# Patient Record
Sex: Male | Born: 1935 | Race: White | Hispanic: No | Marital: Married | Smoking: Former smoker
Health system: Southern US, Community
[De-identification: ages and names within clinical notes are randomized; demographics above are authoritative.]

## PROBLEM LIST (undated history)

## (undated) DIAGNOSIS — G8929 Other chronic pain: Secondary | ICD-10-CM

## (undated) DIAGNOSIS — I219 Acute myocardial infarction, unspecified: Secondary | ICD-10-CM

## (undated) DIAGNOSIS — N281 Cyst of kidney, acquired: Secondary | ICD-10-CM

## (undated) DIAGNOSIS — R0602 Shortness of breath: Secondary | ICD-10-CM

## (undated) DIAGNOSIS — N400 Enlarged prostate without lower urinary tract symptoms: Secondary | ICD-10-CM

## (undated) DIAGNOSIS — I639 Cerebral infarction, unspecified: Secondary | ICD-10-CM

## (undated) DIAGNOSIS — I10 Essential (primary) hypertension: Secondary | ICD-10-CM

## (undated) DIAGNOSIS — M1990 Unspecified osteoarthritis, unspecified site: Secondary | ICD-10-CM

## (undated) DIAGNOSIS — F341 Dysthymic disorder: Secondary | ICD-10-CM

## (undated) DIAGNOSIS — I2510 Atherosclerotic heart disease of native coronary artery without angina pectoris: Secondary | ICD-10-CM

## (undated) HISTORY — DX: Cerebral infarction, unspecified: I63.9

## (undated) HISTORY — DX: Acute myocardial infarction, unspecified: I21.9

## (undated) HISTORY — PX: CATARACT EXTRACTION W/ INTRAOCULAR LENS  IMPLANT, BILATERAL: SHX1307

## (undated) HISTORY — PX: OTHER SURGICAL HISTORY: SHX169

## (undated) HISTORY — PX: PERCUTANEOUS CORONARY STENT INTERVENTION (PCI-S): SHX6016

## (undated) HISTORY — PX: HERNIA REPAIR: SHX51

## (undated) HISTORY — DX: Atherosclerotic heart disease of native coronary artery without angina pectoris: I25.10

---

## 2010-08-13 DIAGNOSIS — I259 Chronic ischemic heart disease, unspecified: Secondary | ICD-10-CM

## 2010-08-13 HISTORY — DX: Chronic ischemic heart disease, unspecified: I25.9

## 2014-02-14 DIAGNOSIS — F32A Depression, unspecified: Secondary | ICD-10-CM

## 2014-02-14 DIAGNOSIS — N400 Enlarged prostate without lower urinary tract symptoms: Secondary | ICD-10-CM

## 2014-02-14 DIAGNOSIS — F39 Unspecified mood [affective] disorder: Secondary | ICD-10-CM | POA: Insufficient documentation

## 2014-02-14 DIAGNOSIS — F0153 Vascular dementia, unspecified severity, with mood disturbance: Secondary | ICD-10-CM | POA: Insufficient documentation

## 2014-02-14 DIAGNOSIS — E785 Hyperlipidemia, unspecified: Secondary | ICD-10-CM

## 2014-02-14 HISTORY — DX: Benign prostatic hyperplasia without lower urinary tract symptoms: N40.0

## 2014-02-14 HISTORY — DX: Depression, unspecified: F32.A

## 2014-02-14 HISTORY — DX: Hyperlipidemia, unspecified: E78.5

## 2014-03-02 DIAGNOSIS — N281 Cyst of kidney, acquired: Secondary | ICD-10-CM | POA: Insufficient documentation

## 2014-03-02 DIAGNOSIS — K760 Fatty (change of) liver, not elsewhere classified: Secondary | ICD-10-CM

## 2014-03-02 HISTORY — DX: Fatty (change of) liver, not elsewhere classified: K76.0

## 2014-03-12 DIAGNOSIS — I10 Essential (primary) hypertension: Secondary | ICD-10-CM

## 2014-03-12 DIAGNOSIS — R42 Dizziness and giddiness: Secondary | ICD-10-CM

## 2014-03-12 HISTORY — DX: Essential (primary) hypertension: I10

## 2014-03-12 HISTORY — DX: Dizziness and giddiness: R42

## 2014-03-29 DIAGNOSIS — J309 Allergic rhinitis, unspecified: Secondary | ICD-10-CM

## 2014-03-29 HISTORY — DX: Allergic rhinitis, unspecified: J30.9

## 2016-01-06 DIAGNOSIS — M20099 Other deformity of finger(s), unspecified finger(s): Secondary | ICD-10-CM

## 2016-01-06 HISTORY — DX: Other deformity of finger(s), unspecified finger(s): M20.099

## 2016-07-15 DIAGNOSIS — M2550 Pain in unspecified joint: Secondary | ICD-10-CM

## 2016-07-15 DIAGNOSIS — M720 Palmar fascial fibromatosis [Dupuytren]: Secondary | ICD-10-CM

## 2016-07-15 HISTORY — DX: Palmar fascial fibromatosis (dupuytren): M72.0

## 2016-07-15 HISTORY — DX: Pain in unspecified joint: M25.50

## 2016-08-07 DIAGNOSIS — D0361 Melanoma in situ of right upper limb, including shoulder: Secondary | ICD-10-CM

## 2016-08-07 HISTORY — DX: Melanoma in situ of right upper limb, including shoulder: D03.61

## 2016-08-18 DIAGNOSIS — K219 Gastro-esophageal reflux disease without esophagitis: Secondary | ICD-10-CM

## 2016-08-18 HISTORY — DX: Gastro-esophageal reflux disease without esophagitis: K21.9

## 2016-10-01 DIAGNOSIS — Z9181 History of falling: Secondary | ICD-10-CM

## 2016-10-01 HISTORY — DX: History of falling: Z91.81

## 2017-09-01 DIAGNOSIS — G8929 Other chronic pain: Secondary | ICD-10-CM

## 2017-09-01 DIAGNOSIS — M6281 Muscle weakness (generalized): Secondary | ICD-10-CM | POA: Insufficient documentation

## 2017-09-01 HISTORY — DX: Other chronic pain: G89.29

## 2017-09-01 HISTORY — DX: Muscle weakness (generalized): M62.81

## 2017-11-23 DIAGNOSIS — G5793 Unspecified mononeuropathy of bilateral lower limbs: Secondary | ICD-10-CM

## 2017-11-23 HISTORY — DX: Unspecified mononeuropathy of bilateral lower limbs: G57.93

## 2018-09-14 DIAGNOSIS — E7800 Pure hypercholesterolemia, unspecified: Secondary | ICD-10-CM

## 2018-09-14 HISTORY — DX: Pure hypercholesterolemia, unspecified: E78.00

## 2018-11-02 DIAGNOSIS — N401 Enlarged prostate with lower urinary tract symptoms: Secondary | ICD-10-CM | POA: Insufficient documentation

## 2018-11-02 HISTORY — DX: Benign prostatic hyperplasia with lower urinary tract symptoms: N40.1

## 2019-08-26 ENCOUNTER — Encounter (HOSPITAL_COMMUNITY): Payer: Self-pay | Admitting: Primary Care

## 2019-08-26 ENCOUNTER — Other Ambulatory Visit: Payer: Self-pay

## 2019-08-26 ENCOUNTER — Observation Stay (HOSPITAL_COMMUNITY): Payer: Medicare HMO

## 2019-08-26 ENCOUNTER — Emergency Department (HOSPITAL_COMMUNITY): Payer: Medicare HMO

## 2019-08-26 ENCOUNTER — Inpatient Hospital Stay (HOSPITAL_COMMUNITY)
Admission: EM | Admit: 2019-08-26 | Discharge: 2019-08-28 | DRG: 041 | Disposition: A | Discharge status: Home Health | Payer: Medicare HMO | Attending: Internal Medicine | Admitting: Internal Medicine

## 2019-08-26 DIAGNOSIS — Z955 Presence of coronary angioplasty implant and graft: Secondary | ICD-10-CM

## 2019-08-26 DIAGNOSIS — R4781 Slurred speech: Secondary | ICD-10-CM | POA: Diagnosis present

## 2019-08-26 DIAGNOSIS — I63412 Cerebral infarction due to embolism of left middle cerebral artery: Secondary | ICD-10-CM

## 2019-08-26 DIAGNOSIS — Z79899 Other long term (current) drug therapy: Secondary | ICD-10-CM

## 2019-08-26 DIAGNOSIS — R202 Paresthesia of skin: Secondary | ICD-10-CM | POA: Diagnosis present

## 2019-08-26 DIAGNOSIS — M1990 Unspecified osteoarthritis, unspecified site: Secondary | ICD-10-CM | POA: Diagnosis present

## 2019-08-26 DIAGNOSIS — I69359 Hemiplegia and hemiparesis following cerebral infarction affecting unspecified side: Secondary | ICD-10-CM

## 2019-08-26 DIAGNOSIS — I6381 Other cerebral infarction due to occlusion or stenosis of small artery: Principal | ICD-10-CM | POA: Diagnosis present

## 2019-08-26 DIAGNOSIS — M25561 Pain in right knee: Secondary | ICD-10-CM | POA: Diagnosis present

## 2019-08-26 DIAGNOSIS — E669 Obesity, unspecified: Secondary | ICD-10-CM | POA: Diagnosis present

## 2019-08-26 DIAGNOSIS — I2510 Atherosclerotic heart disease of native coronary artery without angina pectoris: Secondary | ICD-10-CM | POA: Diagnosis present

## 2019-08-26 DIAGNOSIS — E785 Hyperlipidemia, unspecified: Secondary | ICD-10-CM | POA: Diagnosis present

## 2019-08-26 DIAGNOSIS — I639 Cerebral infarction, unspecified: Secondary | ICD-10-CM | POA: Diagnosis not present

## 2019-08-26 DIAGNOSIS — N400 Enlarged prostate without lower urinary tract symptoms: Secondary | ICD-10-CM | POA: Diagnosis present

## 2019-08-26 DIAGNOSIS — Z7982 Long term (current) use of aspirin: Secondary | ICD-10-CM

## 2019-08-26 DIAGNOSIS — R29702 NIHSS score 2: Secondary | ICD-10-CM | POA: Diagnosis present

## 2019-08-26 DIAGNOSIS — G8929 Other chronic pain: Secondary | ICD-10-CM | POA: Diagnosis present

## 2019-08-26 DIAGNOSIS — Z6830 Body mass index (BMI) 30.0-30.9, adult: Secondary | ICD-10-CM

## 2019-08-26 DIAGNOSIS — Z20828 Contact with and (suspected) exposure to other viral communicable diseases: Secondary | ICD-10-CM | POA: Diagnosis present

## 2019-08-26 DIAGNOSIS — Z823 Family history of stroke: Secondary | ICD-10-CM

## 2019-08-26 DIAGNOSIS — M25562 Pain in left knee: Secondary | ICD-10-CM | POA: Diagnosis present

## 2019-08-26 DIAGNOSIS — I10 Essential (primary) hypertension: Secondary | ICD-10-CM | POA: Diagnosis present

## 2019-08-26 HISTORY — DX: Other chronic pain: G89.29

## 2019-08-26 HISTORY — DX: Unspecified osteoarthritis, unspecified site: M19.90

## 2019-08-26 HISTORY — DX: Essential (primary) hypertension: I10

## 2019-08-26 HISTORY — DX: Benign prostatic hyperplasia without lower urinary tract symptoms: N40.0

## 2019-08-26 HISTORY — DX: Cyst of kidney, acquired: N28.1

## 2019-08-26 HISTORY — DX: Dysthymic disorder: F34.1

## 2019-08-26 LAB — DIFFERENTIAL
Abs Immature Granulocytes: 0.02 10*3/uL (ref 0.00–0.07)
Basophils Absolute: 0 10*3/uL (ref 0.0–0.1)
Basophils Relative: 1 %
Eosinophils Absolute: 0.1 10*3/uL (ref 0.0–0.5)
Eosinophils Relative: 2 %
Immature Granulocytes: 0 %
Lymphocytes Relative: 32 %
Lymphs Abs: 1.9 10*3/uL (ref 0.7–4.0)
Monocytes Absolute: 0.6 10*3/uL (ref 0.1–1.0)
Monocytes Relative: 9 %
Neutro Abs: 3.4 10*3/uL (ref 1.7–7.7)
Neutrophils Relative %: 56 %

## 2019-08-26 LAB — I-STAT CHEM 8, ED
BUN: 16 mg/dL (ref 8–23)
Calcium, Ion: 1.05 mmol/L — ABNORMAL LOW (ref 1.15–1.40)
Chloride: 107 mmol/L (ref 98–111)
Creatinine, Ser: 0.9 mg/dL (ref 0.61–1.24)
Glucose, Bld: 112 mg/dL — ABNORMAL HIGH (ref 70–99)
HCT: 41 % (ref 39.0–52.0)
Hemoglobin: 13.9 g/dL (ref 13.0–17.0)
Potassium: 4 mmol/L (ref 3.5–5.1)
Sodium: 142 mmol/L (ref 135–145)
TCO2: 23 mmol/L (ref 22–32)

## 2019-08-26 LAB — URINALYSIS, ROUTINE W REFLEX MICROSCOPIC
Bilirubin Urine: NEGATIVE
Glucose, UA: NEGATIVE mg/dL
Hgb urine dipstick: NEGATIVE
Ketones, ur: NEGATIVE mg/dL
Leukocytes,Ua: NEGATIVE
Nitrite: NEGATIVE
Protein, ur: NEGATIVE mg/dL
Specific Gravity, Urine: 1.006 (ref 1.005–1.030)
pH: 7 (ref 5.0–8.0)

## 2019-08-26 LAB — COMPREHENSIVE METABOLIC PANEL
ALT: 17 U/L (ref 0–44)
AST: 24 U/L (ref 15–41)
Albumin: 3.7 g/dL (ref 3.5–5.0)
Alkaline Phosphatase: 69 U/L (ref 38–126)
Anion gap: 9 (ref 5–15)
BUN: 14 mg/dL (ref 8–23)
CO2: 25 mmol/L (ref 22–32)
Calcium: 8.7 mg/dL — ABNORMAL LOW (ref 8.9–10.3)
Chloride: 107 mmol/L (ref 98–111)
Creatinine, Ser: 1.01 mg/dL (ref 0.61–1.24)
GFR calc Af Amer: >60 mL/min (ref >60)
GFR calc non Af Amer: >60 mL/min (ref >60)
Glucose, Bld: 116 mg/dL — ABNORMAL HIGH (ref 70–99)
Potassium: 4.1 mmol/L (ref 3.5–5.1)
Sodium: 141 mmol/L (ref 135–145)
Total Bilirubin: 1.1 mg/dL (ref 0.3–1.2)
Total Protein: 6.1 g/dL — ABNORMAL LOW (ref 6.5–8.1)

## 2019-08-26 LAB — SARS CORONAVIRUS 2 (TAT 6-24 HRS): SARS Coronavirus 2: NEGATIVE

## 2019-08-26 LAB — RAPID URINE DRUG SCREEN, HOSP PERFORMED
Amphetamines: NOT DETECTED
Barbiturates: NOT DETECTED
Benzodiazepines: NOT DETECTED
Cocaine: NOT DETECTED
Opiates: NOT DETECTED
Tetrahydrocannabinol: NOT DETECTED

## 2019-08-26 LAB — CBC
HCT: 40.9 % (ref 39.0–52.0)
Hemoglobin: 14.2 g/dL (ref 13.0–17.0)
MCH: 32.1 pg (ref 26.0–34.0)
MCHC: 34.7 g/dL (ref 30.0–36.0)
MCV: 92.5 fL (ref 80.0–100.0)
Platelets: 193 10*3/uL (ref 150–400)
RBC: 4.42 MIL/uL (ref 4.22–5.81)
RDW: 13.6 % (ref 11.5–15.5)
WBC: 6 10*3/uL (ref 4.0–10.5)
nRBC: 0 % (ref 0.0–0.2)

## 2019-08-26 LAB — APTT: aPTT: 29 seconds (ref 24–36)

## 2019-08-26 LAB — CBG MONITORING, ED: Glucose-Capillary: 104 mg/dL — ABNORMAL HIGH (ref 70–99)

## 2019-08-26 LAB — ETHANOL: Alcohol, Ethyl (B): <10 mg/dL (ref <10)

## 2019-08-26 LAB — PROTIME-INR
INR: 1.2 (ref 0.8–1.2)
Prothrombin Time: 15.1 seconds (ref 11.4–15.2)

## 2019-08-26 MED ORDER — ATORVASTATIN CALCIUM 80 MG PO TABS
80.0000 mg | ORAL_TABLET | Freq: Every day | ORAL | Status: DC
  Administered 2019-08-26: 23:00:00 80 mg via ORAL
  Filled 2019-08-26: qty 1

## 2019-08-26 MED ORDER — STROKE: EARLY STAGES OF RECOVERY BOOK
Freq: Once | Status: AC
  Administered 2019-08-26: 23:00:00
  Filled 2019-08-26: qty 1

## 2019-08-26 MED ORDER — ACETAMINOPHEN 325 MG PO TABS
650.0000 mg | ORAL_TABLET | ORAL | Status: DC | PRN
  Administered 2019-08-28: 650 mg via ORAL
  Filled 2019-08-26: qty 2

## 2019-08-26 MED ORDER — TAMSULOSIN HCL 0.4 MG PO CAPS
0.4000 mg | ORAL_CAPSULE | Freq: Every day | ORAL | Status: DC
  Administered 2019-08-27 – 2019-08-28 (×2): 0.4 mg via ORAL
  Filled 2019-08-26 (×2): qty 1

## 2019-08-26 MED ORDER — TRAZODONE HCL 50 MG PO TABS
50.0000 mg | ORAL_TABLET | Freq: Once | ORAL | Status: AC
  Administered 2019-08-26: 23:00:00 50 mg via ORAL
  Filled 2019-08-26: qty 1

## 2019-08-26 MED ORDER — ACETAMINOPHEN 160 MG/5ML PO SOLN: 650.0000 mg | ORAL | Status: DC | PRN

## 2019-08-26 MED ORDER — ASPIRIN 325 MG PO TABS
325.0000 mg | ORAL_TABLET | Freq: Every day | ORAL | Status: DC
  Administered 2019-08-26 – 2019-08-27 (×2): 325 mg via ORAL
  Filled 2019-08-26 (×2): qty 1

## 2019-08-26 MED ORDER — IOHEXOL 350 MG/ML SOLN
100.0000 mL | Freq: Once | INTRAVENOUS | Status: AC | PRN
  Administered 2019-08-26: 13:00:00 100 mL via INTRAVENOUS

## 2019-08-26 MED ORDER — FINASTERIDE 5 MG PO TABS
5.0000 mg | ORAL_TABLET | Freq: Every day | ORAL | Status: DC
  Administered 2019-08-27 – 2019-08-28 (×2): 5 mg via ORAL
  Filled 2019-08-26 (×2): qty 1

## 2019-08-26 MED ORDER — ENOXAPARIN SODIUM 40 MG/0.4ML ~~LOC~~ SOLN
40.0000 mg | SUBCUTANEOUS | Status: DC
  Administered 2019-08-27: 40 mg via SUBCUTANEOUS
  Filled 2019-08-26: qty 0.4

## 2019-08-26 MED ORDER — CLOPIDOGREL BISULFATE 75 MG PO TABS
75.0000 mg | ORAL_TABLET | Freq: Every day | ORAL | Status: DC
  Administered 2019-08-26 – 2019-08-28 (×3): 75 mg via ORAL
  Filled 2019-08-26 (×3): qty 1

## 2019-08-26 MED ORDER — SENNOSIDES-DOCUSATE SODIUM 8.6-50 MG PO TABS: 1.0000 | ORAL_TABLET | Freq: Every evening | ORAL | Status: DC | PRN

## 2019-08-26 MED ORDER — METOPROLOL TARTRATE 12.5 MG HALF TABLET
12.5000 mg | ORAL_TABLET | Freq: Two times a day (BID) | ORAL | Status: DC
  Administered 2019-08-26 – 2019-08-28 (×4): 12.5 mg via ORAL
  Filled 2019-08-26 (×4): qty 1

## 2019-08-26 MED ORDER — ACETAMINOPHEN 650 MG RE SUPP: 650.0000 mg | RECTAL | Status: DC | PRN

## 2019-08-26 MED ORDER — PANTOPRAZOLE SODIUM 40 MG PO TBEC
40.0000 mg | DELAYED_RELEASE_TABLET | Freq: Every day | ORAL | Status: DC
  Administered 2019-08-27 – 2019-08-28 (×2): 40 mg via ORAL
  Filled 2019-08-26 (×2): qty 1

## 2019-08-26 MED ORDER — ASPIRIN 300 MG RE SUPP: 300.0000 mg | Freq: Every day | RECTAL | Status: DC

## 2019-08-26 NOTE — ED Triage Notes (Signed)
Pt LSN at 0100. Pt woke up this morning at 0800 and wife noted difference in speech, slurred. Right side facial droop noted. Pt alert and oriented X4.

## 2019-08-26 NOTE — H&P (Signed)
History and Physical    Yi Haugan ION:[id number] DOB: 1930/10/22 DOA: 08/26/2019  PCP: System, Pcp Not In  Patient coming from:  Home  I have personally briefly reviewed patient's old medical records in [place]  Chief Complaint: Slurred speech and right facial droop started this morning.  HPI: Marvon Shillingburg is a 85 y.o. male with medical history significant of hypertension, dysthymia, coronary artery disease status post stents, hyperlipidemia, BPH, brought by EMS to emergency department for the concern of slurred speech and right facial droop which started suddenly this morning at 8:00.  Patient reports that he was doing fine last night and when he woke up this morning he was having right facial droop, slurred speech denies association with headache, blurry vision, lightheadedness, dizziness, numbness weakness tingling sensation in extremities, previous history of stroke/TIA, chest pain, shortness of breath, palpitation, leg swelling, nausea, vomiting, abdominal pain, urinary or bowel problems.  He lives with his wife at home.  Does not use walker or cane for ambulation.  He is independent on daily life activities.  He denies smoking, alcohol, illicit drug use.  ED Course: CT angiogram of head and neck was obtained in the ED which came back negative for acute findings.  MRI brain without contrast ordered which is pending.  Basic labs such as CBC, CMP, all came back within normal limits.  UA, UDS: Negative, ethanol level: WNL.  Neurology was consulted by EDP for further evaluation and management.  Review of Systems: As per HPI otherwise negative.    Past Medical History:  Diagnosis Date  . Arthritis   . Bilateral chronic knee pain   . BPH (benign prostatic hyperplasia)   . Dysthymia   . HTN (hypertension)   . Renal cyst     Past Surgical History:  Procedure Laterality Date  . PERCUTANEOUS CORONARY STENT INTERVENTION (PCI-S)       has no history on file for tobacco,  alcohol, and drug.  No Known Allergies  Family History  Problem Relation Age of Onset  . Diabetes Mother   . Stroke Father     Prior to Admission medications   Medication Sig Start Date End Date Taking? Authorizing Provider  acetaminophen (TYLENOL) 650 MG CR tablet Take 650 mg by mouth every 8 (eight) hours as needed for pain.   Yes [provider]  aspirin EC 81 MG tablet Take 81 mg by mouth daily.   Yes [provider]  finasteride (PROSCAR) 5 MG tablet Take 5 mg by mouth daily.   Yes [provider]  lisinopril (ZESTRIL) 5 MG tablet Take 5 mg by mouth daily.   Yes [provider]  metoprolol tartrate (LOPRESSOR) 25 MG tablet Take 12.5 mg by mouth 2 (two) times daily.   Yes [provider]  omeprazole (PRILOSEC) 20 MG capsule Take 20 mg by mouth daily.   Yes [provider]  simvastatin (ZOCOR) 40 MG tablet Take 40 mg by mouth daily.   Yes [provider]  tamsulosin (FLOMAX) 0.4 MG CAPS capsule Take 0.4 mg by mouth daily.   Yes [provider]    Physical Exam: Vitals:   08/26/19 1115 08/26/19 1126 08/26/19 1130 08/26/19 1200  BP: (!) 193/92 (!) 193/92 (!) 181/89 (!) 172/77  Pulse: 64 60 (!) 57 63  Resp: 18 20 (!) 21 19  Temp:  97.6 F (36.4 C)    TempSrc:  Oral    SpO2: 96% 97% 96% 95%    Constitutional: NAD, calm,  comfortable Vitals:   08/26/19 1115 08/26/19 1126 08/26/19 1130 08/26/19 1200  BP: (!) 193/92 (!) 193/92 (!) 181/89 (!) 172/77  Pulse: 64 60 (!) 57 63  Resp: 18 20 (!) 21 19  Temp:  97.6 F (36.4 C)    TempSrc:  Oral    SpO2: 96% 97% 96% 95%   Constitutional: Alert and oriented x3, communicating well, tearful, not in acute distress.   Eyes: PERRL, lids and conjunctivae normal ENMT: Mucous membranes are moist. Posterior pharynx clear of any exudate or lesions.Normal dentition.  Neck: normal, supple, no masses, no thyromegaly Respiratory: clear to auscultation bilaterally, no  wheezing, no crackles. Normal respiratory effort. No accessory muscle use.  Cardiovascular: Regular rate and rhythm, no murmurs / rubs / gallops. No extremity edema. 2+ pedal pulses. No carotid bruits.  Abdomen: no tenderness, no masses palpated. No hepatosplenomegaly. Bowel sounds positive.  Musculoskeletal: no clubbing / cyanosis. No joint deformity upper and lower extremities. Good ROM, no contractures. Normal muscle tone.  Skin: no rashes, lesions, ulcers. No induration Neurologic: Right facial droop noted, smile asymmetric, sensation intact, DTR normal. Strength 5/5 in all 4.  Psychiatric: Normal judgment and insight. Alert and oriented x 3.    Labs on Admission: I have personally reviewed following labs and imaging studies  CBC: Recent Labs  Lab 08/26/19 1136 08/26/19 1141  WBC 6.0  --   NEUTROABS 3.4  --   HGB 14.2 13.9  HCT 40.9 41.0  MCV 92.5  --   PLT 193  --    Basic Metabolic Panel: Recent Labs  Lab 08/26/19 1136 08/26/19 1141  NA 141 142  K 4.1 4.0  CL 107 107  CO2 25  --   GLUCOSE 116* 112*  BUN 14 16  CREATININE 1.01 0.90  CALCIUM 8.7*  --    GFR: CrCl cannot be calculated (Unknown ideal weight.). Liver Function Tests: Recent Labs  Lab 08/26/19 1136  AST 24  ALT 17  ALKPHOS 69  BILITOT 1.1  PROT 6.1*  ALBUMIN 3.7   No results for input(s): LIPASE, AMYLASE in the last 168 hours. No results for input(s): AMMONIA in the last 168 hours. Coagulation Profile: Recent Labs  Lab 08/26/19 1136  INR 1.2   Cardiac Enzymes: No results for input(s): CKTOTAL, CKMB, CKMBINDEX, TROPONINI in the last 168 hours. BNP (last 3 results) No results for input(s): PROBNP in the last 8760 hours. HbA1C: No results for input(s): HGBA1C in the last 72 hours. CBG: Recent Labs  Lab 08/26/19 1126  GLUCAP 104*   Lipid Profile: No results for input(s): CHOL, HDL, LDLCALC, TRIG, CHOLHDL, LDLDIRECT in the last 72 hours. Thyroid Function Tests: No results for  input(s): TSH, T4TOTAL, FREET4, T3FREE, THYROIDAB in the last 72 hours. Anemia Panel: No results for input(s): VITAMINB12, FOLATE, FERRITIN, TIBC, IRON, RETICCTPCT in the last 72 hours. Urine analysis:    Component Value Date/Time   COLORURINE STRAW (A) 08/26/2019 1135   APPEARANCEUR CLEAR 08/26/2019 1135   LABSPEC 1.006 08/26/2019 1135   PHURINE 7.0 08/26/2019 1135   GLUCOSEU NEGATIVE 08/26/2019 1135   HGBUR NEGATIVE 08/26/2019 1135   BILIRUBINUR NEGATIVE 08/26/2019 1135   KETONESUR NEGATIVE 08/26/2019 1135   PROTEINUR NEGATIVE 08/26/2019 1135   NITRITE NEGATIVE 08/26/2019 1135   LEUKOCYTESUR NEGATIVE 08/26/2019 1135    Radiological Exams on Admission: Ct Angio Head W Or Wo Contrast  Result Date: 08/26/2019 CLINICAL DATA:  Right facial droop and slurred speech. EXAM: CT ANGIOGRAPHY HEAD AND NECK TECHNIQUE: Multidetector CT  imaging of the head and neck was performed using the standard protocol during bolus administration of intravenous contrast. Multiplanar CT image reconstructions and MIPs were obtained to evaluate the vascular anatomy. Carotid stenosis measurements (when applicable) are obtained utilizing NASCET criteria, using the distal internal carotid diameter as the denominator. CONTRAST:  100mL OMNIPAQUE IOHEXOL 350 MG/ML SOLN COMPARISON:  Head CT 04/09/2012 FINDINGS: CT HEAD FINDINGS Brain: There is no evidence of acute infarct, intracranial hemorrhage, mass, midline shift, or extra-axial fluid collection. There are small chronic infarcts in the left cerebellum. Cerebral white matter hypodensities are nonspecific but compatible with mild chronic small vessel ischemic disease. The ventricles and sulci are within normal limits for age. Vascular: Calcified atherosclerosis at the skull base. No hyperdense vessel. Skull: No fracture or focal osseous lesion. Sinuses: Mild mucosal thickening in the right greater than left maxillary sinuses. Clear mastoid air cells. Orbits: Bilateral cataract  extraction. Review of the MIP images confirms the above findings CTA NECK FINDINGS Aortic arch: Normal variant 4 vessel aortic arch with the left vertebral artery arising from the arch. Mild atherosclerosis without significant arch vessel origin stenosis. Right carotid system: Patent with mild atherosclerotic plaque most notable at the carotid bifurcation. No evidence of significant stenosis or dissection. Tortuous mid to distal cervical ICA. Left carotid system: Patent with mild atherosclerotic plaque most notable at the carotid bifurcation. No evidence of significant stenosis or dissection. Tortuous mid cervical ICA. Vertebral arteries: Patent with the right being slightly larger than the left. Mild calcified plaque at the right vertebral artery origin. No evidence of significant stenosis or dissection. Skeleton: Moderate diffuse cervical disc degeneration. Other neck: No evidence of cervical lymphadenopathy or mass. Upper chest: Mild motion artifact through the lung apices. No consolidation. Review of the MIP images confirms the above findings CTA HEAD FINDINGS Anterior circulation: The internal carotid arteries are patent from skull base to carotid termini with mild atherosclerotic plaque bilaterally not resulting in significant stenosis. ACAs and MCAs are patent with mild irregularity predominantly involving the branch vessels but no evidence of proximal branch occlusion or flow limiting proximal stenosis. There is a mild proximal left M1 stenosis. No aneurysm is identified. Posterior circulation: The intracranial vertebral arteries are patent to the basilar with mild nonstenotic plaque bilaterally. Patent PICA and SCA origins are visualized bilaterally. The basilar artery is widely patent. There is a large left posterior communicating artery with hypoplastic left P1 segment. The PCAs are patent with mild irregularity bilaterally but no significant proximal stenosis. No aneurysm is identified. Venous sinuses:  Patent. Anatomic variants: Fetal left PCA. Review of the MIP images confirms the above findings IMPRESSION: 1. No evidence of acute intracranial abnormality. 2. 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 3. Mild atherosclerosis in the head and neck without large vessel occlusion or significant proximal stenosis. Electronically Signed   By: Sebastian AcheAllen  Grady M.D.   On: 08/26/2019 13:05   Ct Angio Neck W And/or Wo Contrast  Result Date: 08/26/2019 CLINICAL DATA:  Right facial droop and slurred speech. EXAM: CT ANGIOGRAPHY HEAD AND NECK TECHNIQUE: Multidetector CT imaging of the head and neck was performed using the standard protocol during bolus administration of intravenous contrast. Multiplanar CT image reconstructions and MIPs were obtained to evaluate the vascular anatomy. Carotid stenosis measurements (when applicable) are obtained utilizing NASCET criteria, using the distal internal carotid diameter as the denominator. CONTRAST:  100mL OMNIPAQUE IOHEXOL 350 MG/ML SOLN COMPARISON:  Head CT 04/09/2012 FINDINGS: CT HEAD FINDINGS Brain: There  is no evidence of acute infarct, intracranial hemorrhage, mass, midline shift, or extra-axial fluid collection. There are small chronic infarcts in the left cerebellum. Cerebral white matter hypodensities are nonspecific but compatible with mild chronic small vessel ischemic disease. The ventricles and sulci are within normal limits for age. Vascular: Calcified atherosclerosis at the skull base. No hyperdense vessel. Skull: No fracture or focal osseous lesion. Sinuses: Mild mucosal thickening in the right greater than left maxillary sinuses. Clear mastoid air cells. Orbits: Bilateral cataract extraction. Review of the MIP images confirms the above findings CTA NECK FINDINGS Aortic arch: Normal variant 4 vessel aortic arch with the left vertebral artery arising from the arch. Mild atherosclerosis without significant arch vessel origin  stenosis. Right carotid system: Patent with mild atherosclerotic plaque most notable at the carotid bifurcation. No evidence of significant stenosis or dissection. Tortuous mid to distal cervical ICA. Left carotid system: Patent with mild atherosclerotic plaque most notable at the carotid bifurcation. No evidence of significant stenosis or dissection. Tortuous mid cervical ICA. Vertebral arteries: Patent with the right being slightly larger than the left. Mild calcified plaque at the right vertebral artery origin. No evidence of significant stenosis or dissection. Skeleton: Moderate diffuse cervical disc degeneration. Other neck: No evidence of cervical lymphadenopathy or mass. Upper chest: Mild motion artifact through the lung apices. No consolidation. Review of the MIP images confirms the above findings CTA HEAD FINDINGS Anterior circulation: The internal carotid arteries are patent from skull base to carotid termini with mild atherosclerotic plaque bilaterally not resulting in significant stenosis. ACAs and MCAs are patent with mild irregularity predominantly involving the branch vessels but no evidence of proximal branch occlusion or flow limiting proximal stenosis. There is a mild proximal left M1 stenosis. No aneurysm is identified. Posterior circulation: The intracranial vertebral arteries are patent to the basilar with mild nonstenotic plaque bilaterally. Patent PICA and SCA origins are visualized bilaterally. The basilar artery is widely patent. There is a large left posterior communicating artery with hypoplastic left P1 segment. The PCAs are patent with mild irregularity bilaterally but no significant proximal stenosis. No aneurysm is identified. Venous sinuses: Patent. Anatomic variants: Fetal left PCA. Review of the MIP images confirms the above findings IMPRESSION: 1. No evidence of acute intracranial abnormality. 2. 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 3.  Mild atherosclerosis in the head and neck without large vessel occlusion or significant proximal stenosis. Electronically Signed   By: Sebastian AcheAllen  Grady M.D.   On: 08/26/2019 13:05    EKG: Normal sinus rhythm, normal axis, no acute ST-T wave changes noted.  Assessment/Plan Active Problems:   Stroke [facility](HCC)   Essential hypertension   BPH (benign prostatic hyperplasia)   Hyperlipidemia     Stroke/TIA: -Patient presented with slurred speech and right-sided facial droop.  Has history of hypertension and hyperlipidemia. -TPA not given as patient presented outside of window -CT angiogram of head and neck: Negative for acute findings. -Placed patient under observation. -MRI brain without contrast ordered and is pending. -Transthoracic echo and ultrasound carotid ordered and is pending -On telemetry. -Will allow permissive hypertension up to 220/110 for next 24 to 48 hours. -We will keep him n.p.o. until he passes bedside swallow evaluation. -Consulted PT/OT/ST -Seizure, fall, aspiration precautions -Evaluated by neurology-appreciate help -Check A1c and lipid panel.  Started on aspirin 325 mg once daily, Plavix 75 mg once daily and atorvastatin 80 mg once daily.  Coronary artery disease status post stents: Stable -Patient denies ACS symptoms. -  Continue aspirin, statin, hold metoprolol for now.  Hypertension: -We will hold home blood pressure medications at this time to allow permissive hypertension -Monitor blood pressure closely.  Hyperlipidemia: Check lipid panel -Started on atorvastatin 80 mg once daily.  BPH: Stable -Continue home medicines Flomax and Proscar   DVT prophylaxis: Lovenox Code Status: Full code Family Communication: None present at bedside.  Plan of care discussed with patient in length and he verbalized understanding and agreed with it. Disposition Plan: TBD Consults called: Neurology Dr Aroor Admission status: Observation   Ollen Bowlinka R Camey Edell MD [facility] Pager [phone]  If 7PM-7AM, please contact night-coverage [email or web address] Password [email or web address]  08/26/2019, 2:30 PM

## 2019-08-26 NOTE — ED Provider Notes (Signed)
[facility] EMERGENCY DEPARTMENT Provider Note   CSN: [id number] Arrival date & time: 08/26/19  1106     History   Chief Complaint Chief Complaint  Patient presents with  . Code Stroke    HPI Michael Kidd is a 85 y.o. male.     The history is provided by the patient and medical records. No language interpreter was used.   Michael Kidd is a 85 y.o. male who presents to the Emergency Department for evaluation of strokelike symptoms.  Patient states that he went to bed in his usual state of health.  He woke up at around 1 AM to let his dog out and felt fine.  He spoke with and saw his wife at that time who did not notice any speech changes and felt as if he was acting himself.  When he woke up around 8 this morning, he was having right facial tingling and wife noticed facial droop.  She also noticed garbled speech which concerned her.  Patient does endorse difficulty with his speech earlier today, but feels as if this has now resolved.  He does still feel as if the right side of his face is weak and numb.  During transport, EMS reported that he complained of a few seconds of visual changes.  Patient states this too has now resolved.  Past Medical History:  Diagnosis Date  . Arthritis   . Bilateral chronic knee pain   . BPH (benign prostatic hyperplasia)   . Dysthymia   . HTN (hypertension)   . Renal cyst     There are no active problems to display for this patient.      Home Medications    Prior to Admission medications   Medication Sig Start Date End Date Taking? Authorizing Provider  acetaminophen (TYLENOL) 650 MG CR tablet Take 650 mg by mouth every 8 (eight) hours as needed for pain.   Yes [provider]  aspirin EC 81 MG tablet Take 81 mg by mouth daily.   Yes [provider]  finasteride (PROSCAR) 5 MG tablet Take 5 mg by mouth daily.   Yes [provider]  lisinopril (ZESTRIL) 5 MG tablet Take 5 mg by mouth daily.   Yes  [provider]  metoprolol tartrate (LOPRESSOR) 25 MG tablet Take 12.5 mg by mouth 2 (two) times daily.   Yes [provider]  omeprazole (PRILOSEC) 20 MG capsule Take 20 mg by mouth daily.   Yes [provider]  simvastatin (ZOCOR) 40 MG tablet Take 40 mg by mouth daily.   Yes [provider]  tamsulosin (FLOMAX) 0.4 MG CAPS capsule Take 0.4 mg by mouth daily.   Yes [provider]    Family History No family history on file.  Social History Social History   Tobacco Use  . Smoking status: Not on file  Substance Use Topics  . Alcohol use: Not on file  . Drug use: Not on file     Allergies   Patient has no known allergies.   Review of Systems Review of Systems  Neurological: Positive for facial asymmetry, speech difficulty, weakness and numbness (Right face). Negative for dizziness and headaches.  All other systems reviewed and are negative.    Physical Exam Updated Vital Signs BP (!) 172/77   Pulse 63   Temp 97.6 F (36.4 C) (Oral)   Resp 19   SpO2 95%   Physical Exam Vitals signs and nursing note reviewed.  Constitutional:  General: He is not in acute distress.    Appearance: He is well-developed.  HENT:     Head: Normocephalic and atraumatic.  Neck:     Musculoskeletal: Neck supple.  Cardiovascular:     Rate and Rhythm: Normal rate and regular rhythm.     Heart sounds: Normal heart sounds. No murmur.  Pulmonary:     Effort: Pulmonary effort is normal. No respiratory distress.     Breath sounds: Normal breath sounds.  Abdominal:     General: There is no distension.     Palpations: Abdomen is soft.     Tenderness: There is no abdominal tenderness.  Skin:    General: Skin is warm and dry.  Neurological:     Mental Status: He is alert.     Comments: Alert, oriented, thought content appropriate. Speech is clear and goal oriented, able to follow commands.  Cranial Nerves:  II:  Peripheral visual fields  grossly normal, pupils equal, round, reactive to light III, IV, VI: EOM intact bilaterally, ptosis not present V,VII: smile asymmetric, eyes kept closed tightly against resistance, facial light touch sensation equal VIII: hearing grossly normal IX, X: symmetric soft palate movement, uvula elevates symmetrically  XI: bilateral shoulder shrug symmetric and strong XII: midline tongue extension 4/5 muscle strength in the right upper extremity, 5/5 muscle strength in left upper extremity and bilateral lower extremities including strong and equal grip strength and dorsiflexion/plantar flexion Sensory to light touch normal in all four extremities      ED Treatments / Results  Labs (all labs ordered are listed, but only abnormal results are displayed) Labs Reviewed  COMPREHENSIVE METABOLIC PANEL - Abnormal; Notable for the following components:      Result Value   Glucose, Bld 116 (*)    Calcium 8.7 (*)    Total Protein 6.1 (*)    All other components within normal limits  URINALYSIS, ROUTINE W REFLEX MICROSCOPIC - Abnormal; Notable for the following components:   Color, Urine STRAW (*)    All other components within normal limits  I-STAT CHEM 8, ED - Abnormal; Notable for the following components:   Glucose, Bld 112 (*)    Calcium, Ion 1.05 (*)    All other components within normal limits  CBG MONITORING, ED - Abnormal; Notable for the following components:   Glucose-Capillary 104 (*)    All other components within normal limits  ETHANOL  PROTIME-INR  APTT  CBC  DIFFERENTIAL  RAPID URINE DRUG SCREEN, HOSP PERFORMED    EKG None  Radiology Ct Angio Head W Or Wo Contrast  Result Date: 08/26/2019 CLINICAL DATA:  Right facial droop and slurred speech. EXAM: CT ANGIOGRAPHY HEAD AND NECK TECHNIQUE: Multidetector CT imaging of the head and neck was performed using the standard protocol during bolus administration of intravenous contrast. Multiplanar CT image reconstructions and MIPs  were obtained to evaluate the vascular anatomy. Carotid stenosis measurements (when applicable) are obtained utilizing NASCET criteria, using the distal internal carotid diameter as the denominator. CONTRAST:  100mL OMNIPAQUE IOHEXOL 350 MG/ML SOLN COMPARISON:  Head CT 04/09/2012 FINDINGS: CT HEAD FINDINGS Brain: There is no evidence of acute infarct, intracranial hemorrhage, mass, midline shift, or extra-axial fluid collection. There are small chronic infarcts in the left cerebellum. Cerebral white matter hypodensities are nonspecific but compatible with mild chronic small vessel ischemic disease. The ventricles and sulci are within normal limits for age. Vascular: Calcified atherosclerosis at the skull base. No hyperdense vessel. Skull: No fracture or focal osseous  lesion. Sinuses: Mild mucosal thickening in the right greater than left maxillary sinuses. Clear mastoid air cells. Orbits: Bilateral cataract extraction. Review of the MIP images confirms the above findings CTA NECK FINDINGS Aortic arch: Normal variant 4 vessel aortic arch with the left vertebral artery arising from the arch. Mild atherosclerosis without significant arch vessel origin stenosis. Right carotid system: Patent with mild atherosclerotic plaque most notable at the carotid bifurcation. No evidence of significant stenosis or dissection. Tortuous mid to distal cervical ICA. Left carotid system: Patent with mild atherosclerotic plaque most notable at the carotid bifurcation. No evidence of significant stenosis or dissection. Tortuous mid cervical ICA. Vertebral arteries: Patent with the right being slightly larger than the left. Mild calcified plaque at the right vertebral artery origin. No evidence of significant stenosis or dissection. Skeleton: Moderate diffuse cervical disc degeneration. Other neck: No evidence of cervical lymphadenopathy or mass. Upper chest: Mild motion artifact through the lung apices. No consolidation. Review of the MIP  images confirms the above findings CTA HEAD FINDINGS Anterior circulation: The internal carotid arteries are patent from skull base to carotid termini with mild atherosclerotic plaque bilaterally not resulting in significant stenosis. ACAs and MCAs are patent with mild irregularity predominantly involving the branch vessels but no evidence of proximal branch occlusion or flow limiting proximal stenosis. There is a mild proximal left M1 stenosis. No aneurysm is identified. Posterior circulation: The intracranial vertebral arteries are patent to the basilar with mild nonstenotic plaque bilaterally. Patent PICA and SCA origins are visualized bilaterally. The basilar artery is widely patent. There is a large left posterior communicating artery with hypoplastic left P1 segment. The PCAs are patent with mild irregularity bilaterally but no significant proximal stenosis. No aneurysm is identified. Venous sinuses: Patent. Anatomic variants: Fetal left PCA. Review of the MIP images confirms the above findings IMPRESSION: 1. No evidence of acute intracranial abnormality. 2. 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 3. Mild atherosclerosis in the head and neck without large vessel occlusion or significant proximal stenosis. Electronically Signed   By: Logan Bores M.D.   On: 08/26/2019 13:05   Ct Angio Neck W And/or Wo Contrast  Result Date: 08/26/2019 CLINICAL DATA:  Right facial droop and slurred speech. EXAM: CT ANGIOGRAPHY HEAD AND NECK TECHNIQUE: Multidetector CT imaging of the head and neck was performed using the standard protocol during bolus administration of intravenous contrast. Multiplanar CT image reconstructions and MIPs were obtained to evaluate the vascular anatomy. Carotid stenosis measurements (when applicable) are obtained utilizing NASCET criteria, using the distal internal carotid diameter as the denominator. CONTRAST:  138mL OMNIPAQUE IOHEXOL 350 MG/ML SOLN  COMPARISON:  Head CT 04/09/2012 FINDINGS: CT HEAD FINDINGS Brain: There is no evidence of acute infarct, intracranial hemorrhage, mass, midline shift, or extra-axial fluid collection. There are small chronic infarcts in the left cerebellum. Cerebral white matter hypodensities are nonspecific but compatible with mild chronic small vessel ischemic disease. The ventricles and sulci are within normal limits for age. Vascular: Calcified atherosclerosis at the skull base. No hyperdense vessel. Skull: No fracture or focal osseous lesion. Sinuses: Mild mucosal thickening in the right greater than left maxillary sinuses. Clear mastoid air cells. Orbits: Bilateral cataract extraction. Review of the MIP images confirms the above findings CTA NECK FINDINGS Aortic arch: Normal variant 4 vessel aortic arch with the left vertebral artery arising from the arch. Mild atherosclerosis without significant arch vessel origin stenosis. Right carotid system: Patent with mild atherosclerotic plaque most notable at  the carotid bifurcation. No evidence of significant stenosis or dissection. Tortuous mid to distal cervical ICA. Left carotid system: Patent with mild atherosclerotic plaque most notable at the carotid bifurcation. No evidence of significant stenosis or dissection. Tortuous mid cervical ICA. Vertebral arteries: Patent with the right being slightly larger than the left. Mild calcified plaque at the right vertebral artery origin. No evidence of significant stenosis or dissection. Skeleton: Moderate diffuse cervical disc degeneration. Other neck: No evidence of cervical lymphadenopathy or mass. Upper chest: Mild motion artifact through the lung apices. No consolidation. Review of the MIP images confirms the above findings CTA HEAD FINDINGS Anterior circulation: The internal carotid arteries are patent from skull base to carotid termini with mild atherosclerotic plaque bilaterally not resulting in significant stenosis. ACAs and MCAs  are patent with mild irregularity predominantly involving the branch vessels but no evidence of proximal branch occlusion or flow limiting proximal stenosis. There is a mild proximal left M1 stenosis. No aneurysm is identified. Posterior circulation: The intracranial vertebral arteries are patent to the basilar with mild nonstenotic plaque bilaterally. Patent PICA and SCA origins are visualized bilaterally. The basilar artery is widely patent. There is a large left posterior communicating artery with hypoplastic left P1 segment. The PCAs are patent with mild irregularity bilaterally but no significant proximal stenosis. No aneurysm is identified. Venous sinuses: Patent. Anatomic variants: Fetal left PCA. Review of the MIP images confirms the above findings IMPRESSION: 1. No evidence of acute intracranial abnormality. 2. 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 3. Mild atherosclerosis in the head and neck without large vessel occlusion or significant proximal stenosis. Electronically Signed   By: Sebastian AcheAllen  Grady M.D.   On: 08/26/2019 13:05    Procedures Procedures (including critical care time)  Medications Ordered in ED Medications  iohexol (OMNIPAQUE) 350 MG/ML injection 100 mL (100 mLs Intravenous Contrast Given 08/26/19 1247)     Initial Impression / Assessment and Plan / ED Course  I have reviewed the triage vital signs and the nursing notes.  Pertinent labs & imaging results that were available during my care of the patient were reviewed by me and considered in my medical decision making (see chart for details).       Carolynn Commenthomas Pawloski is a [age] y.o. male who presents to ED for difficulty with speech, right facial droop and right upper extremity weakness which he noticed upon awakening this morning.  Last known normal of 1 AM.  Upon arrival to ED, patient does have slight right facial droop and 4/5 muscle strength in the right upper extremity.  His speech is  clear.  He feels as if his speech symptoms have resolved.  Consulted neurology, Dr. Cameron Aliohr, who recommends a stat CT angios head and neck and neuro will evaluate the patient.  Labs reviewed and reassuring.  CT angios without acute intracranial abnormalities. As recommended by neurology, MRI ordered and patient admitted to hospitalist service for further stroke work up.   Patient discussed with Dr. Dalene SeltzerSchlossman who agrees with treatment plan.    Final Clinical Impressions(s) / ED Diagnoses   Final diagnoses:  Acute ischemic stroke [facility](HCC)    ED Discharge Orders    None       Ward, Chase PicketJaime Pilcher, PA-C 08/26/19 1355    Alvira MondaySchlossman, Erin, MD 08/27/19 [phone]

## 2019-08-26 NOTE — Progress Notes (Signed)
Patient arrived from ED via stretcher; oriented patient to room and unit routine; assisted patient to use the phone; fall safety reviewed; patient alert and oriented X4; cooperative.

## 2019-08-26 NOTE — Consult Note (Addendum)
NEURO HOSPITALIST  CONSULT   Requesting Physician: Dr. Billy Fischer    Chief Complaint: slurred speech, right facial droop  History obtained from:  Patient /  Chart    HPI:                                                                                                                                         Michael Kidd is an 85 y.o. male  With PMH HTN, dysthymia, BPH who presented to [facility] ED with c/o facial droop and slurred speech.    Patient states he went to bed early last night. He woke up about 0100 and took the dog out. At that time he felt fine and was completely normal. When he woke up again at 0800 he "didn't feel right". When he went to the bathroom he noticed facial droop. He then went to tell his wife. They called the doctor and they were referred to the emergency department. No prior stroke history noted. Patient does take ASA 81mg  daily. Denies  Anticoagulation, smoking, drinking alcohol or drug abuse. Denies any vision changes, CP, SOB, focal weakness.  ED course:  CTA: no LVO BP: 192/92 BG:112  Date last known well: 08-26-2019 Time last known well: 0100 tPA Given :no outside of window Modified Rankin: Rankin Score=1 NIHSS:2 ( facial droop and right arm drift   Past Medical History:  Diagnosis Date  . Arthritis   . Dysthymia   . HTN (hypertension)   . Renal cyst     The histories are not reviewed yet. Please review them in the "History" navigator section and refresh this [place].  No family history on file.       Social History:  has no history on file for tobacco, alcohol, and drug.  Allergies: No Known Allergies  Medications:                                                                                                                           No current facility-administered medications for this encounter.    Current Outpatient Medications  Medication Sig Dispense Refill  .  acetaminophen (TYLENOL) 650 MG  CR tablet Take 650 mg by mouth every 8 (eight) hours as needed for pain.    Marland Kitchen aspirin EC 81 MG tablet Take 81 mg by mouth daily.    . finasteride (PROSCAR) 5 MG tablet Take 5 mg by mouth daily.    Marland Kitchen lisinopril (ZESTRIL) 5 MG tablet Take 5 mg by mouth daily.    . metoprolol tartrate (LOPRESSOR) 25 MG tablet Take 12.5 mg by mouth 2 (two) times daily.    Marland Kitchen omeprazole (PRILOSEC) 20 MG capsule Take 20 mg by mouth daily.    . simvastatin (ZOCOR) 40 MG tablet Take 40 mg by mouth daily.    . tamsulosin (FLOMAX) 0.4 MG CAPS capsule Take 0.4 mg by mouth daily.      ROS:                                                                                                                                       ROS was performed and is negative except as noted in HPI   General Examination:                                                                                                      Blood pressure (!) 172/77, pulse 63, temperature 97.6 F (36.4 C), temperature source Oral, resp. rate 19, SpO2 95 %.  HEENT-  Normocephalic, no lesions, without obvious abnormality.  Normal external eye and conjunctiva. Cardiovascular-  pulses palpable throughout  Lungs- no excessive working breathing.  Saturations within normal limits on RA Extremities- Warm, dry and intact Musculoskeletal-no joint tenderness, deformity or swelling Skin-warm and dry, no hyperpigmentation, vitiligo, or suspicious lesions  Neurological Examination Mental Status: Alert, oriented, thought content appropriate.   Speech fluent without evidence of aphasia.  Able to follow  commands without difficulty. Cranial Nerves: II: Visual fields grossly normal,  III,IV, VI: ptosis not present, extra-ocular motions intact bilaterally, pupils equal, round, reactive to light and accommodation V,VII: smile asymmetric, right facial droop noted,  facial light touch sensation normal bilaterally VIII: hearing normal bilaterally IX,X: uvula rises midline XI:  bilateral shoulder shrug XII: midline tongue extension Motor: Right : Upper extremity   4+/5  Left:     Upper extremity   5/5  Lower extremity   5/5   Lower extremity   5/5 Tone and bulk:normal tone throughout; no atrophy noted Sensory:  light touch intact throughout, bilaterally Deep Tendon Reflexes: 2+ and symmetric biceps and patella Plantars: Right: downgoing  Left: downgoing Cerebellar: normal finger-to-nose,  Gait: deferred   Lab Results: Basic Metabolic Panel: Recent Labs  Lab 08/26/19 1141  NA 142  K 4.0  CL 107  GLUCOSE 112*  BUN 16  CREATININE 0.90    CBC: Recent Labs  Lab 08/26/19 1136 08/26/19 1141  WBC 6.0  --   NEUTROABS 3.4  --   HGB 14.2 13.9  HCT 40.9 41.0  MCV 92.5  --   PLT 193  --     CBG: Recent Labs  Lab 08/26/19 1126  GLUCAP 104*    Imaging: No results found.     Michael LucksJessica Williams, MSN, NP-C [facility] [phone]  08/26/2019, 12:08 PM   Attending physician note to follow with Assessment and plan .   Assessment: 85 y.o. male With PMH HTN, dysthymia, BPH who [facility] with c/o facial droop and slurred speech. CTA  No LVO, small chronic cerebellar infarcts. TPA not give d/t patient presenting outside of window.   Acute Ischemic Stroke   Stroke Risk Factors - hypertension    Recommendations: -- BP goal : Permissive HTN upto 220/110 mmHg (for 24-48 post admission ) # MRI of the brain without contrast #Transthoracic Echo  # Start patient on ASA 325mg  daily and Plavix 75 mg daily #Start or continue Atorvastatin 80 mg/other high intensity statin # HBAIC and Lipid profile # Telemetry monitoring # Frequent neuro checks # NPO until passes stroke swallow screen  Please page stroke NP  Or  PA  Or MD from 8am -4 pm  as this patient from this time will be  followed by the stroke.   You can look them up on [email or web address]  Password [email or web address]  NEUROHOSPITALIST ADDENDUM Performed a face to face diagnostic  evaluation.   I have reviewed the contents of history and physical exam as documented by PA/ARNP/Resident and agree with above documentation.  I have discussed and formulated the above plan as documented. Edits to the note have been made as needed.  85 year old male with past medical history of hypertension presents the ED with difficulty getting words out, slurred speech and facial droop.  Symptoms mostly resolved and patient only has mild right facial droop and mild upper extremity weakness.  NIHSS 2.  Not candidate for TPA as he is out of the window and mild deficits.  No LVO on CTA.  Recommend stroke work-up and admission.  Stroke team will follow    Sushanth Aroor MD [facility] [phone]   If 7pm to 7am, please call on call as listed on AMION.

## 2019-08-27 ENCOUNTER — Inpatient Hospital Stay (HOSPITAL_COMMUNITY): Payer: Medicare HMO

## 2019-08-27 ENCOUNTER — Encounter (HOSPITAL_COMMUNITY): Payer: Self-pay | Admitting: *Deleted

## 2019-08-27 DIAGNOSIS — I351 Nonrheumatic aortic (valve) insufficiency: Secondary | ICD-10-CM | POA: Diagnosis not present

## 2019-08-27 DIAGNOSIS — I63411 Cerebral infarction due to embolism of right middle cerebral artery: Secondary | ICD-10-CM | POA: Diagnosis not present

## 2019-08-27 DIAGNOSIS — I639 Cerebral infarction, unspecified: Secondary | ICD-10-CM

## 2019-08-27 DIAGNOSIS — M1990 Unspecified osteoarthritis, unspecified site: Secondary | ICD-10-CM | POA: Diagnosis present

## 2019-08-27 DIAGNOSIS — Z79899 Other long term (current) drug therapy: Secondary | ICD-10-CM | POA: Diagnosis not present

## 2019-08-27 DIAGNOSIS — Z7982 Long term (current) use of aspirin: Secondary | ICD-10-CM | POA: Diagnosis not present

## 2019-08-27 DIAGNOSIS — R202 Paresthesia of skin: Secondary | ICD-10-CM | POA: Diagnosis present

## 2019-08-27 DIAGNOSIS — Z955 Presence of coronary angioplasty implant and graft: Secondary | ICD-10-CM | POA: Diagnosis not present

## 2019-08-27 DIAGNOSIS — R29702 NIHSS score 2: Secondary | ICD-10-CM | POA: Diagnosis present

## 2019-08-27 DIAGNOSIS — M25562 Pain in left knee: Secondary | ICD-10-CM | POA: Diagnosis present

## 2019-08-27 DIAGNOSIS — I6381 Other cerebral infarction due to occlusion or stenosis of small artery: Secondary | ICD-10-CM | POA: Diagnosis present

## 2019-08-27 DIAGNOSIS — I6389 Other cerebral infarction: Secondary | ICD-10-CM | POA: Diagnosis not present

## 2019-08-27 DIAGNOSIS — M25561 Pain in right knee: Secondary | ICD-10-CM | POA: Diagnosis present

## 2019-08-27 DIAGNOSIS — N400 Enlarged prostate without lower urinary tract symptoms: Secondary | ICD-10-CM | POA: Diagnosis present

## 2019-08-27 DIAGNOSIS — E669 Obesity, unspecified: Secondary | ICD-10-CM | POA: Diagnosis present

## 2019-08-27 DIAGNOSIS — Z6830 Body mass index (BMI) 30.0-30.9, adult: Secondary | ICD-10-CM | POA: Diagnosis not present

## 2019-08-27 DIAGNOSIS — N401 Enlarged prostate with lower urinary tract symptoms: Secondary | ICD-10-CM | POA: Diagnosis not present

## 2019-08-27 DIAGNOSIS — I10 Essential (primary) hypertension: Secondary | ICD-10-CM | POA: Diagnosis present

## 2019-08-27 DIAGNOSIS — E785 Hyperlipidemia, unspecified: Secondary | ICD-10-CM

## 2019-08-27 DIAGNOSIS — Z823 Family history of stroke: Secondary | ICD-10-CM | POA: Diagnosis not present

## 2019-08-27 DIAGNOSIS — G8929 Other chronic pain: Secondary | ICD-10-CM | POA: Diagnosis present

## 2019-08-27 DIAGNOSIS — R4781 Slurred speech: Secondary | ICD-10-CM | POA: Diagnosis present

## 2019-08-27 DIAGNOSIS — I340 Nonrheumatic mitral (valve) insufficiency: Secondary | ICD-10-CM | POA: Diagnosis not present

## 2019-08-27 DIAGNOSIS — I2510 Atherosclerotic heart disease of native coronary artery without angina pectoris: Secondary | ICD-10-CM | POA: Diagnosis present

## 2019-08-27 DIAGNOSIS — I69359 Hemiplegia and hemiparesis following cerebral infarction affecting unspecified side: Secondary | ICD-10-CM | POA: Diagnosis not present

## 2019-08-27 DIAGNOSIS — Z20828 Contact with and (suspected) exposure to other viral communicable diseases: Secondary | ICD-10-CM | POA: Diagnosis present

## 2019-08-27 LAB — ECHOCARDIOGRAM COMPLETE
Height: 70 in
Weight: 3360 oz

## 2019-08-27 LAB — HEMOGLOBIN A1C
Hgb A1c MFr Bld: 5.8 % — ABNORMAL HIGH (ref 4.8–5.6)
Mean Plasma Glucose: 119.76 mg/dL

## 2019-08-27 LAB — LIPID PANEL
Cholesterol: 124 mg/dL (ref 0–200)
HDL: 30 mg/dL — ABNORMAL LOW (ref >40)
LDL Cholesterol: 71 mg/dL (ref 0–99)
Total CHOL/HDL Ratio: 4.1 RATIO
Triglycerides: 113 mg/dL (ref <150)
VLDL: 23 mg/dL (ref 0–40)

## 2019-08-27 MED ORDER — ASPIRIN EC 81 MG PO TBEC
81.0000 mg | DELAYED_RELEASE_TABLET | Freq: Every day | ORAL | Status: DC
  Administered 2019-08-28: 10:00:00 81 mg via ORAL
  Filled 2019-08-27: qty 1

## 2019-08-27 MED ORDER — ATORVASTATIN CALCIUM 40 MG PO TABS
40.0000 mg | ORAL_TABLET | Freq: Every day | ORAL | Status: DC
  Administered 2019-08-27: 19:00:00 40 mg via ORAL
  Filled 2019-08-27: qty 1

## 2019-08-27 NOTE — Progress Notes (Signed)
  Echocardiogram 2D Echocardiogram has been performed.  Michael Kidd L Androw 08/27/2019, 3:01 PM

## 2019-08-27 NOTE — Evaluation (Signed)
Occupational Therapy Evaluation Patient Details Name: Michael Kidd MRN: [id number] DOB: [birth year] Today's Date: 08/27/2019    History of Present Illness 85 y.o. male with medical history significant of hypertension, dysthymia, coronary artery disease status post stents, hyperlipidemia, BPH, brought by EMS to emergency department for the concern of slurred speech and right facial droop.   Clinical Impression   Pt admitted with above. He demonstrates the below listed deficits and will benefit from continued OT to maximize safety and independence with BADLs.  Pt presents to OT with impaired cognition - no family present to attest to how far off of baseline pt is functionally.  The Short Blessed Test was administered with pt scoring 19/24.  He demonstrated deficits with memory, sequencing, attention, and problem solving.  He insists he would not have been able to perform tasks of asked of him at baseline (reciting months in reverse order "I'm a slow learner", "my brain doesn't work like that"; and he says his memory has been getting worse as he gets older).  He, however, informed OT that he and wife took an "Alzheimer's test" at his doctor's office and he missed 1 question.  He is able to perform ADLs and functional mobility modified independently, but recommend he have direct supervision for financial and medication management.  Recommend HHOT for IADLs.  Will follow acutely.       Follow Up Recommendations  Home health OT;Supervision - Intermittent(direct supervision with financial and medication management )    Equipment Recommendations  None recommended by OT    Recommendations for Other Services       Precautions / Restrictions Precautions Precautions: None      Mobility Bed Mobility Overal bed mobility: Modified Independent                Transfers Overall transfer level: Modified independent                    Balance Overall balance assessment: Mild deficits  observed, not formally tested                                         ADL either performed or assessed with clinical judgement   ADL Overall ADL's : Modified independent                                             Vision Baseline Vision/History: No visual deficits Patient Visual Report: No change from baseline Vision Assessment?: Yes Eye Alignment: Within Functional Limits Ocular Range of Motion: Within Functional Limits Alignment/Gaze Preference: Within Defined Limits Tracking/Visual Pursuits: Able to track stimulus in all quads without difficulty Visual Fields: No apparent deficits     Development worker, international aid Tested?: Yes   Praxis Praxis Praxis tested?: Within functional limits    Pertinent Vitals/Pain Pain Assessment: No/denies pain     Hand Dominance     Extremity/Trunk Assessment Upper Extremity Assessment Upper Extremity Assessment: RUE deficits/detail RUE Deficits / Details: pt with limited AROM Rt shoulder which he reports his baseline due to arthritis    Lower Extremity Assessment Lower Extremity Assessment: Defer to PT evaluation   Cervical / Trunk Assessment Cervical / Trunk Assessment: Kyphotic   Communication Communication Communication: HOH   Cognition Arousal/Alertness: Awake/alert Behavior During Therapy: WFL for tasks  assessed/performed Overall Cognitive Status: No family/caregiver present to determine baseline cognitive functioning                                 General Comments: Short Blessed Test was administered  and pt scored 19/28 which is indicative of cognitive impairment.  He demonstrated difficulties with recall, attention, sequencing, and problem solving.  he indicates he is a slow learner and insists he would not have been able to state months of the year in reverse order, and that his memory is not so good as he has aged.  He however, says he "took an Alzheimer's test at my  doctor's and I missed 1".  He insists his wife can assist/supervise him with medication management and financial management    General Comments       Exercises     Shoulder Instructions      Home Living Family/patient expects to be discharged to:: Private residence Living Arrangements: Spouse/significant other Available Help at Discharge: Family;Available 24 hours/day Type of Home: House Home Access: Stairs to enter [employer] of Steps: 3 Entrance Stairs-Rails: Right;Left Home Layout: One level     Bathroom Shower/Tub: Teacher, early years/pre: Standard     Home Equipment: None   Additional Comments: Pt takes sponge baths       Prior Functioning/Environment Level of Independence: Independent        Comments: Pt reports he drives. 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"         OT Problem List: Decreased cognition;Decreased safety awareness      OT Treatment/Interventions: Self-care/ADL training;Therapeutic activities;Cognitive remediation/compensation;Patient/family education    OT Goals(Current goals can be found in the care plan section) Acute Rehab OT Goals Patient Stated Goal: To go home  OT Goal Formulation: With patient Time For Goal Achievement: 09/10/19 Potential to Achieve Goals: Good ADL Goals Additional ADL Goal #1: Pt will perform path finding task with no more than min questioning cues  OT Frequency: Min 2X/week   Barriers to D/C:            Co-evaluation              AM-PAC OT "6 Clicks" Daily Activity     Outcome Measure Help from another person eating meals?: None Help from another person taking care of personal grooming?: None Help from another person toileting, which includes using toliet, bedpan, or urinal?: None Help from another person bathing (including washing, rinsing, drying)?: None Help from another person to put on and taking off regular upper body clothing?: None Help from another person  to put on and taking off regular lower body clothing?: None 6 Click Score: 24   End of Session Nurse Communication: Mobility status  Activity Tolerance: Patient tolerated treatment well Patient left: in bed;with call bell/phone within reach  OT Visit Diagnosis: Cognitive communication deficit (R41.841) Symptoms and signs involving cognitive functions: Cerebral infarction                Time: 6433-2951 OT Time Calculation (min): 51 min Charges:  OT General Charges $OT Visit: 1 Visit OT Evaluation $OT Eval Moderate Complexity: 1 Mod OT Treatments $Self Care/Home Management : 8-22 mins $Therapeutic Activity: 8-22 mins  Lucille Passy, OTR/L Acute Rehabilitation Services Pager [phone] Office [phone]   Lucille Passy M 08/27/2019, 4:55 PM

## 2019-08-27 NOTE — Progress Notes (Signed)
SLP Cancellation Note  Patient Details Name: Alexzavier Girardin MRN: [id number] DOB: 09/13/1930   Cancelled treatment:       Reason Eval/Treat Not Completed: Patient at procedure or test/unavailable   Kimiyo Carmicheal, Katherene Ponto 08/27/2019, 2:57 PM

## 2019-08-27 NOTE — Progress Notes (Signed)
STROKE TEAM PROGRESS NOTE   INTERVAL HISTORY His RN is at the bedside.  Patient felt his symptoms all resolved.  Patient denies any heart palpitation or racing heart.  OBJECTIVE Vitals:   08/27/19 0431 08/27/19 0820 08/27/19 1218 08/27/19 1312  BP: (!) 148/86 (!) 148/81 (!) 171/83   Pulse: (!) 55 (!) 57 63   Resp: 16 16 16    Temp: 97.7 F (36.5 C) 97.9 F (36.6 C) 97.9 F (36.6 C)   TempSrc: Oral Oral Oral   SpO2: 93% 95% 97%   Weight:    95.3 kg  Height:    5\' 10"  (1.778 m)    CBC:  Recent Labs  Lab 08/26/19 1136 08/26/19 1141  WBC 6.0  --   NEUTROABS 3.4  --   HGB 14.2 13.9  HCT 40.9 41.0  MCV 92.5  --   PLT 193  --     Basic Metabolic Panel:  Recent Labs  Lab 08/26/19 1136 08/26/19 1141  NA 141 142  K 4.1 4.0  CL 107 107  CO2 25  --   GLUCOSE 116* 112*  BUN 14 16  CREATININE 1.01 0.90  CALCIUM 8.7*  --     Lipid Panel:     Component Value Date/Time   CHOL 124 08/27/2019 0532   TRIG 113 08/27/2019 0532   HDL 30 (L) 08/27/2019 0532   CHOLHDL 4.1 08/27/2019 0532   VLDL 23 08/27/2019 0532   LDLCALC 71 08/27/2019 0532   HgbA1c:  Lab Results  Component Value Date   HGBA1C 5.8 (H) 08/27/2019   Urine Drug Screen:     Component Value Date/Time   LABOPIA NONE DETECTED 08/26/2019 1135   COCAINSCRNUR NONE DETECTED 08/26/2019 1135   LABBENZ NONE DETECTED 08/26/2019 1135   AMPHETMU NONE DETECTED 08/26/2019 1135   THCU NONE DETECTED 08/26/2019 1135   LABBARB NONE DETECTED 08/26/2019 1135    Alcohol Level     Component Value Date/Time   ETH <10 08/26/2019 1136    IMAGING  Ct Angio Head W Or Wo Contrast Ct Angio Neck W And/or Wo Contrast 08/26/2019 IMPRESSION:  1. No evidence of acute intracranial abnormality.  2. 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  3. Mild atherosclerosis in the head and neck without large vessel occlusion or significant proximal stenosis.   Mr Brain Wo Contrast 08/26/2019    ADDENDUM REPORT:  Study discussed by telephone with provider JAIME WARD on 08/26/2019 at 1552 hours. We discussed that the acute ischemia would result in left side symptoms. But that there is chronic ischemia in the left hemisphere.  IMPRESSION:  1. Positive for several small acute cortical infarcts in the posterior right MCA territory at the motor strip. Questionable petechial hemorrhage but no malignant hemorrhagic transformation or mass effect.  2. Superimposed punctate acute infarct in the right occipital pole. This might reflect synchronous small vessel disease rather than a recent embolic event.  3. Underlying chronic small vessel disease in the left cerebellum, left caudate, and left pons.    [place] Lower Extremity Venous (dvt) 08/27/2019 Summary:  Right: There is no evidence of deep vein thrombosis in the lower extremity. No cystic structure found in the popliteal fossa.  Left: There is no evidence of deep vein thrombosis in the lower extremity. No cystic structure found in the popliteal fossa.  Preliminary     Transthoracic Echocardiogram 08/27/2019  1. Left ventricular ejection fraction, by visual estimation, is 55 to 60%. The left  ventricle has normal function. Normal left ventricular size. There is mildly increased left ventricular hypertrophy.  2. Left ventricular diastolic Doppler parameters are consistent with impaired relaxation pattern of LV diastolic filling.  3. Global right ventricle has normal systolic function.The right ventricular size is normal. No increase in right ventricular wall thickness.  4. Left atrial size was normal.  5. Right atrial size was normal.  6. The mitral valve is normal in structure. Mild mitral valve regurgitation. No evidence of mitral stenosis.  7. The tricuspid valve is normal in structure. Tricuspid valve regurgitation was not visualized by color flow Doppler.  8. The aortic valve is trileaflet with moderately thickened and calcified leaflets.  Aortic valve regurgitation was not visualized by color flow Doppler. Moderate aortic valve stenosis.  9. The pulmonic valve was normal in structure. Pulmonic valve regurgitation is not visualized by color flow Doppler. 10. Mildly elevated pulmonary artery systolic pressure. 11. The inferior vena cava is normal in size with greater than 50% respiratory variability, suggesting right atrial pressure of 3 mmHg. 12. Aortic valve mean gradient measures 24.0 mmHg. 13. Aortic valve peak gradient measures 35.0 mmHg.  ECG - SB rate 55 BPM. (See cardiology reading for complete details)   PHYSICAL EXAM  Temp:  [97.6 F (36.4 C)-98 F (36.7 C)] 97.9 F (36.6 C) (09/20 1218) Pulse Rate:  [55-80] 63 (09/20 1218) Resp:  [16] 16 (09/20 1218) BP: (147-195)/(75-109) 171/83 (09/20 1218) SpO2:  [93 %-99 %] 97 % (09/20 1218) Weight:  [95.3 kg] 95.3 kg (09/20 1312)  General - Well nourished, well developed, in no apparent distress.  Ophthalmologic - fundi not visualized due to noncooperation.  Cardiovascular - Regular rate and rhythm.  Mental Status -  Level of arousal and orientation to time, place, and person were intact. Language including expression, naming, repetition, comprehension was assessed and found intact.  Cranial Nerves II - XII - II - Visual field intact OU. III, IV, VI - Extraocular movements intact. V - Facial sensation intact bilaterally. VII - Facial movement intact bilaterally. VIII - Hearing & vestibular intact bilaterally. X - Palate elevates symmetrically. XI - Chin turning & shoulder shrug intact bilaterally. XII - Tongue protrusion intact.  Motor Strength - The patient's strength was normal in all extremities and pronator drift was absent.  Bulk was normal and fasciculations were absent.   Motor Tone - Muscle tone was assessed at the neck and appendages and was normal.  Reflexes - The patient's reflexes were symmetrical in all extremities and he had no pathological  reflexes.  Sensory - Light touch, temperature/pinprick were assessed and were symmetrical.    Coordination - The patient had normal movements in the hands with no ataxia or dysmetria.  Tremor was absent.  Gait and Station - deferred.   ASSESSMENT/PLAN [name] is a 85 y.o. male with history of HTN, CAD s/p stents, dysthymia, BPH  presenting with facial droop and slurred speech. He did not receive IV t-PA due to late presentation (>4.5 hours from time of onset).  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 fib.   MRI head - several small acute cortical infarcts in the posterior right MCA territory at the motor strip. Superimposed punctate acute infarct in the right occipital pole.    CTA H&N - No evidence of acute intracranial abnormality. 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  Bilateral Lower Extremity Venous Dopplers -  negative for DVT  2D Echo EF 55 to 60%  Recommend loop recorder to be placed tomorrow to rule out A. fib  [employer] Virus 2 - negative  LDL - 71  HgbA1c - 5.8  UDS - negative  VTE prophylaxis - Lovenox  aspirin 81 mg daily prior to admission, now on aspirin 81 mg daily and clopidogrel 75 mg daily.  Continue DAPT for 3 weeks and then Plavix alone.  Patient counseled to be compliant with his antithrombotic medications  Ongoing aggressive stroke risk factor management  Therapy recommendations:  No PT F/U recommended.  Disposition:  Pending  Hypertension  Home BP meds: Zestril ; metoprolol  Current BP meds: metoprolol  Stable . Permissive hypertension (OK if < 220/120) but gradually normalize in 3-5 days  . Long-term BP goal normotensive  Hyperlipidemia  Home Lipid lowering medication:  Zocor 40 mg daily  LDL 71, goal < 70  Current lipid lowering medication: Lipitor 40 mg daily  Continue statin at  discharge  Other Stroke Risk Factors  Advanced age  Previous ETOH use  Obesity, Body mass index is 30.13 kg/m., recommend weight loss, diet and exercise as appropriate   Hx stroke/TIA - by imaging  Family hx stroke (father)  Coronary artery disease status post stent  Other Active Problems  Low HDL  Hospital day # 0  Rosalin Hawking, MD PhD Stroke Neurology 08/27/2019 4:34 PM  To contact Stroke Continuity provider, please refer to [email or web address] After hours, contact General Neurology

## 2019-08-27 NOTE — Progress Notes (Signed)
PROGRESS NOTE  Michael Kidd RPR:[id number] DOB: 1930/03/26 DOA: 08/26/2019 PCP: System, Pcp Not In  HPI/Recap of past 24 hours: HPI from Dr Michael Kidd is a 85 y.o. male with medical history significant of hypertension, dysthymia, coronary artery disease status post stents, hyperlipidemia, BPH, brought by EMS to emergency department for the concern of slurred speech and right facial droop which started suddenly in the am on day of arrival. Pt lives with his wife at home. He is independent on daily life activities. In the ED, CT angiogram of head and neck was obtained in the ED which came back negative for acute findings. Neurology was consulted by EDP for further evaluation and management.  Patient admitted for further management.   Today, patient denies any further slurred speech or facial droop, denies any other focal neurologic deficits.  Denies any chest pain, shortness of breath, nausea/vomiting, fever/chills.  Assessment/Plan: Active Problems:   Stroke [facility])   Essential hypertension   BPH (benign prostatic hyperplasia)   Hyperlipidemia  Small right infarcts and MCA/PCA punctate infarcts MRI brain shows several small acute cortical infarcts in the posterior right MCA, superimposed punctate acute infarct in the right occipital pole CTA head/neck showed no evidence of acute intracranial abnormality Bilateral lower extremity Doppler negative for DVT Echo showed EF 55 to 60% LDL 71 A1c 5.8 Neurology consulted recommended loop recorder to be placed on 08/27/2021 rule out A. Fib.  Continue DAPT for 3 weeks and then Plavix alone PT no further recommendation, OT recommend home health OT  CAD Stable, chest pain-free Continue aspirin, statin  Hypertension Allow permissive hypertension Continue metoprolol, hold home Zestril  Hyperlipidemia LDL 71, switch to Lipitor  BPH Stable Continue Flomax, Proscar        Malnutrition Type:      Malnutrition  Characteristics:      Nutrition Interventions:       Estimated body mass index is 30.13 kg/m as calculated from the following:   Height as of this encounter: 5\' 10"  (1.778 m).   Weight as of this encounter: 95.3 kg.     Code Status: Full  Family Communication: None at bedside  Disposition Plan: Likely home   Consultants:  Neurology  Procedures:  None  Antimicrobials:  None  DVT prophylaxis: Lovenox   Objective: Vitals:   08/27/19 0820 08/27/19 1218 08/27/19 1312 08/27/19 1640  BP: (!) 148/81 (!) 171/83  (!) 181/84  Pulse: (!) 57 63  63  Resp: 16 16  16   Temp: 97.9 F (36.6 C) 97.9 F (36.6 C)  97.9 F (36.6 C)  TempSrc: Oral Oral  Oral  SpO2: 95% 97%  96%  Weight:   95.3 kg   Height:   5\' 10"  (1.778 m)    No intake or output data in the 24 hours ending 08/27/19 1803 Filed Weights   08/27/19 1312  Weight: 95.3 kg    Exam:  General: NAD   Cardiovascular: S1, S2 present  Respiratory: CTAB  Abdomen: Soft, nontender, nondistended, bowel sounds present  Musculoskeletal: No bilateral pedal edema noted  Skin: Normal  Psychiatry: Normal mood  Neurology: No focal neurologic deficit noted   Data Reviewed: CBC: Recent Labs  Lab 08/26/19 1136 08/26/19 1141  WBC 6.0  --   NEUTROABS 3.4  --   HGB 14.2 13.9  HCT 40.9 41.0  MCV 92.5  --   PLT 193  --    Basic Metabolic Panel: Recent Labs  Lab 08/26/19 1136 08/26/19 1141  NA  141 142  K 4.1 4.0  CL 107 107  CO2 25  --   GLUCOSE 116* 112*  BUN 14 16  CREATININE 1.01 0.90  CALCIUM 8.7*  --    GFR: Estimated Creatinine Clearance: 68.3 mL/min (by C-G formula based on SCr of 0.9 mg/dL). Liver Function Tests: Recent Labs  Lab 08/26/19 1136  AST 24  ALT 17  ALKPHOS 69  BILITOT 1.1  PROT 6.1*  ALBUMIN 3.7   No results for input(s): LIPASE, AMYLASE in the last 168 hours. No results for input(s): AMMONIA in the last 168 hours. Coagulation Profile: Recent Labs  Lab  08/26/19 1136  INR 1.2   Cardiac Enzymes: No results for input(s): CKTOTAL, CKMB, CKMBINDEX, TROPONINI in the last 168 hours. BNP (last 3 results) No results for input(s): PROBNP in the last 8760 hours. HbA1C: Recent Labs    08/27/19 0532  HGBA1C 5.8*   CBG: Recent Labs  Lab 08/26/19 1126  GLUCAP 104*   Lipid Profile: Recent Labs    08/27/19 0532  CHOL 124  HDL 30*  LDLCALC 71  TRIG [id number]  CHOLHDL 4.1   Thyroid Function Tests: No results for input(s): TSH, T4TOTAL, FREET4, T3FREE, THYROIDAB in the last 72 hours. Anemia Panel: No results for input(s): VITAMINB12, FOLATE, FERRITIN, TIBC, IRON, RETICCTPCT in the last 72 hours. Urine analysis:    Component Value Date/Time   COLORURINE STRAW (A) 08/26/2019 1135   APPEARANCEUR CLEAR 08/26/2019 1135   LABSPEC 1.006 08/26/2019 1135   PHURINE 7.0 08/26/2019 1135   GLUCOSEU NEGATIVE 08/26/2019 1135   HGBUR NEGATIVE 08/26/2019 1135   BILIRUBINUR NEGATIVE 08/26/2019 1135   KETONESUR NEGATIVE 08/26/2019 1135   PROTEINUR NEGATIVE 08/26/2019 1135   NITRITE NEGATIVE 08/26/2019 1135   LEUKOCYTESUR NEGATIVE 08/26/2019 1135   Sepsis Labs: @LABRCNTIP (procalcitonin:4,lacticidven:4)  ) Recent Results (from the past 240 hour(s))  SARS CORONAVIRUS 2 (TAT 6-24 HRS) Nasopharyngeal Nasopharyngeal Swab     Status: None   Collection Time: 08/26/19  2:45 PM   Specimen: Nasopharyngeal Swab  Result Value Ref Range Status   SARS Coronavirus 2 NEGATIVE NEGATIVE Final    Comment: (NOTE) SARS-CoV-2 target nucleic acids are NOT DETECTED. The SARS-CoV-2 RNA is generally detectable in upper and lower respiratory specimens during the acute phase of infection. Negative results do not preclude SARS-CoV-2 infection, do not rule out co-infections with other pathogens, and should not be used as the sole basis for treatment or other patient management decisions. Negative results must be combined with clinical observations, patient history, and  epidemiological information. The expected result is Negative. Fact Sheet for Patients: HairSlick.nohttps://www.fda.gov/media/138098/download Fact Sheet for Healthcare Providers: quierodirigir.comhttps://www.fda.gov/media/138095/download This test is not yet approved or cleared by the [place] FDA and  has been authorized for detection and/or diagnosis of SARS-CoV-2 by FDA under an Emergency Use Authorization (EUA). This EUA will remain  in effect (meaning this test can be used) for the duration of the COVID-19 declaration under Section 56 4(b)(1) of the Act, 21 U.S.C. section 360bbb-3(b)(1), unless the authorization is terminated or revoked sooner. Performed at [facility], [address] [place], [place] [address]       Studies: Vas Koreas Lower Extremity Venous (dvt)  Result Date: 08/27/2019  Lower Venous Study Indications: Stroke.  Comparison Study: no prior Performing Technologist: Jeb LeveringJill Parker RDMS, RVT  Examination Guidelines: A complete evaluation includes B-mode imaging, spectral Doppler, color Doppler, and power Doppler as needed of all accessible portions of each vessel. Bilateral testing is considered an  integral part of a complete examination. Limited examinations for reoccurring indications may be performed as noted.  +---------+---------------+---------+-----------+----------+--------------+  RIGHT     Compressibility Phasicity Spontaneity Properties Thrombus Aging  +---------+---------------+---------+-----------+----------+--------------+  CFV       Full            Yes       Yes                                    +---------+---------------+---------+-----------+----------+--------------+  SFJ       Full                                                             +---------+---------------+---------+-----------+----------+--------------+  FV Prox   Full                                                             +---------+---------------+---------+-----------+----------+--------------+  FV Mid     Full                                                             +---------+---------------+---------+-----------+----------+--------------+  FV Distal Full                                                             +---------+---------------+---------+-----------+----------+--------------+  PFV       Full                                                             +---------+---------------+---------+-----------+----------+--------------+  POP       Full            Yes       Yes                                    +---------+---------------+---------+-----------+----------+--------------+  PTV       Full                                                             +---------+---------------+---------+-----------+----------+--------------+  PERO      Full                                                             +---------+---------------+---------+-----------+----------+--------------+   +---------+---------------+---------+-----------+----------+--------------+  LEFT      Compressibility Phasicity Spontaneity Properties Thrombus Aging  +---------+---------------+---------+-----------+----------+--------------+  CFV       Full            Yes       Yes                                    +---------+---------------+---------+-----------+----------+--------------+  SFJ       Full                                                             +---------+---------------+---------+-----------+----------+--------------+  FV Prox   Full                                                             +---------+---------------+---------+-----------+----------+--------------+  FV Mid    Full                                                             +---------+---------------+---------+-----------+----------+--------------+  FV Distal Full                                                             +---------+---------------+---------+-----------+----------+--------------+  PFV       Full                                                              +---------+---------------+---------+-----------+----------+--------------+  POP       Full            Yes       Yes                                    +---------+---------------+---------+-----------+----------+--------------+  PTV       Full                                                             +---------+---------------+---------+-----------+----------+--------------+  PERO      Full                                                             +---------+---------------+---------+-----------+----------+--------------+  Summary: Right: There is no evidence of deep vein thrombosis in the lower extremity. No cystic structure found in the popliteal fossa. Left: There is no evidence of deep vein thrombosis in the lower extremity. No cystic structure found in the popliteal fossa.  *See table(s) above for measurements and observations.    Preliminary     Scheduled Meds:  [START ON 08/28/2019] aspirin EC  81 mg Oral Daily   atorvastatin  40 mg Oral q1800   clopidogrel  75 mg Oral Daily   enoxaparin (LOVENOX) injection  40 mg Subcutaneous Q24H   finasteride  5 mg Oral Daily   metoprolol tartrate  12.5 mg Oral BID   pantoprazole  40 mg Oral Daily   tamsulosin  0.4 mg Oral Daily    Continuous Infusions:   LOS: 0 days     Alma Friendly, MD [facility]  If 7PM-7AM, please contact night-coverage [email or web address] 08/27/2019, 6:03 PM

## 2019-08-27 NOTE — Evaluation (Signed)
Physical Therapy Evaluation Patient Details Name: Michael Kidd MRN: [id number] DOB: [birth year] Today's Date: 08/27/2019   History of Present Illness  85 y.o. male with medical history significant of hypertension, dysthymia, coronary artery disease status post stents, hyperlipidemia, BPH, brought by EMS to emergency department for the concern of slurred speech and right facial droop.    Clinical Impression  PT eval complete. Pt supervision for all mobility, including ambulation 200 feet without AD and ascend/descend 5 steps with rails. Pt reports he is at his baseline. No further PT intervention indicated. PT signing off.    Follow Up Recommendations No PT follow up    Equipment Recommendations  None recommended by PT    Recommendations for Other Services       Precautions / Restrictions Precautions Precautions: None      Mobility  Bed Mobility Overal bed mobility: Modified Independent             General bed mobility comments: +rail, increased time and effort  Transfers Overall transfer level: Needs assistance Equipment used: None Transfers: Sit to/from Stand;Stand Pivot Transfers Sit to Stand: Supervision Stand pivot transfers: Supervision       General transfer comment: supervision for safety, no physical assist  Ambulation/Gait Ambulation/Gait assistance: Supervision Gait Distance (Feet): 200 Feet Assistive device: None Gait Pattern/deviations: WFL(Within Functional Limits) Gait velocity: WFL Gait velocity interpretation: >2.62 ft/sec, indicative of community ambulatory General Gait Details: steady gait, mildly guarded  Stairs Stairs: Yes Stairs assistance: Supervision Stair Management: Two rails;Forwards;Step to pattern Number of Stairs: 5 General stair comments: Pt demo good technique. No cueing or assist.  Wheelchair Mobility    Modified Rankin (Stroke Patients Only) Modified Rankin (Stroke Patients Only) Pre-Morbid Rankin Score: No  symptoms Modified Rankin: No significant disability     Balance Overall balance assessment: Mild deficits observed, not formally tested                                           Pertinent Vitals/Pain Pain Assessment: No/denies pain    Home Living Family/patient expects to be discharged to:: Private residence Living Arrangements: Spouse/significant other Available Help at Discharge: Family;Available 24 hours/day Type of Home: House Home Access: Stairs to enter Entrance Stairs-Rails: Psychiatric nurse of Steps: 3 Home Layout: One level Home Equipment: None      Prior Function Level of Independence: Independent         Comments: Pt takes sponge bath. Does not get in tub/shower.     Hand Dominance        Extremity/Trunk Assessment   Upper Extremity Assessment Upper Extremity Assessment: Defer to OT evaluation    Lower Extremity Assessment Lower Extremity Assessment: Overall WFL for tasks assessed    Cervical / Trunk Assessment Cervical / Trunk Assessment: Kyphotic  Communication   Communication: No difficulties  Cognition Arousal/Alertness: Awake/alert Behavior During Therapy: WFL for tasks assessed/performed Overall Cognitive Status: Within Functional Limits for tasks assessed                                        General Comments General comments (skin integrity, edema, etc.): no overt LOB during mobility including turns and stairs    Exercises     Assessment/Plan    PT Assessment Patent does not need any further PT services  PT Problem List         PT Treatment Interventions      PT Goals (Current goals can be found in the Care Plan section)  Acute Rehab PT Goals Patient Stated Goal: home PT Goal Formulation: All assessment and education complete, DC therapy    Frequency     Barriers to discharge        Co-evaluation               AM-PAC PT "6 Clicks" Mobility  Outcome  Measure Help needed turning from your back to your side while in a flat bed without using bedrails?: None Help needed moving from lying on your back to sitting on the side of a flat bed without using bedrails?: A Little Help needed moving to and from a bed to a chair (including a wheelchair)?: None Help needed standing up from a chair using your arms (e.g., wheelchair or bedside chair)?: None Help needed to walk in hospital room?: None Help needed climbing 3-5 steps with a railing? : None 6 Click Score: 23    End of Session Equipment Utilized During Treatment: Gait belt Activity Tolerance: Patient tolerated treatment well Patient left: in chair;with call bell/phone within reach Nurse Communication: Mobility status PT Visit Diagnosis: Unsteadiness on feet (R26.81)    Time: 4401-0272 PT Time Calculation (min) (ACUTE ONLY): 22 min   Charges:   PT Evaluation $PT Eval Moderate Complexity: 1 Mod          Aida Raider, PT  Office # [phone] Pager [phone]   Ilda Foil 08/27/2019, 12:49 PM

## 2019-08-28 ENCOUNTER — Encounter (HOSPITAL_COMMUNITY)
Admission: EM | Disposition: A | Discharge status: Home Health | Payer: Self-pay | Source: Home / Self Care | Attending: Internal Medicine

## 2019-08-28 DIAGNOSIS — I6389 Other cerebral infarction: Secondary | ICD-10-CM

## 2019-08-28 DIAGNOSIS — I10 Essential (primary) hypertension: Secondary | ICD-10-CM

## 2019-08-28 HISTORY — PX: LOOP RECORDER INSERTION: EP1214

## 2019-08-28 LAB — CBC WITH DIFFERENTIAL/PLATELET
Abs Immature Granulocytes: 0.02 10*3/uL (ref 0.00–0.07)
Basophils Absolute: 0 10*3/uL (ref 0.0–0.1)
Basophils Relative: 0 %
Eosinophils Absolute: 0.1 10*3/uL (ref 0.0–0.5)
Eosinophils Relative: 2 %
HCT: 40.3 % (ref 39.0–52.0)
Hemoglobin: 14.2 g/dL (ref 13.0–17.0)
Immature Granulocytes: 0 %
Lymphocytes Relative: 34 %
Lymphs Abs: 2.3 10*3/uL (ref 0.7–4.0)
MCH: 32.1 pg (ref 26.0–34.0)
MCHC: 35.2 g/dL (ref 30.0–36.0)
MCV: 91 fL (ref 80.0–100.0)
Monocytes Absolute: 0.6 10*3/uL (ref 0.1–1.0)
Monocytes Relative: 9 %
Neutro Abs: 3.7 10*3/uL (ref 1.7–7.7)
Neutrophils Relative %: 55 %
Platelets: 202 10*3/uL (ref 150–400)
RBC: 4.43 MIL/uL (ref 4.22–5.81)
RDW: 13.3 % (ref 11.5–15.5)
WBC: 6.7 10*3/uL (ref 4.0–10.5)
nRBC: 0 % (ref 0.0–0.2)

## 2019-08-28 LAB — BASIC METABOLIC PANEL
Anion gap: 10 (ref 5–15)
BUN: 18 mg/dL (ref 8–23)
CO2: 25 mmol/L (ref 22–32)
Calcium: 8.7 mg/dL — ABNORMAL LOW (ref 8.9–10.3)
Chloride: 106 mmol/L (ref 98–111)
Creatinine, Ser: 1.09 mg/dL (ref 0.61–1.24)
GFR calc Af Amer: >60 mL/min (ref >60)
GFR calc non Af Amer: >60 mL/min (ref >60)
Glucose, Bld: 111 mg/dL — ABNORMAL HIGH (ref 70–99)
Potassium: 3.8 mmol/L (ref 3.5–5.1)
Sodium: 141 mmol/L (ref 135–145)

## 2019-08-28 SURGERY — LOOP RECORDER INSERTION

## 2019-08-28 MED ORDER — ATORVASTATIN CALCIUM 40 MG PO TABS: 40.0000 mg | ORAL_TABLET | Freq: Every day | ORAL | 0 refills | Status: DC

## 2019-08-28 MED ORDER — ONDANSETRON HCL 4 MG/2ML IJ SOLN: 4.0000 mg | Freq: Four times a day (QID) | INTRAMUSCULAR | Status: DC | PRN

## 2019-08-28 MED ORDER — ACETAMINOPHEN 325 MG PO TABS: 325.0000 mg | ORAL_TABLET | ORAL | Status: DC | PRN

## 2019-08-28 MED ORDER — LIDOCAINE-EPINEPHRINE 1 %-1:100000 IJ SOLN
INTRAMUSCULAR | Status: DC | PRN
  Administered 2019-08-28: 30 mL

## 2019-08-28 MED ORDER — ASPIRIN EC 81 MG PO TBEC: 81.0000 mg | DELAYED_RELEASE_TABLET | Freq: Every day | ORAL | 0 refills | Status: AC

## 2019-08-28 MED ORDER — CLOPIDOGREL BISULFATE 75 MG PO TABS: 75.0000 mg | ORAL_TABLET | Freq: Every day | ORAL | 1 refills | Status: DC

## 2019-08-28 MED ORDER — LIDOCAINE-EPINEPHRINE 1 %-1:100000 IJ SOLN
INTRAMUSCULAR | Status: AC
  Filled 2019-08-28: qty 1

## 2019-08-28 SURGICAL SUPPLY — 2 items
MONITOR REVEAL LINQ II (Prosthesis & Implant Heart) ×2 IMPLANT
PACK LOOP INSERTION (CUSTOM PROCEDURE TRAY) ×3 IMPLANT

## 2019-08-28 NOTE — H&P (View-Only) (Signed)
ELECTROPHYSIOLOGY CONSULT NOTE  Patient ID: Michael Kidd MRN: [id number], DOB/AGE: 85   Admit date: 08/26/2019 Date of Consult: 08/28/2019  Primary Physician: System, Pcp Not In Primary Cardiologist: none Reason for Consultation: Cryptogenic stroke - recommendations regarding Implantable Loop Recorder, requested by Dr. Roda Shutters  History of Present Illness Michael Kidd was admitted on 08/26/2019 with stroke.  They first developed symptoms while at home.   PMHx includes CAD (prior stents "many years ago, does not follow with any cardiologist), HLD, BPH, HTN, dythymia  Neurology noted: Small right motor strip infarcts and MCA/PCA punctate infarct - embolic - source unknown, concerning for occult A. fib   he has undergone workup for stroke including echocardiogram and carotid angio, negative LE venous US for DVTs b/l, neurology has not requested TEE.  The patient has been monitored on telemetry which has demonstrated sinus rhythm with no arrhythmias.     Echocardiogram this admission demonstrated   IMPRESSIONS  1. Left ventricular ejection fraction, by visual estimation, is 55 to 60%. The left ventricle has normal function. Normal left ventricular size. There is mildly increased left ventricular hypertrophy.  2. Left ventricular diastolic Doppler parameters are consistent with impaired relaxation pattern of LV diastolic filling.  3. Global right ventricle has normal systolic function.The right ventricular size is normal. No increase in right ventricular wall thickness.  4. Left atrial size was normal.  5. Right atrial size was normal.  6. The mitral valve is normal in structure. Mild mitral valve regurgitation. No evidence of mitral stenosis.  7. The tricuspid valve is normal in structure. Tricuspid valve regurgitation was not visualized by color flow Doppler.  8. The aortic valve is trileaflet with moderately thickened and calcified leaflets. Aortic valve regurgitation was not visualized  by color flow Doppler. Moderate aortic valve stenosis.  9. The pulmonic valve was normal in structure. Pulmonic valve regurgitation is not visualized by color flow Doppler. 10. Mildly elevated pulmonary artery systolic pressure. 11. The inferior vena cava is normal in size with greater than 50% respiratory variability, suggesting right atrial pressure of 3 mmHg. 12. Aortic valve mean gradient measures 24.0 mmHg. 13. Aortic valve peak gradient measures 35.0 mmHg.   Lab work is reviewed.  Prior to admission, the patient denies chest pain, shortness of breath, dizziness, palpitations, or syncope.  They are recovering from their stroke with plans to home at discharge.   Past Medical History:  Diagnosis Date   Arthritis    Bilateral chronic knee pain    BPH (benign prostatic hyperplasia)    Dysthymia    HTN (hypertension)    Renal cyst      Surgical History:  Past Surgical History:  Procedure Laterality Date   PERCUTANEOUS CORONARY STENT INTERVENTION (PCI-S)       Medications Prior to Admission  Medication Sig Dispense Refill Last Dose   acetaminophen (TYLENOL) 650 MG CR tablet Take 650 mg by mouth every 8 (eight) hours as needed for pain.   08/26/2019 at prn   aspirin EC 81 MG tablet Take 81 mg by mouth daily.   08/26/2019 at Unknown time   finasteride (PROSCAR) 5 MG tablet Take 5 mg by mouth daily.   08/26/2019 at Unknown time   lisinopril (ZESTRIL) 5 MG tablet Take 5 mg by mouth daily.   08/26/2019 at Unknown time   metoprolol tartrate (LOPRESSOR) 25 MG tablet Take 12.5 mg by mouth 2 (two) times daily.   08/26/2019 at 0800   omeprazole (PRILOSEC) 20 MG capsule Take  20 mg by mouth daily.   08/26/2019 at Unknown time   simvastatin (ZOCOR) 40 MG tablet Take 40 mg by mouth daily.   08/26/2019 at Unknown time   tamsulosin (FLOMAX) 0.4 MG CAPS capsule Take 0.4 mg by mouth daily.   08/26/2019 at Unknown time    Inpatient Medications:   aspirin EC  81 mg Oral Daily    atorvastatin  40 mg Oral q1800   clopidogrel  75 mg Oral Daily   enoxaparin (LOVENOX) injection  40 mg Subcutaneous Q24H   finasteride  5 mg Oral Daily   metoprolol tartrate  12.5 mg Oral BID   pantoprazole  40 mg Oral Daily   tamsulosin  0.4 mg Oral Daily    Allergies: No Known Allergies  Social History   Socioeconomic History   Marital status: Married    Spouse name: Not on file   Number of children: Not on file   Years of education: Not on file   Highest education level: Not on file  Occupational History   Not on file  Social Needs   Financial resource strain: Not on file   Food insecurity    Worry: Not on file    Inability: Not on file   Transportation needs    Medical: Not on file    Non-medical: Not on file  Tobacco Use   Smoking status: Never Smoker   Smokeless tobacco: Never Used  Substance and Sexual Activity   Alcohol use: Not Currently   Drug use: Never   Sexual activity: Not on file  Lifestyle   Physical activity    Days per week: Not on file    Minutes per session: Not on file   Stress: Not on file  Relationships   Social connections    Talks on phone: Not on file    Gets together: Not on file    Attends religious service: Not on file    Active member of club or organization: Not on file    Attends meetings of clubs or organizations: Not on file    Relationship status: Not on file   Intimate partner violence    Fear of current or ex partner: Not on file    Emotionally abused: Not on file    Physically abused: Not on file    Forced sexual activity: Not on file  Other Topics Concern   Not on file  Social History Narrative   Not on file     Family History  Problem Relation Age of Onset   Diabetes Mother    Stroke Father       Review of Systems: All other systems reviewed and are otherwise negative except as noted above.  Physical Exam: Vitals:   08/27/19 2014 08/28/19 0019 08/28/19 0423 08/28/19 0916  BP:  (!) 179/103 (!) 163/78 (!) 170/78 (!) 150/78  Pulse: 74 62 60 70  Resp: 17 16 16 20   Temp: 97.7 F (36.5 C) 97.7 F (36.5 C) (!) 97.5 F (36.4 C) (!) 97.4 F (36.3 C)  TempSrc: Oral Oral Oral Oral  SpO2: 96% 90% 96% 97%  Weight:      Height:        GEN- The patient is well appearing, alert and oriented x 3 today.   Head- normocephalic, atraumatic Eyes-  Sclera clear, conjunctiva pink Ears- hearing intact Oropharynx- clear Neck- supple Lungs- CTA b/l, normal work of breathing Heart- RRR, no murmurs, rubs or gallops  GI- soft, NT, ND Extremities- no clubbing,  cyanosis, or edema MS- no significant deformity or atrophy Skin- no rash or lesion Psych- euthymic mood, full affect   Labs:   Lab Results  Component Value Date   WBC 6.7 08/28/2019   HGB 14.2 08/28/2019   HCT 40.3 08/28/2019   MCV 91.0 08/28/2019   PLT 202 08/28/2019    Recent Labs  Lab 08/26/19 1136  08/28/19 0440  NA 141   < > 141  K 4.1   < > 3.8  CL 107   < > 106  CO2 25  --  25  BUN 14   < > 18  CREATININE 1.01   < > 1.09  CALCIUM 8.7*  --  8.7*  PROT 6.1*  --   --   BILITOT 1.1  --   --   ALKPHOS 69  --   --   ALT 17  --   --   AST 24  --   --   GLUCOSE 116*   < > 111*   < > = values in this interval not displayed.   No results found for: CKTOTAL, CKMB, CKMBINDEX, TROPONINI Lab Results  Component Value Date   CHOL 124 08/27/2019   Lab Results  Component Value Date   HDL 30 (L) 08/27/2019   Lab Results  Component Value Date   LDLCALC 71 08/27/2019   Lab Results  Component Value Date   TRIG 113 08/27/2019   Lab Results  Component Value Date   CHOLHDL 4.1 08/27/2019   No results found for: LDLDIRECT  No results found for: DDIMER   Radiology/Studies:   Ct Angio Head W Or Wo Contrast Result Date: 08/26/2019 CLINICAL DATA:  Right facial droop and slurred speech. EXAM: CT ANGIOGRAPHY HEAD AND NECK TECHNIQUE: Multidetector CT imaging of the head and neck was performed using the  standard protocol during bolus administration of intravenous contrast. Multiplanar CT image reconstructions and MIPs were obtained to evaluate the vascular anatomy. Carotid stenosis measurements (when applicable) are obtained utilizing NASCET criteria, using the distal internal carotid diameter as the denominator. CONTRAST:  OMNIPAQUE IOHEXOL 350 MG/ML SOLN COMPARISON:  Head CT 04/09/2012 FINDINGS: CT HEAD FINDINGS Brain: There is no evidence of acute infarct, intracranial hemorrhage, mass, midline shift, or extra-axial fluid collection. There are small chronic infarcts in the left cerebellum. Cerebral white matter hypodensities are nonspecific but compatible with mild chronic small vessel ischemic disease. The ventricles and sulci are within normal limits for age. Vascular: Calcified atherosclerosis at the skull base. No hyperdense vessel. Skull: No fracture or focal osseous lesion. Sinuses: Mild mucosal thickening in the right greater than left maxillary sinuses. Clear mastoid air cells. Orbits: Bilateral cataract extraction. Review of the MIP images confirms the above findings CTA NECK FINDINGS Aortic arch: Normal variant 4 vessel aortic arch with the left vertebral artery arising from the arch. Mild atherosclerosis without significant arch vessel origin stenosis. Right carotid system: Patent with mild atherosclerotic plaque most notable at the carotid bifurcation. No evidence of significant stenosis or dissection. Tortuous mid to distal cervical ICA. Left carotid system: Patent with mild atherosclerotic plaque most notable at the carotid bifurcation. No evidence of significant stenosis or dissection. Tortuous mid cervical ICA. Vertebral arteries: Patent with the right being slightly larger than the left. Mild calcified plaque at the right vertebral artery origin. No evidence of significant stenosis or dissection. Skeleton: Moderate diffuse cervical disc degeneration. Other neck: No evidence of cervical  lymphadenopathy or mass. Upper chest: Mild motion artifact through  the lung apices. No consolidation. Review of the MIP images confirms the above findings CTA HEAD FINDINGS Anterior circulation: The internal carotid arteries are patent from skull base to carotid termini with mild atherosclerotic plaque bilaterally not resulting in significant stenosis. ACAs and MCAs are patent with mild irregularity predominantly involving the branch vessels but no evidence of proximal branch occlusion or flow limiting proximal stenosis. There is a mild proximal left M1 stenosis. No aneurysm is identified. Posterior circulation: The intracranial vertebral arteries are patent to the basilar with mild nonstenotic plaque bilaterally. Patent PICA and SCA origins are visualized bilaterally. The basilar artery is widely patent. There is a large left posterior communicating artery with hypoplastic left P1 segment. The PCAs are patent with mild irregularity bilaterally but no significant proximal stenosis. No aneurysm is identified. Venous sinuses: Patent. Anatomic variants: Fetal left PCA. Review of the MIP images confirms the above findings IMPRESSION: 1. No evidence of acute intracranial abnormality. 2. 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 3. Mild atherosclerosis in the head and neck without large vessel occlusion or significant proximal stenosis. Electronically Signed   By: Sebastian AcheAllen  Grady M.D.   On: 08/26/2019 13:05    Mr Brain Wo Contrast Addendum Date: 08/26/2019   ADDENDUM REPORT: 08/26/2019 16:00 ADDENDUM: Study discussed by telephone with provider JAIME WARD on 08/26/2019 at 1552 hours. We discussed that the acute ischemia would result in left side symptoms. But that there is chronic ischemia in the left hemisphere. Electronically Signed   By: Odessa FlemingH  Hall M.D.   On: 08/26/2019 16:00  Result Date: 08/26/2019 CLINICAL DATA:  85 year old male with right facial droop and abnormal speech.  EXAM: MRI HEAD WITHOUT CONTRAST TECHNIQUE: Multiplanar, multiecho pulse sequences of the brain and surrounding structures were obtained without intravenous contrast. COMPARISON:  CTA head and neck earlier today. FINDINGS: Brain: There are several scattered small cortical areas of restricted diffusion along the superolateral right motor strip (series 5, images 89 and 92). Little subcortical white matter involvement. Faint associated FLAIR hyperintensity with questionable petechial hemorrhage (series 12, image 40) but no mass effect. There is also a punctate area of restricted diffusion in the right occipital pole on series 5, image 74. But no other diffusion abnormality in the brain. There are small chronic infarcts in the left cerebellum. No definite chronic blood products. No midline shift, mass effect, evidence of mass lesion, ventriculomegaly, extra-axial collection or acute intracranial hemorrhage. Cervicomedullary junction and pituitary are within normal limits. Scattered small bilateral cerebral white matter T2 and FLAIR hyperintensity +more patchy bilateral periventricular involvement is nonspecific and mild to moderate for age. No chronic cortical encephalomalacia identified. Small chronic lacunar infarct of the left caudate. Small chronic lacunar infarct of the left pons. Vascular: Major intracranial vascular flow voids are preserved. Pneumatized right anterior clinoid process. Skull and upper cervical spine: Negative for age visible cervical spine. Normal bone marrow signal. Sinuses/Orbits: Postoperative changes to both globes, otherwise negative orbits. Mild paranasal sinus mucosal thickening. Other: Visible internal auditory structures appear normal. Mastoids are clear. Scalp and face soft tissues appear negative. IMPRESSION: 1. Positive for several small acute cortical infarcts in the posterior right MCA territory at the motor strip. Questionable petechial hemorrhage but no malignant hemorrhagic  transformation or mass effect. 2. Superimposed punctate acute infarct in the right occipital pole. This might reflect synchronous small vessel disease rather than a recent embolic event. 3. Underlying chronic small vessel disease in the left cerebellum, left caudate, and left pons. Electronically  Signed: By: Odessa FlemingH  Hall M.D. On: 08/26/2019 15:45     Vas [place] Lower Extremity Venous (dvt) Result Date: 08/27/2019  Lower Venous Study Indications: Stroke.  Comparison Study: no prior Performing Technologist: Jeb LeveringJill Parker RDMS, RVT  Examination Guidelines: A complete evaluation includes B-mode imaging, spectral Doppler, color Doppler, and power Doppler as needed of all accessible portions of each vessel. Bilateral testing is considered an integral part of a complete examination. Limited examinations for reoccurring indications may be performed as noted.  neity Properties Thrombus Aging   Summary: Right: There is no evidence of deep vein thrombosis in the lower extremity. No cystic structure found in the popliteal fossa. Left: There is no evidence of deep vein thrombosis in the lower extremity. No cystic structure found in the popliteal fossa.  *See table(s) above for measurements and observations.    Preliminary     12-lead ECG SR All prior EKG's in EPIC reviewed with no documented atrial fibrillation  Telemetry SB/SR  Assessment and Plan:  1. Cryptogenic stroke The patient presents with cryptogenic stroke.  Neurology has not requested TEE.  I spoke at length with the patient about monitoring for afib with either a 30 day event monitor or an implantable loop recorder.  Risks, benefits, and alteratives to implantable loop recorder were discussed with the patient today.   At this time, the patient is very clear in his decision to proceed with implantable loop recorder.   Wound care was reviewed with the patient (keep incision clean and dry for 3 days).  Wound check scheduled for the patient  Please call with  questions.   Renee Norberto SorensonLynn Ursuy, PA-C 08/28/2019   EP Attending  Patient seen and examined. Agree with the findings as noted above. The patient has presented with a cryptogenic stroke. I have discussed the treatment options with the patient and the indications/risks/benefits/goals/expectations of ILR insertion were reviewed and he wishes to proceed.  Leonia ReevesGregg Kamani Magnussen,M.D.

## 2019-08-28 NOTE — Interval H&P Note (Signed)
History and Physical Interval Note:  08/28/2019 2:52 PM  Michael Kidd  has presented today for surgery, with the diagnosis of stroke.  The various methods of treatment have been discussed with the patient and family. After consideration of risks, benefits and other options for treatment, the patient has consented to  Procedure(s): LOOP RECORDER INSERTION (N/A) as a surgical intervention.  The patient's history has been reviewed, patient examined, no change in status, stable for surgery.  I have reviewed the patient's chart and labs.  Questions were answered to the patient's satisfaction.     Cristopher Peru

## 2019-08-28 NOTE — Evaluation (Signed)
Speech Language Pathology Evaluation Patient Details Name: Michael Kidd: [id number] DOB: March 25, 1930 Today's Date: 08/28/2019 Time: 2060-1561 SLP Time Calculation (min) (ACUTE ONLY): 25 min  Problem List:  Patient Active Problem List   Diagnosis Date Noted  . Stroke (HCC) 08/26/2019  . Essential hypertension 08/26/2019  . BPH (benign prostatic hyperplasia) 08/26/2019  . Hyperlipidemia 08/26/2019   Past Medical History:  Past Medical History:  Diagnosis Date  . Arthritis   . Bilateral chronic knee pain   . BPH (benign prostatic hyperplasia)   . Dysthymia   . HTN (hypertension)   . Renal cyst    Past Surgical History:  Past Surgical History:  Procedure Laterality Date  . PERCUTANEOUS CORONARY STENT INTERVENTION (PCI-S)     HPI:  Michael Kidd is an 85 y.o. male  With PMH HTN, dysthymia, BPH who presented to [facility] ED with c/o facial droop and slurred speech. Positive for several small acute cortical infarcts in the posterior right MCA territory at the motor strip. Questionable petechial hemorrhage but no malignant hemorrhagic transformation or mass effect.   Assessment / Plan / Recommendation Clinical Impression  Pt reported that he was living independently prior to admission. He stated that he has a fourth/fifth-grade education and has always had difficulty with memory. He stated that due to this difficulty, his wife has managed his medications and the finances. Pt's wife was contacted and she corroborated these reports citing baseline deficits in cognition related to attention, processing speed, memory, and problem solving.   Pt's motor speech and language skills are within normal limits. He did demonstrate cognitive-linguistic impairments related to memory, attention, complex problem solving, and executive function. However based on reports of the pt and his wife, this is his baseline.  Further acute skilled SLP services are therefore not clinically indicated at this time. Pt,  nursing, and his wife were educated regarding results and recommendations; all parties verbalized understanding as well as agreement with plan of care.    SLP Assessment  SLP Recommendation/Assessment: Patient does not need any further Speech Lanaguage Pathology Services SLP Visit Diagnosis: Cognitive communication deficit (R41.841)    Follow Up Recommendations  None    Frequency and Duration           SLP Evaluation Cognition  Overall Cognitive Status: History of cognitive impairments - at baseline Arousal/Alertness: Awake/alert Orientation Level: Oriented X4 Attention: Sustained;Focused Focused Attention: Appears intact Sustained Attention: Impaired Sustained Attention Impairment: Verbal complex Memory: Impaired Memory Impairment: Retrieval deficit;Storage deficit;Decreased recall of new information(Immediate: 3/3; delayed: 0/3; with cues: 1/3) Awareness: Appears intact Problem Solving: Impaired Problem Solving Impairment: Verbal complex Executive Function: Reasoning Reasoning: Impaired Reasoning Impairment: Verbal complex(Abstraction: 0/3)       Comprehension  Auditory Comprehension Overall Auditory Comprehension: Appears within functional limits for tasks assessed Yes/No Questions: Within Functional Limits Commands: Impaired Two Step Basic Commands: (4/4) Multistep Basic Commands: (1/3) Conversation: Complex Interfering Components: Attention;Processing speed;Working Theatre manager: Within [employer] Reading Comprehension Reading Status: Not tested    Expression Expression Primary Mode of Expression: Verbal Verbal Expression Overall Verbal Expression: Appears within functional limits for tasks assessed Initiation: No impairment Automatic Speech: Day of week;Month of year(WNL) Level of Generative/Spontaneous Verbalization: Conversation Repetition: No impairment Naming: No impairment Pragmatics: No  impairment Interfering Components: Attention   Oral / Motor  Oral Motor/Sensory Function Overall Oral Motor/Sensory Function: Within functional limits Motor Speech Overall Motor Speech: Appears within functional limits for tasks assessed Respiration: Within functional limits Phonation: Normal Resonance: Within functional limits  Articulation: Within functional limitis Intelligibility: Intelligible Motor Planning: Witnin functional limits Motor Speech Errors: Not applicable   [name] I. Hardin Negus, [place], [place] Office number [phone] Pager [place] 08/28/2019, 9:20 AM

## 2019-08-28 NOTE — Progress Notes (Signed)
STROKE TEAM PROGRESS NOTE   INTERVAL HISTORY Pt doing well. No significant neuro deficit. Had loop recorder placed. Eager to go home  OBJECTIVE Vitals:   08/27/19 1640 08/27/19 2014 08/28/19 0019 08/28/19 0423  BP: (!) 181/84 (!) 179/103 (!) 163/78 (!) 170/78  Pulse: 63 74 62 60  Resp: 16 17 16 16   Temp: 97.9 F (36.6 C) 97.7 F (36.5 C) 97.7 F (36.5 C) (!) 97.5 F (36.4 C)  TempSrc: Oral Oral Oral Oral  SpO2: 96% 96% 90% 96%  Weight:      Height:        CBC:  Recent Labs  Lab 08/26/19 1136 08/26/19 1141 08/28/19 0440  WBC 6.0  --  6.7  NEUTROABS 3.4  --  3.7  HGB 14.2 13.9 14.2  HCT 40.9 41.0 40.3  MCV 92.5  --  91.0  PLT 193  --  161    Basic Metabolic Panel:  Recent Labs  Lab 08/26/19 1136 08/26/19 1141 08/28/19 0440  NA 141 142 141  K 4.1 4.0 3.8  CL 107 107 106  CO2 25  --  25  GLUCOSE 116* 112* 111*  BUN 14 16 18   CREATININE 1.01 0.90 1.09  CALCIUM 8.7*  --  8.7*    Lipid Panel:     Component Value Date/Time   CHOL 124 08/27/2019 0532   TRIG 113 08/27/2019 0532   HDL 30 (L) 08/27/2019 0532   CHOLHDL 4.1 08/27/2019 0532   VLDL 23 08/27/2019 0532   LDLCALC 71 08/27/2019 0532   HgbA1c:  Lab Results  Component Value Date   HGBA1C 5.8 (H) 08/27/2019   Urine Drug Screen:     Component Value Date/Time   LABOPIA NONE DETECTED 08/26/2019 1135   COCAINSCRNUR NONE DETECTED 08/26/2019 1135   LABBENZ NONE DETECTED 08/26/2019 1135   AMPHETMU NONE DETECTED 08/26/2019 1135   THCU NONE DETECTED 08/26/2019 1135   LABBARB NONE DETECTED 08/26/2019 1135    Alcohol Level     Component Value Date/Time   ETH <10 08/26/2019 1136    IMAGING  Ct Angio Head W Or Wo Contrast Ct Angio Neck W And/or Wo Contrast 08/26/2019 IMPRESSION:  1. No evidence of acute intracranial abnormality.  2. 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  3. Mild atherosclerosis in the head and neck without large vessel occlusion or  significant proximal stenosis.   Mr Brain Wo Contrast 08/26/2019   ADDENDUM REPORT:  Study discussed by telephone with provider JAIME WARD on 08/26/2019 at 1552 hours. We discussed that the acute ischemia would result in left side symptoms. But that there is chronic ischemia in the left hemisphere.  IMPRESSION:  1. Positive for several small acute cortical infarcts in the posterior right MCA territory at the motor strip. Questionable petechial hemorrhage but no malignant hemorrhagic transformation or mass effect.  2. Superimposed punctate acute infarct in the right occipital pole. This might reflect synchronous small vessel disease rather than a recent embolic event.  3. Underlying chronic small vessel disease in the left cerebellum, left caudate, and left pons.    Vas [place] Lower Extremity Venous (dvt) 08/27/2019 Summary:  Right: There is no evidence of deep vein thrombosis in the lower extremity. No cystic structure found in the popliteal fossa.  Left: There is no evidence of deep vein thrombosis in the lower extremity. No cystic structure found in the popliteal fossa.  Preliminary     Transthoracic Echocardiogram 08/27/2019  1. Left ventricular ejection  fraction, by visual estimation, is 55 to 60%. The left ventricle has normal function. Normal left ventricular size. There is mildly increased left ventricular hypertrophy.  2. Left ventricular diastolic Doppler parameters are consistent with impaired relaxation pattern of LV diastolic filling.  3. Global right ventricle has normal systolic function.The right ventricular size is normal. No increase in right ventricular wall thickness.  4. Left atrial size was normal.  5. Right atrial size was normal.  6. The mitral valve is normal in structure. Mild mitral valve regurgitation. No evidence of mitral stenosis.  7. The tricuspid valve is normal in structure. Tricuspid valve regurgitation was not visualized by color flow Doppler.  8. The aortic  valve is trileaflet with moderately thickened and calcified leaflets. Aortic valve regurgitation was not visualized by color flow Doppler. Moderate aortic valve stenosis.  9. The pulmonic valve was normal in structure. Pulmonic valve regurgitation is not visualized by color flow Doppler. 10. Mildly elevated pulmonary artery systolic pressure. 11. The inferior vena cava is normal in size with greater than 50% respiratory variability, suggesting right atrial pressure of 3 mmHg. 12. Aortic valve mean gradient measures 24.0 mmHg. 13. Aortic valve peak gradient measures 35.0 mmHg.  ECG - SB rate 55 BPM. (See cardiology reading for complete details)   PHYSICAL EXAM  Temp:  [97.5 F (36.4 C)-97.9 F (36.6 C)] 97.5 F (36.4 C) (09/21 0423) Pulse Rate:  [57-74] 60 (09/21 0423) Resp:  [16-17] 16 (09/21 0423) BP: (148-181)/(78-103) 170/78 (09/21 0423) SpO2:  [90 %-97 %] 96 % (09/21 0423) Weight:  [95.3 kg] 95.3 kg (09/20 1312)  General - Well nourished, well developed, in no apparent distress.  Ophthalmologic - fundi not visualized due to noncooperation.  Cardiovascular - Regular rate and rhythm.  Mental Status -  Level of arousal and orientation to time, place, and person were intact. Language including expression, naming, repetition, comprehension was assessed and found intact.  Cranial Nerves II - XII - II - Visual field intact OU. III, IV, VI - Extraocular movements intact. V - Facial sensation intact bilaterally. VII - Facial movement intact bilaterally. VIII - Hearing & vestibular intact bilaterally. X - Palate elevates symmetrically. XI - Chin turning & shoulder shrug intact bilaterally. XII - Tongue protrusion intact.  Motor Strength - The patient's strength was normal in all extremities and pronator drift was absent.  Bulk was normal and fasciculations were absent.   Motor Tone - Muscle tone was assessed at the neck and appendages and was normal.  Reflexes - The patient's  reflexes were symmetrical in all extremities and he had no pathological reflexes.  Sensory - Light touch, temperature/pinprick were assessed and were symmetrical.    Coordination - The patient had normal movements in the hands with no ataxia or dysmetria.  Tremor was absent.  Gait and Station - deferred.   ASSESSMENT/PLAN [name] is a 85 y.o. male with history of HTN, CAD s/p stents, dysthymia, BPH  presenting with facial droop and slurred speech. He did not receive IV t-PA due to late presentation (>4.5 hours from time of onset).  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 fib.   MRI head - several small acute cortical infarcts in the posterior right MCA territory at the motor strip. Superimposed punctate acute infarct in the right occipital pole.    CTA H&N - No evidence of acute intracranial abnormality. Mild chronic small vessel ischemic disease with  small chronic cerebellar infarcts.  Bilateral Lower Extremity Venous Dopplers - negative for DVT  2D Echo EF 55 to 60%  Loop recorder placed   [employer] Virus 2 - negative  LDL - 71  HgbA1c - 5.8  UDS - negative  VTE prophylaxis - Lovenox  aspirin 81 mg daily prior to admission, now on aspirin 81 mg daily and clopidogrel 75 mg daily.  Continue DAPT for 3 weeks and then Plavix alone.  Patient counseled to be compliant with his antithrombotic medications  Ongoing aggressive stroke risk factor management  Therapy recommendations:  HH OT recommended  Disposition:  Pending  Hypertension  Home BP meds: Zestril ; metoprolol  Current BP meds: metoprolol  Stable . Permissive hypertension (OK if < 220/120) but gradually normalize in 3-5 days  . Long-term BP goal normotensive  Hyperlipidemia  Home Lipid lowering medication:  Zocor 40 mg daily  LDL 71, goal < 70  Current lipid lowering medication: Lipitor 40 mg daily  Continue  statin at discharge  Other Stroke Risk Factors  Advanced age  Previous ETOH use  Obesity, Body mass index is 30.13 kg/m., recommend weight loss, diet and exercise as appropriate   Hx stroke/TIA - by imaging  Family hx stroke (father)  Coronary artery disease status post stent  Other Active Problems  Low HDL  Hospital day # 1  Neurology will sign off. Please call with questions. Pt will follow up with stroke clinic NP at [facility] in about 4 weeks. Thanks for the consult.  Marvel Plan, MD PhD Stroke Neurology 08/28/2019 4:53 PM   To contact Stroke Continuity provider, please refer to [email or web address]. After hours, contact General Neurology

## 2019-08-28 NOTE — Discharge Instructions (Signed)
Eating Plan After Stroke A stroke causes damage to the brain cells, which can affect your ability to walk, talk, and even eat. The impact of a stroke is different for everyone, and so is recovery. A good nutrition plan is important for your recovery. It can also lower your risk of another stroke. If you have difficulty chewing and swallowing your food, a dietitian or your stroke care team can help so that you can enjoy eating healthy foods. What are tips for following this plan?  Reading food labels  Choose foods that have less than 300 milligrams (mg) of sodium per serving. Limit your sodium intake to less than 1,500 mg per day.  Avoid foods that have saturated fat and trans fat.  Choose foods that are low in cholesterol. Limit the amount of cholesterol you eat each day to less than 200 mg.  Choose foods that are high in fiber. Eat 20-30 grams (g) of fiber each day.  Avoid foods with added sugar. Check the food label for ingredients such as sugar, corn syrup, honey, fructose, molasses, and cane juice. Shopping  At the grocery store, buy most of your food from areas near the walls of the store. This includes: ? Fresh fruits and vegetables. ? Dry grains, beans, nuts, and seeds. ? Fresh seafood, poultry, lean meats, and eggs. ? Low-fat dairy products.  Buy whole ingredients instead of prepackaged foods.  Buy fresh, in-season fruits and vegetables from local farmers markets.  Buy frozen fruits and vegetables in resealable bags. Cooking  Prepare foods with very little salt. Use herbs or salt-free spices instead.  Cook with heart-healthy oils, such as olive, avocado, canola, soybean, or sunflower oil.  Avoid frying foods. Bake, grill, or broil foods instead.  Remove visible fat and skin from meat and poultry before eating.  Modify food textures as told by your health care provider. Meal planning  Eat a wide variety of colorful fruits and vegetables. Make sure one-half of your  plate is filled with fruits and vegetables at each meal.  Eat fruits and vegetables that are high in potassium, such as: ? Apples, bananas, oranges, and melon. ? Sweet potatoes, spinach, zucchini, and tomatoes.  Eat fish that contain heart-healthy fats (omega-3 fats) at least twice a week. These include salmon, tuna, mackerel, and sardines.  Eat plant foods that are high in omega-3 fats, such as flaxseeds and walnuts. Add these to cereals, yogurt, or pasta dishes.  Eat several servings of high-fiber foods each day, such as fruits, vegetables, whole grains, and beans.  Do not put salt at the table for meals.  When eating out at restaurants: ? Ask the server about low-salt or salt-free food options. ? Avoid fried foods. Look for menu items that are grilled, steamed, broiled, or roasted. ? Ask if your food can be prepared without butter. ? Ask for condiments, such as salad dressings, gravy, or sauces to be served on the side.  If you have difficulty swallowing: ? Choose foods that are softer and easier to chew and swallow. ? Cut foods into small pieces and chew well before swallowing. ? Thicken liquids as told by your health care provider or dietitian. ? Let your health care provider know if your condition does not improve over time. You may need to work with a speech therapist to re-train the muscles that are used for eating. General recommendations  Involve your family and friends in your recovery, if possible. It may be helpful to have a slower meal  time and to plan meals that include foods everyone in the family can eat.  Brush your teeth with fluoride toothpaste twice a day, and floss once a day. Keeping a clean mouth can help you swallow and can also help your appetite.  Drink enough water each day to keep your urine pale yellow. If needed, set reminders or ask your family to help you remember to drink water.  Limit alcohol intake to no more than 1 drink a day for nonpregnant  women and 2 drinks a day for men. One drink equals 12 oz of beer, 5 oz of wine, or 1 oz of hard liquor. Summary  Following this eating plan can help in your stroke recovery and can decrease your risk for another stroke.  Let your health care provider know if you have problems with swallowing. You may need to work with a speech therapist. This information is not intended to replace advice given to you by your health care provider. Make sure you discuss any questions you have with your health care provider. Document Released: 01/31/2018 Document Revised: 03/16/2019 Document Reviewed: 01/31/2018 Elsevier Patient Education  2020 [employer].   Cognitive Rehabilitation After a Stroke After a stroke, you may have various problems with thinking (cognitive disability). The types of problems you have will depend on how severe the stroke was and where it was located in the brain. Problems may include:  Problems with short-term memory.  Trouble paying attention.  Trouble communicating or understanding language (aphasia).  A drop in mental ability that may interfere with daily life (dementia).  Trouble with problem-solving and information processing.  Problems with reading, writing, or math.  Problems with your ability to plan and to perform activities in sequence (executive function). These problems can feel overwhelming. However, with rehabilitation and time to heal, many people have improvement in their symptoms. What causes cognitive disability? A stroke happens when blood cannot flow to certain areas of the brain. When this happens, brain cells die in the affected areas because they cannot get oxygen and nutrients from the blood. Cognitive disability is caused by the death of cells in the areas of the brain that control thinking. What is cognitive rehabilitation? Cognitive rehabilitation is a program to help you improve your thinking skills after a stroke. Rehabilitation cannot completely  reverse the effects of a stroke, but it can help you with memory, problem-solving, and communication skills. Therapy focuses on:  Improving brain function. This may involve activities such as learning to break down tasks into simple steps.  Helping you learn ways to cope with thinking problems. For example, you might learn memory tricks or do activities that stimulate memory, such as naming objects or describing pictures. Cognitive rehabilitation may include:  Speech-language therapy to help you understand and use language to communicate.  Occupational therapy to help you perform daily activities.  Music therapy to help relieve stress, anxiety, and depression. This may involve listening to music, singing, or playing instruments.  Physical therapy to help improve your ability to move and perform actions that involve the muscles (motor functions). When will therapy start and where will I have therapy? Your health care provider will decide when it is best for you to start therapy. In some cases, people start rehabilitation as soon as their health is stable, which may be 24-48 hours after the stroke. Rehabilitation can take place in a few different places, based on your needs. It may take place in:  The hospital or an in-patient rehabilitation hospital.  An outpatient rehabilitation facility.  A long-term care facility.  A community rehabilitation clinic.  Your home. What are some tools to help after a stroke? There are a number of tools and apps that you can use on your smartphone, personal computer, or tablet to help improve brain function. Some of these apps include:  Calendar reminders or alarm apps to help with memory.  Note-taking or sketch pad apps to help with memory or communication.  Text-to-speech apps that allow you to listen to what you are reading, which helps your ability to understanding text.  Picture dictionary or picture message apps to help with  communication.  E-readers. These can highlight text as it is read aloud, which helps with listening and reading skills. How can my friends or family help during my rehabilitation? During your recovery, it is important that your friends and family members help you work toward more independence. Your caregivers should speak with your health care providers to learn how they can best help you during recovery. This may include working on speech-language or memory exercises at home, or helping with daily tasks and errands. If you have cognitive disability, you may be at risk for injury or accidents at home, such as forgetting to turn off the stove. Friends and family members can help ensure home safety by taking steps such as getting appliances with automatic shut-off features or storing dangerous objects in a secure place. What else should I know about cognitive rehabilitation after a stroke? Having trouble with memory and problem-solving can make you feel alone. You may also have mood changes, anxiety, or depression after a stroke. It is important to:  Stay connected with others through social groups, online support groups, or your community.  Talk to your friends, family, and caregivers about any emotional problems you are having.  Go to one-on-one or group therapy as suggested by your health care provider.  Stay physically active and exercise as often as suggested by your health care provider. Summary  After a stroke, some people have problems with thinking that affect attention, memory, language, communication, and problem-solving.  Cognitive rehabilitation is a program to help you regain brain function and learn skills to cope with thinking problems.  Rehabilitation cannot completely reverse the effects of a stroke, but it can help to improve quality of life.  Cognitive rehabilitation may include speech-language therapy, occupational therapy, music therapy, and physical therapy. This  information is not intended to replace advice given to you by your health care provider. Make sure you discuss any questions you have with your health care provider. Document Released: 02/26/2017 Document Revised: 03/15/2019 Document Reviewed: 02/26/2017 Elsevier Patient Education  [place]. Implant site wound care instructions Keep incision clean and dry for 3 days. No driving for 2 days.  Leave steri-strips (little pieces of tape) on until seen in the office for wound check appointment. Call the office [phone]) for redness, drainage, swelling, or fever.

## 2019-08-28 NOTE — Consult Note (Addendum)
ELECTROPHYSIOLOGY CONSULT NOTE  Patient ID: Michael Kidd MRN: [id number], DOB/AGE: Dec 19, 1929   Admit date: 08/26/2019 Date of Consult: 08/28/2019  Primary Physician: System, Pcp Not In Primary Cardiologist: none Reason for Consultation: Cryptogenic stroke - recommendations regarding Implantable Loop Recorder, requested by Dr. Roda Shutters  History of Present Illness Michael Kidd was admitted on 08/26/2019 with stroke.  They first developed symptoms while at home.   PMHx includes CAD (prior stents "many years ago, does not follow with any cardiologist), HLD, BPH, HTN, dythymia  Neurology noted: Small right motor strip infarcts and MCA/PCA punctate infarct - embolic - source unknown, concerning for occult A. fib   he has undergone workup for stroke including echocardiogram and carotid angio, negative LE venous US for DVTs b/l, neurology has not requested TEE.  The patient has been monitored on telemetry which has demonstrated sinus rhythm with no arrhythmias.     Echocardiogram this admission demonstrated   IMPRESSIONS  1. Left ventricular ejection fraction, by visual estimation, is 55 to 60%. The left ventricle has normal function. Normal left ventricular size. There is mildly increased left ventricular hypertrophy.  2. Left ventricular diastolic Doppler parameters are consistent with impaired relaxation pattern of LV diastolic filling.  3. Global right ventricle has normal systolic function.The right ventricular size is normal. No increase in right ventricular wall thickness.  4. Left atrial size was normal.  5. Right atrial size was normal.  6. The mitral valve is normal in structure. Mild mitral valve regurgitation. No evidence of mitral stenosis.  7. The tricuspid valve is normal in structure. Tricuspid valve regurgitation was not visualized by color flow Doppler.  8. The aortic valve is trileaflet with moderately thickened and calcified leaflets. Aortic valve regurgitation was not visualized  by color flow Doppler. Moderate aortic valve stenosis.  9. The pulmonic valve was normal in structure. Pulmonic valve regurgitation is not visualized by color flow Doppler. 10. Mildly elevated pulmonary artery systolic pressure. 11. The inferior vena cava is normal in size with greater than 50% respiratory variability, suggesting right atrial pressure of 3 mmHg. 12. Aortic valve mean gradient measures 24.0 mmHg. 13. Aortic valve peak gradient measures 35.0 mmHg.   Lab work is reviewed.  Prior to admission, the patient denies chest pain, shortness of breath, dizziness, palpitations, or syncope.  They are recovering from their stroke with plans to home at discharge.   Past Medical History:  Diagnosis Date   Arthritis    Bilateral chronic knee pain    BPH (benign prostatic hyperplasia)    Dysthymia    HTN (hypertension)    Renal cyst      Surgical History:  Past Surgical History:  Procedure Laterality Date   PERCUTANEOUS CORONARY STENT INTERVENTION (PCI-S)       Medications Prior to Admission  Medication Sig Dispense Refill Last Dose   acetaminophen (TYLENOL) 650 MG CR tablet Take 650 mg by mouth every 8 (eight) hours as needed for pain.   08/26/2019 at prn   aspirin EC 81 MG tablet Take 81 mg by mouth daily.   08/26/2019 at Unknown time   finasteride (PROSCAR) 5 MG tablet Take 5 mg by mouth daily.   08/26/2019 at Unknown time   lisinopril (ZESTRIL) 5 MG tablet Take 5 mg by mouth daily.   08/26/2019 at Unknown time   metoprolol tartrate (LOPRESSOR) 25 MG tablet Take 12.5 mg by mouth 2 (two) times daily.   08/26/2019 at 0800   omeprazole (PRILOSEC) 20 MG capsule Take  20 mg by mouth daily.   08/26/2019 at Unknown time   simvastatin (ZOCOR) 40 MG tablet Take 40 mg by mouth daily.   08/26/2019 at Unknown time   tamsulosin (FLOMAX) 0.4 MG CAPS capsule Take 0.4 mg by mouth daily.   08/26/2019 at Unknown time    Inpatient Medications:   aspirin EC  81 mg Oral Daily    atorvastatin  40 mg Oral q1800   clopidogrel  75 mg Oral Daily   enoxaparin (LOVENOX) injection  40 mg Subcutaneous Q24H   finasteride  5 mg Oral Daily   metoprolol tartrate  12.5 mg Oral BID   pantoprazole  40 mg Oral Daily   tamsulosin  0.4 mg Oral Daily    Allergies: No Known Allergies  Social History   Socioeconomic History   Marital status: Married    Spouse name: Not on file   Number of children: Not on file   Years of education: Not on file   Highest education level: Not on file  Occupational History   Not on file  Social Needs   Financial resource strain: Not on file   Food insecurity    Worry: Not on file    Inability: Not on file   Transportation needs    Medical: Not on file    Non-medical: Not on file  Tobacco Use   Smoking status: Never Smoker   Smokeless tobacco: Never Used  Substance and Sexual Activity   Alcohol use: Not Currently   Drug use: Never   Sexual activity: Not on file  Lifestyle   Physical activity    Days per week: Not on file    Minutes per session: Not on file   Stress: Not on file  Relationships   Social connections    Talks on phone: Not on file    Gets together: Not on file    Attends religious service: Not on file    Active member of club or organization: Not on file    Attends meetings of clubs or organizations: Not on file    Relationship status: Not on file   Intimate partner violence    Fear of current or ex partner: Not on file    Emotionally abused: Not on file    Physically abused: Not on file    Forced sexual activity: Not on file  Other Topics Concern   Not on file  Social History Narrative   Not on file     Family History  Problem Relation Age of Onset   Diabetes Mother    Stroke Father       Review of Systems: All other systems reviewed and are otherwise negative except as noted above.  Physical Exam: Vitals:   08/27/19 2014 08/28/19 0019 08/28/19 0423 08/28/19 0916  BP:  (!) 179/103 (!) 163/78 (!) 170/78 (!) 150/78  Pulse: 74 62 60 70  Resp: 17 16 16 20   Temp: 97.7 F (36.5 C) 97.7 F (36.5 C) (!) 97.5 F (36.4 C) (!) 97.4 F (36.3 C)  TempSrc: Oral Oral Oral Oral  SpO2: 96% 90% 96% 97%  Weight:      Height:        GEN- The patient is well appearing, alert and oriented x 3 today.   Head- normocephalic, atraumatic Eyes-  Sclera clear, conjunctiva pink Ears- hearing intact Oropharynx- clear Neck- supple Lungs- CTA b/l, normal work of breathing Heart- RRR, no murmurs, rubs or gallops  GI- soft, NT, ND Extremities- no clubbing,  cyanosis, or edema °MS- no significant deformity or atrophy °Skin- no rash or lesion °Psych- euthymic mood, full affect ° ° °Labs: °  °Lab Results  °Component Value Date  ° WBC 6.7 08/28/2019  ° HGB 14.2 08/28/2019  ° HCT 40.3 08/28/2019  ° MCV 91.0 08/28/2019  ° PLT 202 08/28/2019  °  °Recent Labs  °Lab 08/26/19 °1136  08/28/19 °0440  °NA 141   < > 141  °K 4.1   < > 3.8  °CL 107   < > 106  °CO2 25  --  25  °BUN 14   < > 18  °CREATININE 1.01   < > 1.09  °CALCIUM 8.7*  --  8.7*  °PROT 6.1*  --   --   °BILITOT 1.1  --   --   °ALKPHOS 69  --   --   °ALT 17  --   --   °AST 24  --   --   °GLUCOSE 116*   < > 111*  ° < > = values in this interval not displayed.  ° °No results found for: CKTOTAL, CKMB, CKMBINDEX, TROPONINI °Lab Results  °Component Value Date  ° CHOL 124 08/27/2019  ° °Lab Results  °Component Value Date  ° HDL 30 (L) 08/27/2019  ° °Lab Results  °Component Value Date  ° LDLCALC 71 08/27/2019  ° °Lab Results  °Component Value Date  ° TRIG 113 08/27/2019  ° °Lab Results  °Component Value Date  ° CHOLHDL 4.1 08/27/2019  ° °No results found for: LDLDIRECT  °No results found for: DDIMER °  °Radiology/Studies:  ° °Ct Angio Head W Or Wo Contrast °Result Date: 08/26/2019 °CLINICAL DATA:  Right facial droop and slurred speech. EXAM: CT ANGIOGRAPHY HEAD AND NECK TECHNIQUE: Multidetector CT imaging of the head and neck was performed using the  standard protocol during bolus administration of intravenous contrast. Multiplanar CT image reconstructions and MIPs were obtained to evaluate the vascular anatomy. Carotid stenosis measurements (when applicable) are obtained utilizing NASCET criteria, using the distal internal carotid diameter as the denominator. CONTRAST:  100mL OMNIPAQUE IOHEXOL 350 MG/ML SOLN COMPARISON:  Head CT 04/09/2012 FINDINGS: CT HEAD FINDINGS Brain: There is no evidence of acute infarct, intracranial hemorrhage, mass, midline shift, or extra-axial fluid collection. There are small chronic infarcts in the left cerebellum. Cerebral white matter hypodensities are nonspecific but compatible with mild chronic small vessel ischemic disease. The ventricles and sulci are within normal limits for age. Vascular: Calcified atherosclerosis at the skull base. No hyperdense vessel. Skull: No fracture or focal osseous lesion. Sinuses: Mild mucosal thickening in the right greater than left maxillary sinuses. Clear mastoid air cells. Orbits: Bilateral cataract extraction. Review of the MIP images confirms the above findings CTA NECK FINDINGS Aortic arch: Normal variant 4 vessel aortic arch with the left vertebral artery arising from the arch. Mild atherosclerosis without significant arch vessel origin stenosis. Right carotid system: Patent with mild atherosclerotic plaque most notable at the carotid bifurcation. No evidence of significant stenosis or dissection. Tortuous mid to distal cervical ICA. Left carotid system: Patent with mild atherosclerotic plaque most notable at the carotid bifurcation. No evidence of significant stenosis or dissection. Tortuous mid cervical ICA. Vertebral arteries: Patent with the right being slightly larger than the left. Mild calcified plaque at the right vertebral artery origin. No evidence of significant stenosis or dissection. Skeleton: Moderate diffuse cervical disc degeneration. Other neck: No evidence of cervical  lymphadenopathy or mass. Upper chest: Mild motion artifact through   the lung apices. No consolidation. Review of the MIP images confirms the above findings CTA HEAD FINDINGS Anterior circulation: The internal carotid arteries are patent from skull base to carotid termini with mild atherosclerotic plaque bilaterally not resulting in significant stenosis. ACAs and MCAs are patent with mild irregularity predominantly involving the branch vessels but no evidence of proximal branch occlusion or flow limiting proximal stenosis. There is a mild proximal left M1 stenosis. No aneurysm is identified. Posterior circulation: The intracranial vertebral arteries are patent to the basilar with mild nonstenotic plaque bilaterally. Patent PICA and SCA origins are visualized bilaterally. The basilar artery is widely patent. There is a large left posterior communicating artery with hypoplastic left P1 segment. The PCAs are patent with mild irregularity bilaterally but no significant proximal stenosis. No aneurysm is identified. Venous sinuses: Patent. Anatomic variants: Fetal left PCA. Review of the MIP images confirms the above findings IMPRESSION: 1. No evidence of acute intracranial abnormality. 2. 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 3. Mild atherosclerosis in the head and neck without large vessel occlusion or significant proximal stenosis. Electronically Signed   By: Sebastian AcheAllen  Grady M.D.   On: 08/26/2019 13:05    Mr Brain Wo Contrast Addendum Date: 08/26/2019   ADDENDUM REPORT: 08/26/2019 16:00 ADDENDUM: Study discussed by telephone with provider JAIME WARD on 08/26/2019 at 1552 hours. We discussed that the acute ischemia would result in left side symptoms. But that there is chronic ischemia in the left hemisphere. Electronically Signed   By: Odessa FlemingH  Hall M.D.   On: 08/26/2019 16:00  Result Date: 08/26/2019 CLINICAL DATA:  85 year old male with right facial droop and abnormal speech.  EXAM: MRI HEAD WITHOUT CONTRAST TECHNIQUE: Multiplanar, multiecho pulse sequences of the brain and surrounding structures were obtained without intravenous contrast. COMPARISON:  CTA head and neck earlier today. FINDINGS: Brain: There are several scattered small cortical areas of restricted diffusion along the superolateral right motor strip (series 5, images 89 and 92). Little subcortical white matter involvement. Faint associated FLAIR hyperintensity with questionable petechial hemorrhage (series 12, image 40) but no mass effect. There is also a punctate area of restricted diffusion in the right occipital pole on series 5, image 74. But no other diffusion abnormality in the brain. There are small chronic infarcts in the left cerebellum. No definite chronic blood products. No midline shift, mass effect, evidence of mass lesion, ventriculomegaly, extra-axial collection or acute intracranial hemorrhage. Cervicomedullary junction and pituitary are within normal limits. Scattered small bilateral cerebral white matter T2 and FLAIR hyperintensity +more patchy bilateral periventricular involvement is nonspecific and mild to moderate for age. No chronic cortical encephalomalacia identified. Small chronic lacunar infarct of the left caudate. Small chronic lacunar infarct of the left pons. Vascular: Major intracranial vascular flow voids are preserved. Pneumatized right anterior clinoid process. Skull and upper cervical spine: Negative for age visible cervical spine. Normal bone marrow signal. Sinuses/Orbits: Postoperative changes to both globes, otherwise negative orbits. Mild paranasal sinus mucosal thickening. Other: Visible internal auditory structures appear normal. Mastoids are clear. Scalp and face soft tissues appear negative. IMPRESSION: 1. Positive for several small acute cortical infarcts in the posterior right MCA territory at the motor strip. Questionable petechial hemorrhage but no malignant hemorrhagic  transformation or mass effect. 2. Superimposed punctate acute infarct in the right occipital pole. This might reflect synchronous small vessel disease rather than a recent embolic event. 3. Underlying chronic small vessel disease in the left cerebellum, left caudate, and left pons. Electronically  Signed: By: Odessa FlemingH  Hall M.D. On: 08/26/2019 15:45     Vas [place] Lower Extremity Venous (dvt) Result Date: 08/27/2019  Lower Venous Study Indications: Stroke.  Comparison Study: no prior Performing Technologist: Jeb LeveringJill Parker RDMS, RVT  Examination Guidelines: A complete evaluation includes B-mode imaging, spectral Doppler, color Doppler, and power Doppler as needed of all accessible portions of each vessel. Bilateral testing is considered an integral part of a complete examination. Limited examinations for reoccurring indications may be performed as noted.  neity Properties Thrombus Aging   Summary: Right: There is no evidence of deep vein thrombosis in the lower extremity. No cystic structure found in the popliteal fossa. Left: There is no evidence of deep vein thrombosis in the lower extremity. No cystic structure found in the popliteal fossa.  *See table(s) above for measurements and observations.    Preliminary     12-lead ECG SR All prior EKG's in EPIC reviewed with no documented atrial fibrillation  Telemetry SB/SR  Assessment and Plan:  1. Cryptogenic stroke The patient presents with cryptogenic stroke.  Neurology has not requested TEE.  I spoke at length with the patient about monitoring for afib with either a 30 day event monitor or an implantable loop recorder.  Risks, benefits, and alteratives to implantable loop recorder were discussed with the patient today.   At this time, the patient is very clear in his decision to proceed with implantable loop recorder.   Wound care was reviewed with the patient (keep incision clean and dry for 3 days).  Wound check scheduled for the patient  Please call with  questions.   Renee Norberto SorensonLynn Ursuy, PA-C 08/28/2019   EP Attending  Patient seen and examined. Agree with the findings as noted above. The patient has presented with a cryptogenic stroke. I have discussed the treatment options with the patient and the indications/risks/benefits/goals/expectations of ILR insertion were reviewed and he wishes to proceed.  Leonia ReevesGregg Darlisha Kelm,M.D.

## 2019-08-28 NOTE — TOC Transition Note (Signed)
Transition of Care [facility]) - CM/SW Discharge Note   Patient Details  Name: Michael Kidd MRN: [id number] Date of Birth: 22-May-1930  Transition of Care [facility]) CM/SW Contact:  Pollie Friar, RN Phone Number: 08/28/2019, 4:20 PM   Clinical Narrative:    Pt is discharging home with [facility] services through [facility]. Information requested faxed to [facility]. Wife is working on transportation home.    Final next level of care: Home w Home Health Services Barriers to Discharge: No Barriers Identified   Patient Goals and CMS Choice   CMS Medicare.gov Compare Post Acute Care list provided to:: Patient Represenative (must comment) Choice offered to / list presented to : Patient, Spouse  Discharge Placement                       Discharge Plan and Services                          HH Arranged: PT, OT [facility] Agency: [facility] Date [place]: 08/28/19      Social Determinants of Health (SDOH) Interventions     Readmission Risk Interventions No flowsheet data found.

## 2019-08-28 NOTE — Discharge Summary (Signed)
Physician Discharge Summary  Michael Kidd ZOX:[id number] DOB: [birth year] DOA: 08/26/2019  PCP: System, Pcp Not In  Admit date: 08/26/2019 Discharge date: 08/28/2019  Admitted From: Home Disposition: Home  Recommendations for Outpatient Follow-up:  1. Follow up with PCP in 1-2 weeks 2. Please obtain BMP/CBC in one week 3. Please follow up on the following pending results:  Home Health: Yes- PT/OT Equipment/Devices: None   Discharge Condition: Stable CODE STATUS: FULL Diet recommendation: Heart Healthy  Brief/Interim Summary: Per HPI:85 y.o.malewith medical history significant ofhypertension, dysthymia,coronary artery disease status post stents,hyperlipidemia, BPH, brought by EMS to emergency department for the concern of slurred speech and right facial droop which started suddenly in the am on day of arrival. Pt lives with his wife at home. He is independent on daily life activities. In the ED, CT angiogram of head and neck was obtained in the ED which came back negative for acute findings. Neurology was consulted by EDP for further evaluation and management.  Patient admitted for further management.  Patient was admitted, MRI showed small right infarcts in MCA/PCA punctate infarcts.  Seen by neurology.  Plan is for loop recorder then discharge home on DAPT for 3 weeks then Plavix alone and no further PT OT or speech needs.   Discharge Diagnoses:  Active Problems:   Stroke [facility])   Essential hypertension   BPH (benign prostatic hyperplasia)   Hyperlipidemia  Small right infarcts and MCA/PCA punctate infarcts MRI brain shows several small acute cortical infarcts in the posterior right MCA, superimposed punctate acute infarct in the right occipital pole CTA head/neck showed no evidence of acute intracranial abnormality Bilateral lower extremity Doppler negative for DVT Echo showed EF 55 to 60% LDL 71 A1c 5.8 Neurology consulted recommended loop recorder to be placed on  08/27/2021 rule out A. Fib.  Continue DAPT for 3 weeks and then Plavix alone PT no further recommendation, OT recommend home health OT  CAD: stable, cont aspirin, statin  Hypertension: controlled. Continue metoprolol, hold home Zestril  Hyperlipidemia:LDL 71, switch to Lipitor  WJX:BJYNWG,NFAOZHYQ Flomax, Proscar  Imaging: Ct Angio Head W Or Wo Contrast Ct Angio Neck W And/or Wo Contrast 08/26/2019 IMPRESSION:  1. No evidence of acute intracranial abnormality.  2. 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  3. Mild atherosclerosis in the head and neck without large vessel occlusion or significant proximal stenosis.   Mr Brain Wo Contrast 08/26/2019   ADDENDUM REPORT:  Study discussed by telephone with provider JAIME WARD on 08/26/2019 at 1552 hours. We discussed that the acute ischemia would result in left side symptoms. But that there is chronic ischemia in the left hemisphere.  IMPRESSION:  1. Positive for several small acute cortical infarcts in the posterior right MCA territory at the motor strip. Questionable petechial hemorrhage but no malignant hemorrhagic transformation or mass effect.  2. Superimposed punctate acute infarct in the right occipital pole. This might reflect synchronous small vessel disease rather than a recent embolic event.  3. Underlying chronic small vessel disease in the left cerebellum, left caudate, and left pons.    Vas [place] Lower Extremity Venous (dvt) 08/27/2019 Summary:  Right: There is no evidence of deep vein thrombosis in the lower extremity. No cystic structure found in the popliteal fossa.  Left: There is no evidence of deep vein thrombosis in the lower extremity. No cystic structure found in the popliteal fossa.  Preliminary     Transthoracic Echocardiogram 08/27/2019 1. Left ventricular ejection fraction, by visual estimation,  is 55 to 60%. The left ventricle has normal function. Normal left  ventricular size. There is mildly increased left ventricular hypertrophy. 2. Left ventricular diastolic Doppler parameters are consistent with impaired relaxation pattern of LV diastolic filling. 3. Global right ventricle has normal systolic function.The right ventricular size is normal. No increase in right ventricular wall thickness. 4. Left atrial size was normal. 5. Right atrial size was normal. 6. The mitral valve is normal in structure. Mild mitral valve regurgitation. No evidence of mitral stenosis. 7. The tricuspid valve is normal in structure. Tricuspid valve regurgitation was not visualized by color flow Doppler. 8. The aortic valve is trileaflet with moderately thickened and calcified leaflets. Aortic valve regurgitation was not visualized by color flow Doppler. Moderate aortic valve stenosis. 9. The pulmonic valve was normal in structure. Pulmonic valve regurgitation is not visualized by color flow Doppler. 10. Mildly elevated pulmonary artery systolic pressure. 11. The inferior vena cava is normal in size with greater than 50% respiratory variability, suggesting right atrial pressure of 3 mmHg. 12. Aortic valve mean gradient measures 24.0 mmHg. 13. Aortic valve peak gradient measures 35.0 mmHg.  ECG - SB rate 55 BPM. (See cardiology reading for complete details)  Discharge Instructions  Discharge Instructions    Ambulatory referral to Neurology   Complete by: As directed    Follow up with stroke clinic NP (Jessica Vanschaick or Darrol Angelarolyn Martin, if both not available, consider Manson AllanSethi, Penumali, or Ahern) [facility] weeks. Thanks.   Diet - low sodium heart healthy   Complete by: As directed    Discharge instructions   Complete by: As directed    Please call call MD or return to ER for similar or worsening recurring problem that brought you to hospital or if any fever,nausea/vomiting,abdominal pain, uncontrolled pain, chest pain,  shortness of breath or any other  alarming symptoms.  Please follow-up your doctor as instructed in a week time, and also with the neurologist.  call the office for appointment. Please take as aspirin and plavix for 3 weeks after which stop aspirin and continue only Plavix.  Please avoid alcohol, smoking, or any other illicit substance and maintain healthy habits including taking your regular medications as prescribed.  You were cared for by a hospitalist during your hospital stay. If you have any questions about your discharge medications or the care you received while you were in the hospital after you are discharged, you can call the unit and ask to speak with the hospitalist on call if the hospitalist that took care of you is not available.  Once you are discharged, your primary care physician will handle any further medical issues. Please note that NO REFILLS for any discharge medications will be authorized once you are discharged, as it is imperative that you return to your primary care physician (or establish a relationship with a primary care physician if you do not have one) for your aftercare needs so that they can reassess your need for medications and monitor your lab values   Increase activity slowly   Complete by: As directed      Allergies as of 08/28/2019   No Known Allergies     Medication List    STOP taking these medications   simvastatin 40 MG tablet Commonly known as: ZOCOR     TAKE these medications   acetaminophen 650 MG CR tablet Commonly known as: TYLENOL Take 650 mg by mouth every 8 (eight) hours as needed for pain.  aspirin EC 81 MG tablet Take 1 tablet (81 mg total) by mouth daily for 21 days. Notes to patient: Take 08/29/2019   atorvastatin 40 MG tablet Commonly known as: LIPITOR Take 1 tablet (40 mg total) by mouth daily at 6 PM.   clopidogrel 75 MG tablet Commonly known as: PLAVIX Take 1 tablet (75 mg total) by mouth daily. Start taking on: August 29, 2019   finasteride 5  MG tablet Commonly known as: PROSCAR Take 5 mg by mouth daily. Notes to patient: Take 08/29/2019   lisinopril 5 MG tablet Commonly known as: ZESTRIL Take 5 mg by mouth daily. Notes to patient: Take 08/29/2019   metoprolol tartrate 25 MG tablet Commonly known as: LOPRESSOR Take 12.5 mg by mouth 2 (two) times daily.   omeprazole 20 MG capsule Commonly known as: PRILOSEC Take 20 mg by mouth daily. Notes to patient: Take 08/29/2019   tamsulosin 0.4 MG Caps capsule Commonly known as: FLOMAX Take 0.4 mg by mouth daily. Notes to patient: Take 08/29/2019      Follow-up Information    Sheilah Pigeon, PA-C Follow up.   Specialty: Cardiology Why: 09/12/2019 @ 11:45AM, heart monitor implant follow up visit Contact information: [address] [place] [address] [phone]        [facility]. Schedule an appointment as soon as possible for a visit in 4 week(s).   Specialty: Neurology Contact information: [address] [place] [address] [phone]         No Known Allergies  Consultations: neurology Procedures/Studies: Ct Angio Head W Or Wo Contrast  Result Date: 08/26/2019 CLINICAL DATA:  Right facial droop and slurred speech. EXAM: CT ANGIOGRAPHY HEAD AND NECK TECHNIQUE: Multidetector CT imaging of the head and neck was performed using the standard protocol during bolus administration of intravenous contrast. Multiplanar CT image reconstructions and MIPs were obtained to evaluate the vascular anatomy. Carotid stenosis measurements (when applicable) are obtained utilizing NASCET criteria, using the distal internal carotid diameter as the denominator. CONTRAST:  OMNIPAQUE IOHEXOL 350 MG/ML SOLN COMPARISON:  Head CT 04/09/2012 FINDINGS: CT HEAD FINDINGS Brain: There is no evidence of acute infarct, intracranial hemorrhage, mass, midline shift, or extra-axial fluid collection. There are small chronic infarcts in  the left cerebellum. Cerebral white matter hypodensities are nonspecific but compatible with mild chronic small vessel ischemic disease. The ventricles and sulci are within normal limits for age. Vascular: Calcified atherosclerosis at the skull base. No hyperdense vessel. Skull: No fracture or focal osseous lesion. Sinuses: Mild mucosal thickening in the right greater than left maxillary sinuses. Clear mastoid air cells. Orbits: Bilateral cataract extraction. Review of the MIP images confirms the above findings CTA NECK FINDINGS Aortic arch: Normal variant 4 vessel aortic arch with the left vertebral artery arising from the arch. Mild atherosclerosis without significant arch vessel origin stenosis. Right carotid system: Patent with mild atherosclerotic plaque most notable at the carotid bifurcation. No evidence of significant stenosis or dissection. Tortuous mid to distal cervical ICA. Left carotid system: Patent with mild atherosclerotic plaque most notable at the carotid bifurcation. No evidence of significant stenosis or dissection. Tortuous mid cervical ICA. Vertebral arteries: Patent with the right being slightly larger than the left. Mild calcified plaque at the right vertebral artery origin. No evidence of significant stenosis or dissection. Skeleton: Moderate diffuse cervical disc degeneration. Other neck: No evidence of cervical lymphadenopathy or mass. Upper chest: Mild motion artifact through the lung apices. No consolidation. Review  of the MIP images confirms the above findings CTA HEAD FINDINGS Anterior circulation: The internal carotid arteries are patent from skull base to carotid termini with mild atherosclerotic plaque bilaterally not resulting in significant stenosis. ACAs and MCAs are patent with mild irregularity predominantly involving the branch vessels but no evidence of proximal branch occlusion or flow limiting proximal stenosis. There is a mild proximal left M1 stenosis. No aneurysm is  identified. Posterior circulation: The intracranial vertebral arteries are patent to the basilar with mild nonstenotic plaque bilaterally. Patent PICA and SCA origins are visualized bilaterally. The basilar artery is widely patent. There is a large left posterior communicating artery with hypoplastic left P1 segment. The PCAs are patent with mild irregularity bilaterally but no significant proximal stenosis. No aneurysm is identified. Venous sinuses: Patent. Anatomic variants: Fetal left PCA. Review of the MIP images confirms the above findings IMPRESSION: 1. No evidence of acute intracranial abnormality. 2. 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 3. Mild atherosclerosis in the head and neck without large vessel occlusion or significant proximal stenosis. Electronically Signed   By: Sebastian AcheAllen  Grady M.D.   On: 08/26/2019 13:05   Ct Angio Neck W And/or Wo Contrast  Result Date: 08/26/2019 CLINICAL DATA:  Right facial droop and slurred speech. EXAM: CT ANGIOGRAPHY HEAD AND NECK TECHNIQUE: Multidetector CT imaging of the head and neck was performed using the standard protocol during bolus administration of intravenous contrast. Multiplanar CT image reconstructions and MIPs were obtained to evaluate the vascular anatomy. Carotid stenosis measurements (when applicable) are obtained utilizing NASCET criteria, using the distal internal carotid diameter as the denominator. CONTRAST:  100mL OMNIPAQUE IOHEXOL 350 MG/ML SOLN COMPARISON:  Head CT 04/09/2012 FINDINGS: CT HEAD FINDINGS Brain: There is no evidence of acute infarct, intracranial hemorrhage, mass, midline shift, or extra-axial fluid collection. There are small chronic infarcts in the left cerebellum. Cerebral white matter hypodensities are nonspecific but compatible with mild chronic small vessel ischemic disease. The ventricles and sulci are within normal limits for age. Vascular: Calcified atherosclerosis at the skull  base. No hyperdense vessel. Skull: No fracture or focal osseous lesion. Sinuses: Mild mucosal thickening in the right greater than left maxillary sinuses. Clear mastoid air cells. Orbits: Bilateral cataract extraction. Review of the MIP images confirms the above findings CTA NECK FINDINGS Aortic arch: Normal variant 4 vessel aortic arch with the left vertebral artery arising from the arch. Mild atherosclerosis without significant arch vessel origin stenosis. Right carotid system: Patent with mild atherosclerotic plaque most notable at the carotid bifurcation. No evidence of significant stenosis or dissection. Tortuous mid to distal cervical ICA. Left carotid system: Patent with mild atherosclerotic plaque most notable at the carotid bifurcation. No evidence of significant stenosis or dissection. Tortuous mid cervical ICA. Vertebral arteries: Patent with the right being slightly larger than the left. Mild calcified plaque at the right vertebral artery origin. No evidence of significant stenosis or dissection. Skeleton: Moderate diffuse cervical disc degeneration. Other neck: No evidence of cervical lymphadenopathy or mass. Upper chest: Mild motion artifact through the lung apices. No consolidation. Review of the MIP images confirms the above findings CTA HEAD FINDINGS Anterior circulation: The internal carotid arteries are patent from skull base to carotid termini with mild atherosclerotic plaque bilaterally not resulting in significant stenosis. ACAs and MCAs are patent with mild irregularity predominantly involving the branch vessels but no evidence of proximal branch occlusion or flow limiting proximal stenosis. There is a mild proximal left M1 stenosis. No  aneurysm is identified. Posterior circulation: The intracranial vertebral arteries are patent to the basilar with mild nonstenotic plaque bilaterally. Patent PICA and SCA origins are visualized bilaterally. The basilar artery is widely patent. There is a large  left posterior communicating artery with hypoplastic left P1 segment. The PCAs are patent with mild irregularity bilaterally but no significant proximal stenosis. No aneurysm is identified. Venous sinuses: Patent. Anatomic variants: Fetal left PCA. Review of the MIP images confirms the above findings IMPRESSION: 1. No evidence of acute intracranial abnormality. 2. 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 3. Mild atherosclerosis in the head and neck without large vessel occlusion or significant proximal stenosis. Electronically Signed   By: Sebastian Ache M.D.   On: 08/26/2019 13:05   Mr Brain Wo Contrast  Addendum Date: 08/26/2019   ADDENDUM REPORT: 08/26/2019 16:00 ADDENDUM: Study discussed by telephone with provider JAIME WARD on 08/26/2019 at 1552 hours. We discussed that the acute ischemia would result in left side symptoms. But that there is chronic ischemia in the left hemisphere. Electronically Signed   By: Odessa Fleming M.D.   On: 08/26/2019 16:00   Result Date: 08/26/2019 CLINICAL DATA:  85 year old male with right facial droop and abnormal speech. EXAM: MRI HEAD WITHOUT CONTRAST TECHNIQUE: Multiplanar, multiecho pulse sequences of the brain and surrounding structures were obtained without intravenous contrast. COMPARISON:  CTA head and neck earlier today. FINDINGS: Brain: There are several scattered small cortical areas of restricted diffusion along the superolateral right motor strip (series 5, images 89 and 92). Little subcortical white matter involvement. Faint associated FLAIR hyperintensity with questionable petechial hemorrhage (series 12, image 40) but no mass effect. There is also a punctate area of restricted diffusion in the right occipital pole on series 5, image 74. But no other diffusion abnormality in the brain. There are small chronic infarcts in the left cerebellum. No definite chronic blood products. No midline shift, mass effect, evidence of mass  lesion, ventriculomegaly, extra-axial collection or acute intracranial hemorrhage. Cervicomedullary junction and pituitary are within normal limits. Scattered small bilateral cerebral white matter T2 and FLAIR hyperintensity +more patchy bilateral periventricular involvement is nonspecific and mild to moderate for age. No chronic cortical encephalomalacia identified. Small chronic lacunar infarct of the left caudate. Small chronic lacunar infarct of the left pons. Vascular: Major intracranial vascular flow voids are preserved. Pneumatized right anterior clinoid process. Skull and upper cervical spine: Negative for age visible cervical spine. Normal bone marrow signal. Sinuses/Orbits: Postoperative changes to both globes, otherwise negative orbits. Mild paranasal sinus mucosal thickening. Other: Visible internal auditory structures appear normal. Mastoids are clear. Scalp and face soft tissues appear negative. IMPRESSION: 1. Positive for several small acute cortical infarcts in the posterior right MCA territory at the motor strip. Questionable petechial hemorrhage but no malignant hemorrhagic transformation or mass effect. 2. Superimposed punctate acute infarct in the right occipital pole. This might reflect synchronous small vessel disease rather than a recent embolic event. 3. Underlying chronic small vessel disease in the left cerebellum, left caudate, and left pons. Electronically Signed: By: Odessa Fleming M.D. On: 08/26/2019 15:45   Vas [place] Lower Extremity Venous (dvt)  Result Date: 08/28/2019  Lower Venous Study Indications: Stroke.  Comparison Study: no prior Performing Technologist: Jeb Levering RDMS, RVT  Examination Guidelines: A complete evaluation includes B-mode imaging, spectral Doppler, color Doppler, and power Doppler as needed of all accessible portions of each vessel. Bilateral testing is considered an integral part of a complete examination.  Limited examinations for reoccurring indications may be  performed as noted.  +---------+---------------+---------+-----------+----------+--------------+ RIGHT    CompressibilityPhasicitySpontaneityPropertiesThrombus Aging +---------+---------------+---------+-----------+----------+--------------+ CFV      Full           Yes      Yes                                 +---------+---------------+---------+-----------+----------+--------------+ SFJ      Full                                                        +---------+---------------+---------+-----------+----------+--------------+ FV Prox  Full                                                        +---------+---------------+---------+-----------+----------+--------------+ FV Mid   Full                                                        +---------+---------------+---------+-----------+----------+--------------+ FV DistalFull                                                        +---------+---------------+---------+-----------+----------+--------------+ PFV      Full                                                        +---------+---------------+---------+-----------+----------+--------------+ POP      Full           Yes      Yes                                 +---------+---------------+---------+-----------+----------+--------------+ PTV      Full                                                        +---------+---------------+---------+-----------+----------+--------------+ PERO     Full                                                        +---------+---------------+---------+-----------+----------+--------------+   +---------+---------------+---------+-----------+----------+--------------+ LEFT     CompressibilityPhasicitySpontaneityPropertiesThrombus Aging +---------+---------------+---------+-----------+----------+--------------+ CFV      Full           Yes      Yes                                  +---------+---------------+---------+-----------+----------+--------------+  SFJ      Full                                                        +---------+---------------+---------+-----------+----------+--------------+ FV Prox  Full                                                        +---------+---------------+---------+-----------+----------+--------------+ FV Mid   Full                                                        +---------+---------------+---------+-----------+----------+--------------+ FV DistalFull                                                        +---------+---------------+---------+-----------+----------+--------------+ PFV      Full                                                        +---------+---------------+---------+-----------+----------+--------------+ POP      Full           Yes      Yes                                 +---------+---------------+---------+-----------+----------+--------------+ PTV      Full                                                        +---------+---------------+---------+-----------+----------+--------------+ PERO     Full                                                        +---------+---------------+---------+-----------+----------+--------------+     Summary: Right: There is no evidence of deep vein thrombosis in the lower extremity. No cystic structure found in the popliteal fossa. Left: There is no evidence of deep vein thrombosis in the lower extremity. No cystic structure found in the popliteal fossa.  *See table(s) above for measurements and observations. Electronically signed by Monica Martinez MD on 08/28/2019 at 4:22:16 PM.    Final    Subjective: Resting well alert awake oriented, no focal weakness.  Waiting for loop recorder prior to discharge home today  Discharge Exam: Vitals:   08/28/19 1210 08/28/19 1534  BP: (!) 186/94 116/86  Pulse: 62 83  Resp:  20 16  Temp: 97.7  F (36.5 C) 97.7 F (36.5 C)  SpO2: 95% 94%   Vitals:   08/28/19 0423 08/28/19 0916 08/28/19 1210 08/28/19 1534  BP: (!) 170/78 (!) 150/78 (!) 186/94 116/86  Pulse: 60 70 62 83  Resp: 16 20 20 16   Temp: (!) 97.5 F (36.4 C) (!) 97.4 F (36.3 C) 97.7 F (36.5 C) 97.7 F (36.5 C)  TempSrc: Oral Oral Oral Oral  SpO2: 96% 97% 95% 94%  Weight:      Height:        General: Pt is alert, awake, not in acute distress Cardiovascular: RRR, S1/S2 +, no rubs, no gallops Respiratory: CTA bilaterally, no wheezing, no rhonchi Abdominal: Soft, NT, ND, bowel sounds + Extremities: no edema, no cyanosis   The results of significant diagnostics from this hospitalization (including imaging, microbiology, ancillary and laboratory) are listed below for reference.     Microbiology: Recent Results (from the past 240 hour(s))  SARS CORONAVIRUS 2 (TAT 6-24 HRS) Nasopharyngeal Nasopharyngeal Swab     Status: None   Collection Time: 08/26/19  2:45 PM   Specimen: Nasopharyngeal Swab  Result Value Ref Range Status   SARS Coronavirus 2 NEGATIVE NEGATIVE Final    Comment: (NOTE) SARS-CoV-2 target nucleic acids are NOT DETECTED. The SARS-CoV-2 RNA is generally detectable in upper and lower respiratory specimens during the acute phase of infection. Negative results do not preclude SARS-CoV-2 infection, do not rule out co-infections with other pathogens, and should not be used as the sole basis for treatment or other patient management decisions. Negative results must be combined with clinical observations, patient history, and epidemiological information. The expected result is Negative. Fact Sheet for Patients: HairSlick.no Fact Sheet for Healthcare Providers: quierodirigir.com This test is not yet approved or cleared by the [place] FDA and  has been authorized for detection and/or diagnosis of SARS-CoV-2 by FDA under an Emergency Use  Authorization (EUA). This EUA will remain  in effect (meaning this test can be used) for the duration of the COVID-19 declaration under Section 56 4(b)(1) of the Act, 21 U.S.C. section 360bbb-3(b)(1), unless the authorization is terminated or revoked sooner. Performed at [facility], [address] [place], [place] [address]      Labs: BNP (last 3 results) No results for input(s): BNP in the last 8760 hours. Basic Metabolic Panel: Recent Labs  Lab 08/26/19 1136 08/26/19 1141 08/28/19 0440  NA 141 142 141  K 4.1 4.0 3.8  CL 107 107 106  CO2 25  --  25  GLUCOSE 116* 112* 111*  BUN 14 16 18   CREATININE 1.01 0.90 1.09  CALCIUM 8.7*  --  8.7*   Liver Function Tests: Recent Labs  Lab 08/26/19 1136  AST 24  ALT 17  ALKPHOS 69  BILITOT 1.1  PROT 6.1*  ALBUMIN 3.7   No results for input(s): LIPASE, AMYLASE in the last 168 hours. No results for input(s): AMMONIA in the last 168 hours. CBC: Recent Labs  Lab 08/26/19 1136 08/26/19 1141 08/28/19 0440  WBC 6.0  --  6.7  NEUTROABS 3.4  --  3.7  HGB 14.2 13.9 14.2  HCT 40.9 41.0 40.3  MCV 92.5  --  91.0  PLT 193  --  202   Cardiac Enzymes: No results for input(s): CKTOTAL, CKMB, CKMBINDEX, TROPONINI in the last 168 hours. BNP: Invalid input(s): POCBNP CBG: Recent Labs  Lab 08/26/19 1126  GLUCAP 104*   D-Dimer No results for  input(s): DDIMER in the last 72 hours. Hgb A1c Recent Labs    08/27/19 0532  HGBA1C 5.8*   Lipid Profile Recent Labs    08/27/19 0532  CHOL 124  HDL 30*  LDLCALC 71  TRIG 409113  CHOLHDL 4.1   Thyroid function studies No results for input(s): TSH, T4TOTAL, T3FREE, THYROIDAB in the last 72 hours.  Invalid input(s): FREET3 Anemia work up No results for input(s): VITAMINB12, FOLATE, FERRITIN, TIBC, IRON, RETICCTPCT in the last 72 hours. Urinalysis    Component Value Date/Time   COLORURINE STRAW (A) 08/26/2019 1135   APPEARANCEUR CLEAR 08/26/2019 1135   LABSPEC 1.006  08/26/2019 1135   PHURINE 7.0 08/26/2019 1135   GLUCOSEU NEGATIVE 08/26/2019 1135   HGBUR NEGATIVE 08/26/2019 1135   BILIRUBINUR NEGATIVE 08/26/2019 1135   KETONESUR NEGATIVE 08/26/2019 1135   PROTEINUR NEGATIVE 08/26/2019 1135   NITRITE NEGATIVE 08/26/2019 1135   LEUKOCYTESUR NEGATIVE 08/26/2019 1135   Sepsis Labs Invalid input(s): PROCALCITONIN,  WBC,  LACTICIDVEN Microbiology Recent Results (from the past 240 hour(s))  SARS CORONAVIRUS 2 (TAT 6-24 HRS) Nasopharyngeal Nasopharyngeal Swab     Status: None   Collection Time: 08/26/19  2:45 PM   Specimen: Nasopharyngeal Swab  Result Value Ref Range Status   SARS Coronavirus 2 NEGATIVE NEGATIVE Final    Comment: (NOTE) SARS-CoV-2 target nucleic acids are NOT DETECTED. The SARS-CoV-2 RNA is generally detectable in upper and lower respiratory specimens during the acute phase of infection. Negative results do not preclude SARS-CoV-2 infection, do not rule out co-infections with other pathogens, and should not be used as the sole basis for treatment or other patient management decisions. Negative results must be combined with clinical observations, patient history, and epidemiological information. The expected result is Negative. Fact Sheet for Patients: HairSlick.nohttps://www.fda.gov/media/138098/download Fact Sheet for Healthcare Providers: quierodirigir.comhttps://www.fda.gov/media/138095/download This test is not yet approved or cleared by the [place] FDA and  has been authorized for detection and/or diagnosis of SARS-CoV-2 by FDA under an Emergency Use Authorization (EUA). This EUA will remain  in effect (meaning this test can be used) for the duration of the COVID-19 declaration under Section 56 4(b)(1) of the Act, 21 U.S.C. section 360bbb-3(b)(1), unless the authorization is terminated or revoked sooner. Performed at [facility], [address] [place], [place] [address]      Time coordinating discharge: 25  minutes  SIGNED:   Lanae Boastamesh Jadyn Brasher, MD  [facility] 08/28/2019, 5:05 PM  If 7PM-7AM, please contact night-coverage [email or web address]

## 2019-08-28 NOTE — Progress Notes (Signed)
Pt discharged home. Discharge summary went over with patient and all questions were answered. IV's removed and no bleeding noted. Pt has no complaint of pain. All belongings returned to patient.

## 2019-08-28 NOTE — Plan of Care (Signed)
Problem: Education: Goal: Knowledge of General Education information will improve Description: Including pain rating scale, medication(s)/side effects and non-pharmacologic comfort measures 08/28/2019 1633 by Pilar Plate, RN Outcome: Adequate for Discharge 08/28/2019 1633 by Pilar Plate, RN Outcome: Adequate for Discharge   Problem: Health Behavior/Discharge Planning: Goal: Ability to manage health-related needs will improve 08/28/2019 1633 by Pilar Plate, RN Outcome: Adequate for Discharge 08/28/2019 1633 by Pilar Plate, RN Outcome: Adequate for Discharge   Problem: Clinical Measurements: Goal: Ability to maintain clinical measurements within normal limits will improve 08/28/2019 1633 by Pilar Plate, RN Outcome: Adequate for Discharge 08/28/2019 1633 by Pilar Plate, RN Outcome: Adequate for Discharge Goal: Will remain free from infection 08/28/2019 1633 by Pilar Plate, RN Outcome: Adequate for Discharge 08/28/2019 1633 by Pilar Plate, RN Outcome: Adequate for Discharge Goal: Diagnostic test results will improve 08/28/2019 1633 by Pilar Plate, RN Outcome: Adequate for Discharge 08/28/2019 1633 by Pilar Plate, RN Outcome: Adequate for Discharge Goal: Respiratory complications will improve 08/28/2019 1633 by Pilar Plate, RN Outcome: Adequate for Discharge 08/28/2019 1633 by Pilar Plate, RN Outcome: Adequate for Discharge Goal: Cardiovascular complication will be avoided 08/28/2019 1633 by Pilar Plate, RN Outcome: Adequate for Discharge 08/28/2019 1633 by Pilar Plate, RN Outcome: Adequate for Discharge   Problem: Activity: Goal: Risk for activity intolerance will decrease 08/28/2019 1633 by Pilar Plate, RN Outcome: Adequate for Discharge 08/28/2019 1633 by Pilar Plate, RN Outcome: Adequate for Discharge   Problem: Nutrition: Goal: Adequate nutrition will be  maintained 08/28/2019 1633 by Pilar Plate, RN Outcome: Adequate for Discharge 08/28/2019 1633 by Pilar Plate, RN Outcome: Adequate for Discharge   Problem: Coping: Goal: Level of anxiety will decrease 08/28/2019 1633 by Pilar Plate, RN Outcome: Adequate for Discharge 08/28/2019 1633 by Pilar Plate, RN Outcome: Adequate for Discharge   Problem: Elimination: Goal: Will not experience complications related to bowel motility 08/28/2019 1633 by Pilar Plate, RN Outcome: Adequate for Discharge 08/28/2019 1633 by Pilar Plate, RN Outcome: Adequate for Discharge Goal: Will not experience complications related to urinary retention 08/28/2019 1633 by Pilar Plate, RN Outcome: Adequate for Discharge 08/28/2019 1633 by Pilar Plate, RN Outcome: Adequate for Discharge   Problem: Pain Managment: Goal: General experience of comfort will improve 08/28/2019 1633 by Pilar Plate, RN Outcome: Adequate for Discharge 08/28/2019 1633 by Pilar Plate, RN Outcome: Adequate for Discharge   Problem: Safety: Goal: Ability to remain free from injury will improve 08/28/2019 1633 by Pilar Plate, RN Outcome: Adequate for Discharge 08/28/2019 1633 by Pilar Plate, RN Outcome: Adequate for Discharge   Problem: Skin Integrity: Goal: Risk for impaired skin integrity will decrease 08/28/2019 1633 by Pilar Plate, RN Outcome: Adequate for Discharge 08/28/2019 1633 by Pilar Plate, RN Outcome: Adequate for Discharge   Problem: Education: Goal: Knowledge of disease or condition will improve 08/28/2019 1633 by Pilar Plate, RN Outcome: Adequate for Discharge 08/28/2019 1633 by Pilar Plate, RN Outcome: Adequate for Discharge Goal: Knowledge of secondary prevention will improve 08/28/2019 1633 by Pilar Plate, RN Outcome: Adequate for Discharge 08/28/2019 1633 by Pilar Plate, RN Outcome: Adequate for  Discharge Goal: Knowledge of patient specific risk factors addressed and post discharge goals established will improve 08/28/2019 1633 by Pilar Plate, RN Outcome: Adequate for Discharge 08/28/2019 1633 by Pilar Plate, RN Outcome: Adequate for Discharge Goal: Individualized Educational  Video(s) 08/28/2019 1633 by Janett Billow, RN Outcome: Adequate for Discharge 08/28/2019 1633 by Janett Billow, RN Outcome: Adequate for Discharge

## 2019-08-28 NOTE — Progress Notes (Signed)
Occupational Therapy Treatment Patient Details Name: Michael Kidd MRN: [id number] DOB: Mar 02, 1930 Today's Date: 08/28/2019    History of present illness 85 y.o. male with medical history significant of hypertension, dysthymia, coronary artery disease status post stents, hyperlipidemia, BPH, brought by EMS to emergency department for the concern of slurred speech and right facial droop.   OT comments  Pt requires min cues for simple path finding - no family present to determine if this is his baseline, or if new deficits.  Recommend HHOT to ensure safety in home environment   Follow Up Recommendations  Home health OT;Supervision - Intermittent    Equipment Recommendations  None recommended by OT    Recommendations for Other Services      Precautions / Restrictions Precautions Precautions: None       Mobility Bed Mobility Overal bed mobility: Modified Independent                Transfers Overall transfer level: Modified independent                    Balance Overall balance assessment: Mild deficits observed, not formally tested                                         ADL either performed or assessed with clinical judgement   ADL Overall ADL's : Modified independent                                             Vision   Vision Assessment?: Vision impaired- to be further tested in functional context   Perception     Praxis      Cognition Arousal/Alertness: Awake/alert Behavior During Therapy: WFL for tasks assessed/performed Overall Cognitive Status: History of cognitive impairments - at baseline                                 General Comments: Per SLP, wife reported pt is at baseline.  Pt able to follow 3 step command. He was able to use signage for simple path finding with min A.  He reports he doesn't typically do well with directions.  He reports he only drives places he is familiar with        Exercises     Shoulder Instructions       General Comments Pt is eager to return home     Pertinent Vitals/ Pain       Pain Assessment: No/denies pain  Home Living                                          Prior Functioning/Environment              Frequency  Min 2X/week        Progress Toward Goals  OT Goals(current goals can now be found in the care plan section)  Progress towards OT goals: Progressing toward goals  ADL Goals Additional ADL Goal #1: Pt will perform path finding task with no more than min questioning cues  Plan Discharge plan remains appropriate    Co-evaluation  AM-PAC OT "6 Clicks" Daily Activity     Outcome Measure   Help from another person eating meals?: None Help from another person taking care of personal grooming?: None Help from another person toileting, which includes using toliet, bedpan, or urinal?: None Help from another person bathing (including washing, rinsing, drying)?: None Help from another person to put on and taking off regular upper body clothing?: None Help from another person to put on and taking off regular lower body clothing?: None 6 Click Score: 24    End of Session    OT Visit Diagnosis: Cognitive communication deficit (R41.841) Symptoms and signs involving cognitive functions: Cerebral infarction   Activity Tolerance Patient tolerated treatment well   Patient Left in bed;with call bell/phone within reach;with bed alarm set   Nurse Communication Mobility status        Time: 1610-9604 OT Time Calculation (min): 15 min  Charges: OT General Charges $OT Visit: 1 Visit OT Treatments $Therapeutic Activity: 8-22 mins Lucille Passy, OTR/L [place] Pager [phone] Office [phone]    Lucille Passy M 08/28/2019, 5:17 PM

## 2019-08-29 ENCOUNTER — Encounter (HOSPITAL_COMMUNITY): Payer: Self-pay | Admitting: Internal Medicine

## 2019-09-11 NOTE — Progress Notes (Signed)
Cardiology Office Note Date:  09/12/2019  Patient ID:  Michael Kidd, DOB [birth year], MRN [id number] PCP:  Barron Alvine, MD  Cardiologist:  None Electrophysiologist: Dr. Ladona Ridgel    Chief Complaint: s/p loop implant, wound check visit  History of Present Illness: Michael Kidd is a 85 y.o. male with history of CAD (prior stents "many years ago, does not follow with any cardiologist), HLD, BPH, HTN, dythymia and most recently suffered a stroke, Smallrightmotor strip infarcts and MCA/PCA punctate infarct- embolic - source unknown,concerning for occult A. Fib. Underwent loop implant during his hospital stay  He is doing well, forgetful since his stroke.  No concerns with his implant site.  No pain. He denies CP, palpitations or SOB.  No cardiac awareness.  No syncope. He has CAD with remote stents but says he was doing well and did not think he needed to follow up any more, has been years.  He tells me he used to see Der. Krasowski in [place].  He asks if he needs to see the stroke doctor, that someone called and spoke to his wife about an appointment but he was not sure if he really needed to go.  Device information: MDT Linq II, implanted 08/28/2019, Dr. Ladona Ridgel, cryptogenic stroke   Past Medical History:  Diagnosis Date  . Arthritis   . Bilateral chronic knee pain   . BPH (benign prostatic hyperplasia)   . Dysthymia   . HTN (hypertension)   . Renal cyst     Past Surgical History:  Procedure Laterality Date  . LOOP RECORDER INSERTION N/A 08/28/2019   Procedure: LOOP RECORDER INSERTION;  Surgeon: Marinus Maw, MD;  [facility] INVASIVE CV LAB;  Service: Cardiovascular;  Laterality: N/A;  . PERCUTANEOUS CORONARY STENT INTERVENTION (PCI-S)      Current Outpatient Medications  Medication Sig Dispense Refill  . acetaminophen (TYLENOL) 650 MG CR tablet Take 650 mg by mouth every 8 (eight) hours as needed for pain.    Marland Kitchen aspirin EC 81 MG tablet Take 1 tablet (81 mg total) by  mouth daily for 21 days. 21 tablet 0  . atorvastatin (LIPITOR) 40 MG tablet Take 1 tablet (40 mg total) by mouth daily at 6 PM. 30 tablet 0  . clopidogrel (PLAVIX) 75 MG tablet Take 1 tablet (75 mg total) by mouth daily. 30 tablet 1  . finasteride (PROSCAR) 5 MG tablet Take 5 mg by mouth daily.    Marland Kitchen lisinopril (ZESTRIL) 5 MG tablet Take 5 mg by mouth daily.    . metoprolol tartrate (LOPRESSOR) 25 MG tablet Take 12.5 mg by mouth 2 (two) times daily.    Marland Kitchen omeprazole (PRILOSEC) 20 MG capsule Take 20 mg by mouth daily.    . tamsulosin (FLOMAX) 0.4 MG CAPS capsule Take 0.4 mg by mouth daily.     No current facility-administered medications for this visit.     Allergies:   Patient has no known allergies.   Social History:  The patient  reports that he has never smoked. He has never used smokeless tobacco. He reports previous alcohol use. He reports that he does not use drugs.   Family History:  The patient's family history includes Diabetes in his mother; Stroke in his father.  ROS:  Please see the history of present illness.   All other systems are reviewed and otherwise negative.   PHYSICAL EXAM:  VS:  BP (!) 136/94   Pulse (!) 54   Ht 5\' 10"  (1.778 m)   Wt 213  lb (96.6 kg)   BMI 30.56 kg/m  BMI: Body mass index is 30.56 kg/m. Well nourished, well developed, in no acute distress  HEENT: normocephalic, atraumatic  Neck: no JVD, carotid bruits or masses Cardiac:  RRR; no significant murmurs, no rubs, or gallops Lungs:  CTA b/l, no wheezing, rhonchi or rales  Abd: soft, nontender MS: no deformity or atrophy Ext: no edema  Skin: warm and dry, no rash Neuro:  No gross deficits appreciated Psych: euthymic mood, full affect   ILR site: dressing is removed without difficulty,  is well healed, no erythema, edema, fluctuation.  No increased heat to the surrounding tissues.  No evidence of infection   EKG:  Not done today ILR interrogation done today and reviewed by myself: SR, R waves  0.24, no AFib to date   Vas [place] Lower Extremity Venous (dvt) 08/27/2019 Summary:  Right: There is no evidence of deep vein thrombosis in the lower extremity. No cystic structure found in the popliteal fossa.  Left: There is no evidence of deep vein thrombosis in the lower extremity. No cystic structure found in the popliteal fossa.  Preliminary   Transthoracic Echocardiogram 08/27/2019 1. Left ventricular ejection fraction, by visual estimation, is 55 to 60%. The left ventricle has normal function. Normal left ventricular size. There is mildly increased left ventricular hypertrophy. 2. Left ventricular diastolic Doppler parameters are consistent with impaired relaxation pattern of LV diastolic filling. 3. Global right ventricle has normal systolic function.The right ventricular size is normal. No increase in right ventricular wall thickness. 4. Left atrial size was normal. 5. Right atrial size was normal. 6. The mitral valve is normal in structure. Mild mitral valve regurgitation. No evidence of mitral stenosis. 7. The tricuspid valve is normal in structure. Tricuspid valve regurgitation was not visualized by color flow Doppler. 8. The aortic valve is trileaflet with moderately thickened and calcified leaflets. Aortic valve regurgitation was not visualized by color flow Doppler. Moderate aortic valve stenosis. 9. The pulmonic valve was normal in structure. Pulmonic valve regurgitation is not visualized by color flow Doppler. 10. Mildly elevated pulmonary artery systolic pressure. 11. The inferior vena cava is normal in size with greater than 50% respiratory variability, suggesting right atrial pressure of 3 mmHg. 12. Aortic valve mean gradient measures 24.0 mmHg. 13. Aortic valve peak gradient measures 35.0 mmHg.   Recent Labs: 08/26/2019: ALT 17 08/28/2019: BUN 18; Creatinine, Ser 1.09; Hemoglobin 14.2; Platelets 202; Potassium 3.8; Sodium 141  08/27/2019: Cholesterol 124; HDL  30; LDL Cholesterol 71; Total CHOL/HDL Ratio 4.1; Triglycerides 113; VLDL 23   Estimated Creatinine Clearance: 56.7 mL/min (by C-G formula based on SCr of 1.09 mg/dL).   Wt Readings from Last 3 Encounters:  09/12/19 213 lb (96.6 kg)  08/27/19 210 lb (95.3 kg)     Other studies reviewed: Additional studies/records reviewed today include: summarized above  ASSESSMENT AND PLAN:  1. Cryptogenic stroke     ILR     Site is well healed     No AFib to daye  2. H/o CAD     No anginal symptoms     Lost to general cardiology follow up     Will have him get an appt with Dr. Agustin Cree to re-establish care  3. HTN     rechecked is 138/88     No changes today, will defer to general cardiology  4. HLD     looked good last month     On lipitor    Disposition: F/u  with neurology next 2 weeks, he is provided their phone number for he/his wife to make sure he has a follow up appointment, Dr. Bing Matter at a next available visit as discussed above, here PRN.  Monthly loop reports/transmissions.  Current medicines are reviewed at length with the patient today.  The patient did not have any concerns regarding medicines.  Norma Fredrickson, PA-C 09/12/2019 12:33 PM     [facility] [address] [place] [address] [phone] (office)  [phone] (fax)

## 2019-09-12 ENCOUNTER — Other Ambulatory Visit: Payer: Self-pay

## 2019-09-12 ENCOUNTER — Encounter: Payer: Self-pay | Admitting: Physician Assistant

## 2019-09-12 ENCOUNTER — Ambulatory Visit (INDEPENDENT_AMBULATORY_CARE_PROVIDER_SITE_OTHER): Payer: Medicare HMO | Admitting: Physician Assistant

## 2019-09-12 VITALS — BP 136/94 | HR 54 | Ht 70.0 in | Wt 213.0 lb

## 2019-09-12 DIAGNOSIS — I639 Cerebral infarction, unspecified: Secondary | ICD-10-CM

## 2019-09-12 DIAGNOSIS — Z4509 Encounter for adjustment and management of other cardiac device: Secondary | ICD-10-CM

## 2019-09-12 DIAGNOSIS — I2510 Atherosclerotic heart disease of native coronary artery without angina pectoris: Secondary | ICD-10-CM | POA: Diagnosis not present

## 2019-09-12 DIAGNOSIS — I10 Essential (primary) hypertension: Secondary | ICD-10-CM | POA: Diagnosis not present

## 2019-09-12 DIAGNOSIS — E785 Hyperlipidemia, unspecified: Secondary | ICD-10-CM | POA: Diagnosis not present

## 2019-09-12 DIAGNOSIS — Z5189 Encounter for other specified aftercare: Secondary | ICD-10-CM

## 2019-09-12 NOTE — Patient Instructions (Signed)
Medication Instructions:  Your physician recommends that you continue on your current medications as directed. Please refer to the Current Medication list given to you today.]  If you need a refill on your cardiac medications before your next appointment, please call your pharmacy.   Lab work: Patient did not provide current insurance card for today's visit.  Patient was informed that a current copy of their insurance card is expected at time of service and will need to bring for any future appointments.\  If you have labs (blood work) drawn today and your tests are completely normal, you will receive your results only by: Marland Kitchen MyChart Message (if you have MyChart) OR . A paper copy in the mail If you have any lab test that is abnormal or we need to change your treatment, we will call you to review the results.  Testing/Procedures: NONE ORDERED  TODAY    Follow-Up: with [facility]  IN [place]  NEXT ASAP OPENING   Any Other Special Instructions Will Be Listed Below (If Applicable).  [place]

## 2019-09-29 ENCOUNTER — Other Ambulatory Visit: Payer: Self-pay | Admitting: Internal Medicine

## 2019-09-29 ENCOUNTER — Ambulatory Visit (INDEPENDENT_AMBULATORY_CARE_PROVIDER_SITE_OTHER): Payer: Medicare HMO | Admitting: *Deleted

## 2019-09-29 DIAGNOSIS — I63412 Cerebral infarction due to embolism of left middle cerebral artery: Secondary | ICD-10-CM

## 2019-09-29 MED ORDER — ATORVASTATIN CALCIUM 40 MG PO TABS: 40.0000 mg | ORAL_TABLET | Freq: Every day | ORAL | 11 refills | Status: DC

## 2019-09-29 MED ORDER — CLOPIDOGREL BISULFATE 75 MG PO TABS: 75.0000 mg | ORAL_TABLET | Freq: Every day | ORAL | 11 refills | Status: DC

## 2019-09-29 NOTE — Telephone Encounter (Signed)
Pt's medications were sent to pt's pharmacy as requested. Confirmation received.  

## 2019-10-02 LAB — CUP PACEART REMOTE DEVICE CHECK
Date Time Interrogation Session: 20201025161045
Implantable Pulse Generator Implant Date: 20200921

## 2019-10-06 NOTE — Progress Notes (Signed)
Carelink Summary Report / Loop Recorder 

## 2019-10-12 DIAGNOSIS — Z8673 Personal history of transient ischemic attack (TIA), and cerebral infarction without residual deficits: Secondary | ICD-10-CM

## 2019-10-12 HISTORY — DX: Personal history of transient ischemic attack (TIA), and cerebral infarction without residual deficits: Z86.73

## 2019-10-19 ENCOUNTER — Ambulatory Visit (INDEPENDENT_AMBULATORY_CARE_PROVIDER_SITE_OTHER): Payer: Medicare HMO | Admitting: Cardiology

## 2019-10-19 ENCOUNTER — Other Ambulatory Visit: Payer: Self-pay

## 2019-10-19 ENCOUNTER — Encounter: Payer: Self-pay | Admitting: Cardiology

## 2019-10-19 VITALS — BP 120/80 | HR 68 | Ht 70.0 in | Wt 210.2 lb

## 2019-10-19 DIAGNOSIS — E782 Mixed hyperlipidemia: Secondary | ICD-10-CM

## 2019-10-19 DIAGNOSIS — I6930 Unspecified sequelae of cerebral infarction: Secondary | ICD-10-CM | POA: Insufficient documentation

## 2019-10-19 DIAGNOSIS — I10 Essential (primary) hypertension: Secondary | ICD-10-CM | POA: Diagnosis not present

## 2019-10-19 DIAGNOSIS — I69398 Other sequelae of cerebral infarction: Secondary | ICD-10-CM | POA: Insufficient documentation

## 2019-10-19 DIAGNOSIS — I2510 Atherosclerotic heart disease of native coronary artery without angina pectoris: Secondary | ICD-10-CM

## 2019-10-19 HISTORY — DX: Atherosclerotic heart disease of native coronary artery without angina pectoris: I25.10

## 2019-10-19 HISTORY — DX: Unspecified sequelae of cerebral infarction: I69.30

## 2019-10-19 NOTE — Progress Notes (Signed)
Cardiology Office Note:    Date:  10/19/2019   ID:  Michael Kidd, DOB 11-Jan-1930, MRN [id number]  PCP:  Barron Alvine, MD  Cardiologist:  Gypsy Balsam, MD    Referring MD: Barron Alvine, MD   Chief Complaint  Patient presents with  . Hospitalization Follow-up    History of Present Illness:    Michael Kidd is a 85 y.o. male that I was taking care of 3 years ago.  He was doing quite well.  He did have coronary artery disease with PTCA and stenting done many years ago.  Since he was doing well he did not think he needed any follow-up.  His additional past medical history includes essential hypertension dyslipidemia.  He presented to [facility] with acute stroke.  He did have some facial droop but otherwise he was quite well he is coming today to my office after he recovered from stroke he is doing well he recognize me with talks he jokes is that he can do what he wants to just a little bit weak and tired but no residual neurological problems.  He does have implantable loop recorder that being follow-up by EP team.  Overall he seems to be doing well denies have any cardiac complaints there is no chest pain no tightness no pressure no burning no squeezing in the chest no palpitations no dizziness no swelling of lower extremities  Past Medical History:  Diagnosis Date  . Arthritis   . Bilateral chronic knee pain   . BPH (benign prostatic hyperplasia)   . Dysthymia   . HTN (hypertension)   . Renal cyst     Past Surgical History:  Procedure Laterality Date  . LOOP RECORDER INSERTION N/A 08/28/2019   Procedure: LOOP RECORDER INSERTION;  Surgeon: Marinus Maw, MD;  Location: [facility] INVASIVE CV LAB;  Service: Cardiovascular;  Laterality: N/A;  . PERCUTANEOUS CORONARY STENT INTERVENTION (PCI-S)      Current Medications: Current Meds  Medication Sig  . acetaminophen (TYLENOL) 650 MG CR tablet Take 650 mg by mouth every 8 (eight) hours as needed for pain.  Marland Kitchen atorvastatin (LIPITOR) 40 MG  tablet Take 1 tablet (40 mg total) by mouth daily at 6 PM.  . clopidogrel (PLAVIX) 75 MG tablet Take 1 tablet (75 mg total) by mouth daily.  . finasteride (PROSCAR) 5 MG tablet Take 5 mg by mouth daily.  Marland Kitchen lisinopril (ZESTRIL) 5 MG tablet Take 5 mg by mouth daily.  . metoprolol tartrate (LOPRESSOR) 25 MG tablet Take 12.5 mg by mouth 2 (two) times daily.  Marland Kitchen omeprazole (PRILOSEC) 20 MG capsule Take 20 mg by mouth daily.  . tamsulosin (FLOMAX) 0.4 MG CAPS capsule Take 0.4 mg by mouth daily.     Allergies:   Patient has no known allergies.   Social History   Socioeconomic History  . Marital status: Married    Spouse name: Not on file  . Number of children: Not on file  . Years of education: Not on file  . Highest education level: Not on file  Occupational History  . Not on file  Social Needs  . Financial resource strain: Not on file  . Food insecurity    Worry: Not on file    Inability: Not on file  . Transportation needs    Medical: Not on file    Non-medical: Not on file  Tobacco Use  . Smoking status: Former Games developer  . Smokeless tobacco: Never Used  Substance and Sexual Activity  . Alcohol  use: Not Currently  . Drug use: Never  . Sexual activity: Not on file  Lifestyle  . Physical activity    Days per week: Not on file    Minutes per session: Not on file  . Stress: Not on file  Relationships  . Social Herbalist on phone: Not on file    Gets together: Not on file    Attends religious service: Not on file    Active member of club or organization: Not on file    Attends meetings of clubs or organizations: Not on file    Relationship status: Not on file  Other Topics Concern  . Not on file  Social History Narrative  . Not on file     Family History: The patient's family history includes Diabetes in his mother; Stroke in his father. ROS:   Please see the history of present illness.    All 14 point review of systems negative except as described per  history of present illness  EKGs/Labs/Other Studies Reviewed:      Recent Labs: 08/26/2019: ALT 17 08/28/2019: BUN 18; Creatinine, Ser 1.09; Hemoglobin 14.2; Platelets 202; Potassium 3.8; Sodium 141  Recent Lipid Panel    Component Value Date/Time   CHOL 124 08/27/2019 0532   TRIG 113 08/27/2019 0532   HDL 30 (L) 08/27/2019 0532   CHOLHDL 4.1 08/27/2019 0532   VLDL 23 08/27/2019 0532   LDLCALC 71 08/27/2019 0532    Physical Exam:    VS:  BP 120/80   Pulse 68   Ht 5\' 10"  (1.778 m)   Wt 210 lb 3.2 oz (95.3 kg)   SpO2 94%   BMI 30.16 kg/m     Wt Readings from Last 3 Encounters:  10/19/19 210 lb 3.2 oz (95.3 kg)  09/12/19 213 lb (96.6 kg)  08/27/19 210 lb (95.3 kg)     GEN:  Well nourished, well developed in no acute distress HEENT: Normal NECK: No JVD; No carotid bruits LYMPHATICS: No lymphadenopathy CARDIAC: RRR, systolic ejection murmur grade 2/6 to 3/6 best heard right upper portion of the sternum s, no rubs, no gallops RESPIRATORY:  Clear to auscultation without rales, wheezing or rhonchi  ABDOMEN: Soft, non-tender, non-distended MUSCULOSKELETAL:  No edema; No deformity  SKIN: Warm and dry LOWER EXTREMITIES: no swelling NEUROLOGIC:  Alert and oriented x 3 PSYCHIATRIC:  Normal affect   ASSESSMENT:    1. Essential hypertension   2. Coronary artery disease involving native coronary artery of native heart without angina pectoris   3. Late effect of cerebrovascular accident (CVA)   4. Mixed hyperlipidemia    PLAN:    In order of problems listed above:  1. Essential hypertension blood pressure well controlled 120/80 today.  We will continue present management. 2. Remote coronary artery disease without any recent symptoms lately.  Echocardiogram done which showed preserved left ventricle ejection fraction. 3. Aortic stenosis moderate based on echocardiogram done just few weeks ago at [facility].  We will continue monitoring he will require another echocardiogram  in about a year. 4. Late effect of CVA doing well from that point of view so far no clear-cut reason for his CVA being identified.  Risk factors has been modified, he does have implantable loop recorder that is following to check his see if he got an episode of atrial fibrillation. 5. Mixed dyslipidemia last fasting profile that I have is from September LDL 71 HDL 30.  We will continue present management which include  high intensity statin in form of Lipitor.   Medication Adjustments/Labs and Tests Ordered: Current medicines are reviewed at length with the patient today.  Concerns regarding medicines are outlined above.  No orders of the defined types were placed in this encounter.  Medication changes: No orders of the defined types were placed in this encounter.   Signed, Georgeanna Lea, MD, [facility] 10/19/2019 3:31 PM    [facility]

## 2019-10-19 NOTE — Patient Instructions (Signed)
Medication Instructions:  Your physician recommends that you continue on your current medications as directed. Please refer to the Current Medication list given to you today.  *If you need a refill on your cardiac medications before your next appointment, please call your pharmacy*  Lab Work: None.  If you have labs (blood work) drawn today and your tests are completely normal, you will receive your results only by: . MyChart Message (if you have MyChart) OR . A paper copy in the mail If you have any lab test that is abnormal or we need to change your treatment, we will call you to review the results.  Testing/Procedures: None.   Follow-Up: At [facility], you and your health needs are our priority.  As part of our continuing mission to provide you with exceptional heart care, we have created designated Provider Care Teams.  These Care Teams include your primary Cardiologist (physician) and Advanced Practice Providers (APPs -  Physician Assistants and Nurse Practitioners) who all work together to provide you with the care you need, when you need it.  Your next appointment:   4 month(s)  The format for your next appointment:   In Person  Provider:   Robert Krasowski, MD  Other Instructions   

## 2019-11-01 ENCOUNTER — Ambulatory Visit (INDEPENDENT_AMBULATORY_CARE_PROVIDER_SITE_OTHER): Payer: Medicare HMO | Admitting: *Deleted

## 2019-11-01 DIAGNOSIS — I63412 Cerebral infarction due to embolism of left middle cerebral artery: Secondary | ICD-10-CM | POA: Diagnosis not present

## 2019-11-03 LAB — CUP PACEART REMOTE DEVICE CHECK
Date Time Interrogation Session: 20201127121255
Implantable Pulse Generator Implant Date: 20200921

## 2019-12-04 ENCOUNTER — Ambulatory Visit (INDEPENDENT_AMBULATORY_CARE_PROVIDER_SITE_OTHER): Payer: Medicare HMO | Admitting: *Deleted

## 2019-12-04 DIAGNOSIS — I63412 Cerebral infarction due to embolism of left middle cerebral artery: Secondary | ICD-10-CM

## 2019-12-04 LAB — CUP PACEART REMOTE DEVICE CHECK
Date Time Interrogation Session: 20201228094029
Implantable Pulse Generator Implant Date: 20200921

## 2020-01-04 ENCOUNTER — Ambulatory Visit (INDEPENDENT_AMBULATORY_CARE_PROVIDER_SITE_OTHER): Payer: Medicare HMO | Admitting: *Deleted

## 2020-01-04 DIAGNOSIS — I63412 Cerebral infarction due to embolism of left middle cerebral artery: Secondary | ICD-10-CM

## 2020-01-04 LAB — CUP PACEART REMOTE DEVICE CHECK
Date Time Interrogation Session: 20210127230156
Implantable Pulse Generator Implant Date: 20200921

## 2020-01-05 NOTE — Progress Notes (Signed)
ILR Remote 

## 2020-01-19 ENCOUNTER — Other Ambulatory Visit: Payer: Self-pay

## 2020-01-19 ENCOUNTER — Encounter: Payer: Self-pay | Admitting: Cardiology

## 2020-01-19 ENCOUNTER — Ambulatory Visit (INDEPENDENT_AMBULATORY_CARE_PROVIDER_SITE_OTHER): Payer: Medicare HMO | Admitting: Cardiology

## 2020-01-19 VITALS — BP 126/84 | HR 71 | Temp 97.7°F | Ht 70.0 in | Wt 208.2 lb

## 2020-01-19 DIAGNOSIS — I10 Essential (primary) hypertension: Secondary | ICD-10-CM

## 2020-01-19 DIAGNOSIS — E782 Mixed hyperlipidemia: Secondary | ICD-10-CM

## 2020-01-19 DIAGNOSIS — I6930 Unspecified sequelae of cerebral infarction: Secondary | ICD-10-CM

## 2020-01-19 DIAGNOSIS — I2510 Atherosclerotic heart disease of native coronary artery without angina pectoris: Secondary | ICD-10-CM | POA: Diagnosis not present

## 2020-01-19 NOTE — Progress Notes (Signed)
Cardiology Office Note:    Date:  01/19/2020   ID:  Jerrad Mendibles, DOB 1930-05-25, MRN [id number]  PCP:  Jene Every, MD  Cardiologist:  Jenne Campus, MD    Referring MD: Jene Every, MD   No chief complaint on file.   History of Present Illness:    Michael Kidd is a 86 y.o. male with past medical history significant for coronary artery disease, PTCA and stenting done many years ago, also history of hypertension, dyslipidemia.  In November he presented to hospital with acute CVA.  Likely recovered quite nicely.  He also got implantable loop recorder inserted looking for potential atrial fibrillation.  So far no evidence of atrial fibrillation. He comes to my office for follow-up of all doing great.  He is asymptomatic.  Denies have any chest pain tightness squeezing pressure burning chest.  He tells me that recovery taking longer now than before but otherwise seems to be doing well.  He is taking care of his younger but sick wife and he is busy doing that.  Past Medical History:  Diagnosis Date  . Arthritis   . Bilateral chronic knee pain   . BPH (benign prostatic hyperplasia)   . Dysthymia   . HTN (hypertension)   . Renal cyst     Past Surgical History:  Procedure Laterality Date  . LOOP RECORDER INSERTION N/A 08/28/2019   Procedure: LOOP RECORDER INSERTION;  Surgeon: Evans Lance, MD;  Location: [place] CV LAB;  Service: Cardiovascular;  Laterality: N/A;  . PERCUTANEOUS CORONARY STENT INTERVENTION (PCI-S)      Current Medications: Current Meds  Medication Sig  . acetaminophen (TYLENOL) 650 MG CR tablet Take 650 mg by mouth every 8 (eight) hours as needed for pain.  Marland Kitchen atorvastatin (LIPITOR) 40 MG tablet Take 1 tablet (40 mg total) by mouth daily at 6 PM.  . clopidogrel (PLAVIX) 75 MG tablet Take 1 tablet (75 mg total) by mouth daily.  . finasteride (PROSCAR) 5 MG tablet Take 5 mg by mouth daily.  Marland Kitchen lisinopril (ZESTRIL) 5 MG tablet Take 5 mg by mouth daily.  .  metoprolol tartrate (LOPRESSOR) 25 MG tablet Take 12.5 mg by mouth 2 (two) times daily.  Marland Kitchen omeprazole (PRILOSEC) 20 MG capsule Take 20 mg by mouth daily.  . tamsulosin (FLOMAX) 0.4 MG CAPS capsule Take 0.4 mg by mouth daily.     Allergies:   Patient has no known allergies.   Social History   Socioeconomic History  . Marital status: Married    Spouse name: Not on file  . Number of children: Not on file  . Years of education: Not on file  . Highest education level: Not on file  Occupational History  . Not on file  Tobacco Use  . Smoking status: Former Research scientist (life sciences)  . Smokeless tobacco: Never Used  Substance and Sexual Activity  . Alcohol use: Not Currently  . Drug use: Never  . Sexual activity: Not on file  Other Topics Concern  . Not on file  Social History Narrative  . Not on file   Social Determinants of Health   Financial Resource Strain:   . Difficulty of Paying Living Expenses: Not on file  Food Insecurity:   . Worried About Charity fundraiser in the Last Year: Not on file  . Ran Out of Food in the Last Year: Not on file  Transportation Needs:   . Lack of Transportation (Medical): Not on file  . Lack of Transportation (Non-Medical):  Not on file  Physical Activity:   . Days of Exercise per Week: Not on file  . Minutes of Exercise per Session: Not on file  Stress:   . Feeling of Stress : Not on file  Social Connections:   . Frequency of Communication with Friends and Family: Not on file  . Frequency of Social Gatherings with Friends and Family: Not on file  . Attends Religious Services: Not on file  . Active Member of Clubs or Organizations: Not on file  . Attends Banker Meetings: Not on file  . Marital Status: Not on file     Family History: The patient's family history includes Diabetes in his mother; Stroke in his father. ROS:   Please see the history of present illness.    All 14 point review of systems negative except as described per history  of present illness  EKGs/Labs/Other Studies Reviewed:      Recent Labs: 08/26/2019: ALT 17 08/28/2019: BUN 18; Creatinine, Ser 1.09; Hemoglobin 14.2; Platelets 202; Potassium 3.8; Sodium 141  Recent Lipid Panel    Component Value Date/Time   CHOL 124 08/27/2019 0532   TRIG 113 08/27/2019 0532   HDL 30 (L) 08/27/2019 0532   CHOLHDL 4.1 08/27/2019 0532   VLDL 23 08/27/2019 0532   LDLCALC 71 08/27/2019 0532    Physical Exam:    VS:  BP 126/84   Pulse 71   Temp 97.7 F (36.5 C)   Ht 5\' 10"  (1.778 m)   Wt 208 lb 3.2 oz (94.4 kg)   SpO2 97%   BMI 29.87 kg/m     Wt Readings from Last 3 Encounters:  01/19/20 208 lb 3.2 oz (94.4 kg)  10/19/19 210 lb 3.2 oz (95.3 kg)  09/12/19 213 lb (96.6 kg)     GEN:  Well nourished, well developed in no acute distress HEENT: Normal NECK: No JVD; No carotid bruits LYMPHATICS: No lymphadenopathy CARDIAC: RRR, no murmurs, no rubs, no gallops RESPIRATORY:  Clear to auscultation without rales, wheezing or rhonchi  ABDOMEN: Soft, non-tender, non-distended MUSCULOSKELETAL:  No edema; No deformity  SKIN: Warm and dry LOWER EXTREMITIES: no swelling NEUROLOGIC:  Alert and oriented x 3 PSYCHIATRIC:  Normal affect   ASSESSMENT:    1. Coronary artery disease involving native coronary artery of native heart without angina pectoris   2. Essential hypertension   3. Late effect of cerebrovascular accident (CVA)   4. Mixed hyperlipidemia    PLAN:    In order of problems listed above:  1. Coronary disease stable without any issue.  Denies have any chest pain, tightness, pressure, burning in the chest.  He is trying to walk and exercise but he said arthritis bothers him a lot. 2. Essential hypertension blood pressure appears to be controlled we will continue present management. 3. Late effect of CVA stable does have implantable loop recorder I did review interrogation of the device there is no recorded arrhythmia. 4. Mixed dyslipidemia.  Last  fasting lipid profile showed LDL of 71 HDL 30.  That was done in September.  We will make arrangements for another fasting lipid profile.   Medication Adjustments/Labs and Tests Ordered: Current medicines are reviewed at length with the patient today.  Concerns regarding medicines are outlined above.  No orders of the defined types were placed in this encounter.  Medication changes: No orders of the defined types were placed in this encounter.   Signed, October, MD, [facility] 01/19/2020 3:35 PM  [facility]

## 2020-01-19 NOTE — Patient Instructions (Signed)
Medication Instructions:  No medication changes *If you need a refill on your cardiac medications before your next appointment, please call your pharmacy*  Lab Work: None ordered If you have labs (blood work) drawn today and your tests are completely normal, you will receive your results only by: . MyChart Message (if you have MyChart) OR . A paper copy in the mail If you have any lab test that is abnormal or we need to change your treatment, we will call you to review the results.  Testing/Procedures: None ordered  Follow-Up: At [facility], you and your health needs are our priority.  As part of our continuing mission to provide you with exceptional heart care, we have created designated Provider Care Teams.  These Care Teams include your primary Cardiologist (physician) and Advanced Practice Providers (APPs -  Physician Assistants and Nurse Practitioners) who all work together to provide you with the care you need, when you need it.  Your next appointment:   5 month(s)  The format for your next appointment:   In Person  Provider:   Robert Krasowski, MD  Other Instructions NA 

## 2020-02-05 ENCOUNTER — Ambulatory Visit (INDEPENDENT_AMBULATORY_CARE_PROVIDER_SITE_OTHER): Payer: Medicare HMO | Admitting: *Deleted

## 2020-02-05 DIAGNOSIS — I63412 Cerebral infarction due to embolism of left middle cerebral artery: Secondary | ICD-10-CM

## 2020-02-05 LAB — CUP PACEART REMOTE DEVICE CHECK
Date Time Interrogation Session: 20210228230457
Implantable Pulse Generator Implant Date: 20200921

## 2020-02-05 NOTE — Progress Notes (Signed)
ILR Remote 

## 2020-03-07 ENCOUNTER — Ambulatory Visit (INDEPENDENT_AMBULATORY_CARE_PROVIDER_SITE_OTHER): Payer: Medicare HMO | Admitting: *Deleted

## 2020-03-07 DIAGNOSIS — I63412 Cerebral infarction due to embolism of left middle cerebral artery: Secondary | ICD-10-CM

## 2020-03-07 LAB — CUP PACEART REMOTE DEVICE CHECK
Date Time Interrogation Session: 20210331230501
Implantable Pulse Generator Implant Date: 20200921

## 2020-03-07 NOTE — Progress Notes (Signed)
ILR Remote 

## 2020-04-07 LAB — CUP PACEART REMOTE DEVICE CHECK
Date Time Interrogation Session: 20210501230614
Implantable Pulse Generator Implant Date: 20200921

## 2020-04-08 ENCOUNTER — Ambulatory Visit (INDEPENDENT_AMBULATORY_CARE_PROVIDER_SITE_OTHER): Payer: Medicare HMO | Admitting: *Deleted

## 2020-04-08 DIAGNOSIS — I63412 Cerebral infarction due to embolism of left middle cerebral artery: Secondary | ICD-10-CM

## 2020-04-08 NOTE — Progress Notes (Signed)
Carelink Summary Report / Loop Recorder 

## 2020-05-07 ENCOUNTER — Ambulatory Visit (INDEPENDENT_AMBULATORY_CARE_PROVIDER_SITE_OTHER): Payer: Medicare HMO | Admitting: *Deleted

## 2020-05-07 DIAGNOSIS — I63412 Cerebral infarction due to embolism of left middle cerebral artery: Secondary | ICD-10-CM

## 2020-05-08 LAB — CUP PACEART REMOTE DEVICE CHECK
Date Time Interrogation Session: 20210601230145
Implantable Pulse Generator Implant Date: 20200921

## 2020-05-08 NOTE — Progress Notes (Signed)
Carelink Summary Report / Loop Recorder 

## 2020-05-23 DIAGNOSIS — I350 Nonrheumatic aortic (valve) stenosis: Secondary | ICD-10-CM

## 2020-05-23 DIAGNOSIS — R011 Cardiac murmur, unspecified: Secondary | ICD-10-CM

## 2020-05-23 HISTORY — DX: Nonrheumatic aortic (valve) stenosis: I35.0

## 2020-05-23 HISTORY — DX: Cardiac murmur, unspecified: R01.1

## 2020-06-07 ENCOUNTER — Ambulatory Visit (INDEPENDENT_AMBULATORY_CARE_PROVIDER_SITE_OTHER): Payer: Medicare HMO | Admitting: *Deleted

## 2020-06-07 DIAGNOSIS — I63412 Cerebral infarction due to embolism of left middle cerebral artery: Secondary | ICD-10-CM

## 2020-06-07 LAB — CUP PACEART REMOTE DEVICE CHECK
Date Time Interrogation Session: 20210701230520
Implantable Pulse Generator Implant Date: 20200921

## 2020-06-11 NOTE — Progress Notes (Signed)
Carelink Summary Report / Loop Recorder 

## 2020-06-13 ENCOUNTER — Ambulatory Visit: Payer: Medicare HMO | Admitting: Cardiology

## 2020-07-08 ENCOUNTER — Ambulatory Visit (INDEPENDENT_AMBULATORY_CARE_PROVIDER_SITE_OTHER): Payer: Medicare HMO | Admitting: *Deleted

## 2020-07-08 DIAGNOSIS — I63412 Cerebral infarction due to embolism of left middle cerebral artery: Secondary | ICD-10-CM

## 2020-07-09 LAB — CUP PACEART REMOTE DEVICE CHECK
Date Time Interrogation Session: 20210801230018
Implantable Pulse Generator Implant Date: 20200921

## 2020-07-10 NOTE — Progress Notes (Signed)
Carelink Summary Report / Loop Recorder 

## 2020-07-16 ENCOUNTER — Telehealth: Payer: Self-pay

## 2020-07-16 ENCOUNTER — Other Ambulatory Visit: Payer: Self-pay

## 2020-07-16 ENCOUNTER — Encounter: Payer: Self-pay | Admitting: Cardiology

## 2020-07-16 ENCOUNTER — Ambulatory Visit (INDEPENDENT_AMBULATORY_CARE_PROVIDER_SITE_OTHER): Payer: Medicare HMO | Admitting: Cardiology

## 2020-07-16 VITALS — BP 122/68 | HR 66 | Ht 71.0 in | Wt 210.2 lb

## 2020-07-16 DIAGNOSIS — E782 Mixed hyperlipidemia: Secondary | ICD-10-CM

## 2020-07-16 DIAGNOSIS — I10 Essential (primary) hypertension: Secondary | ICD-10-CM

## 2020-07-16 DIAGNOSIS — I6930 Unspecified sequelae of cerebral infarction: Secondary | ICD-10-CM

## 2020-07-16 DIAGNOSIS — I2510 Atherosclerotic heart disease of native coronary artery without angina pectoris: Secondary | ICD-10-CM

## 2020-07-16 NOTE — Telephone Encounter (Signed)
Pt spouse (DPR on file)  Requested when ILR will be removed.  Explained battery life is 3.5-4 years.  It is recommended that the device remain in place as long as possible if no evidence has been found to explain stroke.

## 2020-07-16 NOTE — Patient Instructions (Signed)
Medication Instructions:  °Your physician recommends that you continue on your current medications as directed. Please refer to the Current Medication list given to you today. ° °*If you need a refill on your cardiac medications before your next appointment, please call your pharmacy* ° ° °Lab Work: °None. ° °If you have labs (blood work) drawn today and your tests are completely normal, you will receive your results only by: °• MyChart Message (if you have MyChart) OR °• A paper copy in the mail °If you have any lab test that is abnormal or we need to change your treatment, we will call you to review the results. ° ° °Testing/Procedures: °Your physician has requested that you have an echocardiogram. Echocardiography is a painless test that uses sound waves to create images of your heart. It provides your doctor with information about the size and shape of your heart and how well your heart’s chambers and valves are working. This procedure takes approximately one hour. There are no restrictions for this procedure. ° ° ° ° °Follow-Up: °At [facility], you and your health needs are our priority.  As part of our continuing mission to provide you with exceptional heart care, we have created designated Provider Care Teams.  These Care Teams include your primary Cardiologist (physician) and Advanced Practice Providers (APPs -  Physician Assistants and Nurse Practitioners) who all work together to provide you with the care you need, when you need it. ° °We recommend signing up for the patient portal called "MyChart".  Sign up information is provided on this After Visit Summary.  MyChart is used to connect with patients for Virtual Visits (Telemedicine).  Patients are able to view lab/test results, encounter notes, upcoming appointments, etc.  Non-urgent messages can be sent to your provider as well.   °To learn more about what you can do with MyChart, go to [email or web address].   ° °Your next appointment:   °6  month(s) ° °The format for your next appointment:   °In Person ° °Provider:   °Robert Krasowski, MD ° ° °Other Instructions ° ° °Echocardiogram °An echocardiogram is a procedure that uses painless sound waves (ultrasound) to produce an image of the heart. Images from an echocardiogram can provide important information about: °· Signs of coronary artery disease (CAD). °· Aneurysm detection. An aneurysm is a weak or damaged part of an artery wall that bulges out from the normal force of blood pumping through the body. °· Heart size and shape. Changes in the size or shape of the heart can be associated with certain conditions, including heart failure, aneurysm, and CAD. °· Heart muscle function. °· Heart valve function. °· Signs of a past heart attack. °· Fluid buildup around the heart. °· Thickening of the heart muscle. °· A tumor or infectious growth around the heart valves. °Tell a health care provider about: °· Any allergies you have. °· All medicines you are taking, including vitamins, herbs, eye drops, creams, and over-the-counter medicines. °· Any blood disorders you have. °· Any surgeries you have had. °· Any medical conditions you have. °· Whether you are pregnant or may be pregnant. °What are the risks? °Generally, this is a safe procedure. However, problems may occur, including: °· Allergic reaction to dye (contrast) that may be used during the procedure. °What happens before the procedure? °No specific preparation is needed. You may eat and drink normally. °What happens during the procedure? ° °· An IV tube may be inserted into one of your veins. °· You may   receive contrast through this tube. A contrast is an injection that improves the quality of the pictures from your heart. °· A gel will be applied to your chest. °· A wand-like tool (transducer) will be moved over your chest. The gel will help to transmit the sound waves from the transducer. °· The sound waves will harmlessly bounce off of your heart to  allow the heart images to be captured in real-time motion. The images will be recorded on a computer. °The procedure may vary among health care providers and hospitals. °What happens after the procedure? °· You may return to your normal, everyday life, including diet, activities, and medicines, unless your health care provider tells you not to do that. °Summary °· An echocardiogram is a procedure that uses painless sound waves (ultrasound) to produce an image of the heart. °· Images from an echocardiogram can provide important information about the size and shape of your heart, heart muscle function, heart valve function, and fluid buildup around your heart. °· You do not need to do anything to prepare before this procedure. You may eat and drink normally. °· After the echocardiogram is completed, you may return to your normal, everyday life, unless your health care provider tells you not to do that. °This information is not intended to replace advice given to you by your health care provider. Make sure you discuss any questions you have with your health care provider. °Document Revised: 03/16/2019 Document Reviewed: 12/26/2016 °Elsevier Patient Education © 2020 [employer] ° ° °

## 2020-07-16 NOTE — Progress Notes (Signed)
Cardiology Office Note:    Date:  07/16/2020   ID:  Michael Kidd, DOB 19-Aug-1930, MRN [id number]  PCP:  Barron Alvine, MD  Cardiologist:  Gypsy Balsam, MD    Referring MD: Barron Alvine, MD   No chief complaint on file. Doing well  History of Present Illness:    Michael Kidd is a 86 y.o. male with past medical history significant for coronary artery disease PTCA and stenting done many years ago, hypertension, dyslipidemia.  In November he presented hospital with CVA, likely recovered nicely.  He does have implantable loop recorder looking for any evidence of atrial fibrillation so far it is unrevealing.  He comes today 2 months for follow-up overall seems to be doing well.  Described to have some exertional shortness of breath but no chest pain no palpitation no tightness squeezing pressure burning chest.  Past Medical History:  Diagnosis Date  . Arthritis   . Bilateral chronic knee pain   . BPH (benign prostatic hyperplasia)   . Dysthymia   . HTN (hypertension)   . Renal cyst     Past Surgical History:  Procedure Laterality Date  . LOOP RECORDER INSERTION N/A 08/28/2019   Procedure: LOOP RECORDER INSERTION;  Surgeon: Marinus Maw, MD;  Location: [facility] INVASIVE CV LAB;  Service: Cardiovascular;  Laterality: N/A;  . PERCUTANEOUS CORONARY STENT INTERVENTION (PCI-S)      Current Medications: Current Meds  Medication Sig  . acetaminophen (TYLENOL) 650 MG CR tablet Take 650 mg by mouth every 8 (eight) hours as needed for pain.  Marland Kitchen albuterol (VENTOLIN HFA) 108 (90 Base) MCG/ACT inhaler Inhale 2 puffs into the lungs every 6 (six) hours as needed.  Marland Kitchen atorvastatin (LIPITOR) 40 MG tablet Take 1 tablet (40 mg total) by mouth daily at 6 PM.  . clopidogrel (PLAVIX) 75 MG tablet Take 1 tablet (75 mg total) by mouth daily.  . finasteride (PROSCAR) 5 MG tablet Take 5 mg by mouth daily.  Marland Kitchen lisinopril (ZESTRIL) 5 MG tablet Take 5 mg by mouth daily.  . metoprolol tartrate (LOPRESSOR) 25 MG  tablet Take 12.5 mg by mouth 2 (two) times daily.  Marland Kitchen omeprazole (PRILOSEC) 20 MG capsule Take 20 mg by mouth daily.  . tamsulosin (FLOMAX) 0.4 MG CAPS capsule Take 0.4 mg by mouth daily.     Allergies:   Patient has no known allergies.   Social History   Socioeconomic History  . Marital status: Married    Spouse name: Not on file  . Number of children: Not on file  . Years of education: Not on file  . Highest education level: Not on file  Occupational History  . Not on file  Tobacco Use  . Smoking status: Former Games developer  . Smokeless tobacco: Never Used  Vaping Use  . Vaping Use: Never used  Substance and Sexual Activity  . Alcohol use: Not Currently  . Drug use: Never  . Sexual activity: Not on file  Other Topics Concern  . Not on file  Social History Narrative  . Not on file   Social Determinants of Health   Financial Resource Strain:   . Difficulty of Paying Living Expenses:   Food Insecurity:   . Worried About Programme researcher, broadcasting/film/video in the Last Year:   . Barista in the Last Year:   Transportation Needs:   . Freight forwarder (Medical):   Marland Kitchen Lack of Transportation (Non-Medical):   Physical Activity:   . Days of Exercise per  Week:   . Minutes of Exercise per Session:   Stress:   . Feeling of Stress :   Social Connections:   . Frequency of Communication with Friends and Family:   . Frequency of Social Gatherings with Friends and Family:   . Attends Religious Services:   . Active Member of Clubs or Organizations:   . Attends Banker Meetings:   Marland Kitchen Marital Status:      Family History: The patient's family history includes Diabetes in his mother; Stroke in his father. ROS:   Please see the history of present illness.    All 14 point review of systems negative except as described per history of present illness  EKGs/Labs/Other Studies Reviewed:      Recent Labs: 08/26/2019: ALT 17 08/28/2019: BUN 18; Creatinine, Ser 1.09; Hemoglobin  14.2; Platelets 202; Potassium 3.8; Sodium 141  Recent Lipid Panel    Component Value Date/Time   CHOL 124 08/27/2019 0532   TRIG 113 08/27/2019 0532   HDL 30 (L) 08/27/2019 0532   CHOLHDL 4.1 08/27/2019 0532   VLDL 23 08/27/2019 0532   LDLCALC 71 08/27/2019 0532    Physical Exam:    VS:  BP 122/68   Pulse 66   Ht 5\' 11"  (1.803 m)   Wt 210 lb 3.2 oz (95.3 kg)   SpO2 95%   BMI 29.32 kg/m     Wt Readings from Last 3 Encounters:  07/16/20 210 lb 3.2 oz (95.3 kg)  01/19/20 208 lb 3.2 oz (94.4 kg)  10/19/19 210 lb 3.2 oz (95.3 kg)     GEN:  Well nourished, well developed in no acute distress HEENT: Normal NECK: No JVD; No carotid bruits LYMPHATICS: No lymphadenopathy CARDIAC: RRR, systolic ejection murmur grade 1/6 best heard right upper portion of the sternum, there is also systolic murmur grade 2/6 best heard at apex, no rubs, no gallops RESPIRATORY:  Clear to auscultation without rales, wheezing or rhonchi  ABDOMEN: Soft, non-tender, non-distended MUSCULOSKELETAL:  No edema; No deformity  SKIN: Warm and dry LOWER EXTREMITIES: no swelling NEUROLOGIC:  Alert and oriented x 3 PSYCHIATRIC:  Normal affect   ASSESSMENT:    1. Coronary artery disease involving native coronary artery of native heart without angina pectoris   2. Essential hypertension   3. Late effect of cerebrovascular accident (CVA)   4. Mixed hyperlipidemia    PLAN:    In order of problems listed above:  1. Coronary disease stable from that point review we will continue present management. 2. Essential hypertension blood pressure well controlled 122/68 today we will continue present management. 3. Late effect of CVA.  So far event recorder did not show any significant arrhythmia, will continue present management continue monitoring. 4. Mixed dyslipidemia I did review his K PN LDL 71 HDL 30 we will continue present management which include high intensity statin in form of Lipitor 40.   Medication  Adjustments/Labs and Tests Ordered: Current medicines are reviewed at length with the patient today.  Concerns regarding medicines are outlined above.  Orders Placed This Encounter  Procedures  . ECHOCARDIOGRAM COMPLETE   Medication changes: No orders of the defined types were placed in this encounter.   Signed, 13/12/20, MD, [facility] 07/16/2020 2:01 PM    [facility]

## 2020-08-05 ENCOUNTER — Ambulatory Visit (INDEPENDENT_AMBULATORY_CARE_PROVIDER_SITE_OTHER): Payer: Medicare HMO

## 2020-08-05 ENCOUNTER — Other Ambulatory Visit: Payer: Self-pay

## 2020-08-05 DIAGNOSIS — I2510 Atherosclerotic heart disease of native coronary artery without angina pectoris: Secondary | ICD-10-CM | POA: Diagnosis not present

## 2020-08-05 LAB — ECHOCARDIOGRAM COMPLETE
AR max vel: 0.66 cm2
AV Area VTI: 0.67 cm2
AV Area mean vel: 0.56 cm2
AV Mean grad: 37 mmHg
AV Peak grad: 55.7 mmHg
Ao pk vel: 3.73 m/s
Area-P 1/2: 2.09 cm2
S' Lateral: 3.6 cm

## 2020-08-05 NOTE — Progress Notes (Signed)
Complete echocardiogram performed.  Jimmy Shanina Kepple RDCS, RVT  

## 2020-08-07 ENCOUNTER — Telehealth: Payer: Self-pay | Admitting: Cardiology

## 2020-08-07 NOTE — Telephone Encounter (Signed)
Patient's wife is requesting to discuss results from echocardiogram completed on 08/05/20. Please call.

## 2020-08-08 ENCOUNTER — Ambulatory Visit (INDEPENDENT_AMBULATORY_CARE_PROVIDER_SITE_OTHER): Payer: Medicare HMO | Admitting: *Deleted

## 2020-08-08 DIAGNOSIS — I63412 Cerebral infarction due to embolism of left middle cerebral artery: Secondary | ICD-10-CM | POA: Diagnosis not present

## 2020-08-08 LAB — CUP PACEART REMOTE DEVICE CHECK
Date Time Interrogation Session: 20210901230531
Implantable Pulse Generator Implant Date: 20200921

## 2020-08-08 NOTE — Telephone Encounter (Signed)
Follow up:    Patient following up on her request. Please call patient.

## 2020-08-08 NOTE — Telephone Encounter (Signed)
He is to have follow-up appointment to discuss finding on echocardiogram.  This is now for conversation over the phone.

## 2020-08-08 NOTE — Telephone Encounter (Signed)
Called and informed patient wife per dpr that patient needs appointment to discuss results. Double booked patient next week per Dr. Bing Matter request.

## 2020-08-09 NOTE — Progress Notes (Signed)
Carelink Summary Report / Loop Recorder 

## 2020-08-15 ENCOUNTER — Ambulatory Visit (INDEPENDENT_AMBULATORY_CARE_PROVIDER_SITE_OTHER): Payer: Medicare HMO | Admitting: Cardiology

## 2020-08-15 ENCOUNTER — Other Ambulatory Visit: Payer: Self-pay

## 2020-08-15 VITALS — BP 140/68 | HR 64 | Ht 71.0 in | Wt 211.4 lb

## 2020-08-15 DIAGNOSIS — I10 Essential (primary) hypertension: Secondary | ICD-10-CM

## 2020-08-15 DIAGNOSIS — R0600 Dyspnea, unspecified: Secondary | ICD-10-CM

## 2020-08-15 DIAGNOSIS — E782 Mixed hyperlipidemia: Secondary | ICD-10-CM

## 2020-08-15 DIAGNOSIS — I350 Nonrheumatic aortic (valve) stenosis: Secondary | ICD-10-CM

## 2020-08-15 DIAGNOSIS — R0609 Other forms of dyspnea: Secondary | ICD-10-CM

## 2020-08-15 DIAGNOSIS — I2510 Atherosclerotic heart disease of native coronary artery without angina pectoris: Secondary | ICD-10-CM

## 2020-08-15 HISTORY — DX: Dyspnea, unspecified: R06.00

## 2020-08-15 HISTORY — DX: Nonrheumatic aortic (valve) stenosis: I35.0

## 2020-08-15 HISTORY — DX: Other forms of dyspnea: R06.09

## 2020-08-15 NOTE — Progress Notes (Signed)
Cardiology Office Note:    Date:  08/15/2020   ID:  Michael Kidd, DOB 10/17/1930, MRN [id number]  PCP:  Michael Alvine, MD  Cardiologist:  Michael Balsam, MD    Referring MD: Michael Alvine, MD   No chief complaint on file. I am weak tired and exhausted also have shortness of breath  History of Present Illness:    Michael Kidd is a 86 y.o. male with past medical history significant for coronary artery disease, PTCA and stenting done many years ago, essential hypertension, dyslipidemia, last November he presented to the hospital with CVA, likely recovered quite nicely.  He does have implantable loop recorder look for potential atrial fibrillation as a reason for his CVA.  He came to me about a month ago complaining of being tired exhausted as well as shortness of breath.  He complained that he cannot do anything.  I ended up ordering another recording which showed what appears to be significant aortic stenosis and he is here to talk about that.  Still complain of having fatigue tiredness as well as shortness of breath while walking.  Denies having dizziness or passing out.  Past Medical History:  Diagnosis Date  . Arthritis   . Bilateral chronic knee pain   . BPH (benign prostatic hyperplasia)   . Dysthymia   . HTN (hypertension)   . Renal cyst     Past Surgical History:  Procedure Laterality Date  . LOOP RECORDER INSERTION N/A 08/28/2019   Procedure: LOOP RECORDER INSERTION;  Surgeon: Marinus Maw, MD;  Location: [facility] INVASIVE CV LAB;  Service: Cardiovascular;  Laterality: N/A;  . PERCUTANEOUS CORONARY STENT INTERVENTION (PCI-S)      Current Medications: Current Meds  Medication Sig  . acetaminophen (TYLENOL) 650 MG CR tablet Take 650 mg by mouth every 8 (eight) hours as needed for pain.  Marland Kitchen albuterol (VENTOLIN HFA) 108 (90 Base) MCG/ACT inhaler Inhale 2 puffs into the lungs every 6 (six) hours as needed.  Marland Kitchen atorvastatin (LIPITOR) 40 MG tablet Take 1 tablet (40 mg total) by mouth  daily at 6 PM.  . clopidogrel (PLAVIX) 75 MG tablet Take 1 tablet (75 mg total) by mouth daily.  . finasteride (PROSCAR) 5 MG tablet Take 5 mg by mouth daily.  Marland Kitchen lisinopril (ZESTRIL) 5 MG tablet Take 5 mg by mouth daily.  . metoprolol tartrate (LOPRESSOR) 25 MG tablet Take 12.5 mg by mouth 2 (two) times daily.  Marland Kitchen omeprazole (PRILOSEC) 20 MG capsule Take 20 mg by mouth daily.  . tamsulosin (FLOMAX) 0.4 MG CAPS capsule Take 0.4 mg by mouth daily.     Allergies:   Patient has no known allergies.   Social History   Socioeconomic History  . Marital status: Married    Spouse name: Not on file  . Number of children: Not on file  . Years of education: Not on file  . Highest education level: Not on file  Occupational History  . Not on file  Tobacco Use  . Smoking status: Former Games developer  . Smokeless tobacco: Never Used  Vaping Use  . Vaping Use: Never used  Substance and Sexual Activity  . Alcohol use: Not Currently  . Drug use: Never  . Sexual activity: Not on file  Other Topics Concern  . Not on file  Social History Narrative  . Not on file   Social Determinants of Health   Financial Resource Strain:   . Difficulty of Paying Living Expenses: Not on file  Food Insecurity:   .  Worried About Programme researcher, broadcasting/film/video in the Last Year: Not on file  . Ran Out of Food in the Last Year: Not on file  Transportation Needs:   . Lack of Transportation (Medical): Not on file  . Lack of Transportation (Non-Medical): Not on file  Physical Activity:   . Days of Exercise per Week: Not on file  . Minutes of Exercise per Session: Not on file  Stress:   . Feeling of Stress : Not on file  Social Connections:   . Frequency of Communication with Friends and Family: Not on file  . Frequency of Social Gatherings with Friends and Family: Not on file  . Attends Religious Services: Not on file  . Active Member of Clubs or Organizations: Not on file  . Attends Banker Meetings: Not on file   . Marital Status: Not on file     Family History: The patient's family history includes Diabetes in his mother; Stroke in his father. ROS:   Please see the history of present illness.    All 14 point review of systems negative except as described per history of present illness  EKGs/Labs/Other Studies Reviewed:      Recent Labs: 08/26/2019: ALT 17 08/28/2019: BUN 18; Creatinine, Ser 1.09; Hemoglobin 14.2; Platelets 202; Potassium 3.8; Sodium 141  Recent Lipid Panel    Component Value Date/Time   CHOL 124 08/27/2019 0532   TRIG 113 08/27/2019 0532   HDL 30 (L) 08/27/2019 0532   CHOLHDL 4.1 08/27/2019 0532   VLDL 23 08/27/2019 0532   LDLCALC 71 08/27/2019 0532    Physical Exam:    VS:  BP 140/68   Pulse 64   Ht 5\' 11"  (1.803 m)   Wt 211 lb 6.4 oz (95.9 kg)   SpO2 96%   BMI 29.48 kg/m     Wt Readings from Last 3 Encounters:  08/15/20 211 lb 6.4 oz (95.9 kg)  07/16/20 210 lb 3.2 oz (95.3 kg)  01/19/20 208 lb 3.2 oz (94.4 kg)     GEN:  Well nourished, well developed in no acute distress HEENT: Normal NECK: No JVD; No carotid bruits LYMPHATICS: No lymphadenopathy CARDIAC: RRR, systolic ejection murmur grade 2/6 to 3/6 best heard right upper portion of the sternum, no rubs, no gallops RESPIRATORY:  Clear to auscultation without rales, wheezing or rhonchi  ABDOMEN: Soft, non-tender, non-distended MUSCULOSKELETAL:  No edema; No deformity  SKIN: Warm and dry LOWER EXTREMITIES: no swelling NEUROLOGIC:  Alert and oriented x 3 PSYCHIATRIC:  Normal affect   ASSESSMENT:    1. Aortic valve stenosis, etiology of cardiac valve disease unspecified   2. Coronary artery disease involving native coronary artery of native heart without angina pectoris   3. Nonrheumatic aortic valve stenosis   4. Essential hypertension   5. Mixed hyperlipidemia   6. Dyspnea on exertion    PLAN:    In order of problems listed above:  Aortic stenosis which appears to be hemodynamically  significant is stage D 1, his symptoms include shortness of breath fatigue and tiredness.  He complained that he cannot do anything, parameters of his aortic valve are calculated aortic valve area 0.67 cm, mean creatinine 37 mmHg, stroke-volume is only 29, dimensional index is 0.21.  I think we dealing with stage D1 aortic stenosis.  I talked to him about options which include open heart surgery, Tavi, he is willing to explore it however he expressed his unwillingness to potentially going for open heart surgery.  Talk  to him what is the process of working out what is needed for him in for staged should be cardiac catheterization.  He is willing to pursue that way.  Therefore, I explained procedure to him including all risk benefits as well as alternative.  We will schedule him to have a cardiac catheterization.  Based on that will decide which way will be best approach his problem.  Also told him since he does have symptoms of survival of people without any intervention with significant aortic stenosis is between 3 and 5 years.  He understood that and he is still willing to proceed with potential intervention. 2.  Remote history of coronary artery disease will do cardiac catheterization to clarify status of his coronary arteries. 3.  Essential hypertension blood pressure well controlled continue present management. 4.  Dyspnea on exertion most likely related to aortic stenosis. 5.  History of CVA in November last year with very nice recovery.  Noted  Medication Adjustments/Labs and Tests Ordered: Current medicines are reviewed at length with the patient today.  Concerns regarding medicines are outlined above.  Orders Placed This Encounter  Procedures  . DG Chest 2 View  . Basic metabolic panel  . CBC  . EKG 12-Lead   Medication changes: No orders of the defined types were placed in this encounter.   Signed, Georgeanna Lea, MD, [facility] 08/15/2020 3:12 PM    [facility]

## 2020-08-15 NOTE — H&P (View-Only) (Signed)
Cardiology Office Note:    Date:  08/15/2020   ID:  Michael Kidd, DOB 10/17/1930, MRN [id number]  PCP:  Barron Alvine, MD  Cardiologist:  Gypsy Balsam, MD    Referring MD: Barron Alvine, MD   No chief complaint on file. I am weak tired and exhausted also have shortness of breath  History of Present Illness:    Michael Kidd is a 86 y.o. male with past medical history significant for coronary artery disease, PTCA and stenting done many years ago, essential hypertension, dyslipidemia, last November he presented to the hospital with CVA, likely recovered quite nicely.  He does have implantable loop recorder look for potential atrial fibrillation as a reason for his CVA.  He came to me about a month ago complaining of being tired exhausted as well as shortness of breath.  He complained that he cannot do anything.  I ended up ordering another recording which showed what appears to be significant aortic stenosis and he is here to talk about that.  Still complain of having fatigue tiredness as well as shortness of breath while walking.  Denies having dizziness or passing out.  Past Medical History:  Diagnosis Date  . Arthritis   . Bilateral chronic knee pain   . BPH (benign prostatic hyperplasia)   . Dysthymia   . HTN (hypertension)   . Renal cyst     Past Surgical History:  Procedure Laterality Date  . LOOP RECORDER INSERTION N/A 08/28/2019   Procedure: LOOP RECORDER INSERTION;  Surgeon: Marinus Maw, MD;  Location: [facility] INVASIVE CV LAB;  Service: Cardiovascular;  Laterality: N/A;  . PERCUTANEOUS CORONARY STENT INTERVENTION (PCI-S)      Current Medications: Current Meds  Medication Sig  . acetaminophen (TYLENOL) 650 MG CR tablet Take 650 mg by mouth every 8 (eight) hours as needed for pain.  Marland Kitchen albuterol (VENTOLIN HFA) 108 (90 Base) MCG/ACT inhaler Inhale 2 puffs into the lungs every 6 (six) hours as needed.  Marland Kitchen atorvastatin (LIPITOR) 40 MG tablet Take 1 tablet (40 mg total) by mouth  daily at 6 PM.  . clopidogrel (PLAVIX) 75 MG tablet Take 1 tablet (75 mg total) by mouth daily.  . finasteride (PROSCAR) 5 MG tablet Take 5 mg by mouth daily.  Marland Kitchen lisinopril (ZESTRIL) 5 MG tablet Take 5 mg by mouth daily.  . metoprolol tartrate (LOPRESSOR) 25 MG tablet Take 12.5 mg by mouth 2 (two) times daily.  Marland Kitchen omeprazole (PRILOSEC) 20 MG capsule Take 20 mg by mouth daily.  . tamsulosin (FLOMAX) 0.4 MG CAPS capsule Take 0.4 mg by mouth daily.     Allergies:   Patient has no known allergies.   Social History   Socioeconomic History  . Marital status: Married    Spouse name: Not on file  . Number of children: Not on file  . Years of education: Not on file  . Highest education level: Not on file  Occupational History  . Not on file  Tobacco Use  . Smoking status: Former Games developer  . Smokeless tobacco: Never Used  Vaping Use  . Vaping Use: Never used  Substance and Sexual Activity  . Alcohol use: Not Currently  . Drug use: Never  . Sexual activity: Not on file  Other Topics Concern  . Not on file  Social History Narrative  . Not on file   Social Determinants of Health   Financial Resource Strain:   . Difficulty of Paying Living Expenses: Not on file  Food Insecurity:   .  Worried About Running Out of Food in the Last Year: Not on file  . Ran Out of Food in the Last Year: Not on file  Transportation Needs:   . Lack of Transportation (Medical): Not on file  . Lack of Transportation (Non-Medical): Not on file  Physical Activity:   . Days of Exercise per Week: Not on file  . Minutes of Exercise per Session: Not on file  Stress:   . Feeling of Stress : Not on file  Social Connections:   . Frequency of Communication with Friends and Family: Not on file  . Frequency of Social Gatherings with Friends and Family: Not on file  . Attends Religious Services: Not on file  . Active Member of Clubs or Organizations: Not on file  . Attends Club or Organization Meetings: Not on file   . Marital Status: Not on file     Family History: The patient's family history includes Diabetes in his mother; Stroke in his father. ROS:   Please see the history of present illness.    All 14 point review of systems negative except as described per history of present illness  EKGs/Labs/Other Studies Reviewed:      Recent Labs: 08/26/2019: ALT 17 08/28/2019: BUN 18; Creatinine, Ser 1.09; Hemoglobin 14.2; Platelets 202; Potassium 3.8; Sodium 141  Recent Lipid Panel    Component Value Date/Time   CHOL 124 08/27/2019 0532   TRIG 113 08/27/2019 0532   HDL 30 (L) 08/27/2019 0532   CHOLHDL 4.1 08/27/2019 0532   VLDL 23 08/27/2019 0532   LDLCALC 71 08/27/2019 0532    Physical Exam:    VS:  BP 140/68   Pulse 64   Ht 5' 11" (1.803 m)   Wt 211 lb 6.4 oz (95.9 kg)   SpO2 96%   BMI 29.48 kg/m     Wt Readings from Last 3 Encounters:  08/15/20 211 lb 6.4 oz (95.9 kg)  07/16/20 210 lb 3.2 oz (95.3 kg)  01/19/20 208 lb 3.2 oz (94.4 kg)     GEN:  Well nourished, well developed in no acute distress HEENT: Normal NECK: No JVD; No carotid bruits LYMPHATICS: No lymphadenopathy CARDIAC: RRR, systolic ejection murmur grade 2/6 to 3/6 best heard right upper portion of the sternum, no rubs, no gallops RESPIRATORY:  Clear to auscultation without rales, wheezing or rhonchi  ABDOMEN: Soft, non-tender, non-distended MUSCULOSKELETAL:  No edema; No deformity  SKIN: Warm and dry LOWER EXTREMITIES: no swelling NEUROLOGIC:  Alert and oriented x 3 PSYCHIATRIC:  Normal affect   ASSESSMENT:    1. Aortic valve stenosis, etiology of cardiac valve disease unspecified   2. Coronary artery disease involving native coronary artery of native heart without angina pectoris   3. Nonrheumatic aortic valve stenosis   4. Essential hypertension   5. Mixed hyperlipidemia   6. Dyspnea on exertion    PLAN:    In order of problems listed above:  Aortic stenosis which appears to be hemodynamically  significant is stage D 1, his symptoms include shortness of breath fatigue and tiredness.  He complained that he cannot do anything, parameters of his aortic valve are calculated aortic valve area 0.67 cm, mean creatinine 37 mmHg, stroke-volume is only 29, dimensional index is 0.21.  I think we dealing with stage D1 aortic stenosis.  I talked to him about options which include open heart surgery, Tavi, he is willing to explore it however he expressed his unwillingness to potentially going for open heart surgery.  Talk   to him what is the process of working out what is needed for him in for staged should be cardiac catheterization.  He is willing to pursue that way.  Therefore, I explained procedure to him including all risk benefits as well as alternative.  We will schedule him to have a cardiac catheterization.  Based on that will decide which way will be best approach his problem.  Also told him since he does have symptoms of survival of people without any intervention with significant aortic stenosis is between 3 and 5 years.  He understood that and he is still willing to proceed with potential intervention. 2.  Remote history of coronary artery disease will do cardiac catheterization to clarify status of his coronary arteries. 3.  Essential hypertension blood pressure well controlled continue present management. 4.  Dyspnea on exertion most likely related to aortic stenosis. 5.  History of CVA in November last year with very nice recovery.  Noted  Medication Adjustments/Labs and Tests Ordered: Current medicines are reviewed at length with the patient today.  Concerns regarding medicines are outlined above.  Orders Placed This Encounter  Procedures  . DG Chest 2 View  . Basic metabolic panel  . CBC  . EKG 12-Lead   Medication changes: No orders of the defined types were placed in this encounter.   Signed, Jahmeek Shirk J. Debroh Sieloff, MD, FACC 08/15/2020 3:12 PM    [facility] 

## 2020-08-15 NOTE — Patient Instructions (Addendum)
Medication Instructions:  Your physician recommends that you continue on your current medications as directed. Please refer to the Current Medication list given to you today.  *If you need a refill on your cardiac medications before your next appointment, please call your pharmacy*   Lab Work: Your physician recommends that you return for lab work today: bmp, cbc   If you have labs (blood work) drawn today and your tests are completely normal, you will receive your results only by: Marland Kitchen MyChart Message (if you have MyChart) OR . A paper copy in the mail If you have any lab test that is abnormal or we need to change your treatment, we will call you to review the results.   Testing/Procedures: A chest x-ray takes a picture of the organs and structures inside the chest, including the heart, lungs, and blood vessels. This test can show several things, including, whether the heart is enlarges; whether fluid is building up in the lungs; and whether pacemaker / defibrillator leads are still in place. Go to [place] [facility] out patient center today for this.     [facility] [place] [address] [place] [address] Dept: [phone] Loc: [phone]  [place] Boy  08/15/2020  You are scheduled for a Cardiac Catheterization on Thursday, September 16 with Dr. Bryan Lemma.  1. Please arrive at the [facility] (Main Entrance A) at [facility]: [address] [place], [place] [address] at 7:00 AM (This time is two hours before your procedure to ensure your preparation). Free valet parking service is available.   Special note: Every effort is made to have your procedure done on time. Please understand that emergencies sometimes delay scheduled procedures.  2. Diet: Do not eat solid foods after midnight.  The patient may have clear liquids until 5am upon the day of the procedure.  3. Labs: You will need to have labs  drawn today.   4. Medication instructions in preparation for your procedure:      On the morning of your procedure, take your Aspirin and any morning medicines NOT listed above.  You may use sips of water.  5. Plan for one night stay--bring personal belongings. 6. Bring a current list of your medications and current insurance cards. 7. You MUST have a responsible person to drive you home. 8. Someone MUST be with you the first 24 hours after you arrive home or your discharge will be delayed. 9. Please wear clothes that are easy to get on and off and wear slip-on shoes.  Thank you for allowing [place] to care for you!   -- [facility]    Follow-Up: At [facility], you and your health needs are our priority.  As part of our continuing mission to provide you with exceptional heart care, we have created designated Provider Care Teams.  These Care Teams include your primary Cardiologist (physician) and Advanced Practice Providers (APPs -  Physician Assistants and Nurse Practitioners) who all work together to provide you with the care you need, when you need it.  We recommend signing up for the patient portal called "MyChart".  Sign up information is provided on this After Visit Summary.  MyChart is used to connect with patients for Virtual Visits (Telemedicine).  Patients are able to view lab/test results, encounter notes, upcoming appointments, etc.  Non-urgent messages can be sent to your provider as well.   To learn more about what you can  do with MyChart, go to [email or web address].    Your next appointment:   1 month(s)  The format for your next appointment:   In Person  Provider:   Gypsy Balsam, MD   Other Instructions   Coronary Angiogram With Stent Coronary angiogram with stent placement is a procedure to widen or open a narrow blood vessel of the heart (coronary artery). Arteries may become blocked by cholesterol buildup (plaques) in  the lining of the artery wall. When a coronary artery becomes partially blocked, blood flow to that area decreases. This may lead to chest pain or a heart attack (myocardial infarction). A stent is a small piece of metal that looks like mesh or spring. Stent placement may be done as treatment after a heart attack, or to prevent a heart attack if a blocked artery is found by a coronary angiogram. Let your health care provider know about:  Any allergies you have, including allergies to medicines or contrast dye.  All medicines you are taking, including vitamins, herbs, eye drops, creams, and over-the-counter medicines.  Any problems you or family members have had with anesthetic medicines.  Any blood disorders you have.  Any surgeries you have had.  Any medical conditions you have, including kidney problems or kidney failure.  Whether you are pregnant or may be pregnant.  Whether you are breastfeeding. What are the risks? Generally, this is a safe procedure. However, serious problems may occur, including:  Damage to nearby structures or organs, such as the heart, blood vessels, or kidneys.  A return of blockage.  Bleeding, infection, or bruising at the insertion site.  A collection of blood under the skin (hematoma) at the insertion site.  A blood clot in another part of the body.  Allergic reaction to medicines or dyes.  Bleeding into the abdomen (retroperitoneal bleeding).  Stroke (rare).  Heart attack (rare). What happens before the procedure? Staying hydrated Follow instructions from your health care provider about hydration, which may include:  Up to 2 hours before the procedure - you may continue to drink clear liquids, such as water, clear fruit juice, black coffee, and plain tea.  Eating and drinking restrictions Follow instructions from your health care provider about eating and drinking, which may include:  8 hours before the procedure - stop eating heavy meals  or foods, such as meat, fried foods, or fatty foods.  6 hours before the procedure - stop eating light meals or foods, such as toast or cereal.  2 hours before the procedure - stop drinking clear liquids. Medicines Ask your health care provider about:  Changing or stopping your regular medicines. This is especially important if you are taking diabetes medicines or blood thinners.  Taking medicines such as aspirin and ibuprofen. These medicines can thin your blood. Do not take these medicines unless your health care provider tells you to take them. ? Generally, aspirin is recommended before a thin tube, called a catheter, is passed through a blood vessel and inserted into the heart (cardiac catheterization).  Taking over-the-counter medicines, vitamins, herbs, and supplements. General instructions  Do not use any products that contain nicotine or tobacco for at least 4 weeks before the procedure. These products include cigarettes, e-cigarettes, and chewing tobacco. If you need help quitting, ask your health care provider.  Plan to have someone take you home from the hospital or clinic.  If you will be going home right after the procedure, plan to have someone with you for 24 hours.  You may  have tests and imaging procedures.  Ask your health care provider: ? How your insertion site will be marked. Ask which artery will be used for the procedure. ? What steps will be taken to help prevent infection. These may include:  Removing hair at the insertion site.  Washing skin with a germ-killing soap.  Taking antibiotic medicine. What happens during the procedure?   An IV will be inserted into one of your veins.  Electrodes may be placed on your chest to monitor your heart rate during the procedure.  You will be given one or more of the following: ? A medicine to help you relax (sedative). ? A medicine to numb the area (local anesthetic) for catheter insertion.  A small incision  will be made for catheter insertion.  The catheter will be inserted into an artery using a guide wire. The location may be in your groin, your wrist, or the fold of your arm (near your elbow).  An X-ray procedure (fluoroscopy) will be used to help guide the catheter to the opening of the heart arteries.  A dye will be injected into the catheter. X-rays will be taken. The dye helps to show where any narrowing or blockages are located in the arteries.  Tell your health care provider if you have chest pain or trouble breathing.  A tiny wire will be guided to the blocked spot, and a balloon will be inflated to make the artery wider.  The stent will be expanded to crush the plaques into the wall of the vessel. The stent will hold the area open and improve the blood flow. Most stents have a drug coating to reduce the risk of the stent narrowing over time.  The artery may be made wider using a drill, laser, or other tools that remove plaques.  The catheter will be removed when the blood flow improves. The stent will stay where it was placed, and the lining of the artery will grow over it.  A bandage (dressing) will be placed on the insertion site. Pressure will be applied to stop bleeding.  The IV will be removed. This procedure may vary among health care providers and hospitals. What happens after the procedure?  Your blood pressure, heart rate, breathing rate, and blood oxygen level will be monitored until you leave the hospital or clinic.  If the procedure is done through the leg, you will lie flat in bed for a few hours or for as long as told by your health care provider. You will be instructed not to bend or cross your legs.  The insertion site and the pulse in your foot or wrist will be checked often.  You may have more blood tests, X-rays, and a test that records the electrical activity of your heart (electrocardiogram, or ECG).  Do not drive for 24 hours if you were given a sedative  during your procedure. Summary  Coronary angiogram with stent placement is a procedure to widen or open a narrowed coronary artery. This is done to treat heart problems.  Before the procedure, let your health care provider know about all the medical conditions and surgeries you have or have had.  This is a safe procedure. However, some problems may occur, including damage to nearby structures or organs, bleeding, blood clots, or allergies.  Follow your health care provider's instructions about eating, drinking, medicines, and other lifestyle changes, such as quitting tobacco use before the procedure. This information is not intended to replace advice given to you  by your health care provider. Make sure you discuss any questions you have with your health care provider. Document Revised: 06/14/2019 Document Reviewed: 06/14/2019 Elsevier Patient Education  2020 [employer].

## 2020-08-16 ENCOUNTER — Telehealth: Payer: Self-pay | Admitting: Cardiology

## 2020-08-16 LAB — CBC
Hematocrit: 42 % (ref 37.5–51.0)
Hemoglobin: 14.4 g/dL (ref 13.0–17.7)
MCH: 30.6 pg (ref 26.6–33.0)
MCHC: 34.3 g/dL (ref 31.5–35.7)
MCV: 89 fL (ref 79–97)
Platelets: 224 10*3/uL (ref 150–450)
RBC: 4.7 x10E6/uL (ref 4.14–5.80)
RDW: 14.4 % (ref 11.6–15.4)
WBC: 6.2 10*3/uL (ref 3.4–10.8)

## 2020-08-16 LAB — BASIC METABOLIC PANEL
BUN/Creatinine Ratio: 15 (ref 10–24)
BUN: 17 mg/dL (ref 8–27)
CO2: 24 mmol/L (ref 20–29)
Calcium: 9.1 mg/dL (ref 8.6–10.2)
Chloride: 106 mmol/L (ref 96–106)
Creatinine, Ser: 1.17 mg/dL (ref 0.76–1.27)
GFR calc Af Amer: 64 mL/min/{1.73_m2} (ref >59)
GFR calc non Af Amer: 56 mL/min/{1.73_m2} — ABNORMAL LOW (ref >59)
Glucose: 104 mg/dL — ABNORMAL HIGH (ref 65–99)
Potassium: 4.4 mmol/L (ref 3.5–5.2)
Sodium: 142 mmol/L (ref 134–144)

## 2020-08-16 NOTE — Telephone Encounter (Signed)
New message:     Patient wife calling asking do her husband need to be off any medication before his apt on 08/22/20. Please call patient wife

## 2020-08-16 NOTE — Telephone Encounter (Signed)
Called patient wife per dpr. Informed her patient doesn't need to take aspirin before cath because he is taking plavix. No further questions.

## 2020-08-17 IMAGING — CT CT ANGIO NECK
1 of 11 series · 5 of 33 positions shown · IV contrast (omnipaque)
Comparison: Head CT 04/09/2012

CLINICAL DATA: Right facial droop and slurred speech.

EXAM:
CT ANGIOGRAPHY HEAD AND NECK
TECHNIQUE: Multidetector CT imaging of the head and neck was performed using
the standard protocol during bolus administration of intravenous
contrast. Multiplanar CT image reconstructions and MIPs were
obtained to evaluate the vascular anatomy. Carotid stenosis
measurements (when applicable) are obtained utilizing NASCET
criteria, using the distal internal carotid diameter as the
denominator.
CONTRAST:  100mL OMNIPAQUE IOHEXOL 350 MG/ML SOLN

[Series 11: cta neck axial · axial · 0.40mm/px · z∈[-198,+3]mm · 5 of 303 slices shown]
[im 51/303  soft-tissue]
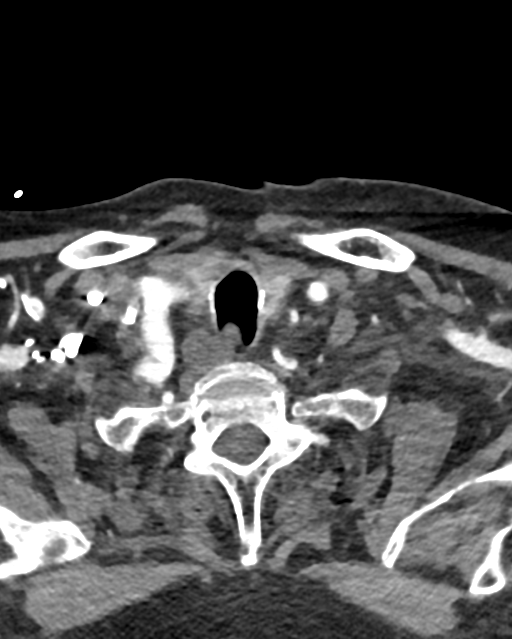
[im 101/303  bone]
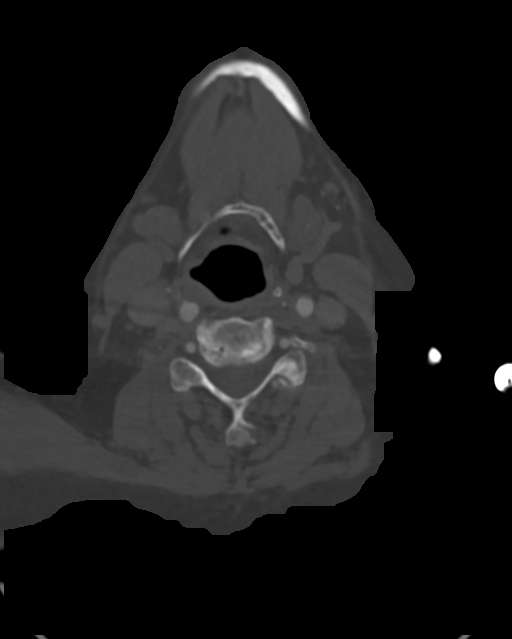
[im 152/303  soft-tissue]
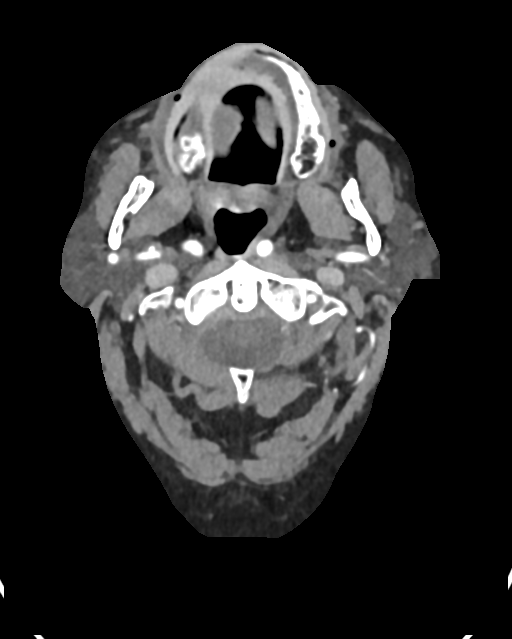
[im 202/303  bone]
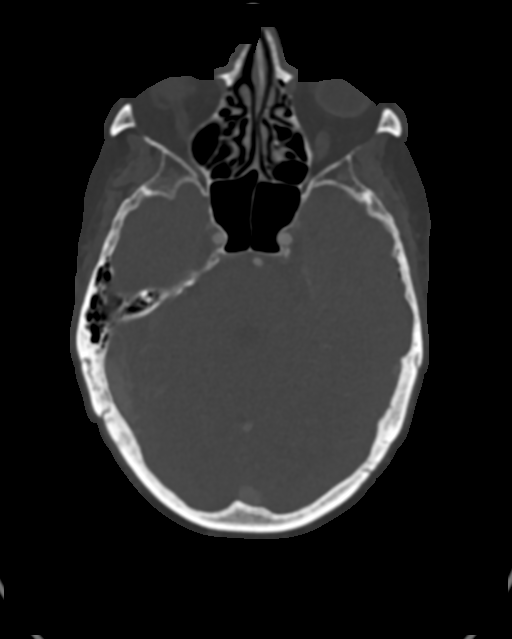
[im 252/303  soft-tissue]
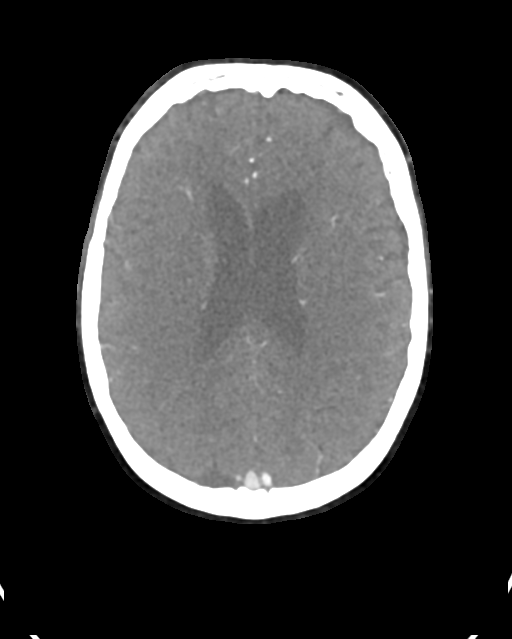

[5 of 33 positions shown; findings below may reference images not displayed]

FINDINGS: CT HEAD FINDINGS

Brain: There is no evidence of acute infarct, intracranial
hemorrhage, mass, midline shift, or extra-axial fluid collection.
There are small chronic infarcts in the left cerebellum. Cerebral
white matter hypodensities are nonspecific but compatible with mild
chronic small vessel ischemic disease. The ventricles and sulci are
within normal limits for age.

Vascular: Calcified atherosclerosis at the skull base. No hyperdense
vessel.

Skull: No fracture or focal osseous lesion.

Sinuses: Mild mucosal thickening in the right greater than left
maxillary sinuses. Clear mastoid air cells.

Orbits: Bilateral cataract extraction.

Review of the MIP images confirms the above findings

CTA NECK FINDINGS

Aortic arch: Normal variant 4 vessel aortic arch with the left
vertebral artery arising from the arch. Mild atherosclerosis without
significant arch vessel origin stenosis.

Right carotid system: Patent with mild atherosclerotic plaque most
notable at the carotid bifurcation. No evidence of significant
stenosis or dissection. Tortuous mid to distal cervical ICA.

Left carotid system: Patent with mild atherosclerotic plaque most
notable at the carotid bifurcation. No evidence of significant
stenosis or dissection. Tortuous mid cervical ICA.

Vertebral arteries: Patent with the right being slightly larger than
the left. Mild calcified plaque at the right vertebral artery
origin. No evidence of significant stenosis or dissection.

Skeleton: Moderate diffuse cervical disc degeneration.

Other neck: No evidence of cervical lymphadenopathy or mass.

Upper chest: Mild motion artifact through the lung apices. No
consolidation.

Review of the MIP images confirms the above findings

CTA HEAD FINDINGS

Anterior circulation: The internal carotid arteries are patent from
skull base to carotid termini with mild atherosclerotic plaque
bilaterally not resulting in significant stenosis. ACAs and MCAs are
patent with mild irregularity predominantly involving the branch
vessels but no evidence of proximal branch occlusion or flow
limiting proximal stenosis. There is a mild proximal left M1
stenosis. No aneurysm is identified.

Posterior circulation: The intracranial vertebral arteries are
patent to the basilar with mild nonstenotic plaque bilaterally.
Patent PICA and SCA origins are visualized bilaterally. The basilar
artery is widely patent. There is a large left posterior
communicating artery with hypoplastic left P1 segment. The PCAs are
patent with mild irregularity bilaterally but no significant
proximal stenosis. No aneurysm is identified.

Venous sinuses: Patent.

Anatomic variants: Fetal left PCA.

Review of the MIP images confirms the above findings
IMPRESSION: 1. No evidence of acute intracranial abnormality.
2. Mild chronic small vessel ischemic disease with small chronic
cerebellar infarcts.
3. Mild atherosclerosis in the head and neck without large vessel
occlusion or significant proximal stenosis.

## 2020-08-19 ENCOUNTER — Other Ambulatory Visit (HOSPITAL_COMMUNITY)
Admission: RE | Admit: 2020-08-19 | Discharge: 2020-08-19 | Disposition: A | Discharge status: Home / Self Care | Payer: Medicare HMO | Source: Ambulatory Visit | Attending: Cardiology | Admitting: Cardiology

## 2020-08-19 ENCOUNTER — Telehealth: Payer: Self-pay | Admitting: Cardiology

## 2020-08-19 DIAGNOSIS — Z01812 Encounter for preprocedural laboratory examination: Secondary | ICD-10-CM | POA: Diagnosis present

## 2020-08-19 DIAGNOSIS — Z20822 Contact with and (suspected) exposure to covid-19: Secondary | ICD-10-CM | POA: Insufficient documentation

## 2020-08-19 NOTE — Telephone Encounter (Signed)
Follow up:      Patient returning call back for results. 

## 2020-08-19 NOTE — Telephone Encounter (Signed)
Patient wife per dpr informed of results. 

## 2020-08-20 ENCOUNTER — Telehealth: Payer: Self-pay | Admitting: *Deleted

## 2020-08-20 LAB — SARS CORONAVIRUS 2 (TAT 6-24 HRS): SARS Coronavirus 2: NEGATIVE

## 2020-08-20 NOTE — Telephone Encounter (Signed)
Pt contacted pre-catheterization scheduled at [facility] for: Thursday August 22, 2020 9 AM Verified arrival time and place: [facility] Main Entrance [facility]) at: 7 AM    No solid food after midnight prior to cath, clear liquids until 5 AM day of procedure.  Hold: Lisinopril-day before and day of procedure -GFR 56  Except hold medications AM meds can be  taken pre-cath with sips of water including: ASA 81 mg Plavix 75 mg  Confirmed patient has responsible adult to drive home post procedure and be with patient first 24 hours after arriving home: yes  You are allowed ONE visitor in the waiting room during the time you are at the hospital for your procedure. Both you and your visitor must wear a mask once you enter the hospital.       COVID-19 Pre-Screening Questions:   In the past 10 days have you had a new cough, shortness of breath, headache, congestion, fever (100 or greater) unexplained body aches, new sore throat, or sudden loss of taste or sense of smell? no  In the past 10 days have you been around anyone with known Covid 19? no  Have you been vaccinated for COVID-19? no       Reviewed procedure/mask/visitor instructions, COVID-19 screening questions with patient's wife (DPR), [place].

## 2020-08-22 ENCOUNTER — Ambulatory Visit (HOSPITAL_COMMUNITY)
Admission: RE | Admit: 2020-08-22 | Discharge: 2020-08-22 | Disposition: A | Discharge status: Home / Self Care | Payer: Medicare HMO | Attending: Cardiology | Admitting: Cardiology

## 2020-08-22 ENCOUNTER — Ambulatory Visit (HOSPITAL_COMMUNITY)
Admission: RE | Disposition: A | Discharge status: Home / Self Care | Payer: Medicare HMO | Source: Home / Self Care | Attending: Cardiology

## 2020-08-22 DIAGNOSIS — Z955 Presence of coronary angioplasty implant and graft: Secondary | ICD-10-CM | POA: Insufficient documentation

## 2020-08-22 DIAGNOSIS — Z8673 Personal history of transient ischemic attack (TIA), and cerebral infarction without residual deficits: Secondary | ICD-10-CM | POA: Insufficient documentation

## 2020-08-22 DIAGNOSIS — Z87891 Personal history of nicotine dependence: Secondary | ICD-10-CM | POA: Diagnosis not present

## 2020-08-22 DIAGNOSIS — I2510 Atherosclerotic heart disease of native coronary artery without angina pectoris: Secondary | ICD-10-CM | POA: Diagnosis not present

## 2020-08-22 DIAGNOSIS — I10 Essential (primary) hypertension: Secondary | ICD-10-CM | POA: Insufficient documentation

## 2020-08-22 DIAGNOSIS — R0609 Other forms of dyspnea: Secondary | ICD-10-CM | POA: Diagnosis not present

## 2020-08-22 DIAGNOSIS — Z7902 Long term (current) use of antithrombotics/antiplatelets: Secondary | ICD-10-CM | POA: Insufficient documentation

## 2020-08-22 DIAGNOSIS — E785 Hyperlipidemia, unspecified: Secondary | ICD-10-CM | POA: Diagnosis present

## 2020-08-22 DIAGNOSIS — E782 Mixed hyperlipidemia: Secondary | ICD-10-CM | POA: Insufficient documentation

## 2020-08-22 DIAGNOSIS — I350 Nonrheumatic aortic (valve) stenosis: Secondary | ICD-10-CM | POA: Insufficient documentation

## 2020-08-22 DIAGNOSIS — Z79899 Other long term (current) drug therapy: Secondary | ICD-10-CM | POA: Insufficient documentation

## 2020-08-22 HISTORY — PX: RIGHT/LEFT HEART CATH AND CORONARY ANGIOGRAPHY: CATH118266

## 2020-08-22 SURGERY — RIGHT/LEFT HEART CATH AND CORONARY ANGIOGRAPHY
Anesthesia: LOCAL

## 2020-08-22 MED ORDER — FENTANYL CITRATE (PF) 100 MCG/2ML IJ SOLN
INTRAMUSCULAR | Status: DC | PRN
Start: 2020-08-22 — End: 2020-08-22
  Administered 2020-08-22: 25 ug via INTRAVENOUS

## 2020-08-22 MED ORDER — MIDAZOLAM HCL 2 MG/2ML IJ SOLN
INTRAMUSCULAR | Status: AC
  Filled 2020-08-22: qty 2

## 2020-08-22 MED ORDER — SODIUM CHLORIDE 0.9 % IV SOLN: INTRAVENOUS | Status: AC

## 2020-08-22 MED ORDER — SODIUM CHLORIDE 0.9 % IV SOLN: 250.0000 mL | INTRAVENOUS | Status: DC | PRN

## 2020-08-22 MED ORDER — LABETALOL HCL 5 MG/ML IV SOLN: 10.0000 mg | INTRAVENOUS | Status: DC | PRN

## 2020-08-22 MED ORDER — CLOPIDOGREL BISULFATE 75 MG PO TABS: 75.0000 mg | ORAL_TABLET | ORAL | Status: DC

## 2020-08-22 MED ORDER — ACETAMINOPHEN 325 MG PO TABS: 650.0000 mg | ORAL_TABLET | ORAL | Status: DC | PRN

## 2020-08-22 MED ORDER — SODIUM CHLORIDE 0.9 % WEIGHT BASED INFUSION: 1.0000 mL/kg/h | INTRAVENOUS | Status: DC

## 2020-08-22 MED ORDER — FENTANYL CITRATE (PF) 100 MCG/2ML IJ SOLN
INTRAMUSCULAR | Status: AC
  Filled 2020-08-22: qty 2

## 2020-08-22 MED ORDER — ASPIRIN 81 MG PO CHEW: 81.0000 mg | CHEWABLE_TABLET | ORAL | Status: DC

## 2020-08-22 MED ORDER — LIDOCAINE HCL (PF) 1 % IJ SOLN
INTRAMUSCULAR | Status: AC
  Filled 2020-08-22: qty 30

## 2020-08-22 MED ORDER — HEPARIN (PORCINE) IN NACL 1000-0.9 UT/500ML-% IV SOLN
INTRAVENOUS | Status: AC
  Filled 2020-08-22: qty 1000

## 2020-08-22 MED ORDER — SODIUM CHLORIDE 0.9% FLUSH: 3.0000 mL | Freq: Two times a day (BID) | INTRAVENOUS | Status: DC

## 2020-08-22 MED ORDER — ONDANSETRON HCL 4 MG/2ML IJ SOLN: 4.0000 mg | Freq: Four times a day (QID) | INTRAMUSCULAR | Status: DC | PRN

## 2020-08-22 MED ORDER — HYDRALAZINE HCL 20 MG/ML IJ SOLN
10.0000 mg | INTRAMUSCULAR | Status: DC | PRN
  Administered 2020-08-22: 10 mg via INTRAVENOUS
  Filled 2020-08-22: qty 1

## 2020-08-22 MED ORDER — MIDAZOLAM HCL 2 MG/2ML IJ SOLN
INTRAMUSCULAR | Status: DC | PRN
  Administered 2020-08-22: 1 mg via INTRAVENOUS

## 2020-08-22 MED ORDER — VERAPAMIL HCL 2.5 MG/ML IV SOLN
INTRAVENOUS | Status: DC | PRN
  Administered 2020-08-22: 10 mL via INTRA_ARTERIAL

## 2020-08-22 MED ORDER — IOHEXOL 350 MG/ML SOLN
INTRAVENOUS | Status: DC | PRN
  Administered 2020-08-22: 50 mL

## 2020-08-22 MED ORDER — SODIUM CHLORIDE 0.9% FLUSH: 3.0000 mL | INTRAVENOUS | Status: DC | PRN

## 2020-08-22 MED ORDER — HEPARIN SODIUM (PORCINE) 1000 UNIT/ML IJ SOLN
INTRAMUSCULAR | Status: DC | PRN
  Administered 2020-08-22: 5000 [IU] via INTRAVENOUS

## 2020-08-22 MED ORDER — VERAPAMIL HCL 2.5 MG/ML IV SOLN
INTRAVENOUS | Status: AC
  Filled 2020-08-22: qty 2

## 2020-08-22 MED ORDER — SODIUM CHLORIDE 0.9 % WEIGHT BASED INFUSION
3.0000 mL/kg/h | INTRAVENOUS | Status: AC
  Administered 2020-08-22: 3 mL/kg/h via INTRAVENOUS

## 2020-08-22 MED ORDER — HEPARIN (PORCINE) IN NACL 1000-0.9 UT/500ML-% IV SOLN
INTRAVENOUS | Status: DC | PRN
  Administered 2020-08-22: 1000 mL

## 2020-08-22 MED ORDER — HEPARIN SODIUM (PORCINE) 1000 UNIT/ML IJ SOLN
INTRAMUSCULAR | Status: AC
  Filled 2020-08-22: qty 1

## 2020-08-22 SURGICAL SUPPLY — 13 items
CATH BALLN WEDGE 5F 110CM (CATHETERS) ×2 IMPLANT
CATH OPTITORQUE TIG 4.0 5F (CATHETERS) ×2 IMPLANT
DEVICE RAD COMP TR BAND LRG (VASCULAR PRODUCTS) ×2 IMPLANT
GLIDESHEATH SLEND SS 6F .021 (SHEATH) ×2 IMPLANT
GUIDEWIRE INQWIRE 1.5J.035X260 (WIRE) ×2 IMPLANT
INQWIRE 1.5J .035X260CM (WIRE) ×4
KIT HEART LEFT (KITS) ×2 IMPLANT
PACK CARDIAC CATHETERIZATION (CUSTOM PROCEDURE TRAY) ×2 IMPLANT
SHEATH GLIDE SLENDER 4/5FR (SHEATH) ×2 IMPLANT
SHEATH PROBE COVER 6X72 (BAG) ×2 IMPLANT
TRANSDUCER W/STOPCOCK (MISCELLANEOUS) ×2 IMPLANT
TUBING CIL FLEX 10 FLL-RA (TUBING) ×2 IMPLANT
WIRE EMERALD ST .035X260CM (WIRE) ×2 IMPLANT

## 2020-08-22 NOTE — Discharge Instructions (Signed)
You will be contacted by a member of the Heart Valve Team in order to set up an appointment to see Dr.Cooper to discuss the valve and the blockage in the heart artery and the plan for how this should be managed. Would recommend avoiding any significant exertion until you see Dr. Excell Seltzer. Otherwise continue home medications  Bryan Lemma, MD     Radial Site Care  This sheet gives you information about how to care for yourself after your procedure. Your health care provider may also give you more specific instructions. If you have problems or questions, contact your health care provider. What can I expect after the procedure? After the procedure, it is common to have:  Bruising and tenderness at the catheter insertion area. Follow these instructions at home: Medicines  Take over-the-counter and prescription medicines only as told by your health care provider. Insertion site care  Follow instructions from your health care provider about how to take care of your insertion site. Make sure you: ? Wash your hands with soap and water before you change your bandage (dressing). If soap and water are not available, use hand sanitizer. ? Change your dressing as told by your health care provider. ? Leave stitches (sutures), skin glue, or adhesive strips in place. These skin closures may need to stay in place for 2 weeks or longer. If adhesive strip edges start to loosen and curl up, you may trim the loose edges. Do not remove adhesive strips completely unless your health care provider tells you to do that.  Check your insertion site every day for signs of infection. Check for: ? Redness, swelling, or pain. ? Fluid or blood. ? Pus or a bad smell. ? Warmth.  Do not take baths, swim, or use a hot tub until your health care provider approves.  You may shower 24-48 hours after the procedure, or as directed by your health care provider. ? Remove the dressing and gently wash the site with plain soap  and water. ? Pat the area dry with a clean towel. ? Do not rub the site. That could cause bleeding.  Do not apply powder or lotion to the site. Activity   For 24 hours after the procedure, or as directed by your health care provider: ? Do not flex or bend the affected arm. ? Do not push or pull heavy objects with the affected arm. ? Do not drive yourself home from the hospital or clinic. You may drive 24 hours after the procedure unless your health care provider tells you not to. ? Do not operate machinery or power tools.  Do not lift anything that is heavier than 10 lb (4.5 kg), or the limit that you are told, until your health care provider says that it is safe.  Ask your health care provider when it is okay to: ? Return to work or school. ? Resume usual physical activities or sports. ? Resume sexual activity. General instructions  If the catheter site starts to bleed, raise your arm and put firm pressure on the site. If the bleeding does not stop, get help right away. This is a medical emergency.  If you went home on the same day as your procedure, a responsible adult should be with you for the first 24 hours after you arrive home.  Keep all follow-up visits as told by your health care provider. This is important. Contact a health care provider if:  You have a fever.  You have redness, swelling, or yellow  drainage around your insertion site. Get help right away if:  You have unusual pain at the radial site.  The catheter insertion area swells very fast.  The insertion area is bleeding, and the bleeding does not stop when you hold steady pressure on the area.  Your arm or hand becomes pale, cool, tingly, or numb. These symptoms may represent a serious problem that is an emergency. Do not wait to see if the symptoms will go away. Get medical help right away. Call your local emergency services (911 in the [place] Do not drive yourself to the hospital. Summary  After the  procedure, it is common to have bruising and tenderness at the site.  Follow instructions from your health care provider about how to take care of your radial site wound. Check the wound every day for signs of infection.  Do not lift anything that is heavier than 10 lb (4.5 kg), or the limit that you are told, until your health care provider says that it is safe. This information is not intended to replace advice given to you by your health care provider. Make sure you discuss any questions you have with your health care provider. Document Revised: 12/29/2017 Document Reviewed: 12/29/2017 Elsevier Patient Education  2020 [employer].

## 2020-08-22 NOTE — Brief Op Note (Addendum)
08/22/2020  10:05 AM  PATIENT:  Michael Kidd  86 y.o. male with distant history of CAD & newly diagnosed Severe Aortic Stenosis by Echo. He is symptomatic & is now referred for right and left heart catheterization as part of pre-AVR evaluation.  PRE-OPERATIVE DIAGNOSIS:  Aortic stenosis  POST-OPERATIVE DIAGNOSIS:   Severe in-stent restenosis of the mid LAD (just after D2) roughly 75% otherwise mild to moderate disease throughout a codominant system with small codominant RCA and large circumflex. Relatively normal right heart cath pressures with PCWP of 18 mmHg.  Cardiac output-index of 6.38 and 2.95. Mean AVA gradient 18 mmHg.  PROCEDURE:  Procedure(s): RIGHT/LEFT HEART CATH AND CORONARY ANGIOGRAPHY (N/A)  Right radial access-6 [place] sheath, -> TIG 4.0 catheter used for and left coronary angiography and then used to cross the aortic valve.   Right brachial access, 5 French sheath exchanged from existing IV; 5 [place] Swan-Ganz catheter advanced into the RA, RV, PA and PCWP position.  Pressures and oxygen saturations obtained in appropriate locations.  Catheter removed following recordings.  SURGEON:  Surgeon(s) and Role:    * Marykay Lex, MD - Primary  PHYSICIAN ASSISTANT:   ASSISTANTS: none   ANESTHESIA:   1 mg Versed, 25 mg fentanyl, 3 mL subcu lidocaine for radial and 1 for brachial access.  EBL:  <20 mL  MEDICATIONS USED: 50 mL contrast  TOURNIQUET: TR band 10:00-16 mL air  DICTATION: .Note written in EPIC  PLAN OF CARE: Discharge to home after PACU -> after discussion with TAVR team reference plan for LAD. --> Case discussed with Dr. Excell Seltzer and Cline Crock, PA.  He will be set up to see Dr. Excell Seltzer in the outpatient setting to discuss plans for treatment of the aortic valve and the LAD lesion.  PATIENT DISPOSITION:  PACU - hemodynamically stable.    Bryan Lemma, MD

## 2020-08-22 NOTE — Interval H&P Note (Signed)
History and Physical Interval Note:  08/22/2020 9:18 AM  Michael Kidd  has presented today for surgery, with the diagnosis of Severe Aortic Stenosis.  The various methods of treatment have been discussed with the patient and family. After consideration of risks, benefits and other options for treatment, the patient has consented to  Procedure(s): RIGHT/LEFT HEART CATH AND CORONARY ANGIOGRAPHY (N/A) as a surgical intervention.  The patient's history has been reviewed, patient examined, no change in status, stable for surgery.  I have reviewed the patient's chart and labs.  Questions were answered to the patient's satisfaction.     Bryan Lemma

## 2020-08-23 ENCOUNTER — Encounter (HOSPITAL_COMMUNITY): Payer: Self-pay | Admitting: Cardiology

## 2020-08-23 LAB — POCT I-STAT EG7
Acid-Base Excess: 0 mmol/L (ref 0.0–2.0)
Acid-Base Excess: 1 mmol/L (ref 0.0–2.0)
Bicarbonate: 25.9 mmol/L (ref 20.0–28.0)
Bicarbonate: 26.5 mmol/L (ref 20.0–28.0)
Calcium, Ion: 1.16 mmol/L (ref 1.15–1.40)
Calcium, Ion: 1.17 mmol/L (ref 1.15–1.40)
HCT: 35 % — ABNORMAL LOW (ref 39.0–52.0)
HCT: 35 % — ABNORMAL LOW (ref 39.0–52.0)
Hemoglobin: 11.9 g/dL — ABNORMAL LOW (ref 13.0–17.0)
Hemoglobin: 11.9 g/dL — ABNORMAL LOW (ref 13.0–17.0)
O2 Saturation: 74 %
O2 Saturation: 75 %
Potassium: 3.8 mmol/L (ref 3.5–5.1)
Potassium: 3.9 mmol/L (ref 3.5–5.1)
Sodium: 145 mmol/L (ref 135–145)
Sodium: 145 mmol/L (ref 135–145)
TCO2: 27 mmol/L (ref 22–32)
TCO2: 28 mmol/L (ref 22–32)
pCO2, Ven: 44.1 mmHg (ref 44.0–60.0)
pCO2, Ven: 45.9 mmHg (ref 44.0–60.0)
pH, Ven: 7.37 (ref 7.250–7.430)
pH, Ven: 7.377 (ref 7.250–7.430)
pO2, Ven: 41 mmHg (ref 32.0–45.0)
pO2, Ven: 41 mmHg (ref 32.0–45.0)

## 2020-08-23 LAB — POCT I-STAT 7, (LYTES, BLD GAS, ICA,H+H)
Acid-Base Excess: 0 mmol/L (ref 0.0–2.0)
Bicarbonate: 24.9 mmol/L (ref 20.0–28.0)
Calcium, Ion: 1.22 mmol/L (ref 1.15–1.40)
HCT: 35 % — ABNORMAL LOW (ref 39.0–52.0)
Hemoglobin: 11.9 g/dL — ABNORMAL LOW (ref 13.0–17.0)
O2 Saturation: 98 %
Potassium: 4 mmol/L (ref 3.5–5.1)
Sodium: 144 mmol/L (ref 135–145)
TCO2: 26 mmol/L (ref 22–32)
pCO2 arterial: 40.1 mmHg (ref 32.0–48.0)
pH, Arterial: 7.4 (ref 7.350–7.450)
pO2, Arterial: 101 mmHg (ref 83.0–108.0)

## 2020-08-23 MED FILL — Lidocaine HCl Local Preservative Free (PF) Inj 1%: INTRAMUSCULAR | Qty: 30 | Status: AC

## 2020-08-26 ENCOUNTER — Telehealth: Payer: Self-pay | Admitting: Cardiovascular Disease

## 2020-08-26 NOTE — Telephone Encounter (Signed)
Spoke with the pts wife and the pt was insistent and was already out on his riding [employer], [employer].. I advised her to tell him to hold off pushing the mower if he also does that and to not do any strenuous work until he at least sees Dr. Excell Seltzer 09/05/20 and can get his recommendations. She says she will make him aware and will call if they have any further questions or problems prior to his appt.   She says she is going to have him stop and come in to the house to relax and be sure he hydrates well.

## 2020-08-26 NOTE — Telephone Encounter (Signed)
New message:    Patient calling to see if it is ok to mow the grass. Please call patient wife back.

## 2020-09-05 ENCOUNTER — Other Ambulatory Visit: Payer: Self-pay

## 2020-09-05 ENCOUNTER — Ambulatory Visit (INDEPENDENT_AMBULATORY_CARE_PROVIDER_SITE_OTHER): Payer: Medicare HMO | Admitting: Cardiovascular Disease

## 2020-09-05 ENCOUNTER — Encounter: Payer: Self-pay | Admitting: Cardiovascular Disease

## 2020-09-05 ENCOUNTER — Other Ambulatory Visit: Payer: Self-pay | Admitting: Physician Assistant

## 2020-09-05 VITALS — BP 144/84 | HR 64 | Ht 71.0 in | Wt 212.0 lb

## 2020-09-05 DIAGNOSIS — I350 Nonrheumatic aortic (valve) stenosis: Secondary | ICD-10-CM

## 2020-09-05 NOTE — Patient Instructions (Addendum)
Your Pre-TAVR tests are scheduled at [facility] on 09/13/2020: Phone: [phone]  Please arrive at 7:45AM at Entrance C (Heart and Vascular Building). Parking Code [phone]. Your physician has requested that you have a carotid duplex. This test is an ultrasound of the carotid arteries in your neck. It looks at blood flow through these arteries that supply the brain with blood. Allow one hour for this exam.    After your carotid ultrasound, please proceed to Radiology for your CT scans at 10:00AM: Please follow these instructions carefully:  Hold all erectile dysfunction medications at least 3 days (72 hrs) prior to test.  On the Night Before the Test: . Be sure to Drink plenty of water. . Do not consume any caffeinated/decaffeinated beverages or chocolate 12 hours prior to your test. . Do not take any antihistamines 12 hours prior to your test.  On the Day of the Test: . Drink plenty of water. Do not drink any water within one hour of the test. . Do not eat any food 4 hours prior to the test. . You may take your regular medications prior to the test EXCEPT: HOLD LISINOPRIL the morning of your scan.      After the Test: . Drink plenty of water. . After receiving IV contrast, you may experience a mild flushed feeling. This is normal. . On occasion, you may experience a mild rash up to 24 hours after the test. This is not dangerous. If this occurs, you can take Benadryl 25 mg and increase your fluid intake. . If you experience trouble breathing, this can be serious. If it is severe call 911 IMMEDIATELY. If it is mild, please call our office.  For non-scheduling related questions, please contact the cardiac imaging nurse navigator should you have any questions/concerns: Marchia Bond, Cardiac Imaging Nurse Navigator Burley Saver, Interim Cardiac Imaging Nurse [place] [facility] Direct Office Dial: [phone]   For scheduling needs, including  cancellations and rescheduling, please call Vivien Rota at [phone], option 3.

## 2020-09-05 NOTE — Progress Notes (Signed)
HEART AND VASCULAR CENTER   MULTIDISCIPLINARY HEART VALVE TEAM  Date:  09/06/2020   ID:  Michael Kidd, DOB [birth year], MRN [id number]  PCP:  Barron Alvine, MD   Chief Complaint  Patient presents with  . Shortness of Breath     HISTORY OF PRESENT ILLNESS: Michael Kidd is a 86 y.o. male who presents for evaluation of severe aortic stenosis, referred by Dr Bing Matter.  The patient is here with his grandson-in-law today. He had seen Dr Bing Matter in the distant past, but had been doing well so more recently has followed solely with his primary care physician, Dr Hollice Espy. He was referred back to Dr Bing Matter in February of this year for follow-up of coronary artery disease. He presented with a stroke last year with the primary symptom of expressive aphasia. Symptoms resolved spontaneously and he ultimately underwent loop recorder implantation which has not demonstrated the presence of atrial fibrillation to date. When he saw Dr Bing Matter in August the patient complained of exertional dyspnea and a heart murmur was identified. The patient underwent an echocardiogram demonstrating findings consistent with severe aortic stenosis (see below). He then underwent R/L heart catheterization and is now referred for consideration of treatment options for his coronary artery disease and aortic stenosis.   The patient complains of shortness of breath with walking or with doing yard work. He is also limited by arthritis in his knees. He denies orthopnea, PND, or leg swelling. He denies lightheadedness or syncope. He denies chest pain or pressure.   The patient has full dentures.   The patient had inguinal hernia surgery in the 1970's, otherwise he has had no surgeries. The patient is married and they live independently. They have 2 children. He worked in a '[employer],' now retired for many years.   Past Medical History:  Diagnosis Date  . Arthritis   . Bilateral chronic knee pain   . BPH (benign prostatic  hyperplasia)   . Dysthymia   . HTN (hypertension)   . Renal cyst     Current Outpatient Medications  Medication Sig Dispense Refill  . acetaminophen (TYLENOL) 650 MG CR tablet Take 325 mg by mouth every 8 (eight) hours as needed for pain.     Marland Kitchen albuterol (VENTOLIN HFA) 108 (90 Base) MCG/ACT inhaler Inhale 2 puffs into the lungs every 6 (six) hours as needed.     Marland Kitchen atorvastatin (LIPITOR) 40 MG tablet Take 1 tablet (40 mg total) by mouth daily at 6 PM. 30 tablet 11  . clopidogrel (PLAVIX) 75 MG tablet Take 1 tablet (75 mg total) by mouth daily. 30 tablet 11  . finasteride (PROSCAR) 5 MG tablet Take 5 mg by mouth daily.    . fluticasone (FLONASE) 50 MCG/ACT nasal spray Place 2 sprays into both nostrils daily as needed for allergies or rhinitis.    . hydroxypropyl methylcellulose / hypromellose (ISOPTO TEARS / GONIOVISC) 2.5 % ophthalmic solution Place 1 drop into both eyes 3 (three) times daily as needed for dry eyes.    Marland Kitchen lisinopril (ZESTRIL) 5 MG tablet Take 5 mg by mouth daily.    . metoprolol tartrate (LOPRESSOR) 25 MG tablet Take 12.5 mg by mouth 2 (two) times daily.    Marland Kitchen omeprazole (PRILOSEC) 20 MG capsule Take 20 mg by mouth daily.     . tamsulosin (FLOMAX) 0.4 MG CAPS capsule Take 0.4 mg by mouth daily.     No current facility-administered medications for this visit.    ALLERGIES:   Patient  has no known allergies.   SOCIAL HISTORY:  The patient  reports that he has quit smoking. He has never used smokeless tobacco. He reports previous alcohol use. He reports that he does not use drugs.   FAMILY HISTORY:  The patient's family history includes Diabetes in his mother; Stroke in his father.   REVIEW OF SYSTEMS:  Positive for bilateral knee pain, mild memory loss.   All other systems are reviewed and negative.   PHYSICAL EXAM: VS:  BP (!) 144/84   Pulse 64   Ht 5\' 11"  (1.803 m)   Wt 212 lb (96.2 kg)   SpO2 100%   BMI 29.57 kg/m  , BMI Body mass index is 29.57 kg/m. GEN: Well  nourished, well developed, in no acute distress HEENT: normal Neck: No JVD. carotids 2+ without bruits or masses Cardiac: The heart is RRR with grade 3/6 harsh late peaking systolic murmur at the right upper sternal border.  No edema. Pedal pulses 2+ = bilaterally  Respiratory:  clear to auscultation bilaterally GI: soft, nontender, nondistended, + BS MS: no deformity or atrophy Skin: warm and dry, no rash Neuro:  Strength and sensation are intact Psych: euthymic mood, full affect  EKG:  EKG from 08/15/2020 reviewed and demonstrates NSR, within normal limits  RECENT LABS: 08/15/2020: BUN 17; Creatinine, Ser 1.17; Platelets 224 08/22/2020: Hemoglobin 11.9; Hemoglobin 11.9; Potassium 3.8; Potassium 3.9; Sodium 145; Sodium 145  No results found for requested labs within last 8760 hours.   CrCl cannot be calculated (Patient's most recent lab result is older than the maximum 21 days allowed.).   Wt Readings from Last 3 Encounters:  09/05/20 212 lb (96.2 kg)  08/22/20 211 lb (95.7 kg)  08/15/20 211 lb 6.4 oz (95.9 kg)     CARDIAC STUDIES:  Cardiac Cath:  Conclusion    There is moderate aortic valve stenosis.  1st Diag lesion is 60% stenosed.  Mid LAD lesion is 50% stenosed.  Mid LAD to Dist LAD lesion is 75% stenosed.  Essentially normal right heart cath pressures with mildly elevated LVEDP.  Likely inaccurate measurement of aortic valve gradient estimated at 18 mmHg.   SUMMARY  Severe aortic stenosis by echocardiogram -> by cath, mean gradient 18 mmHg.  Severe vessel disease with 75 to 80% ISR in the mid LAD crossing 2nd Diag ? Otherwise no significant CAD.  Relatively normal Right Heart Cath Pressures and Cardiac Index/Output ? RAP 6 mmHg, RVP 23/0 mmHg-RVEDP 2 mmHg, ? PAP 33/13 mmHg-mean 17 mmHg; PCWP 20-25 mmHg ? Ao sat 90%, PA sat 75%; Cardiac Output-Index 10/15/20) 6.38-2.95   RECOMMENDATIONS  Case was discussed with Dr. 01-02-1974 and Excell Seltzer, PA from the  TAVR team.  Plan will be to discharge home set up follow-up with Dr. Cline Crock in the valve clinic to determine the appropriate course of action simultaneous versus staged PCI LAD and TAVR (potentially versus CABG/AVR)  Aortic Valve There is moderate aortic valve stenosis. By echo, read as severe with mean gradient of 37 mmHg. By cath measurement-mean gradient 18.15 mmHg (likely inaccurate)  Coronary Diagrams  Diagnostic Dominance: Left    Echo IMPRESSIONS    1. Left ventricular ejection fraction, by estimation, is 50 to 55%. The  left ventricle has low normal function. The left ventricle has no regional  wall motion abnormalities. There is severe concentric left ventricular  hypertrophy. Left ventricular  diastolic parameters are consistent with Grade I diastolic dysfunction  (impaired relaxation).  2. Abnormal left ventricular global longitudinal  strain ( -9.1 %).  3. Right ventricular systolic function is normal. The right ventricular  size is normal. There is normal pulmonary artery systolic pressure.  4. The mitral valve is normal in structure. Trivial mitral valve  regurgitation. No evidence of mitral stenosis.  5. The aortic valve thickened and heavily calcified. Aortic valve  regurgitation is not visualized. Severe low flow low gradient aortic  stenosis is present. AVA, by VTI measures 0.67 cm. Aortic valve mean  gradient 37.0 mmHg. Aortic valve Vmax 3.73 m/s.  SVI 29. DI 0.21  6. The inferior vena cava is normal in size with greater than 50%  respiratory variability, suggesting right atrial pressure of 3 mmHg.   LEFT VENTRICLE  PLAX 2D  LVIDd:     5.30 cm Diastology  LVIDs:     3.60 cm LV e' lateral:  4.57 cm/s  LV PW:     1.60 cm LV E/e' lateral: 11.9  LV IVS:    1.60 cm LV e' medial:  3.70 cm/s  LVOT diam:   2.00 cm LV E/e' medial: 14.7  LV SV:     62  LV SV Index:  29    2D Longitudinal Strain  LVOT Area:   3.14 cm 2D  Strain GLS Avg:   -9.1 %     RIGHT VENTRICLE      IVC  RV S prime:   9.03 cm/s IVC diam: 1.30 cm  TAPSE (M-mode): 2.0 cm   LEFT ATRIUM       Index    RIGHT ATRIUM      Index  LA diam:    5.00 cm 2.32 cm/m RA Area:   14.30 cm  LA Vol (A2C):  58.1 ml 26.98 ml/m RA Volume:  29.60 ml 13.74 ml/m  LA Vol (A4C):  64.9 ml 30.14 ml/m  LA Biplane Vol: 61.5 ml 28.56 ml/m  AORTIC VALVE  AV Area (Vmax):  0.66 cm  AV Area (Vmean):  0.56 cm  AV Area (VTI):   0.67 cm  AV Vmax:      373.00 cm/s  AV Vmean:     287.500 cm/s  AV VTI:      0.926 m  AV Peak Grad:   55.7 mmHg  AV Mean Grad:   37.0 mmHg  LVOT Vmax:     78.30 cm/s  LVOT Vmean:    51.500 cm/s  LVOT VTI:     0.197 m  LVOT/AV VTI ratio: 0.21    AORTA  Ao Root diam: 3.10 cm  Ao Asc diam: 3.40 cm   MITRAL VALVE        TRICUSPID VALVE  MV Area (PHT): 2.09 cm  TR Peak grad:  25.0 mmHg  MV Decel Time: 363 msec  TR Vmax:    250.00 cm/s  MV E velocity: 54.30 cm/s  MV A velocity: 74.70 cm/s SHUNTS  MV E/A ratio: 0.73    Systemic VTI: 0.20 m               Systemic Diam: 2.00 cm   STS RISK CALCULATOR: Isolated aortic valve replacement: Risk of Mortality: 2.768% Renal Failure: 3.795% Permanent Stroke: 2.507% Prolonged Ventilation: 6.620% DSW Infection: 0.103% Reoperation: 5.087% Morbidity or Mortality: 12.280% Short Length of Stay: 31.912% Long Length of Stay: 4.675%  ASSESSMENT AND PLAN: 1.  This is an 86 year old gentleman with severe, stage D1 aortic stenosis associated with New York Heart Association functional class III symptoms of progressive fatigue and shortness of breath.  I have  reviewed the natural history of aortic stenosis with the patient and their family members who are present today. We have discussed the limitations of medical therapy and the poor prognosis associated with symptomatic  aortic stenosis. We have reviewed potential treatment options, including palliative medical therapy, conventional surgical aortic valve replacement, and transcatheter aortic valve replacement. We discussed treatment options in the context of the patient's specific comorbid medical conditions.    The patient's echo images are personally reviewed.  He appears to have normal LV systolic function.  The aortic valve appears to be trileaflet with severe calcification and restriction of all 3 leaflets.  The patient's peak transaortic velocity is 3.86 m/s with a mean gradient of 41 mmHg, dimensionless index 0.2, and calculated aortic valve area 0.65 cm.  Cardiac catheterization results and images are also reviewed.  The mean transaortic gradient is much lower by cardiac catheterization at 18 mmHg.  Coronary angiography reveals patency of the right coronary artery, patency of the left circumflex which is a large vessel, and moderately severe diffuse in-stent restenosis throughout the mid LAD estimated at 75% as well as calcific proximal LAD stenosis estimated at 50 to 60%.  The patient's LAD stenosis is hemodynamically significant.  However, this elderly gentleman has no anginal chest pain.  We will review his case with our multidisciplinary heart team.  Options include medical therapy of CAD versus repeat PCI which would require long segment LAD stenting.  He will undergo a gated cardiac CTA as well as a CTA of the chest, abdomen, and pelvis.  This will help determine anatomic suitability for TAVR.  The patient is counseled about the TAVR procedure.  A procedural animation is demonstrated and all steps are reviewed.  We discussed potential complications.  Once his CTA studies are completed, he will be referred for formal cardiac surgical consultation as part of a multidisciplinary approach to his care.  Enzo Bi 09/06/2020 11:59 AM     [facility] [address] [place]  [place] [address]  [phone] (office) [phone] (fax)

## 2020-09-09 ENCOUNTER — Encounter: Payer: Self-pay | Admitting: Physician Assistant

## 2020-09-09 DIAGNOSIS — I2510 Atherosclerotic heart disease of native coronary artery without angina pectoris: Secondary | ICD-10-CM | POA: Insufficient documentation

## 2020-09-10 ENCOUNTER — Ambulatory Visit (INDEPENDENT_AMBULATORY_CARE_PROVIDER_SITE_OTHER): Payer: Medicare HMO

## 2020-09-10 DIAGNOSIS — I63412 Cerebral infarction due to embolism of left middle cerebral artery: Secondary | ICD-10-CM | POA: Diagnosis not present

## 2020-09-10 LAB — CUP PACEART REMOTE DEVICE CHECK
Date Time Interrogation Session: 20211002230344
Implantable Pulse Generator Implant Date: 20200921

## 2020-09-13 ENCOUNTER — Ambulatory Visit (HOSPITAL_COMMUNITY)
Admission: RE | Admit: 2020-09-13 | Discharge: 2020-09-13 | Disposition: A | Discharge status: Home / Self Care | Payer: Medicare HMO | Source: Ambulatory Visit | Attending: Cardiovascular Disease | Admitting: Cardiovascular Disease

## 2020-09-13 ENCOUNTER — Ambulatory Visit (HOSPITAL_BASED_OUTPATIENT_CLINIC_OR_DEPARTMENT_OTHER)
Admission: RE | Admit: 2020-09-13 | Discharge: 2020-09-13 | Disposition: A | Discharge status: Home / Self Care | Payer: Medicare HMO | Source: Ambulatory Visit | Attending: Cardiovascular Disease | Admitting: Cardiovascular Disease

## 2020-09-13 ENCOUNTER — Other Ambulatory Visit: Payer: Self-pay

## 2020-09-13 DIAGNOSIS — I350 Nonrheumatic aortic (valve) stenosis: Secondary | ICD-10-CM | POA: Insufficient documentation

## 2020-09-13 MED ORDER — IOHEXOL 350 MG/ML SOLN
100.0000 mL | Freq: Once | INTRAVENOUS | Status: AC | PRN
  Administered 2020-09-13: 100 mL via INTRAVENOUS

## 2020-09-13 NOTE — Progress Notes (Signed)
Carelink Summary Report / Loop Recorder 

## 2020-09-18 ENCOUNTER — Other Ambulatory Visit: Payer: Self-pay

## 2020-09-18 DIAGNOSIS — I350 Nonrheumatic aortic (valve) stenosis: Secondary | ICD-10-CM

## 2020-09-19 ENCOUNTER — Other Ambulatory Visit: Payer: Self-pay

## 2020-09-19 ENCOUNTER — Institutional Professional Consult (permissible substitution) (INDEPENDENT_AMBULATORY_CARE_PROVIDER_SITE_OTHER): Payer: Medicare HMO | Admitting: Surgery

## 2020-09-19 ENCOUNTER — Encounter: Payer: Self-pay | Admitting: Surgery

## 2020-09-19 VITALS — BP 120/72 | HR 76 | Temp 97.6°F | Resp 20 | Ht 71.0 in | Wt 211.0 lb

## 2020-09-19 DIAGNOSIS — I350 Nonrheumatic aortic (valve) stenosis: Secondary | ICD-10-CM

## 2020-09-19 NOTE — Progress Notes (Addendum)
Your procedure is scheduled on Tuesday, October 19th.  Report to [facility] Main Entrance "A" at 5:30 A.M., and check in at the Admitting office.  Call this number if you have problems the morning of surgery:  [phone]  Call [phone] if you have any questions prior to your surgery date Monday-Friday 8am-4pm   Remember:  Do not eat or drink after midnight the night before your surgery    Take these medicines the morning of surgery with A SIP OF WATER: NONE  Follow your surgeon's instructions on when to stop Aspirin and PLAVIX.  If no instructions were given by your surgeon then you will need to call the office to get those instructions.    As of today, STOP taking Aleve, Naproxen, Ibuprofen, Motrin, Advil, Goody's, BC's, all herbal medications, fish oil, and all vitamins.             Do not wear jewelry.            Do not wear lotions, powders, colognes, or deodorant.            Men may shave face and neck.            Do not bring valuables to the hospital.            [facility] is not responsible for any belongings or valuables.  Do NOT Smoke (Tobacco/Vaping) or drink Alcohol 24 hours prior to your procedure If you use a CPAP at night, you may bring all equipment for your overnight stay.   Contacts, glasses, dentures or bridgework may not be worn into surgery.      For patients admitted to the hospital, discharge time will be determined by your treatment team.   Patients discharged the day of surgery will not be allowed to drive home, and someone needs to stay with them for 24 hours.  Special instructions:   [place]- Preparing For Surgery  Before surgery, you can play an important role. Because skin is not sterile, your skin needs to be as free of germs as possible. You can reduce the number of germs on your skin by washing with CHG (chlorahexidine gluconate) Soap before surgery.  CHG is an antiseptic cleaner which kills germs and bonds with the skin to continue killing  germs even after washing.    Oral Hygiene is also important to reduce your risk of infection.  Remember - BRUSH YOUR TEETH THE MORNING OF SURGERY WITH YOUR REGULAR TOOTHPASTE  Please do not use if you have an allergy to CHG or antibacterial soaps. If your skin becomes reddened/irritated stop using the CHG.  Do not shave (including legs and underarms) for at least 48 hours prior to first CHG shower. It is OK to shave your face.  Please follow these instructions carefully.   1. Shower the NIGHT BEFORE SURGERY and the MORNING OF SURGERY with CHG Soap.   2. If you chose to wash your hair, wash your hair first as usual with your normal shampoo.  3. After you shampoo, rinse your hair and body thoroughly to remove the shampoo.  4. Use CHG as you would any other liquid soap. You can apply CHG directly to the skin and wash gently with a scrungie or a clean washcloth.   5. Apply the CHG Soap to your body ONLY FROM THE NECK DOWN.  Do not use on open wounds or open sores. Avoid contact with your eyes, ears, mouth and genitals (private parts). Wash Face and  genitals (private parts)  with your normal soap.   6. Wash thoroughly, paying special attention to the area where your surgery will be performed.  7. Thoroughly rinse your body with warm water from the neck down.  8. DO NOT shower/wash with your normal soap after using and rinsing off the CHG Soap.  9. Pat yourself dry with a CLEAN TOWEL.  10. Wear CLEAN PAJAMAS to bed the night before surgery  11. Place CLEAN SHEETS on your bed the night of your first shower and DO NOT SLEEP WITH PETS.  Day of Surgery: Wear Clean/Comfortable clothing the morning of surgery Do not apply any deodorants/lotions.   Remember to brush your teeth WITH YOUR REGULAR TOOTHPASTE.   Please read over the following fact sheets that you were given.

## 2020-09-19 NOTE — Progress Notes (Signed)
Patient ID: Michael Kidd, male   DOB: Nov 10, 1930, 86 y.o.   MRN: [id number]  [facility] CONSULTATION REPORT  Referring Provider is Park Liter, MD Primary Cardiologist is No primary care provider on file. PCP is Jene Every, MD  Chief Complaint  Patient presents with  . Aortic Stenosis    Surgical consult for TAVR, review all testing    HPI:  The patient is an 86 year old gentleman with a history of hypertension, coronary artery disease status post remote PCI of the LAD, stroke last year with expressive aphasia that completely resolved, and aortic stenosis.  He had an echocardiogram 07/16/2019 at the time of his stroke which showed a trileaflet aortic valve with moderate thickening and calcification.  The mean gradient was 24 mmHg with peak gradient of 35 mmHg.  Aortic valve area was 0.79 cm.  Left ventricular ejection fraction was 55 to 60%.  There was no sign of intracardiac thrombus.  He was recently seen back by Dr. Agustin Cree and in August 2021 reporting worsening exertional dyspnea and fatigue.  Repeat echocardiogram on 08/05/2020 showed an increase in the mean aortic valve gradient to 37 mmHg with a peak gradient of 56 mmHg.  Aortic valve area was 0.67 cm.  Left ventricular ejection fraction was 50 to 55%.  He reports shortness of breath and fatigue with any walking or doing yard work.  He has been riding his lawnmower but still gets tired doing that.  He denies any chest pressure or pain.  He has had no orthopnea or PND.  Denies peripheral edema.  He denies dizziness and syncope.  He is here today with his granddaughter.  He lives with his wife at home.  They both have some memory issues but has been independent.  Past Medical History:  Diagnosis Date  . Arthritis   . Bilateral chronic knee pain   . BPH (benign prostatic hyperplasia)   . CAD (coronary artery disease)    s/p remote LAD PCI  .  Dysthymia   . HTN (hypertension)   . Renal cyst     Past Surgical History:  Procedure Laterality Date  . LOOP RECORDER INSERTION N/A 08/28/2019   Procedure: LOOP RECORDER INSERTION;  Surgeon: Evans Lance, MD;  Location: [facility];  Service: Cardiovascular;  Laterality: N/A;  . PERCUTANEOUS CORONARY STENT INTERVENTION (PCI-S)    . RIGHT/LEFT HEART CATH AND CORONARY ANGIOGRAPHY N/A 08/22/2020   Procedure: RIGHT/LEFT HEART CATH AND CORONARY ANGIOGRAPHY;  Surgeon: Leonie Man, MD;  Location: [facility];  Service: Cardiovascular;  Laterality: N/A;    Family History  Problem Relation Age of Onset  . Diabetes Mother   . Stroke Father     Social History   Socioeconomic History  . Marital status: Married    Spouse name: Not on file  . Number of children: Not on file  . Years of education: Not on file  . Highest education level: Not on file  Occupational History  . Not on file  Tobacco Use  . Smoking status: Former Research scientist (life sciences)  . Smokeless tobacco: Never Used  Vaping Use  . Vaping Use: Never used  Substance and Sexual Activity  . Alcohol use: Not Currently  . Drug use: Never  . Sexual activity: Not on file  Other Topics Concern  . Not on file  Social History Narrative  . Not on file   Social Determinants of Health  Financial Resource Strain:   . Difficulty of Paying Living Expenses: Not on file  Food Insecurity:   . Worried About Charity fundraiser in the Last Year: Not on file  . Ran Out of Food in the Last Year: Not on file  Transportation Needs:   . Lack of Transportation (Medical): Not on file  . Lack of Transportation (Non-Medical): Not on file  Physical Activity:   . Days of Exercise per Week: Not on file  . Minutes of Exercise per Session: Not on file  Stress:   . Feeling of Stress : Not on file  Social Connections:   . Frequency of Communication with Friends and Family: Not on file  . Frequency of Social Gatherings with Friends and  Family: Not on file  . Attends Religious Services: Not on file  . Active Member of Clubs or Organizations: Not on file  . Attends Archivist Meetings: Not on file  . Marital Status: Not on file  Intimate Partner Violence:   . Fear of Current or Ex-Partner: Not on file  . Emotionally Abused: Not on file  . Physically Abused: Not on file  . Sexually Abused: Not on file    Current Outpatient Medications  Medication Sig Dispense Refill  . acetaminophen (TYLENOL) 650 MG CR tablet Take 325 mg by mouth in the morning and at bedtime.     Marland Kitchen albuterol (VENTOLIN HFA) 108 (90 Base) MCG/ACT inhaler Inhale 2 puffs into the lungs every 6 (six) hours as needed.     Marland Kitchen atorvastatin (LIPITOR) 40 MG tablet Take 1 tablet (40 mg total) by mouth daily at 6 PM. (Patient taking differently: Take 20 mg by mouth daily at 6 PM. ) 30 tablet 11  . calcium carbonate (TUMS - DOSED IN MG ELEMENTAL CALCIUM) 500 MG chewable tablet Chew 1-2 tablets by mouth 3 (three) times daily as needed for indigestion or heartburn.    . clopidogrel (PLAVIX) 75 MG tablet Take 1 tablet (75 mg total) by mouth daily. 30 tablet 11  . finasteride (PROSCAR) 5 MG tablet Take 5 mg by mouth every evening.     . fluticasone (FLONASE) 50 MCG/ACT nasal spray Place 2 sprays into both nostrils daily as needed for allergies or rhinitis.    Marland Kitchen lisinopril (ZESTRIL) 5 MG tablet Take 5 mg by mouth daily.    Marland Kitchen loperamide (IMODIUM) 2 MG capsule Take 2-4 mg by mouth 4 (four) times daily as needed for diarrhea or loose stools.    . metoprolol tartrate (LOPRESSOR) 25 MG tablet Take 12.5 mg by mouth 2 (two) times daily.    . Naphazoline-Glycerin (CLEAR EYES MAX REDNESS RELIEF) 0.03-0.5 % SOLN Place 1-2 drops into both eyes 3 (three) times daily as needed (redness relief).    Marland Kitchen omeprazole (PRILOSEC) 20 MG capsule Take 20 mg by mouth daily.     . tamsulosin (FLOMAX) 0.4 MG CAPS capsule Take 0.4 mg by mouth every evening.      No current  facility-administered medications for this visit.    No Known Allergies    Review of Systems:   General:  normal appetite, + decreased energy, no weight gain, no weight loss, no fever  Cardiac:  no chest pain with exertion, no chest pain at rest, +SOB with mild exertion, no resting SOB, no PND, no orthopnea, no palpitations, no arrhythmia, no atrial fibrillation, no LE edema, no dizzy spells, no syncope  Respiratory:  + exertional shortness of breath, no home oxygen, no  productive cough, no dry cough, no bronchitis, no wheezing, no hemoptysis, no asthma, no pain with inspiration or cough, no sleep apnea, no CPAP at night  GI:   no difficulty swallowing, no reflux, no frequent heartburn, no hiatal hernia, no abdominal pain, no constipation, no diarrhea, no hematochezia, no hematemesis, no melena  GU:   no dysuria,  no frequency, no urinary tract infection, no hematuria, no enlarged prostate, no kidney stones, no kidney disease  Vascular:  no pain suggestive of claudication, no pain in feet, no leg cramps, no varicose veins, no DVT, no non-healing foot ulcer  Neuro:   + stroke last year, no TIA's, no seizures, no headaches, no temporary blindness one eye,  no slurred speech, no peripheral neuropathy, no chronic pain, no instability of gait, + memory/cognitive dysfunction  Musculoskeletal: + arthritis, no joint swelling, no myalgias, no difficulty walking, normal mobility   Skin:   no rash, no itching, no skin infections, no pressure sores or ulcerations  Psych:   no anxiety, no depression, no nervousness, no unusual recent stress  Eyes:   no blurry vision, no floaters, no recent vision changes, + wears glasses or contacts  ENT:   no hearing loss, no loose or painful teeth, + dentures Hematologic:  no easy bruising, no abnormal bleeding, no clotting disorder, no frequent epistaxis  Endocrine:  no diabetes, does not check CBG's at home   Physical Exam:   BP 120/72   Pulse 76   Temp 97.6 F  (36.4 C) (Skin)   Resp 20   Ht 5' 11"  (1.803 m)   Wt 211 lb (95.7 kg)   SpO2 95% Comment: RA  BMI 29.43 kg/m   General:  Elderly but  well-appearing  HEENT:  Unremarkable   Neck:   no JVD, no bruits, no adenopathy or thyromegaly  Chest:   clear to auscultation, symmetrical breath sounds, no wheezes, no rhonchi   CV:   RRR, grade lll/VI crescendo/decrescendo murmur heard best at RSB,  no diastolic murmur  Abdomen:  soft, non-tender, no masses   Extremities:  warm, well-perfused, pulses palpable at ankle, no LE edema  Rectal/GU  Deferred  Neuro:   Grossly non-focal and symmetrical throughout  Skin:   Clean and dry, no rashes, no breakdown   Diagnostic Tests:  Patient Name:  OSHAY STRANAHAN Date of Exam: 08/05/2020  Medical Rec #: [id number]  Height:    71.0 in  Accession #:  [id number] Weight:    210.2 lb  Date of Birth: 1930-09-11 BSA:     2.154 m  Patient Age:  42 years  BP:      122/68 mmHg  Patient Gender: M      HR:      66 bpm.  Exam Location: [facility]   Procedure: 2D Echo   Indications:  CAD Native Vessel 414.01 / I25.10    History:    Patient has prior history of Echocardiogram examinations,  most         recent 08/27/2019. Stroke; Risk Factors:Hypertension and         Dyslipidemia.    Sonographer:  Luane School  Referring Phys: [place]    1. Left ventricular ejection fraction, by estimation, is 50 to 55%. The  left ventricle has low normal function. The left ventricle has no regional  wall motion abnormalities. There is severe concentric left ventricular  hypertrophy. Left ventricular  diastolic parameters are consistent with Grade I diastolic dysfunction  (impaired  relaxation).  2. Abnormal left ventricular global longitudinal strain ( -9.1 %).  3. Right ventricular systolic function is normal. The right ventricular  size is normal. There is normal pulmonary  artery systolic pressure.  4. The mitral valve is normal in structure. Trivial mitral valve  regurgitation. No evidence of mitral stenosis.  5. The aortic valve thickened and heavily calcified. Aortic valve  regurgitation is not visualized. Severe low flow low gradient aortic  stenosis is present. AVA, by VTI measures 0.67 cm. Aortic valve mean  gradient 37.0 mmHg. Aortic valve Vmax 3.73 m/s.  SVI 29. DI 0.21  6. The inferior vena cava is normal in size with greater than 50%  respiratory variability, suggesting right atrial pressure of 3 mmHg.   FINDINGS  Left Ventricle: Left ventricular ejection fraction, by estimation, is 50  to 55%. The left ventricle has low normal function. The left ventricle has  no regional wall motion abnormalities. The average left ventricular global  longitudinal strain is -9.1 %.  The left ventricular internal cavity size was normal in size. There is  severe concentric left ventricular hypertrophy. Left ventricular diastolic  parameters are consistent with Grade I diastolic dysfunction (impaired  relaxation).   Right Ventricle: The right ventricular size is normal. No increase in  right ventricular wall thickness. Right ventricular systolic function is  normal. There is normal pulmonary artery systolic pressure. The tricuspid  regurgitant velocity is 2.50 m/s, and  with an assumed right atrial pressure of 3 mmHg, the estimated right  ventricular systolic pressure is 41.2 mmHg.   Left Atrium: Left atrial size was normal in size.   Right Atrium: Right atrial size was normal in size.   Pericardium: There is no evidence of pericardial effusion.   Mitral Valve: The mitral valve is normal in structure. Normal mobility of  the mitral valve leaflets. Trivial mitral valve regurgitation. No evidence  of mitral valve stenosis.   Tricuspid Valve: The tricuspid valve is normal in structure. Tricuspid  valve regurgitation is mild . No evidence of tricuspid  stenosis.   Aortic Valve: The aortic valve is abnormal. . There is moderate thickening  and severe calcifcation of the aortic valve. Aortic valve regurgitation is  not visualized. Severe aortic stenosis is present. Moderate aortic valve  annular calcification. There is  moderate thickening of the aortic valve. There is severe calcifcation of  the aortic valve. Aortic valve mean gradient measures 37.0 mmHg. Aortic  valve peak gradient measures 55.7 mmHg. Aortic valve area, by VTI measures  0.67 cm.   Pulmonic Valve: The pulmonic valve was normal in structure. Pulmonic valve  regurgitation is not visualized. No evidence of pulmonic stenosis.   Aorta: The aortic root is normal in size and structure.   Venous: The inferior vena cava is normal in size with greater than 50%  respiratory variability, suggesting right atrial pressure of 3 mmHg.   IAS/Shunts: No atrial level shunt detected by color flow Doppler.     LEFT VENTRICLE  PLAX 2D  LVIDd:     5.30 cm Diastology  LVIDs:     3.60 cm LV e' lateral:  4.57 cm/s  LV PW:     1.60 cm LV E/e' lateral: 11.9  LV IVS:    1.60 cm LV e' medial:  3.70 cm/s  LVOT diam:   2.00 cm LV E/e' medial: 14.7  LV SV:     62  LV SV Index:  29    2D Longitudinal Strain  LVOT Area:  3.14 cm 2D Strain GLS Avg:   -9.1 %     RIGHT VENTRICLE      IVC  RV S prime:   9.03 cm/s IVC diam: 1.30 cm  TAPSE (M-mode): 2.0 cm   LEFT ATRIUM       Index    RIGHT ATRIUM      Index  LA diam:    5.00 cm 2.32 cm/m RA Area:   14.30 cm  LA Vol (A2C):  58.1 ml 26.98 ml/m RA Volume:  29.60 ml 13.74 ml/m  LA Vol (A4C):  64.9 ml 30.14 ml/m  LA Biplane Vol: 61.5 ml 28.56 ml/m  AORTIC VALVE  AV Area (Vmax):  0.66 cm  AV Area (Vmean):  0.56 cm  AV Area (VTI):   0.67 cm  AV Vmax:      373.00 cm/s  AV Vmean:     287.500 cm/s  AV VTI:      0.926 m  AV Peak Grad:    55.7 mmHg  AV Mean Grad:   37.0 mmHg  LVOT Vmax:     78.30 cm/s  LVOT Vmean:    51.500 cm/s  LVOT VTI:     0.197 m  LVOT/AV VTI ratio: 0.21    AORTA  Ao Root diam: 3.10 cm  Ao Asc diam: 3.40 cm   MITRAL VALVE        TRICUSPID VALVE  MV Area (PHT): 2.09 cm  TR Peak grad:  25.0 mmHg  MV Decel Time: 363 msec  TR Vmax:    250.00 cm/s  MV E velocity: 54.30 cm/s  MV A velocity: 74.70 cm/s SHUNTS  MV E/A ratio: 0.73    Systemic VTI: 0.20 m               Systemic Diam: 2.00 cm   Godfrey Pick Tobb DO  Electronically signed by Berniece Salines DO  Signature Date/Time: 08/05/2020/5:52:13 PM     Physicians  Panel Physicians Referring Physician Case Authorizing Physician  Leonie Man, MD (Primary)    Procedures  RIGHT/LEFT HEART CATH AND CORONARY ANGIOGRAPHY  Conclusion    There is moderate aortic valve stenosis.  1st Diag lesion is 60% stenosed.  Mid LAD lesion is 50% stenosed.  Mid LAD to Dist LAD lesion is 75% stenosed.  Essentially normal right heart cath pressures with mildly elevated LVEDP.  Likely inaccurate measurement of aortic valve gradient estimated at 18 mmHg.   SUMMARY  Severe aortic stenosis by echocardiogram -> by cath, mean gradient 18 mmHg.  Severe vessel disease with 75 to 80% ISR in the mid LAD crossing 2nd Diag ? Otherwise no significant CAD.  Relatively normal Right Heart Cath Pressures and Cardiac Index/Output ? RAP 6 mmHg, RVP 23/0 mmHg-RVEDP 2 mmHg, ? PAP 33/13 mmHg-mean 17 mmHg; PCWP 20-25 mmHg ? Ao sat 90%, PA sat 75%; Cardiac Output-Index Kathlen Brunswick) 6.38-2.95   RECOMMENDATIONS  Case was discussed with Dr. Burt Knack and Angelena Form, PA from the TAVR team.  Plan will be to discharge home set up follow-up with Dr. Burt Knack in the valve clinic to determine the appropriate course of action simultaneous versus staged PCI LAD and TAVR (potentially versus CABG/AVR)   Glenetta Hew,  MD   Recommendations  Antiplatelet/Anticoag Recommend dual antiplatelet therapy with Aspirin 25m daily and Clopidogrel 74mdaily long-term (beyond 12 months) because of Patient will likely require LAD PCI as well as TAVR.  Discharge Date In the absence of any other complications or medical issues, we expect the patient  to be ready for discharge from a cath perspective on 08/22/2020. He will be set up for an appointment to see Dr. Burt Knack in the valve clinic at the first available date.  Surgeon Notes    08/22/2020 1:01 PM Brief Op Note addendum by Leonie Man, MD  Indications  Symptomatic severe aortic stenosis with normal ejection fraction [I35.0 (ICD-10-CM)]  CAD S/P percutaneous coronary angioplasty [I25.10, Z98.61 (ICD-10-CM)]  Procedural Details  Technical Details Primary CARE Physician: Jene Every MD Cardiologist: Dr. Dorothy Spark  86 y.o. male with distant history of CAD & newly diagnosed Severe Aortic Stenosis by Echo. He is symptomatic & is now referred for right and left heart catheterization as part of pre-AVR evaluation.  Time Out: Verified patient identification, verified procedure, site/side was marked, verified correct patient position, special equipment/implants available, medications/allergies/relevent history reviewed, required imaging and test results available. Performed.  Access:  Right Radial Artery: 6 Fr sheath -- Seldinger technique using Micropuncture Kit Direct ultrasound guidance used.  Permanent image obtained and placed on chart. 10 mL radial cocktail IA; 5000 units IV Heparin * Right Brachial/Antecubital Vein: The existing 18-gauge IV was exchanged over a wire for a 5Fr sheath  Right Heart Catheterization: 5 Fr Gordy Councilman catheter advanced under fluoroscopy with balloon inflated to the RA, RV, then PCWP-PA for hemodynamic measurement.  * Simultaneous FA & PA blood gases checked for SaO2% to calculate FICK CO/CI  * Catheter removed  completely out of the body with balloon deflated.  Left Heart Catheterization: 5 Fr TIG 4.0 catheter was advanced or exchanged over a J-wire under direct fluoroscopic guidance into the ascending aorta;   * LV Hemodynamics (no LV Gram): Take 4.0 catheter-straight wire * Left and right Coronary Artery Cineangiography: TIG 4.0 catheter   Upon completion of Angiogaphy, the catheter was removed completely out of the body over a wire, without complication.  Brachial Sheath(s) removed in the PACU holding area with manual pressure for hemostasis.    Radial sheath removed in the Cardiac Catheterization lab with TR Band placed for hemostasis.  TR Band: 1000  Hours; 16 mL air  MEDICATIONS * SQ Lidocaine 74m * Radial Cocktail: 3 mg Verapmil in 10 mL NS * Isovue Contrast: 50 mL * Heparin: 5000 units Estimated blood loss <50 mL.   During this procedure medications were administered to achieve and maintain moderate conscious sedation while the patient's heart rate, blood pressure, and oxygen saturation were continuously monitored and I was present face-to-face 100% of this time.  Medications (Filter: Administrations occurring from [phone] 1020 on 08/22/20) fentaNYL (SUBLIMAZE) injection (mcg) Total dose:  25 mcg Date/Time  Rate/Dose/Volume Action  08/22/20 0918  25 mcg Given    midazolam (VERSED) injection (mg) Total dose:  1 mg Date/Time  Rate/Dose/Volume Action  08/22/20 0919  1 mg Given    Radial Cocktail/Verapamil only (mL) Total volume:  10 mL Date/Time  Rate/Dose/Volume Action  08/22/20 0937  10 mL Given    heparin sodium (porcine) injection (Units) Total dose:  5,000 Units Date/Time  Rate/Dose/Volume Action  08/22/20 0946  5,000 Units Given    iohexol (OMNIPAQUE) 350 MG/ML injection (mL) Total volume:  50 mL Date/Time  Rate/Dose/Volume Action  08/22/20 1000  50 mL Given    Heparin (Porcine) in NaCl 1000-0.9 UT/500ML-% SOLN (mL) Total volume:  1,000 mL Date/Time   Rate/Dose/Volume Action  08/22/20 1001  1,000 mL Given    Sedation Time  Sedation Time Physician-1: 38 minutes 58 seconds  Contrast  Medication Name Total Dose  iohexol (OMNIPAQUE) 350 MG/ML injection 50 mL    Radiation/Fluoro  Fluoro time: 5.2 (min) DAP: 21390 (mGycm2) Cumulative Air Kerma: 563.8 (mGy)  Complications  Complications documented before study signed (08/22/2020 7:56 PM)   No complications were associated with this study.  Documented by Leonie Man, MD - 08/22/2020 2:20 PM    Coronary Findings  Diagnostic Dominance: Left Left Main  Vessel was injected. Vessel is large. The vessel is mildly calcified.  Left Anterior Descending  Vessel is small.  Ost LAD to Mid LAD lesion is 15% stenosed. The lesion is concentric. The lesion is severely calcified.  Mid LAD lesion is 50% stenosed. The lesion is eccentric and irregular.  Mid LAD to Dist LAD lesion is 75% stenosed. The lesion is segmental. The lesion was previously treated using a stent (unknown type) over 2 years ago. Previously placed stent displays restenosis.  First Diagonal Branch  Vessel is small in size.  1st Diag lesion is 60% stenosed. The lesion is focal.  First Septal Branch  Vessel is small in size.  Second Diagonal Branch  Vessel is small in size.  Left Anterior Descending  Vessel is small.  Ost LAD to Mid LAD lesion is 15% stenosed. The lesion is concentric. The lesion is severely calcified.  Mid LAD lesion is 50% stenosed. The lesion is eccentric and irregular.  Mid LAD to Dist LAD lesion is 75% stenosed. The lesion is segmental. The lesion was previously treated using a stent (unknown type) over 2 years ago. Previously placed stent displays restenosis.  First Diagonal Branch  Vessel is small in size.  1st Diag lesion is 60% stenosed. The lesion is focal.  First Septal Branch  Vessel is small in size.  Second Diagonal Branch  Vessel is small in size.  Ramus Intermedius  Vessel is  normal in caliber. Normal to large in size The vessel is tortuous.  Left Circumflex  Vessel is large.  First Obtuse Marginal Branch  Vessel is small in size. The vessel is tortuous.  Second Obtuse Marginal Branch  The vessel is tortuous.  Left Posterior Descending Artery  Vessel is large in size.  Right Coronary Artery  Vessel is small. Vessel is angiographically normal.  Right Ventricular Branch  Vessel is small in size.  Intervention  No interventions have been documented. Right Heart  Right Heart Pressures PAP 33/13 mmHg-mean 17 mmHg; PCWP 20-25 mmHg Elevated LV EDP consistent with volume overload. LVP-EDP: 199/7 mmHg - 18 mmHg AoP-R MAP: 174/79 mmHg; 118 mmHg  Right Atrium Borderline elevated: RA mean 6-7 mmHg  Right Ventricle RVP-EBV 23/0 mmHg-1 mmHg  Wall Motion  Resting    No LV Gram        Left Heart  Aortic Valve There is moderate aortic valve stenosis. By echo, read as severe with mean gradient of 37 mmHg. By cath measurement-mean gradient 18.15 mmHg (likely inaccurate)  Coronary Diagrams  Diagnostic Dominance: Left  Intervention  Implants   No implant documentation for this case.  Syngo Images  Show images for CARDIAC CATHETERIZATION Images on Long Term Storage  Show images for Levi, Klaiber to Procedure Log  Procedure Log    Hemo Data   Most Recent Value  Fick Cardiac Output 6.38 L/min  Fick Cardiac Output Index 2.95 (L/min)/BSA  Aortic Mean Gradient 18.15 mmHg  Aortic Peak Gradient 24 mmHg  Aortic Valve Area 1.61  Aortic Value Area Index 0.75 cm2/BSA  RA A Wave  9 mmHg  RA V Wave 7 mmHg  RA Mean 6 mmHg  RV Systolic Pressure 23 mmHg  RV Diastolic Pressure 0 mmHg  RV EDP 0 mmHg  PA Systolic Pressure 33 mmHg  PA Diastolic Pressure 13 mmHg  PA Mean 17 mmHg  PW A Wave 25 mmHg  PW V Wave 23 mmHg  PW Mean 18 mmHg  AO Systolic Pressure 725 mmHg  AO Diastolic Pressure 71 mmHg  AO Mean 366 mmHg  LV Systolic Pressure 440 mmHg  LV  Diastolic Pressure 7 mmHg  LV EDP 18 mmHg  AOp Systolic Pressure 347 mmHg  AOp Diastolic Pressure 79 mmHg  AOp Mean Pressure 425 mmHg  LVp Systolic Pressure 956 mmHg  LVp Diastolic Pressure 8 mmHg  LVp EDP Pressure 16 mmHg  QP/QS 1  TPVR Index 5.75 HRUI  TSVR Index 34.88 HRUI  TPVR/TSVR Ratio 0.17    ADDENDUM REPORT: 09/15/2020 17:53  CLINICAL DATA:  86 year old male with severe aortic stenosis being evaluated for a TAVR procedure.  EXAM: Cardiac TAVR CT  TECHNIQUE: The patient was scanned on a [employer]. A 120 kV retrospective scan was triggered in the descending thoracic aorta at 111 HU's. Gantry rotation speed was 250 msecs and collimation was .6 mm. No beta blockade or nitro were given. The 3D data set was reconstructed in 5% intervals of the R-R cycle. Systolic and diastolic phases were analyzed on a dedicated work station using MPR, MIP and VRT modes. The patient received 80 cc of contrast.  FINDINGS: Aortic Valve: Trileaflet aortic valve with severely calcified leaflets and severely restricted leaflet opening. Only trivial calcifications are extending into the LVOT. Aortic valve calcium score is 2667 consistent with severe aortic stenosis.  Aorta: Normal size with mild diffuse atherosclerotic plaque and calcifications and no dissection.  Sinotubular Junction: 29 x 28 mm  Ascending Thoracic Aorta: 32 x 32 mm  Aortic Arch: 28 x 26 mm  Descending Thoracic Aorta: 27 x 27 mm  Sinus of Valsalva Measurements:  Non-coronary: 34 mm  Right -coronary: 34 mm  Left -coronary: 33 mm  Coronary Artery Height above Annulus:  Left Main: 17 mm  Right Coronary: 18 mm  Virtual Basal Annulus Measurements: (Measured at 20%)  Maximum/Minimum Diameter: 27.8 x 23.9 mm  Mean Diameter: 25.4 mm  Perimeter: 81.5 mm  Area: 507 mm2  Optimum Fluoroscopic Angle for Delivery:  RAO 12 CRA 5  IMPRESSION: 1. Trileaflet aortic valve with  severely calcified leaflets and severely restricted leaflet opening. Only trivial calcifications are extending into the LVOT. Aortic valve calcium score is 2667 consistent with severe aortic stenosis. Annular measurements suitable for delivery of a 26 mm Edwards-SAPIEN 3 Ultra valve.  2. Sufficient coronary to annulus distance.  3. Optimum Fluoroscopic Angle for Delivery: RAO 12 CRA 5  4. No thrombus in the left atrial appendage.   Electronically Signed   By: Ena Dawley   On: 09/15/2020 17:53   Addended by Dorothy Spark, MD on 09/15/2020 5:56 PM  Study Result  Addenda  ADDENDUM REPORT: 09/15/2020 17:53  CLINICAL DATA:  86 year old male with severe aortic stenosis being evaluated for a TAVR procedure.  EXAM: Cardiac TAVR CT  TECHNIQUE: The patient was scanned on a [employer]. A 120 kV retrospective scan was triggered in the descending thoracic aorta at 111 HU's. Gantry rotation speed was 250 msecs and collimation was .6 mm. No beta blockade or nitro were given. The 3D data set was reconstructed in 5% intervals  of the R-R cycle. Systolic and diastolic phases were analyzed on a dedicated work station using MPR, MIP and VRT modes. The patient received 80 cc of contrast.  FINDINGS: Aortic Valve: Trileaflet aortic valve with severely calcified leaflets and severely restricted leaflet opening. Only trivial calcifications are extending into the LVOT. Aortic valve calcium score is 2667 consistent with severe aortic stenosis.  Aorta: Normal size with mild diffuse atherosclerotic plaque and calcifications and no dissection.  Sinotubular Junction: 29 x 28 mm  Ascending Thoracic Aorta: 32 x 32 mm  Aortic Arch: 28 x 26 mm  Descending Thoracic Aorta: 27 x 27 mm  Sinus of Valsalva Measurements:  Non-coronary: 34 mm  Right -coronary: 34 mm  Left -coronary: 33 mm  Coronary Artery Height above Annulus:  Left Main: 17  mm  Right Coronary: 18 mm  Virtual Basal Annulus Measurements: (Measured at 20%)  Maximum/Minimum Diameter: 27.8 x 23.9 mm  Mean Diameter: 25.4 mm  Perimeter: 81.5 mm  Area: 507 mm2  Optimum Fluoroscopic Angle for Delivery:  RAO 12 CRA 5  IMPRESSION: 1. Trileaflet aortic valve with severely calcified leaflets and severely restricted leaflet opening. Only trivial calcifications are extending into the LVOT. Aortic valve calcium score is 2667 consistent with severe aortic stenosis. Annular measurements suitable for delivery of a 26 mm Edwards-SAPIEN 3 Ultra valve.  2. Sufficient coronary to annulus distance.  3. Optimum Fluoroscopic Angle for Delivery: RAO 12 CRA 5  4. No thrombus in the left atrial appendage.   Electronically Signed   By: Ena Dawley   On: 09/15/2020 17:53   Signed by Dorothy Spark, MD on 09/15/2020 5:56 PM  Narrative & Impression  EXAM: OVER-READ INTERPRETATION  CT CHEST  The following report is an over-read performed by radiologist Dr. Vinnie Langton of [facility], [place] on 09/13/2020. This over-read does not include interpretation of cardiac or coronary anatomy or pathology. The coronary calcium score/coronary CTA interpretation by the cardiologist is attached.  COMPARISON:  None.  FINDINGS: Extracardiac findings will be described separately under dictation for contemporaneously obtained CTA chest, abdomen and pelvis.  IMPRESSION: Please see separate dictation for contemporaneously obtained CTA chest, abdomen and pelvis dated 09/13/2020 for full description of relevant extracardiac findings.  Electronically Signed: By: Vinnie Langton M.D. On: 09/13/2020 10:27     CLINICAL DATA:  86 year old male with history of severe aortic stenosis. Preprocedural study prior to potential transcatheter aortic valve replacement (TAVR) procedure.  EXAM: CT ANGIOGRAPHY CHEST, ABDOMEN AND  PELVIS  TECHNIQUE: Non-contrast CT of the chest was initially obtained.  Multidetector CT imaging through the chest, abdomen and pelvis was performed using the standard protocol during bolus administration of intravenous contrast. Multiplanar reconstructed images and MIPs were obtained and reviewed to evaluate the vascular anatomy.  CONTRAST:  134m OMNIPAQUE IOHEXOL 350 MG/ML SOLN  COMPARISON:  None.  FINDINGS: CTA CHEST FINDINGS  Cardiovascular: Heart size is mildly enlarged with left atrial dilatation. There is no significant pericardial fluid, thickening or pericardial calcification. There is aortic atherosclerosis, as well as atherosclerosis of the great vessels of the mediastinum and the coronary arteries, including calcified atherosclerotic plaque in the left main, left anterior descending, left circumflex and right coronary arteries. Severe thickening and calcification of the aortic valve.  Mediastinum/Lymph Nodes: No pathologically enlarged mediastinal or hilar lymph nodes. Esophagus is unremarkable in appearance. No axillary lymphadenopathy.  Lungs/Pleura: Mild elevation of the right hemidiaphragm. No acute consolidative airspace disease. No pleural effusions. No suspicious appearing pulmonary nodules or masses are  noted.  Musculoskeletal/Soft Tissues: Multiple Schmorl's nodes in the thoracic spine incidentally noted. There are no aggressive appearing lytic or blastic lesions noted in the visualized portions of the skeleton.  CTA ABDOMEN AND PELVIS FINDINGS  Hepatobiliary: No suspicious cystic or solid hepatic lesions. No intra or extrahepatic biliary ductal dilatation. Gallbladder is normal in appearance.  Pancreas: No pancreatic mass. No pancreatic ductal dilatation. No pancreatic or peripancreatic fluid collections or inflammatory changes.  Spleen: Unremarkable.  Adrenals/Urinary Tract: Multiple low-attenuation lesions are noted in both  kidneys, compatible with simple cysts, largest of which measure up to 2.9 cm. Other subcentimeter low-attenuation lesions are noted in both kidneys, too small to characterize, but statistically likely to represent cysts. Mild bilateral renal atrophy. Bilateral adrenal glands are normal in appearance. No hydroureteronephrosis. Urinary bladder is normal in appearance.  Stomach/Bowel: Normal appearance of the stomach. No pathologic dilatation of small bowel or colon. Numerous colonic diverticulae are noted, without surrounding inflammatory changes to suggest an acute diverticulitis at this time. Normal appendix.  Vascular/Lymphatic: Aortic atherosclerosis, without evidence of aneurysm or dissection in the abdominal or pelvic vasculature. Vascular findings and measurements pertinent to potential TAVR procedure, as detailed below. No lymphadenopathy noted in the abdomen or pelvis.  Reproductive: Prostate gland and seminal vesicles are unremarkable in appearance.  Other: No significant volume of ascites.  No pneumoperitoneum.  Musculoskeletal: There are no aggressive appearing lytic or blastic lesions noted in the visualized portions of the skeleton.  VASCULAR MEASUREMENTS PERTINENT TO TAVR:  AORTA:  Minimal Aortic Diameter-17 x 15 mm  Severity of Aortic Calcification-moderate to severe  RIGHT PELVIS:  Right Common Iliac Artery -  Minimal Diameter-8.4 x 9.0 mm  Tortuosity-mild-to-moderate  Calcification-moderate  Right External Iliac Artery -  Minimal Diameter-8.7 x 8.1 mm  Tortuosity-mild  Calcification-mild  Right Common Femoral Artery -  Minimal Diameter-8.2 x 7.1 mm  Tortuosity-mild  Calcification-mild-to-moderate  LEFT PELVIS:  Left Common Iliac Artery -  Minimal Diameter-9.9 x 11.2 mm  Tortuosity-mild  Calcification-moderate  Left External Iliac Artery -  Minimal Diameter-9.4 x 8.9  mm  Tortuosity-mild  Calcification-none  Left Common Femoral Artery -  Minimal Diameter-8.2 x 8.6 mm  Tortuosity-mild  Calcification-mild-to-moderate  Review of the MIP images confirms the above findings.  IMPRESSION: 1. Vascular findings and measurements pertinent to potential TAVR procedure, as detailed above. 2. Severe thickening calcification of the aortic valve, compatible with reported clinical history of severe aortic stenosis. 3. Mild cardiomegaly with left atrial dilatation. 4. Aortic atherosclerosis, in addition to left main and 3 vessel coronary artery disease. 5. Mild elevation of the right hemidiaphragm. 6. Mild bilateral renal atrophy. 7. Colonic diverticulosis without evidence of acute diverticulitis at this time. 8. Additional incidental findings, as above.   Electronically Signed   By: Vinnie Langton M.D.   On: 09/13/2020 14:03   STS RISK CALCULATOR: Isolated aortic valve replacement: Risk of Mortality: 2.768% Renal Failure: 3.795% Permanent Stroke: 2.507% Prolonged Ventilation: 6.620% DSW Infection: 0.103% Reoperation: 5.087% Morbidity or Mortality: 12.280% Short Length of Stay: 31.912% Long Length of Stay: 4.675%  Impression:  This 86 year old gentleman has stage D, severe, symptomatic aortic stenosis with New York Heart Association class III symptoms of exertional fatigue and shortness of breath consistent with chronic diastolic congestive heart failure.  I have personally reviewed his 2D echocardiogram, cardiac catheterization, and CTA studies.  His echocardiogram shows a trileaflet aortic valve with severe calcification and restriction of the leaflets.  The mean gradient is 37 mmHg with a dimensionless index of  0.21 and a valve area of 0.67 cm consistent with severe aortic stenosis.  Left ventricular ejection fraction is 50 to 55%.  Cardiac catheterization showed diffuse 75% in-stent restenosis throughout the mid LAD as  well as a calcific proximal 50 to 60% LAD stenosis.  He is not having any anginal symptoms and after discussion with the multidisciplinary heart valve team it was felt that this lesion could be treated medically in this patient.  I agree that aortic valve replacement is indicated in this patient with progressive symptoms of severe aortic stenosis.  Given his age, prior stroke, and some memory issues I think transcatheter aortic valve replacement would be the best option for him.  His gated cardiac CTA shows anatomy suitable for TAVR using a Sapien 3 valve.  His abdominal and pelvic CTA shows adequate pelvic vascular anatomy to allow transfemoral insertion.  The patient and his granddaughter were counseled at length regarding treatment alternatives for management of severe symptomatic aortic stenosis. The risks and benefits of surgical intervention has been discussed in detail. Long-term prognosis with medical therapy was discussed. Alternative approaches such as conventional surgical aortic valve replacement and CABG, transcatheter aortic valve replacement, and palliative medical therapy were compared and contrasted at length. This discussion was placed in the context of the patient's own specific clinical presentation and past medical history. All of their questions have been addressed.   Following the decision to proceed with transcatheter aortic valve replacement, a discussion was held regarding what types of management strategies would be attempted intraoperatively in the event of life-threatening complications, including whether or not the patient would be considered a candidate for the use of cardiopulmonary bypass and/or conversion to open sternotomy for attempted surgical intervention. The patient is aware of the fact that transient use of cardiopulmonary bypass may be necessary.  He is 86 years old but still active and independent.  I would consider him a candidate for emergent sternotomy to manage  intraoperative complications depending on the situation.  The patient has been advised of a variety of complications that might develop including but not limited to risks of death, stroke, paravalvular leak, aortic dissection or other major vascular complications, aortic annulus rupture, device embolization, cardiac rupture or perforation, mitral regurgitation, acute myocardial infarction, arrhythmia, heart block or bradycardia requiring permanent pacemaker placement, congestive heart failure, respiratory failure, renal failure, pneumonia, infection, other late complications related to structural valve deterioration or migration, or other complications that might ultimately cause a temporary or permanent loss of functional independence or other long term morbidity. The patient provides full informed consent for the procedure as described and all questions were answered.     Plan:  He will be scheduled for transfemoral transcatheter aortic valve replacement on Tuesday, 09/24/2020.  I spent 50 minutes performing this consultation and > 50% of this time was spent face to face counseling and coordinating the care of this patient's severe symptomatic aortic stenosis.    Gaye Pollack, MD 09/19/2020 4:00 PM

## 2020-09-20 ENCOUNTER — Other Ambulatory Visit (HOSPITAL_COMMUNITY)
Admission: RE | Admit: 2020-09-20 | Discharge: 2020-09-20 | Disposition: A | Discharge status: Home / Self Care | Payer: Medicare HMO | Source: Ambulatory Visit | Attending: Cardiovascular Disease | Admitting: Cardiovascular Disease

## 2020-09-20 ENCOUNTER — Ambulatory Visit: Payer: Medicare HMO | Attending: Physician Assistant | Admitting: Physical Therapy

## 2020-09-20 ENCOUNTER — Encounter (HOSPITAL_COMMUNITY): Payer: Self-pay

## 2020-09-20 ENCOUNTER — Encounter: Payer: Self-pay | Admitting: Physical Therapy

## 2020-09-20 ENCOUNTER — Encounter (HOSPITAL_COMMUNITY)
Admission: RE | Admit: 2020-09-20 | Discharge: 2020-09-20 | Disposition: A | Discharge status: Home / Self Care | Payer: Medicare HMO | Source: Ambulatory Visit | Attending: Cardiovascular Disease | Admitting: Cardiovascular Disease

## 2020-09-20 ENCOUNTER — Ambulatory Visit (HOSPITAL_COMMUNITY)
Admission: RE | Admit: 2020-09-20 | Discharge: 2020-09-20 | Disposition: A | Discharge status: Home / Self Care | Payer: Medicare HMO | Source: Ambulatory Visit | Attending: Cardiovascular Disease | Admitting: Cardiovascular Disease

## 2020-09-20 ENCOUNTER — Other Ambulatory Visit: Payer: Self-pay

## 2020-09-20 DIAGNOSIS — N400 Enlarged prostate without lower urinary tract symptoms: Secondary | ICD-10-CM | POA: Diagnosis not present

## 2020-09-20 DIAGNOSIS — Z79899 Other long term (current) drug therapy: Secondary | ICD-10-CM | POA: Diagnosis not present

## 2020-09-20 DIAGNOSIS — I350 Nonrheumatic aortic (valve) stenosis: Secondary | ICD-10-CM

## 2020-09-20 DIAGNOSIS — Z01818 Encounter for other preprocedural examination: Secondary | ICD-10-CM | POA: Diagnosis present

## 2020-09-20 DIAGNOSIS — Z20822 Contact with and (suspected) exposure to covid-19: Secondary | ICD-10-CM | POA: Diagnosis not present

## 2020-09-20 DIAGNOSIS — I2510 Atherosclerotic heart disease of native coronary artery without angina pectoris: Secondary | ICD-10-CM | POA: Insufficient documentation

## 2020-09-20 DIAGNOSIS — Z8673 Personal history of transient ischemic attack (TIA), and cerebral infarction without residual deficits: Secondary | ICD-10-CM | POA: Diagnosis not present

## 2020-09-20 DIAGNOSIS — I10 Essential (primary) hypertension: Secondary | ICD-10-CM | POA: Insufficient documentation

## 2020-09-20 DIAGNOSIS — M6281 Muscle weakness (generalized): Secondary | ICD-10-CM | POA: Insufficient documentation

## 2020-09-20 DIAGNOSIS — Z87891 Personal history of nicotine dependence: Secondary | ICD-10-CM | POA: Diagnosis not present

## 2020-09-20 DIAGNOSIS — I252 Old myocardial infarction: Secondary | ICD-10-CM | POA: Diagnosis not present

## 2020-09-20 HISTORY — DX: Shortness of breath: R06.02

## 2020-09-20 LAB — SARS CORONAVIRUS 2 (TAT 6-24 HRS): SARS Coronavirus 2: NEGATIVE

## 2020-09-20 LAB — CBC
HCT: 41.4 % (ref 39.0–52.0)
Hemoglobin: 13.8 g/dL (ref 13.0–17.0)
MCH: 31.6 pg (ref 26.0–34.0)
MCHC: 33.3 g/dL (ref 30.0–36.0)
MCV: 94.7 fL (ref 80.0–100.0)
Platelets: 199 10*3/uL (ref 150–400)
RBC: 4.37 MIL/uL (ref 4.22–5.81)
RDW: 13.8 % (ref 11.5–15.5)
WBC: 5.8 10*3/uL (ref 4.0–10.5)
nRBC: 0 % (ref 0.0–0.2)

## 2020-09-20 LAB — COMPREHENSIVE METABOLIC PANEL
ALT: 21 U/L (ref 0–44)
AST: 27 U/L (ref 15–41)
Albumin: 3.8 g/dL (ref 3.5–5.0)
Alkaline Phosphatase: 73 U/L (ref 38–126)
Anion gap: 12 (ref 5–15)
BUN: 20 mg/dL (ref 8–23)
CO2: 18 mmol/L — ABNORMAL LOW (ref 22–32)
Calcium: 8.8 mg/dL — ABNORMAL LOW (ref 8.9–10.3)
Chloride: 109 mmol/L (ref 98–111)
Creatinine, Ser: 1.22 mg/dL (ref 0.61–1.24)
GFR, Estimated: 53 mL/min — ABNORMAL LOW (ref >60)
Glucose, Bld: 107 mg/dL — ABNORMAL HIGH (ref 70–99)
Potassium: 3.9 mmol/L (ref 3.5–5.1)
Sodium: 139 mmol/L (ref 135–145)
Total Bilirubin: 1.2 mg/dL (ref 0.3–1.2)
Total Protein: 6.5 g/dL (ref 6.5–8.1)

## 2020-09-20 LAB — PROTIME-INR
INR: 1.2 (ref 0.8–1.2)
Prothrombin Time: 14.2 seconds (ref 11.4–15.2)

## 2020-09-20 LAB — URINALYSIS, ROUTINE W REFLEX MICROSCOPIC
Bilirubin Urine: NEGATIVE
Glucose, UA: NEGATIVE mg/dL
Hgb urine dipstick: NEGATIVE
Ketones, ur: NEGATIVE mg/dL
Leukocytes,Ua: NEGATIVE
Nitrite: NEGATIVE
Protein, ur: NEGATIVE mg/dL
Specific Gravity, Urine: 1.023 (ref 1.005–1.030)
pH: 5 (ref 5.0–8.0)

## 2020-09-20 LAB — SURGICAL PCR SCREEN
MRSA, PCR: NEGATIVE
Staphylococcus aureus: POSITIVE — AB

## 2020-09-20 LAB — BLOOD GAS, ARTERIAL
Acid-base deficit: 2.9 mmol/L — ABNORMAL HIGH (ref 0.0–2.0)
Bicarbonate: 21 mmol/L (ref 20.0–28.0)
Drawn by: 58793
FIO2: 21
O2 Saturation: 96.9 %
Patient temperature: 37
pCO2 arterial: 33.8 mmHg (ref 32.0–48.0)
pH, Arterial: 7.409 (ref 7.350–7.450)
pO2, Arterial: 91.9 mmHg (ref 83.0–108.0)

## 2020-09-20 LAB — BRAIN NATRIURETIC PEPTIDE: B Natriuretic Peptide: 109.5 pg/mL — ABNORMAL HIGH (ref 0.0–100.0)

## 2020-09-20 LAB — APTT: aPTT: 31 seconds (ref 24–36)

## 2020-09-20 LAB — TYPE AND SCREEN
ABO/RH(D): O POS
Antibody Screen: NEGATIVE

## 2020-09-20 NOTE — Progress Notes (Addendum)
PCP - Dr. Barron Alvine Cardiologist - Dr. Gypsy Balsam  PPM/ICD - Loop Recorder   Chest x-ray - 09/20/2020 EKG - 09/20/2020 Stress Test - denies ECHO - 08/05/2020 Cardiac Cath - 9/16/22021  Sleep Study - denies CPAP - N/A  DM: denies  Blood Thinner Instructions: PLAVIX: per surgeon's office, continue taking as prescribed but not on the day of surgery.  ERAS Protcol - No  COVID TEST- Scheduled for today after PAT appointment. Patient verbalized understanding of self-quarantine instructions, appointment time and place.  Anesthesia review: YES, cardiac hx  Patient denies shortness of breath, fever, cough and chest pain at PAT appointment  All instructions explained to the patient, with a verbal understanding of the material. Patient agrees to go over the instructions while at home for a better understanding. Patient also instructed to self quarantine after being tested for COVID-19. The opportunity to ask questions was provided.

## 2020-09-20 NOTE — Therapy (Signed)
[facility] [address] [place], [place], [address] Phone: [phone]   Fax:  [phone]  Physical Therapy Evaluation  Patient Details  Name: Michael Kidd MRN: [id number] Date of Birth: 05/16/1930 Referring Provider (PT): Janetta Hora, [place]   Encounter Date: 09/20/2020   PT End of Session - 09/20/20 0902    Visit Number 1    Number of Visits 1    Date for PT Re-Evaluation 09/20/20    PT Start Time 0905    PT Stop Time 0938    PT Time Calculation (min) 33 min    Activity Tolerance Patient tolerated treatment well    Behavior During Therapy [facility] for tasks assessed/performed           Past Medical History:  Diagnosis Date   Arthritis    Bilateral chronic knee pain    BPH (benign prostatic hyperplasia)    CAD (coronary artery disease)    s/p remote LAD PCI   Dysthymia    HTN (hypertension)    Myocardial infarction [facility])    Renal cyst    Stroke [facility])    November    Past Surgical History:  Procedure Laterality Date   cataract repair     HERNIA REPAIR     unsure of date   LOOP RECORDER INSERTION N/A 08/28/2019   Procedure: LOOP RECORDER INSERTION;  Surgeon: Marinus Maw, MD;  Location: [facility] INVASIVE CV LAB;  Service: Cardiovascular;  Laterality: N/A;   PERCUTANEOUS CORONARY STENT INTERVENTION (PCI-S)     RIGHT/LEFT HEART CATH AND CORONARY ANGIOGRAPHY N/A 08/22/2020   Procedure: RIGHT/LEFT HEART CATH AND CORONARY ANGIOGRAPHY;  Surgeon: Marykay Lex, MD;  Location: [facility] INVASIVE CV LAB;  Service: Cardiovascular;  Laterality: N/A;    There were no vitals filed for this visit.    Subjective Assessment - 09/20/20 0919    Subjective pt is 86 M with CC of SOB and feeling fatigue that has been going on for over a year and is getting worese. pt has a hx or a stroke in November which he had recovered from.    Currently in Pain? Yes    Pain Score 5     Pain Location Flank    Pain Orientation  Right    Pain Descriptors / Indicators Aching;Sore    Pain Type Chronic pain    Aggravating Factors  N/A    Pain Relieving Factors Tylenol              [facility] PT Assessment - 09/20/20 0903      Assessment   Medical Diagnosis Severe aortic stenosis    Referring Provider (PT) Janetta Hora, PA-C    Onset Date/Surgical Date --   for year    Hand Dominance Right      Precautions   Precautions None      Restrictions   Weight Bearing Restrictions No      Balance Screen   Has the patient fallen in the past 6 months No    Has the patient had a decrease in activity level because of a fear of falling?  No    Is the patient reluctant to leave their home because of a fear of falling?  No      Home Nurse, mental health Private residence    Living Arrangements Spouse/significant other    Available Help at Discharge Family    Type of Home House    Home Access Ramped entrance  Home Layout One level    Home Equipment Walker - 2 wheels;Cane - single point      ROM / Strength   AROM / PROM / Strength AROM;Strength      AROM   Overall AROM  Deficits    Overall AROM Comments limited bil shoulder flexion, and R shoulder IR/ER  and reaching behind back compared bil      Strength   Overall Strength Comments bil shoulder flexion 3/5, and extesnion 4-/5,  LE: R hip flexion 3+/5, all other LE testing 4+/5    Right Hand Grip (lbs) 80    Left Hand Grip (lbs) 56      Ambulation/Gait   Ambulation/Gait Yes    Gait Pattern Step-through pattern;Decreased stride length    Gait Comments pt was able to ambulate 740 ft in 4 min requiring seated rest break for remainder of the 6 min walk test, O2 93%, pulse 87            [facility] Pre-Surgical Assessment - 09/20/20 0001    5 Meter Walk Test- trial 1 4 sec    5 Meter Walk Test- trial 2 4 sec.     5 Meter Walk Test- trial 3 4 sec.    5 meter walk test average 4 sec    4 Stage Balance Test tolerated for:  2 sec.    4 Stage  Balance Test Position 3    Sit To Stand Test- trial 1 --   unable to perform   ADL/IADL Independent with: Bathing;Dressing;Meal prep;Yard work    ADL/IADL Needs Assistance with: Finances    ADL/IADL Fraility Index Vulnerable    6 Minute Walk- Baseline yes    BP (mmHg) 124/72    HR (bpm) 60    02 Sat (%RA) 98 %    Modified Borg Scale for Dyspnea 5- Strong or hard breathing    Perceived Rate of Exertion (Borg) 6-    6 Minute Walk Post Test yes    BP (mmHg) 138/80    HR (bpm) 87    02 Sat (%RA) 93 %    Modified Borg Scale for Dyspnea 3- Moderate shortness of breath or breathing difficulty    Perceived Rate of Exertion (Borg) 13- Somewhat hard    Aerobic Endurance Distance Walked 740    Endurance additional comments pt is 45.91% limited compared to age related norm                    Objective measurements completed on examination: See above findings.                            Plan - 09/20/20 0913    Clinical Impression Statement See note    Stability/Clinical Decision Making Stable/Uncomplicated    Clinical Decision Making Low    PT Frequency One time visit    PT Next Visit Plan Pre-Tavr evaluation           Clinical Impression Statement: Pt is a 86 yo M presenting to OP PT for evaluation prior to possible TAVR surgery due to severe aortic stenosis. Pt reports onset of SOB and general fatigue approximately 12 months ago. Symptoms are limiting endurance. Pt presents with limited bil UE flexion and R shoulder IR/ER ROM and limited strength in bil UE and R hip flexors, limited balance and is assessed as moderate at high fall risk 4 stage balance test, good walking speed and limited  aerobic endurance per 6 minute walk test. Pt ambulated 740 feet in 4 min before requesting a seated rest beak lasting remainder of the 6 min walk test. At time of rest, patients HR was 87 bpm and O2 was 93% on room air. Pt reported 3/10 shortness of breath on modified scale  for dyspnea.. Pt ambulated a total of 740  feet in 6 minute walk. SOB and fatigue increased significantly with 6 minute walk test. Based on the Short Physical Performance Battery, patient has a frailty rating of 7/12 with </= 5/12 considered frail.     Patient demonstrated the following deficits and impairments:     Visit Diagnosis: Muscle weakness (generalized)     Problem List Patient Active Problem List   Diagnosis Date Noted   CAD (coronary artery disease)    Symptomatic severe aortic stenosis with normal ejection fraction 08/15/2020   Dyspnea on exertion 08/15/2020   Coronary artery disease with PCI and stenting many years ago 10/19/2019   Late effect of cerebrovascular accident (CVA) 10/19/2019   Stroke (HCC) 08/26/2019   Essential hypertension 08/26/2019   BPH (benign prostatic hyperplasia) 08/26/2019   Hyperlipidemia with target LDL less than 70 08/26/2019   Amen Staszak PT, DPT, LAT, ATC  09/20/20  9:58 AM      [facility] [address] [place], [place], [address] Phone: [phone]   Fax:  [phone]  Name: Michael Kidd MRN: [id number] Date of Birth: 1930-11-05

## 2020-09-21 LAB — HEMOGLOBIN A1C
Hgb A1c MFr Bld: 5.8 % — ABNORMAL HIGH (ref 4.8–5.6)
Mean Plasma Glucose: 120 mg/dL

## 2020-09-23 ENCOUNTER — Encounter (HOSPITAL_COMMUNITY): Payer: Self-pay | Admitting: Cardiovascular Disease

## 2020-09-23 ENCOUNTER — Ambulatory Visit: Payer: Medicare HMO | Admitting: Cardiology

## 2020-09-23 MED ORDER — NOREPINEPHRINE 4 MG/250ML-% IV SOLN
0.0000 ug/min | INTRAVENOUS | Status: AC
  Administered 2020-09-24: 1 ug/min via INTRAVENOUS
  Filled 2020-09-23: qty 250

## 2020-09-23 MED ORDER — MAGNESIUM SULFATE 50 % IJ SOLN
40.0000 meq | INTRAMUSCULAR | Status: DC
  Filled 2020-09-23: qty 9.85

## 2020-09-23 MED ORDER — DEXMEDETOMIDINE HCL IN NACL 400 MCG/100ML IV SOLN
0.1000 ug/kg/h | INTRAVENOUS | Status: AC
  Administered 2020-09-24: 96.32 ug via INTRAVENOUS
  Administered 2020-09-24: .4 ug/kg/h via INTRAVENOUS
  Filled 2020-09-23: qty 100

## 2020-09-23 MED ORDER — SODIUM CHLORIDE 0.9 % IV SOLN
1.5000 g | INTRAVENOUS | Status: AC
  Administered 2020-09-24: 1.5 g via INTRAVENOUS
  Filled 2020-09-23: qty 1.5

## 2020-09-23 MED ORDER — POTASSIUM CHLORIDE 2 MEQ/ML IV SOLN
80.0000 meq | INTRAVENOUS | Status: DC
  Filled 2020-09-23: qty 40

## 2020-09-23 MED ORDER — VANCOMYCIN HCL 1250 MG/250ML IV SOLN
1250.0000 mg | INTRAVENOUS | Status: AC
  Administered 2020-09-24: 1500 mg via INTRAVENOUS
  Filled 2020-09-23: qty 250

## 2020-09-23 MED ORDER — SODIUM CHLORIDE 0.9 % IV SOLN
INTRAVENOUS | Status: DC
  Filled 2020-09-23: qty 30

## 2020-09-23 NOTE — Progress Notes (Signed)
Anesthesia Chart Review:  Case: [id number] Date/Time: 09/24/20 0715   Procedures:      TRANSCATHETER AORTIC VALVE REPLACEMENT, TRANSFEMORAL (N/A Chest)     TRANSESOPHAGEAL ECHOCARDIOGRAM (TEE) (N/A )   Anesthesia type: General   Pre-op diagnosis: Severe Aortic Stenosis   [facility]: [facility] ROOM [facility] / [facility]   Surgeons: Tonny Bollman, MD    CT Surgeon: Evelene Croon, MD   DISCUSSION: Patient is an 86 year old male scheduled for the above procedure.  History includes former smoker, severe aortic stenosis, HTN, CAD (MI, s/p remote history LAD stent), CVA (08/26/19), loop recorder (08/28/19), exertional dyspnea, renal cysts, BPH.   Loop recorder interrogation summary 09/07/20: ILR summary report received. Battery status OK. Normal device function. No new symptom episodes, tachy episodes, brady, or pause episodes. No new AF episodes.  Reported last Plavix scheduled for 09/23/20. 09/20/2020 presurgical COVID-19 test negative.  Anesthesia team to evaluate on the day of surgery.   VS: BP 118/76   Pulse 78   Temp 36.4 C (Oral)   Resp 19   Ht 5\' 11"  (1.803 m)   Wt 96.3 kg   SpO2 99%   BMI 29.62 kg/m     PROVIDERS: , MD is PCP  Barron Alvine, MD is cardiologist   LABS: Labs reviewed: Acceptable for surgery. (all labs ordered are listed, but only abnormal results are displayed)  Labs Reviewed  SURGICAL PCR SCREEN - Abnormal; Notable for the following components:      Result Value   Staphylococcus aureus POSITIVE (*)    All other components within normal limits  BLOOD GAS, ARTERIAL - Abnormal; Notable for the following components:   Acid-base deficit 2.9 (*)    All other components within normal limits  BRAIN NATRIURETIC PEPTIDE - Abnormal; Notable for the following components:   B Natriuretic Peptide 109.5 (*)    All other components within normal limits  COMPREHENSIVE METABOLIC PANEL - Abnormal; Notable for the following components:   CO2 18 (*)    Glucose, Bld  107 (*)    Calcium 8.8 (*)    GFR, Estimated 53 (*)    All other components within normal limits  HEMOGLOBIN A1C - Abnormal; Notable for the following components:   Hgb A1c MFr Bld 5.8 (*)    All other components within normal limits  APTT  CBC  PROTIME-INR  URINALYSIS, ROUTINE W REFLEX MICROSCOPIC  TYPE AND SCREEN    IMAGES: CXR 09/20/20: FINDINGS: And shallow lung inflation. No focal airspace consolidation or pulmonary edema. Normal pleural spaces. IMPRESSION: No active cardiopulmonary disease.  CTA chest/abd/pelvis 09/13/20: IMPRESSION: 1. Vascular findings and measurements pertinent to potential TAVR procedure, as detailed above. 2. Severe thickening calcification of the aortic valve, compatible with reported clinical history of severe aortic stenosis. 3. Mild cardiomegaly with left atrial dilatation. 4. Aortic atherosclerosis, in addition to left main and 3 vessel coronary artery disease. 5. Mild elevation of the right hemidiaphragm. 6. Mild bilateral renal atrophy. 7. Colonic diverticulosis without evidence of acute diverticulitis at this time. 8. Additional incidental findings, as above. [See full report]   EKG: 09/20/20: Normal sinus rhythm Nonspecific ST and T wave abnormality Abnormal ECG Confirmed by 09/22/20 (Weston Brass) on 09/20/2020 8:55:43 PM   CV: Carotid 09/22/2020 09/13/20: Proximal BICA stenosis 1-39%, calcific plaque. Multiphasic signal bilateral subclavian arteries. Antegrade flow bilateral vertebral arteries.    CT Coronary 09/13/20: IMPRESSION: 1. Trileaflet aortic valve with severely calcified leaflets and severely restricted leaflet opening. Only trivial calcifications are extending into  the LVOT. Aortic valve calcium score is 2667 consistent with severe aortic stenosis. Annular measurements suitable for delivery of a 26 mm Edwards-SAPIEN 3 Ultra valve. 2. Sufficient coronary to annulus distance. 3. Optimum Fluoroscopic Angle for Delivery:  RAO 12 CRA 5 4. No thrombus in the left atrial appendage.   RHC/LHC 08/22/20:  There is moderate aortic valve stenosis.  1st Diag lesion is 60% stenosed.  Mid LAD lesion is 50% stenosed.  Mid LAD to Dist LAD lesion is 75% stenosed.  Essentially normal right heart cath pressures with mildly elevated LVEDP.  Likely inaccurate measurement of aortic valve gradient estimated at 18 mmHg.   SUMMARY  Severe aortic stenosis by echocardiogram -> by cath, mean gradient 18 mmHg.  Severe vessel disease with 75 to 80% ISR in the mid LAD crossing 2nd Diag ? Otherwise no significant CAD.  Relatively normal Right Heart Cath Pressures and Cardiac Index/Output ? RAP 6 mmHg, RVP 23/0 mmHg-RVEDP 2 mmHg, ? PAP 33/13 mmHg-mean 17 mmHg; PCWP 20-25 mmHg ? Ao sat 90%, PA sat 75%; Cardiac Output-Index Hiram Comber) 6.38-2.95  RECOMMENDATIONS  Case was discussed with Dr. Excell Seltzer and Cline Crock, PA from the TAVR team.  Plan will be to discharge home set up follow-up with Dr. Excell Seltzer in the valve clinic to determine the appropriate course of action simultaneous versus staged PCI LAD and TAVR (potentially versus CABG/AVR)   Echo 08/05/20: IMPRESSIONS  1. Left ventricular ejection fraction, by estimation, is 50 to 55%. The  left ventricle has low normal function. The left ventricle has no regional  wall motion abnormalities. There is severe concentric left ventricular  hypertrophy. Left ventricular  diastolic parameters are consistent with Grade I diastolic dysfunction  (impaired relaxation).  2. Abnormal left ventricular global longitudinal strain ( -9.1 %).  3. Right ventricular systolic function is normal. The right ventricular  size is normal. There is normal pulmonary artery systolic pressure.  4. The mitral valve is normal in structure. Trivial mitral valve  regurgitation. No evidence of mitral stenosis.  5. The aortic valve thickened and heavily calcified. Aortic valve  regurgitation is  not visualized. Severe low flow low gradient aortic  stenosis is present. AVA, by VTI measures 0.67 cm. Aortic valve mean  gradient 37.0 mmHg. Aortic valve Vmax 3.73 m/s.  SVI 29. DI 0.21  6. The inferior vena cava is normal in size with greater than 50%  respiratory variability, suggesting right atrial pressure of 3 mmHg.   Past Medical History:  Diagnosis Date  . Arthritis   . Bilateral chronic knee pain   . BPH (benign prostatic hyperplasia)   . CAD (coronary artery disease)    s/p remote LAD PCI  . Dysthymia   . HTN (hypertension)   . Myocardial infarction (HCC)   . Renal cyst   . Short of breath on exertion    per patient  . Stroke [facility])    November    Past Surgical History:  Procedure Laterality Date  . CATARACT EXTRACTION W/ INTRAOCULAR LENS  IMPLANT, BILATERAL    . cataract repair    . HERNIA REPAIR     unsure of date  . LOOP RECORDER INSERTION N/A 08/28/2019   Procedure: LOOP RECORDER INSERTION;  Surgeon: Marinus Maw, MD;  Location: [facility] INVASIVE CV LAB;  Service: Cardiovascular;  Laterality: N/A;  . PERCUTANEOUS CORONARY STENT INTERVENTION (PCI-S)    . RIGHT/LEFT HEART CATH AND CORONARY ANGIOGRAPHY N/A 08/22/2020   Procedure: RIGHT/LEFT HEART CATH AND CORONARY ANGIOGRAPHY;  Surgeon: Herbie Baltimore,  Piedad Climes, MD;  Location: [facility] INVASIVE CV LAB;  Service: Cardiovascular;  Laterality: N/A;    MEDICATIONS: . acetaminophen (TYLENOL) 650 MG CR tablet  . albuterol (VENTOLIN HFA) 108 (90 Base) MCG/ACT inhaler  . atorvastatin (LIPITOR) 40 MG tablet  . calcium carbonate (TUMS - DOSED IN MG ELEMENTAL CALCIUM) 500 MG chewable tablet  . clopidogrel (PLAVIX) 75 MG tablet  . finasteride (PROSCAR) 5 MG tablet  . fluticasone (FLONASE) 50 MCG/ACT nasal spray  . lisinopril (ZESTRIL) 5 MG tablet  . loperamide (IMODIUM) 2 MG capsule  . metoprolol tartrate (LOPRESSOR) 25 MG tablet  . Naphazoline-Glycerin (CLEAR EYES MAX REDNESS RELIEF) 0.03-0.5 % SOLN  . omeprazole (PRILOSEC) 20 MG  capsule  . tamsulosin (FLOMAX) 0.4 MG CAPS capsule   No current facility-administered medications for this encounter.    Shonna Chock, PA-C Surgical Short Stay/Anesthesiology [facility] Phone [phone] [place] Phone [phone] 09/23/2020 10:53 AM

## 2020-09-23 NOTE — Anesthesia Preprocedure Evaluation (Addendum)
Anesthesia Evaluation  Patient identified by MRN, date of birth, ID band Patient awake    Reviewed: Allergy & Precautions, NPO status , Patient's Chart, lab work & pertinent test results  Airway Mallampati: I  TM Distance: >3 FB Neck ROM: Full    Dental   Pulmonary former smoker,    Pulmonary exam normal        Cardiovascular hypertension, Pt. on medications + CAD, + Past MI and + Cardiac Stents  Normal cardiovascular exam+ Valvular Problems/Murmurs AS      Neuro/Psych CVA    GI/Hepatic   Endo/Other    Renal/GU      Musculoskeletal   Abdominal   Peds  Hematology   Anesthesia Other Findings   Reproductive/Obstetrics                             Anesthesia Physical Anesthesia Plan  ASA: III  Anesthesia Plan: MAC   Post-op Pain Management:    Induction: Intravenous  PONV Risk Score and Plan: 1 and Treatment may vary due to age or medical condition  Airway Management Planned: Nasal Cannula  Additional Equipment: Arterial line  Intra-op Plan:   Post-operative Plan:   Informed Consent: I have reviewed the patients History and Physical, chart, labs and discussed the procedure including the risks, benefits and alternatives for the proposed anesthesia with the patient or authorized representative who has indicated his/her understanding and acceptance.       Plan Discussed with: CRNA and Surgeon  Anesthesia Plan Comments: (PAT note written 09/23/2020 by Myra Gianotti, PA-C. )       Anesthesia Quick Evaluation

## 2020-09-23 NOTE — H&P (Signed)
[address]       [name] [address]             [phone]      Cardiothoracic Surgery Admission History and Physical   Referring Provider is Park Liter, MD  Primary Cardiologist is No primary care provider on file.  PCP is Jene Every, MD      Chief Complaint  Patient presents with  . Aortic Stenosis       HPI:   The patient is an 86 year old gentleman with a history of hypertension, coronary artery disease status post remote PCI of the LAD, stroke last year with expressive aphasia that completely resolved, and aortic stenosis. He had an echocardiogram 07/16/2019 at the time of his stroke which showed a trileaflet aortic valve with moderate thickening and calcification. The mean gradient was 24 mmHg with peak gradient of 35 mmHg. Aortic valve area was 0.79 cm. Left ventricular ejection fraction was 55 to 60%. There was no sign of intracardiac thrombus. He was recently seen back by Dr. Agustin Cree and in August 2021 reporting worsening exertional dyspnea and fatigue. Repeat echocardiogram on 08/05/2020 showed an increase in the mean aortic valve gradient to 37 mmHg with a peak gradient of 56 mmHg. Aortic valve area was 0.67 cm. Left ventricular ejection fraction was 50 to 55%.  He reports shortness of breath and fatigue with any walking or doing yard work. He has been riding his lawnmower but still gets tired doing that. He denies any chest pressure or pain. He has had no orthopnea or PND. Denies peripheral edema. He denies dizziness and syncope.  He lives with his wife at home. They both have some memory issues but has been independent.       Past Medical History:  Diagnosis Date  . Arthritis   . Bilateral chronic knee pain   . BPH (benign prostatic hyperplasia)   . CAD (coronary artery disease)    s/p remote LAD PCI  . Dysthymia   . HTN (hypertension)   . Renal cyst         Past Surgical History:  Procedure Laterality Date  . LOOP RECORDER  INSERTION N/A 08/28/2019   Procedure: LOOP RECORDER INSERTION; Surgeon: Evans Lance, MD; Location: [facility]; Service: Cardiovascular; Laterality: N/A;  . PERCUTANEOUS CORONARY STENT INTERVENTION (PCI-S)    . RIGHT/LEFT HEART CATH AND CORONARY ANGIOGRAPHY N/A 08/22/2020   Procedure: RIGHT/LEFT HEART CATH AND CORONARY ANGIOGRAPHY; Surgeon: Leonie Man, MD; Location: [facility]; Service: Cardiovascular; Laterality: N/A;        Family History  Problem Relation Age of Onset  . Diabetes Mother   . Stroke Father    Social History        Socioeconomic History  . Marital status: Married    Spouse name: Not on file  . Number of children: Not on file  . Years of education: Not on file  . Highest education level: Not on file  Occupational History  . Not on file  Tobacco Use  . Smoking status: Former Research scientist (life sciences)  . Smokeless tobacco: Never Used  Vaping Use  . Vaping Use: Never used  Substance and Sexual Activity  . Alcohol use: Not Currently  . Drug use: Never  . Sexual activity: Not on file  Other Topics Concern  . Not on file  Social History Narrative  . Not on file   Social Determinants of Health  Financial Resource Strain:   . Difficulty of Paying Living Expenses: Not on file  Food Insecurity:   . Worried About Charity fundraiser in the Last Year: Not on file  . Ran Out of Food in the Last Year: Not on file  Transportation Needs:   . Lack of Transportation (Medical): Not on file  . Lack of Transportation (Non-Medical): Not on file  Physical Activity:   . Days of Exercise per Week: Not on file  . Minutes of Exercise per Session: Not on file  Stress:   . Feeling of Stress : Not on file  Social Connections:   . Frequency of Communication with Friends and Family: Not on file  . Frequency of Social Gatherings with Friends and Family: Not on file  . Attends Religious Services: Not on file  . Active Member of Clubs or Organizations: Not on file  .  Attends Archivist Meetings: Not on file  . Marital Status: Not on file  Intimate Partner Violence:   . Fear of Current or Ex-Partner: Not on file  . Emotionally Abused: Not on file  . Physically Abused: Not on file  . Sexually Abused: Not on file         Current Outpatient Medications  Medication Sig Dispense Refill  . acetaminophen (TYLENOL) 650 MG CR tablet Take 325 mg by mouth in the morning and at bedtime.     Marland Kitchen albuterol (VENTOLIN HFA) 108 (90 Base) MCG/ACT inhaler Inhale 2 puffs into the lungs every 6 (six) hours as needed.     Marland Kitchen atorvastatin (LIPITOR) 40 MG tablet Take 1 tablet (40 mg total) by mouth daily at 6 PM. (Patient taking differently: Take 20 mg by mouth daily at 6 PM. ) 30 tablet 11  . calcium carbonate (TUMS - DOSED IN MG ELEMENTAL CALCIUM) 500 MG chewable tablet Chew 1-2 tablets by mouth 3 (three) times daily as needed for indigestion or heartburn.    . clopidogrel (PLAVIX) 75 MG tablet Take 1 tablet (75 mg total) by mouth daily. 30 tablet 11  . finasteride (PROSCAR) 5 MG tablet Take 5 mg by mouth every evening.     . fluticasone (FLONASE) 50 MCG/ACT nasal spray Place 2 sprays into both nostrils daily as needed for allergies or rhinitis.    Marland Kitchen lisinopril (ZESTRIL) 5 MG tablet Take 5 mg by mouth daily.    Marland Kitchen loperamide (IMODIUM) 2 MG capsule Take 2-4 mg by mouth 4 (four) times daily as needed for diarrhea or loose stools.    . metoprolol tartrate (LOPRESSOR) 25 MG tablet Take 12.5 mg by mouth 2 (two) times daily.    . Naphazoline-Glycerin (CLEAR EYES MAX REDNESS RELIEF) 0.03-0.5 % SOLN Place 1-2 drops into both eyes 3 (three) times daily as needed (redness relief).    Marland Kitchen omeprazole (PRILOSEC) 20 MG capsule Take 20 mg by mouth daily.     . tamsulosin (FLOMAX) 0.4 MG CAPS capsule Take 0.4 mg by mouth every evening.      No current facility-administered medications for this visit.   No Known Allergies   Review of Systems:   General: normal appetite, + decreased  energy, no weight gain, no weight loss, no fever  Cardiac: no chest pain with exertion, no chest pain at rest, +SOB with mild exertion, no resting SOB, no PND, no orthopnea, no palpitations, no arrhythmia, no atrial fibrillation, no LE edema, no dizzy spells, no syncope  Respiratory: + exertional shortness of breath, no home oxygen, no  productive cough, no dry cough, no bronchitis, no wheezing, no hemoptysis, no asthma, no pain with inspiration or cough, no sleep apnea, no CPAP at night  GI: no difficulty swallowing, no reflux, no frequent heartburn, no hiatal hernia, no abdominal pain, no constipation, no diarrhea, no hematochezia, no hematemesis, no melena  GU: no dysuria, no frequency, no urinary tract infection, no hematuria, no enlarged prostate, no kidney stones, no kidney disease  Vascular: no pain suggestive of claudication, no pain in feet, no leg cramps, no varicose veins, no DVT, no non-healing foot ulcer  Neuro: + stroke last year, no TIA's, no seizures, no headaches, no temporary blindness one eye, no slurred speech, no peripheral neuropathy, no chronic pain, no instability of gait, + memory/cognitive dysfunction  Musculoskeletal: + arthritis, no joint swelling, no myalgias, no difficulty walking, normal mobility  Skin: no rash, no itching, no skin infections, no pressure sores or ulcerations  Psych: no anxiety, no depression, no nervousness, no unusual recent stress  Eyes: no blurry vision, no floaters, no recent vision changes, + wears glasses or contacts  ENT: no hearing loss, no loose or painful teeth, + dentures Hematologic: no easy bruising, no abnormal bleeding, no clotting disorder, no frequent epistaxis  Endocrine: no diabetes, does not check CBG's at home    Physical Exam:   BP 120/72  Pulse 76  Temp 97.6 F (36.4 C) (Skin)  Resp 20  Ht _0  (1.803 m)  Wt 211 lb (95.7 kg)  SpO2 95% Comment: RA  BMI 29.43 kg/m  General: Elderly but well-appearing  HEENT:  Unremarkable  Neck: no JVD, no bruits, no adenopathy or thyromegaly  Chest: clear to auscultation, symmetrical breath sounds, no wheezes, no rhonchi  CV: RRR, grade lll/VI crescendo/decrescendo murmur heard best at RSB, no diastolic murmur  Abdomen: soft, non-tender, no masses  Extremities: warm, well-perfused, pulses palpable at ankle, no LE edema  Rectal/GU Deferred  Neuro: Grossly non-focal and symmetrical throughout  Skin: Clean and dry, no rashes, no breakdown    Diagnostic Tests:   Patient Name: Michael Kidd Date of Exam: 08/05/2020  Medical Rec #: [id number] Height: 71.0 in  Accession #: [id number] Weight: 210.2 lb  Date of Birth: [birth year] BSA: 2.154 m  Patient Age: 38 years BP: 122/68 mmHg  Patient Gender: M HR: 66 bpm.  Exam Location: [facility]   Procedure: 2D Echo   Indications: CAD Native Vessel 414.01 / I25.10   History: Patient has prior history of Echocardiogram examinations,  most  recent 08/27/2019. Stroke; Risk Factors:Hypertension and  Dyslipidemia.   Sonographer: Luane School  Referring Phys: [place]    1. Left ventricular ejection fraction, by estimation, is 50 to 55%. The  left ventricle has low normal function. The left ventricle has no regional  wall motion abnormalities. There is severe concentric left ventricular  hypertrophy. Left ventricular  diastolic parameters are consistent with Grade I diastolic dysfunction  (impaired relaxation).  2. Abnormal left ventricular global longitudinal strain ( -9.1 %).  3. Right ventricular systolic function is normal. The right ventricular  size is normal. There is normal pulmonary artery systolic pressure.  4. The mitral valve is normal in structure. Trivial mitral valve  regurgitation. No evidence of mitral stenosis.  5. The aortic valve thickened and heavily calcified. Aortic valve  regurgitation is not visualized. Severe low flow low gradient aortic  stenosis is present. AVA,  by VTI measures 0.67 cm. Aortic valve mean  gradient 37.0 mmHg. Aortic valve  Vmax 3.73 m/s.  SVI 29. DI 0.21  6. The inferior vena cava is normal in size with greater than 50%  respiratory variability, suggesting right atrial pressure of 3 mmHg.   FINDINGS  Left Ventricle: Left ventricular ejection fraction, by estimation, is 50  to 55%. The left ventricle has low normal function. The left ventricle has  no regional wall motion abnormalities. The average left ventricular global  longitudinal strain is -9.1 %.  The left ventricular internal cavity size was normal in size. There is  severe concentric left ventricular hypertrophy. Left ventricular diastolic  parameters are consistent with Grade I diastolic dysfunction (impaired  relaxation).   Right Ventricle: The right ventricular size is normal. No increase in  right ventricular wall thickness. Right ventricular systolic function is  normal. There is normal pulmonary artery systolic pressure. The tricuspid  regurgitant velocity is 2.50 m/s, and  with an assumed right atrial pressure of 3 mmHg, the estimated right  ventricular systolic pressure is 09.8 mmHg.   Left Atrium: Left atrial size was normal in size.   Right Atrium: Right atrial size was normal in size.   Pericardium: There is no evidence of pericardial effusion.   Mitral Valve: The mitral valve is normal in structure. Normal mobility of  the mitral valve leaflets. Trivial mitral valve regurgitation. No evidence  of mitral valve stenosis.   Tricuspid Valve: The tricuspid valve is normal in structure. Tricuspid  valve regurgitation is mild . No evidence of tricuspid stenosis.   Aortic Valve: The aortic valve is abnormal. . There is moderate thickening  and severe calcifcation of the aortic valve. Aortic valve regurgitation is  not visualized. Severe aortic stenosis is present. Moderate aortic valve  annular calcification. There is  moderate thickening of the aortic  valve. There is severe calcifcation of  the aortic valve. Aortic valve mean gradient measures 37.0 mmHg. Aortic  valve peak gradient measures 55.7 mmHg. Aortic valve area, by VTI measures  0.67 cm.   Pulmonic Valve: The pulmonic valve was normal in structure. Pulmonic valve  regurgitation is not visualized. No evidence of pulmonic stenosis.   Aorta: The aortic root is normal in size and structure.   Venous: The inferior vena cava is normal in size with greater than 50%  respiratory variability, suggesting right atrial pressure of 3 mmHg.   IAS/Shunts: No atrial level shunt detected by color flow Doppler.    LEFT VENTRICLE  PLAX 2D  LVIDd: 5.30 cm Diastology  LVIDs: 3.60 cm LV e' lateral: 4.57 cm/s  LV PW: 1.60 cm LV E/e' lateral: 11.9  LV IVS: 1.60 cm LV e' medial: 3.70 cm/s  LVOT diam: 2.00 cm LV E/e' medial: 14.7  LV SV: 62  LV SV Index: 29 2D Longitudinal Strain  LVOT Area: 3.14 cm 2D Strain GLS Avg: -9.1 %    RIGHT VENTRICLE IVC  RV S prime: 9.03 cm/s IVC diam: 1.30 cm  TAPSE (M-mode): 2.0 cm   LEFT ATRIUM Index RIGHT ATRIUM Index  LA diam: 5.00 cm 2.32 cm/m RA Area: 14.30 cm  LA Vol (A2C): 58.1 ml 26.98 ml/m RA Volume: 29.60 ml 13.74 ml/m  LA Vol (A4C): 64.9 ml 30.14 ml/m  LA Biplane Vol: 61.5 ml 28.56 ml/m  AORTIC VALVE  AV Area (Vmax): 0.66 cm  AV Area (Vmean): 0.56 cm  AV Area (VTI): 0.67 cm  AV Vmax: 373.00 cm/s  AV Vmean: 287.500 cm/s  AV VTI: 0.926 m  AV Peak Grad: 55.7 mmHg  AV Mean  Grad: 37.0 mmHg  LVOT Vmax: 78.30 cm/s  LVOT Vmean: 51.500 cm/s  LVOT VTI: 0.197 m  LVOT/AV VTI ratio: 0.21   AORTA  Ao Root diam: 3.10 cm  Ao Asc diam: 3.40 cm   MITRAL VALVE TRICUSPID VALVE  MV Area (PHT): 2.09 cm TR Peak grad: 25.0 mmHg  MV Decel Time: 363 msec TR Vmax: 250.00 cm/s  MV E velocity: 54.30 cm/s  MV A velocity: 74.70 cm/s SHUNTS  MV E/A ratio: 0.73 Systemic VTI: 0.20 m  Systemic Diam: 2.00 cm   Godfrey Pick Tobb DO  Electronically signed by  Berniece Salines DO  Signature Date/Time: 08/05/2020/5:52:13 PM       Panel Physicians Referring Physician Case Authorizing Physician  Leonie Man, MD (Primary)    Procedures  RIGHT/LEFT HEART CATH AND CORONARY ANGIOGRAPHY  Conclusion  There is moderate aortic valve stenosis.  1st Diag lesion is 60% stenosed.  Mid LAD lesion is 50% stenosed.  Mid LAD to Dist LAD lesion is 75% stenosed.  Essentially normal right heart cath pressures with mildly elevated LVEDP.  Likely inaccurate measurement of aortic valve gradient estimated at 18 mmHg. SUMMARY  Severe aortic stenosis by echocardiogram -> by cath, mean gradient 18 mmHg.  Severe vessel disease with 75 to 80% ISR in the mid LAD crossing 2nd Diag  Otherwise no significant CAD. Relatively normal Right Heart Cath Pressures and Cardiac Index/Output  RAP 6 mmHg, RVP 23/0 mmHg-RVEDP 2 mmHg,  PAP 33/13 mmHg-mean 17 mmHg; PCWP 20-25 mmHg  Ao sat 90%, PA sat 75%; Cardiac Output-Index Kathlen Brunswick) 6.38-2.95 RECOMMENDATIONS  Case was discussed with Dr. Burt Knack and Angelena Form, PA from the TAVR team. Plan will be to discharge home set up follow-up with Dr. Burt Knack in the valve clinic to determine the appropriate course of action simultaneous versus staged PCI LAD and TAVR (potentially versus CABG/AVR) Glenetta Hew, MD  Recommendations  Antiplatelet/Anticoag Recommend dual antiplatelet therapy with Aspirin 47m daily and Clopidogrel 733mdaily long-term (beyond 12 months) because of Patient will likely require LAD PCI as well as TAVR.  Discharge Date In the absence of any other complications or medical issues, we expect the patient to be ready for discharge from a cath perspective on 08/22/2020. He will be set up for an appointment to see Dr. CoBurt Knackn the valve clinic at the first available date.  Surgeon Notes    08/22/2020 1:01 PM Brief Op Note addendum by HaLeonie ManMD  Indications  Symptomatic severe aortic stenosis with normal ejection  fraction [I35.0 (ICD-10-CM)]  CAD S/P percutaneous coronary angioplasty [I25.10, Z98.61 (ICD-10-CM)]  Procedural Details  Technical Details Primary CARE Physician: GiJene EveryD Cardiologist: Dr. KrDorothy [id number].o. male with distant history of CAD & newly diagnosed Severe Aortic Stenosis by Echo. He is symptomatic & is now referred for right and left heart catheterization as part of pre-AVR evaluation.  Time Out: Verified patient identification, verified procedure, site/side was marked, verified correct patient position, special equipment/implants available, medications/allergies/relevent history reviewed, required imaging and test results available. Performed.  Access:  Right Radial Artery: 6 Fr sheath -- Seldinger technique using Micropuncture Kit Direct ultrasound guidance used. Permanent image obtained and placed on chart. 10 mL radial cocktail IA; 5000 units IV Heparin * Right Brachial/Antecubital Vein: The existing 18-gauge IV was exchanged over a wire for a 5Fr sheath  Right Heart Catheterization: 5 Fr SwGordy Councilmanatheter advanced under fluoroscopy with balloon inflated to the RA, RV, then PCWP-PA for hemodynamic measurement.  *  Simultaneous FA & PA blood gases checked for SaO2% to calculate FICK CO/CI  * Catheter removed completely out of the body with balloon deflated.  Left Heart Catheterization: 5 Fr TIG 4.0 catheter was advanced or exchanged over a J-wire under direct fluoroscopic guidance into the ascending aorta;  * LV Hemodynamics (no LV Gram): Take 4.0 catheter-straight wire * Left and right Coronary Artery Cineangiography: TIG 4.0 catheter   Upon completion of Angiogaphy, the catheter was removed completely out of the body over a wire, without complication.  Brachial Sheath(s) removed in the PACU holding area with manual pressure for hemostasis.   Radial sheath removed in the Cardiac Catheterization lab with TR Band placed for hemostasis.  TR Band: 1000  Hours; 16 mL air  MEDICATIONS * SQ Lidocaine 23m * Radial Cocktail: 3 mg Verapmil in 10 mL NS * Isovue Contrast: 50 mL * Heparin: 5000 units Estimated blood loss <50 mL.   During this procedure medications were administered to achieve and maintain moderate conscious sedation while the patient's heart rate, blood pressure, and oxygen saturation were continuously monitored and I was present face-to-face 100% of this time.  Medications  (Filter: Administrations occurring from [phone] 1020 on 08/22/20)  fentaNYL (SUBLIMAZE) injection (mcg)  Total dose: 25 mcg  Date/Time  Rate/Dose/Volume Action  08/22/20 0918  25 mcg Given  midazolam (VERSED) injection (mg)  Total dose: 1 mg  Date/Time  Rate/Dose/Volume Action  08/22/20 0919  1 mg Given  Radial Cocktail/Verapamil only (mL)  Total volume: 10 mL  Date/Time  Rate/Dose/Volume Action  08/22/20 0937  10 mL Given  heparin sodium (porcine) injection (Units)  Total dose: 5,000 Units  Date/Time  Rate/Dose/Volume Action  08/22/20 0946  5,000 Units Given  iohexol (OMNIPAQUE) 350 MG/ML injection (mL)  Total volume: 50 mL  Date/Time  Rate/Dose/Volume Action  08/22/20 1000  50 mL Given  Heparin (Porcine) in NaCl 1000-0.9 UT/500ML-% SOLN (mL)  Total volume: 1,000 mL  Date/Time  Rate/Dose/Volume Action  08/22/20 1001  1,000 mL Given  Sedation Time  Sedation Time Physician-1: 38 minutes 58 seconds  Contrast  Medication Name Total Dose  iohexol (OMNIPAQUE) 350 MG/ML injection 50 mL  Radiation/Fluoro  Fluoro time: 5.2 (min)  DAP: 21390 (mGycm2)  Cumulative Air Kerma: 4921.1(mGy)  Complications  Complications documented before study signed (08/22/2020 39:41PM)   No complications were associated with this study.  Documented by HLeonie Man MD - 08/22/2020 2:20 PM  Coronary Findings  Diagnostic  Dominance: Left  Left Main  Vessel was injected. Vessel is large. The vessel is mildly calcified.  Left Anterior Descending  Vessel is small.    Ost LAD to Mid LAD lesion is 15% stenosed. The lesion is concentric. The lesion is severely calcified.  Mid LAD lesion is 50% stenosed. The lesion is eccentric and irregular.  Mid LAD to Dist LAD lesion is 75% stenosed. The lesion is segmental. The lesion was previously treated using a stent (unknown type) over 2 years ago. Previously placed stent displays restenosis.  First Diagonal Branch  Vessel is small in size.  1st Diag lesion is 60% stenosed. The lesion is focal.  First Septal Branch  Vessel is small in size.  Second Diagonal Branch  Vessel is small in size.  Left Anterior Descending  Vessel is small.  Ost LAD to Mid LAD lesion is 15% stenosed. The lesion is concentric. The lesion is severely calcified.  Mid LAD lesion is 50% stenosed. The lesion is eccentric and irregular.  Mid LAD to Dist LAD lesion is 75% stenosed. The lesion is segmental. The lesion was previously treated using a stent (unknown type) over 2 years ago. Previously placed stent displays restenosis.  First Diagonal Branch  Vessel is small in size.  1st Diag lesion is 60% stenosed. The lesion is focal.  First Septal Branch  Vessel is small in size.  Second Diagonal Branch  Vessel is small in size.  Ramus Intermedius  Vessel is normal in caliber. Normal to large in size The vessel is tortuous.  Left Circumflex  Vessel is large.  First Obtuse Marginal Branch  Vessel is small in size. The vessel is tortuous.  Second Obtuse Marginal Branch  The vessel is tortuous.  Left Posterior Descending Artery  Vessel is large in size.  Right Coronary Artery  Vessel is small. Vessel is angiographically normal.  Right Ventricular Branch  Vessel is small in size.  Intervention  No interventions have been documented.  Right Heart  Right Heart Pressures PAP 33/13 mmHg-mean 17 mmHg; PCWP 20-25 mmHg Elevated LV EDP consistent with volume overload. LVP-EDP: 199/7 mmHg - 18 mmHg AoP-R MAP: 174/79 mmHg; 118 mmHg  Right Atrium  Borderline elevated: RA mean 6-7 mmHg  Right Ventricle RVP-EBV 23/0 mmHg-1 mmHg  Wall Motion  Resting    No LV Gram    Left Heart  Aortic Valve There is moderate aortic valve stenosis. By echo, read as severe with mean gradient of 37 mmHg. By cath measurement-mean gradient 18.15 mmHg (likely inaccurate)  Coronary Diagrams  Diagnostic  Dominance: Left   Intervention  Implants     No implant documentation for this case.  Syngo Images  Show images for CARDIAC CATHETERIZATION  Images on Long Term Storage  Show images for Brance, Dartt to Procedure Log    Procedure Log  Hemo Data   Most Recent Value  Fick Cardiac Output 6.38 L/min  Fick Cardiac Output Index 2.95 (L/min)/BSA  Aortic Mean Gradient 18.15 mmHg  Aortic Peak Gradient 24 mmHg  Aortic Valve Area 1.61  Aortic Value Area Index 0.75 cm2/BSA  RA A Wave 9 mmHg  RA V Wave 7 mmHg  RA Mean 6 mmHg  RV Systolic Pressure 23 mmHg  RV Diastolic Pressure 0 mmHg  RV EDP 0 mmHg  PA Systolic Pressure 33 mmHg  PA Diastolic Pressure 13 mmHg  PA Mean 17 mmHg  PW A Wave 25 mmHg  PW V Wave 23 mmHg  PW Mean 18 mmHg  AO Systolic Pressure 419 mmHg  AO Diastolic Pressure 71 mmHg  AO Mean 379 mmHg  LV Systolic Pressure 024 mmHg  LV Diastolic Pressure 7 mmHg  LV EDP 18 mmHg  AOp Systolic Pressure 097 mmHg  AOp Diastolic Pressure 79 mmHg  AOp Mean Pressure 353 mmHg  LVp Systolic Pressure 299 mmHg  LVp Diastolic Pressure 8 mmHg  LVp EDP Pressure 16 mmHg  QP/QS 1  TPVR Index 5.75 HRUI  TSVR Index 34.88 HRUI  TPVR/TSVR Ratio 0.17     ADDENDUM REPORT: 09/15/2020 17:53  CLINICAL DATA: 86 year old male with severe aortic stenosis being  evaluated for a TAVR procedure.  EXAM:  Cardiac TAVR CT  TECHNIQUE:  The patient was scanned on a [employer]. A 120 kV  retrospective scan was triggered in the descending thoracic aorta at  111 HU's. Gantry rotation speed was 250 msecs and collimation was .6  mm. No beta  blockade or nitro were given. The 3D data set was  reconstructed in  5% intervals of the R-R cycle. Systolic and  diastolic phases were analyzed on a dedicated work station using  MPR, MIP and VRT modes. The patient received 80 cc of contrast.  FINDINGS:  Aortic Valve: Trileaflet aortic valve with severely calcified  leaflets and severely restricted leaflet opening. Only trivial  calcifications are extending into the LVOT. Aortic valve calcium  score is 2667 consistent with severe aortic stenosis.  Aorta: Normal size with mild diffuse atherosclerotic plaque and  calcifications and no dissection.  Sinotubular Junction: 29 x 28 mm  Ascending Thoracic Aorta: 32 x 32 mm  Aortic Arch: 28 x 26 mm  Descending Thoracic Aorta: 27 x 27 mm  Sinus of Valsalva Measurements:  Non-coronary: 34 mm  Right -coronary: 34 mm  Left -coronary: 33 mm  Coronary Artery Height above Annulus:  Left Main: 17 mm  Right Coronary: 18 mm  Virtual Basal Annulus Measurements: (Measured at 20%)  Maximum/Minimum Diameter: 27.8 x 23.9 mm  Mean Diameter: 25.4 mm  Perimeter: 81.5 mm  Area: 507 mm2  Optimum Fluoroscopic Angle for Delivery: RAO 12 CRA 5  IMPRESSION:  1. Trileaflet aortic valve with severely calcified leaflets and  severely restricted leaflet opening. Only trivial calcifications are  extending into the LVOT. Aortic valve calcium score is 2667  consistent with severe aortic stenosis. Annular measurements  suitable for delivery of a 26 mm Edwards-SAPIEN 3 Ultra valve.  2. Sufficient coronary to annulus distance.  3. Optimum Fluoroscopic Angle for Delivery: RAO 12 CRA 5  4. No thrombus in the left atrial appendage.  Electronically Signed  By: Ena Dawley  On: 09/15/2020 17:53   Addended by Dorothy Spark, MD on 09/15/2020 5:56 PM  Study Result  Addenda  ADDENDUM REPORT: 09/15/2020 17:53  CLINICAL DATA: 86 year old male with severe aortic stenosis being  evaluated for a TAVR procedure.  EXAM:   Cardiac TAVR CT  TECHNIQUE:  The patient was scanned on a [employer]. A 120 kV  retrospective scan was triggered in the descending thoracic aorta at  111 HU's. Gantry rotation speed was 250 msecs and collimation was .6  mm. No beta blockade or nitro were given. The 3D data set was  reconstructed in 5% intervals of the R-R cycle. Systolic and  diastolic phases were analyzed on a dedicated work station using  MPR, MIP and VRT modes. The patient received 80 cc of contrast.  FINDINGS:  Aortic Valve: Trileaflet aortic valve with severely calcified  leaflets and severely restricted leaflet opening. Only trivial  calcifications are extending into the LVOT. Aortic valve calcium  score is 2667 consistent with severe aortic stenosis.  Aorta: Normal size with mild diffuse atherosclerotic plaque and  calcifications and no dissection.  Sinotubular Junction: 29 x 28 mm  Ascending Thoracic Aorta: 32 x 32 mm  Aortic Arch: 28 x 26 mm  Descending Thoracic Aorta: 27 x 27 mm  Sinus of Valsalva Measurements:  Non-coronary: 34 mm  Right -coronary: 34 mm  Left -coronary: 33 mm  Coronary Artery Height above Annulus:  Left Main: 17 mm  Right Coronary: 18 mm  Virtual Basal Annulus Measurements: (Measured at 20%)  Maximum/Minimum Diameter: 27.8 x 23.9 mm  Mean Diameter: 25.4 mm  Perimeter: 81.5 mm  Area: 507 mm2  Optimum Fluoroscopic Angle for Delivery: RAO 12 CRA 5  IMPRESSION:  1. Trileaflet aortic valve with severely calcified leaflets and  severely restricted leaflet opening. Only trivial calcifications are  extending into the LVOT. Aortic valve calcium score is  2667  consistent with severe aortic stenosis. Annular measurements  suitable for delivery of a 26 mm Edwards-SAPIEN 3 Ultra valve.  2. Sufficient coronary to annulus distance.  3. Optimum Fluoroscopic Angle for Delivery: RAO 12 CRA 5  4. No thrombus in the left atrial appendage.  Electronically Signed  By: Ena Dawley    On: 09/15/2020 17:53   Signed by Dorothy Spark, MD on 09/15/2020 5:56 PM  Narrative & Impression  EXAM:  OVER-READ INTERPRETATION CT CHEST  The following report is an over-read performed by radiologist Dr.  Vinnie Langton of [facility], [place] on 09/13/2020. This  over-read does not include interpretation of cardiac or coronary  anatomy or pathology. The coronary calcium score/coronary CTA  interpretation by the cardiologist is attached.  COMPARISON: None.  FINDINGS:  Extracardiac findings will be described separately under dictation  for contemporaneously obtained CTA chest, abdomen and pelvis.  IMPRESSION:  Please see separate dictation for contemporaneously obtained CTA  chest, abdomen and pelvis dated 09/13/2020 for full description of  relevant extracardiac findings.  Electronically Signed:  By: Vinnie Langton M.D.  On: 09/13/2020 10:27   CLINICAL DATA: 86 year old male with history of severe aortic  stenosis. Preprocedural study prior to potential transcatheter  aortic valve replacement (TAVR) procedure.  EXAM:  CT ANGIOGRAPHY CHEST, ABDOMEN AND PELVIS  TECHNIQUE:  Non-contrast CT of the chest was initially obtained.  Multidetector CT imaging through the chest, abdomen and pelvis was  performed using the standard protocol during bolus administration of  intravenous contrast. Multiplanar reconstructed images and MIPs were  obtained and reviewed to evaluate the vascular anatomy.  CONTRAST: 166m OMNIPAQUE IOHEXOL 350 MG/ML SOLN  COMPARISON: None.  FINDINGS:  CTA CHEST FINDINGS  Cardiovascular: Heart size is mildly enlarged with left atrial  dilatation. There is no significant pericardial fluid, thickening or  pericardial calcification. There is aortic atherosclerosis, as well  as atherosclerosis of the great vessels of the mediastinum and the  coronary arteries, including calcified atherosclerotic plaque in the  left main, left anterior descending, left  circumflex and right  coronary arteries. Severe thickening and calcification of the aortic  valve.  Mediastinum/Lymph Nodes: No pathologically enlarged mediastinal or  hilar lymph nodes. Esophagus is unremarkable in appearance. No  axillary lymphadenopathy.  Lungs/Pleura: Mild elevation of the right hemidiaphragm. No acute  consolidative airspace disease. No pleural effusions. No suspicious  appearing pulmonary nodules or masses are noted.  Musculoskeletal/Soft Tissues: Multiple Schmorl's nodes in the  thoracic spine incidentally noted. There are no aggressive appearing  lytic or blastic lesions noted in the visualized portions of the  skeleton.  CTA ABDOMEN AND PELVIS FINDINGS  Hepatobiliary: No suspicious cystic or solid hepatic lesions. No  intra or extrahepatic biliary ductal dilatation. Gallbladder is  normal in appearance.  Pancreas: No pancreatic mass. No pancreatic ductal dilatation. No  pancreatic or peripancreatic fluid collections or inflammatory  changes.  Spleen: Unremarkable.  Adrenals/Urinary Tract: Multiple low-attenuation lesions are noted  in both kidneys, compatible with simple cysts, largest of which  measure up to 2.9 cm. Other subcentimeter low-attenuation lesions  are noted in both kidneys, too small to characterize, but  statistically likely to represent cysts. Mild bilateral renal  atrophy. Bilateral adrenal glands are normal in appearance. No  hydroureteronephrosis. Urinary bladder is normal in appearance.  Stomach/Bowel: Normal appearance of the stomach. No pathologic  dilatation of small bowel or colon. Numerous colonic diverticulae  are noted, without surrounding inflammatory changes to suggest an  acute diverticulitis at this time.  Normal appendix.  Vascular/Lymphatic: Aortic atherosclerosis, without evidence of  aneurysm or dissection in the abdominal or pelvic vasculature.  Vascular findings and measurements pertinent to potential TAVR  procedure, as  detailed below. No lymphadenopathy noted in the  abdomen or pelvis.  Reproductive: Prostate gland and seminal vesicles are unremarkable  in appearance.  Other: No significant volume of ascites. No pneumoperitoneum.  Musculoskeletal: There are no aggressive appearing lytic or blastic  lesions noted in the visualized portions of the skeleton.  VASCULAR MEASUREMENTS PERTINENT TO TAVR:  AORTA:  Minimal Aortic Diameter-17 x 15 mm  Severity of Aortic Calcification-moderate to severe  RIGHT PELVIS:  Right Common Iliac Artery -  Minimal Diameter-8.4 x 9.0 mm  Tortuosity-mild-to-moderate  Calcification-moderate  Right External Iliac Artery -  Minimal Diameter-8.7 x 8.1 mm  Tortuosity-mild  Calcification-mild  Right Common Femoral Artery -  Minimal Diameter-8.2 x 7.1 mm  Tortuosity-mild  Calcification-mild-to-moderate  LEFT PELVIS:  Left Common Iliac Artery -  Minimal Diameter-9.9 x 11.2 mm  Tortuosity-mild  Calcification-moderate  Left External Iliac Artery -  Minimal Diameter-9.4 x 8.9 mm  Tortuosity-mild  Calcification-none  Left Common Femoral Artery -  Minimal Diameter-8.2 x 8.6 mm  Tortuosity-mild  Calcification-mild-to-moderate  Review of the MIP images confirms the above findings.  IMPRESSION:  1. Vascular findings and measurements pertinent to potential TAVR  procedure, as detailed above.  2. Severe thickening calcification of the aortic valve, compatible  with reported clinical history of severe aortic stenosis.  3. Mild cardiomegaly with left atrial dilatation.  4. Aortic atherosclerosis, in addition to left main and 3 vessel  coronary artery disease.  5. Mild elevation of the right hemidiaphragm.  6. Mild bilateral renal atrophy.  7. Colonic diverticulosis without evidence of acute diverticulitis  at this time.  8. Additional incidental findings, as above.  Electronically Signed  By: Vinnie Langton M.D.  On: 09/13/2020 14:03    STS RISK CALCULATOR:    Isolated aortic valve replacement:  Risk of Mortality:  2.768%  Renal Failure:  3.795%  Permanent Stroke:  2.507%  Prolonged Ventilation:  6.620%  DSW Infection:  0.103%  Reoperation:  5.087%  Morbidity or Mortality:  12.280%  Short Length of Stay:  31.912%  Long Length of Stay:  4.675%    Impression:   This 86 year old gentleman has stage D, severe, symptomatic aortic stenosis with New York Heart Association class III symptoms of exertional fatigue and shortness of breath consistent with chronic diastolic congestive heart failure. I have personally reviewed his 2D echocardiogram, cardiac catheterization, and CTA studies. His echocardiogram shows a trileaflet aortic valve with severe calcification and restriction of the leaflets. The mean gradient is 37 mmHg with a dimensionless index of 0.21 and a valve area of 0.67 cm consistent with severe aortic stenosis. Left ventricular ejection fraction is 50 to 55%. Cardiac catheterization showed diffuse 75% in-stent restenosis throughout the mid LAD as well as a calcific proximal 50 to 60% LAD stenosis. He is not having any anginal symptoms and after discussion with the multidisciplinary heart valve team it was felt that this lesion could be treated medically in this patient. I agree that aortic valve replacement is indicated in this patient with progressive symptoms of severe aortic stenosis. Given his age, prior stroke, and some memory issues I think transcatheter aortic valve replacement would be the best option for him. His gated cardiac CTA shows anatomy suitable for TAVR using a Sapien 3 valve. His abdominal and pelvic CTA shows adequate pelvic vascular anatomy to  allow transfemoral insertion.  The patient and his granddaughter were counseled at length regarding treatment alternatives for management of severe symptomatic aortic stenosis. The risks and benefits of surgical intervention has been discussed in detail. Long-term prognosis with  medical therapy was discussed. Alternative approaches such as conventional surgical aortic valve replacement and CABG, transcatheter aortic valve replacement, and palliative medical therapy were compared and contrasted at length. This discussion was placed in the context of the patient's own specific clinical presentation and past medical history. All of their questions have been addressed.  Following the decision to proceed with transcatheter aortic valve replacement, a discussion was held regarding what types of management strategies would be attempted intraoperatively in the event of life-threatening complications, including whether or not the patient would be considered a candidate for the use of cardiopulmonary bypass and/or conversion to open sternotomy for attempted surgical intervention. The patient is aware of the fact that transient use of cardiopulmonary bypass may be necessary. He is 86 years old but still active and independent. I would consider him a candidate for emergent sternotomy to manage intraoperative complications depending on the situation. The patient has been advised of a variety of complications that might develop including but not limited to risks of death, stroke, paravalvular leak, aortic dissection or other major vascular complications, aortic annulus rupture, device embolization, cardiac rupture or perforation, mitral regurgitation, acute myocardial infarction, arrhythmia, heart block or bradycardia requiring permanent pacemaker placement, congestive heart failure, respiratory failure, renal failure, pneumonia, infection, other late complications related to structural valve deterioration or migration, or other complications that might ultimately cause a temporary or permanent loss of functional independence or other long term morbidity. The patient provides full informed consent for the procedure as described and all questions were answered.    Plan:   Transfemoral transcatheter  aortic valve replacement.   Gaye Pollack, MD

## 2020-09-24 ENCOUNTER — Other Ambulatory Visit: Payer: Self-pay | Admitting: Physician Assistant

## 2020-09-24 ENCOUNTER — Inpatient Hospital Stay (HOSPITAL_COMMUNITY): Payer: Medicare HMO | Admitting: Anesthesiology

## 2020-09-24 ENCOUNTER — Other Ambulatory Visit: Payer: Self-pay

## 2020-09-24 ENCOUNTER — Inpatient Hospital Stay (HOSPITAL_COMMUNITY)
Admission: RE | Admit: 2020-09-24 | Discharge: 2020-09-25 | DRG: 267 | Disposition: A | Discharge status: Home / Self Care | Payer: Medicare HMO | Source: Ambulatory Visit | Attending: Surgery | Admitting: Surgery

## 2020-09-24 ENCOUNTER — Encounter (HOSPITAL_COMMUNITY)
Admission: RE | Disposition: A | Discharge status: Home / Self Care | Payer: Medicare HMO | Source: Ambulatory Visit | Attending: Surgery

## 2020-09-24 ENCOUNTER — Inpatient Hospital Stay (HOSPITAL_COMMUNITY): Payer: Medicare HMO

## 2020-09-24 ENCOUNTER — Encounter (HOSPITAL_COMMUNITY): Payer: Self-pay | Admitting: Cardiovascular Disease

## 2020-09-24 DIAGNOSIS — Z823 Family history of stroke: Secondary | ICD-10-CM | POA: Diagnosis not present

## 2020-09-24 DIAGNOSIS — Z954 Presence of other heart-valve replacement: Secondary | ICD-10-CM | POA: Diagnosis not present

## 2020-09-24 DIAGNOSIS — I10 Essential (primary) hypertension: Secondary | ICD-10-CM | POA: Diagnosis present

## 2020-09-24 DIAGNOSIS — E785 Hyperlipidemia, unspecified: Secondary | ICD-10-CM | POA: Diagnosis present

## 2020-09-24 DIAGNOSIS — Z006 Encounter for examination for normal comparison and control in clinical research program: Secondary | ICD-10-CM | POA: Diagnosis not present

## 2020-09-24 DIAGNOSIS — N400 Enlarged prostate without lower urinary tract symptoms: Secondary | ICD-10-CM | POA: Diagnosis present

## 2020-09-24 DIAGNOSIS — Z8673 Personal history of transient ischemic attack (TIA), and cerebral infarction without residual deficits: Secondary | ICD-10-CM | POA: Diagnosis not present

## 2020-09-24 DIAGNOSIS — I252 Old myocardial infarction: Secondary | ICD-10-CM | POA: Diagnosis not present

## 2020-09-24 DIAGNOSIS — M1990 Unspecified osteoarthritis, unspecified site: Secondary | ICD-10-CM | POA: Diagnosis present

## 2020-09-24 DIAGNOSIS — Z7902 Long term (current) use of antithrombotics/antiplatelets: Secondary | ICD-10-CM | POA: Diagnosis not present

## 2020-09-24 DIAGNOSIS — Z952 Presence of prosthetic heart valve: Secondary | ICD-10-CM

## 2020-09-24 DIAGNOSIS — Z833 Family history of diabetes mellitus: Secondary | ICD-10-CM | POA: Diagnosis not present

## 2020-09-24 DIAGNOSIS — I350 Nonrheumatic aortic (valve) stenosis: Principal | ICD-10-CM | POA: Diagnosis present

## 2020-09-24 DIAGNOSIS — I2510 Atherosclerotic heart disease of native coronary artery without angina pectoris: Secondary | ICD-10-CM | POA: Diagnosis present

## 2020-09-24 DIAGNOSIS — Z955 Presence of coronary angioplasty implant and graft: Secondary | ICD-10-CM | POA: Diagnosis not present

## 2020-09-24 DIAGNOSIS — Z79899 Other long term (current) drug therapy: Secondary | ICD-10-CM

## 2020-09-24 HISTORY — PX: TRANSCATHETER AORTIC VALVE REPLACEMENT, TRANSFEMORAL: SHX6400

## 2020-09-24 HISTORY — DX: Presence of prosthetic heart valve: Z95.2

## 2020-09-24 HISTORY — PX: TEE WITHOUT CARDIOVERSION: SHX5443

## 2020-09-24 LAB — POCT I-STAT 7, (LYTES, BLD GAS, ICA,H+H)
Acid-base deficit: 1 mmol/L (ref 0.0–2.0)
Bicarbonate: 24.4 mmol/L (ref 20.0–28.0)
Calcium, Ion: 1.11 mmol/L — ABNORMAL LOW (ref 1.15–1.40)
HCT: 33 % — ABNORMAL LOW (ref 39.0–52.0)
Hemoglobin: 11.2 g/dL — ABNORMAL LOW (ref 13.0–17.0)
O2 Saturation: 95 %
Potassium: 3.5 mmol/L (ref 3.5–5.1)
Sodium: 144 mmol/L (ref 135–145)
TCO2: 26 mmol/L (ref 22–32)
pCO2 arterial: 43.2 mmHg (ref 32.0–48.0)
pH, Arterial: 7.359 (ref 7.350–7.450)
pO2, Arterial: 81 mmHg — ABNORMAL LOW (ref 83.0–108.0)

## 2020-09-24 LAB — POCT I-STAT, CHEM 8
BUN: 19 mg/dL (ref 8–23)
BUN: 17 mg/dL (ref 8–23)
Calcium, Ion: 1.2 mmol/L (ref 1.15–1.40)
Calcium, Ion: 1.1 mmol/L — ABNORMAL LOW (ref 1.15–1.40)
Chloride: 105 mmol/L (ref 98–111)
Chloride: 107 mmol/L (ref 98–111)
Creatinine, Ser: 0.9 mg/dL (ref 0.61–1.24)
Creatinine, Ser: 0.8 mg/dL (ref 0.61–1.24)
Glucose, Bld: 119 mg/dL — ABNORMAL HIGH (ref 70–99)
Glucose, Bld: 130 mg/dL — ABNORMAL HIGH (ref 70–99)
HCT: 34 % — ABNORMAL LOW (ref 39.0–52.0)
HCT: 32 % — ABNORMAL LOW (ref 39.0–52.0)
Hemoglobin: 11.6 g/dL — ABNORMAL LOW (ref 13.0–17.0)
Hemoglobin: 10.9 g/dL — ABNORMAL LOW (ref 13.0–17.0)
Potassium: 3.5 mmol/L (ref 3.5–5.1)
Potassium: 3.4 mmol/L — ABNORMAL LOW (ref 3.5–5.1)
Sodium: 144 mmol/L (ref 135–145)
Sodium: 144 mmol/L (ref 135–145)
TCO2: 26 mmol/L (ref 22–32)
TCO2: 24 mmol/L (ref 22–32)

## 2020-09-24 LAB — ECHOCARDIOGRAM LIMITED
AR max vel: 1.87 cm2
AV Area VTI: 1.69 cm2
AV Area mean vel: 1.58 cm2
AV Mean grad: 30 mmHg
AV Peak grad: 34 mmHg
Ao pk vel: 2.91 m/s

## 2020-09-24 LAB — ABO/RH: ABO/RH(D): O POS

## 2020-09-24 SURGERY — IMPLANTATION, AORTIC VALVE, TRANSCATHETER, FEMORAL APPROACH
Anesthesia: Monitor Anesthesia Care | Site: Chest

## 2020-09-24 MED ORDER — DEXMEDETOMIDINE HCL IN NACL 200 MCG/50ML IV SOLN: INTRAVENOUS | Status: DC | PRN

## 2020-09-24 MED ORDER — HEPARIN SODIUM (PORCINE) 1000 UNIT/ML IJ SOLN
INTRAMUSCULAR | Status: AC
  Filled 2020-09-24: qty 1

## 2020-09-24 MED ORDER — CALCIUM CARBONATE ANTACID 500 MG PO CHEW: 1.0000 | CHEWABLE_TABLET | Freq: Three times a day (TID) | ORAL | Status: DC | PRN

## 2020-09-24 MED ORDER — TAMSULOSIN HCL 0.4 MG PO CAPS
0.4000 mg | ORAL_CAPSULE | Freq: Every evening | ORAL | Status: DC
  Administered 2020-09-24: 0.4 mg via ORAL
  Filled 2020-09-24: qty 1

## 2020-09-24 MED ORDER — PHENYLEPHRINE 40 MCG/ML (10ML) SYRINGE FOR IV PUSH (FOR BLOOD PRESSURE SUPPORT)
PREFILLED_SYRINGE | INTRAVENOUS | Status: DC | PRN
  Administered 2020-09-24: 80 ug via INTRAVENOUS

## 2020-09-24 MED ORDER — IODIXANOL 320 MG/ML IV SOLN
INTRAVENOUS | Status: DC | PRN
  Administered 2020-09-24: 150 mL

## 2020-09-24 MED ORDER — PHENYLEPHRINE 40 MCG/ML (10ML) SYRINGE FOR IV PUSH (FOR BLOOD PRESSURE SUPPORT)
PREFILLED_SYRINGE | INTRAVENOUS | Status: AC
  Filled 2020-09-24: qty 10

## 2020-09-24 MED ORDER — SODIUM CHLORIDE 0.9 % IV SOLN
1.5000 g | Freq: Two times a day (BID) | INTRAVENOUS | Status: DC
  Administered 2020-09-24 – 2020-09-25 (×2): 1.5 g via INTRAVENOUS
  Filled 2020-09-24 (×5): qty 1.5

## 2020-09-24 MED ORDER — PROPOFOL 10 MG/ML IV BOLUS
INTRAVENOUS | Status: AC
  Filled 2020-09-24: qty 40

## 2020-09-24 MED ORDER — TRAMADOL HCL 50 MG PO TABS: 50.0000 mg | ORAL_TABLET | ORAL | Status: DC | PRN

## 2020-09-24 MED ORDER — ACETAMINOPHEN 325 MG PO TABS
650.0000 mg | ORAL_TABLET | Freq: Four times a day (QID) | ORAL | Status: DC | PRN
  Administered 2020-09-25 (×2): 650 mg via ORAL
  Filled 2020-09-24 (×2): qty 2

## 2020-09-24 MED ORDER — HEPARIN SODIUM (PORCINE) 1000 UNIT/ML IJ SOLN
INTRAMUSCULAR | Status: DC | PRN
  Administered 2020-09-24: 14000 [IU] via INTRAVENOUS

## 2020-09-24 MED ORDER — FLUTICASONE PROPIONATE 50 MCG/ACT NA SUSP
2.0000 | Freq: Every day | NASAL | Status: DC | PRN
  Filled 2020-09-24: qty 16

## 2020-09-24 MED ORDER — CHLORHEXIDINE GLUCONATE 4 % EX LIQD: 60.0000 mL | Freq: Once | CUTANEOUS | Status: DC

## 2020-09-24 MED ORDER — PANTOPRAZOLE SODIUM 40 MG PO TBEC
40.0000 mg | DELAYED_RELEASE_TABLET | Freq: Every day | ORAL | Status: DC
  Administered 2020-09-24 – 2020-09-25 (×2): 40 mg via ORAL
  Filled 2020-09-24 (×2): qty 1

## 2020-09-24 MED ORDER — SODIUM CHLORIDE 0.9 % IV SOLN: INTRAVENOUS | Status: DC

## 2020-09-24 MED ORDER — PHENYLEPHRINE HCL-NACL 20-0.9 MG/250ML-% IV SOLN: 0.0000 ug/min | INTRAVENOUS | Status: DC

## 2020-09-24 MED ORDER — METOPROLOL TARTRATE 12.5 MG HALF TABLET
12.5000 mg | ORAL_TABLET | Freq: Two times a day (BID) | ORAL | Status: DC
  Administered 2020-09-24 – 2020-09-25 (×2): 12.5 mg via ORAL
  Filled 2020-09-24 (×2): qty 1

## 2020-09-24 MED ORDER — LIDOCAINE HCL 1 % IJ SOLN
INTRAMUSCULAR | Status: DC | PRN
  Administered 2020-09-24: 30 mL

## 2020-09-24 MED ORDER — SODIUM CHLORIDE 0.9 % IV SOLN
INTRAVENOUS | Status: AC
  Filled 2020-09-24: qty 1.2

## 2020-09-24 MED ORDER — SODIUM CHLORIDE (PF) 0.9 % IJ SOLN
INTRAMUSCULAR | Status: AC
  Filled 2020-09-24: qty 10

## 2020-09-24 MED ORDER — ONDANSETRON HCL 4 MG/2ML IJ SOLN: 4.0000 mg | Freq: Four times a day (QID) | INTRAMUSCULAR | Status: DC | PRN

## 2020-09-24 MED ORDER — SODIUM CHLORIDE 0.9% FLUSH
3.0000 mL | Freq: Two times a day (BID) | INTRAVENOUS | Status: DC
  Administered 2020-09-25: 3 mL via INTRAVENOUS

## 2020-09-24 MED ORDER — CHLORHEXIDINE GLUCONATE 0.12 % MT SOLN
15.0000 mL | Freq: Once | OROMUCOSAL | Status: AC
  Administered 2020-09-24: 15 mL via OROMUCOSAL
  Filled 2020-09-24: qty 15

## 2020-09-24 MED ORDER — PROPOFOL 500 MG/50ML IV EMUL
INTRAVENOUS | Status: DC | PRN
  Administered 2020-09-24: 5 ug/kg/min via INTRAVENOUS

## 2020-09-24 MED ORDER — SODIUM CHLORIDE 0.9 % IV SOLN
INTRAVENOUS | Status: AC
  Filled 2020-09-24 (×2): qty 1.2

## 2020-09-24 MED ORDER — MORPHINE SULFATE (PF) 2 MG/ML IV SOLN: 1.0000 mg | INTRAVENOUS | Status: DC | PRN

## 2020-09-24 MED ORDER — SODIUM CHLORIDE 0.9 % IV SOLN: 250.0000 mL | INTRAVENOUS | Status: DC | PRN

## 2020-09-24 MED ORDER — LOPERAMIDE HCL 2 MG PO CAPS: 2.0000 mg | ORAL_CAPSULE | Freq: Four times a day (QID) | ORAL | Status: DC | PRN

## 2020-09-24 MED ORDER — FINASTERIDE 5 MG PO TABS
5.0000 mg | ORAL_TABLET | Freq: Every evening | ORAL | Status: DC
  Administered 2020-09-24: 5 mg via ORAL
  Filled 2020-09-24: qty 1

## 2020-09-24 MED ORDER — NITROGLYCERIN IN D5W 200-5 MCG/ML-% IV SOLN: 0.0000 ug/min | INTRAVENOUS | Status: DC

## 2020-09-24 MED ORDER — OXYCODONE HCL 5 MG PO TABS: 5.0000 mg | ORAL_TABLET | ORAL | Status: DC | PRN

## 2020-09-24 MED ORDER — ASPIRIN 81 MG PO CHEW
81.0000 mg | CHEWABLE_TABLET | Freq: Every day | ORAL | Status: DC
  Administered 2020-09-25: 81 mg via ORAL
  Filled 2020-09-24: qty 1

## 2020-09-24 MED ORDER — LIDOCAINE 2% (20 MG/ML) 5 ML SYRINGE
INTRAMUSCULAR | Status: AC
  Filled 2020-09-24: qty 5

## 2020-09-24 MED ORDER — ACETAMINOPHEN 650 MG RE SUPP: 650.0000 mg | Freq: Four times a day (QID) | RECTAL | Status: DC | PRN

## 2020-09-24 MED ORDER — 0.9 % SODIUM CHLORIDE (POUR BTL) OPTIME
TOPICAL | Status: DC | PRN
  Administered 2020-09-24: 1000 mL

## 2020-09-24 MED ORDER — SODIUM CHLORIDE 0.9 % IV SOLN
INTRAVENOUS | Status: DC
  Administered 2020-09-24: 50 mL/h via INTRAVENOUS

## 2020-09-24 MED ORDER — HYDRALAZINE HCL 25 MG PO TABS
25.0000 mg | ORAL_TABLET | Freq: Four times a day (QID) | ORAL | Status: DC
  Administered 2020-09-24: 25 mg via ORAL
  Filled 2020-09-24: qty 1

## 2020-09-24 MED ORDER — VANCOMYCIN HCL IN DEXTROSE 1-5 GM/200ML-% IV SOLN
1000.0000 mg | Freq: Once | INTRAVENOUS | Status: AC
  Administered 2020-09-24: 1000 mg via INTRAVENOUS
  Filled 2020-09-24: qty 200

## 2020-09-24 MED ORDER — LIDOCAINE HCL (PF) 1 % IJ SOLN
INTRAMUSCULAR | Status: AC
  Filled 2020-09-24: qty 30

## 2020-09-24 MED ORDER — DEXMEDETOMIDINE HCL IN NACL 400 MCG/100ML IV SOLN: INTRAVENOUS | Status: DC | PRN

## 2020-09-24 MED ORDER — CLOPIDOGREL BISULFATE 75 MG PO TABS
75.0000 mg | ORAL_TABLET | Freq: Every day | ORAL | Status: DC
  Administered 2020-09-24 – 2020-09-25 (×2): 75 mg via ORAL
  Filled 2020-09-24 (×2): qty 1

## 2020-09-24 MED ORDER — SODIUM CHLORIDE 0.9% FLUSH: 3.0000 mL | INTRAVENOUS | Status: DC | PRN

## 2020-09-24 MED ORDER — LACTATED RINGERS IV SOLN: INTRAVENOUS | Status: DC | PRN

## 2020-09-24 MED ORDER — METOPROLOL TARTRATE 5 MG/5ML IV SOLN
2.5000 mg | INTRAVENOUS | Status: DC | PRN
  Administered 2020-09-24: 2.5 mg via INTRAVENOUS
  Filled 2020-09-24: qty 5

## 2020-09-24 MED ORDER — CHLORHEXIDINE GLUCONATE 4 % EX LIQD: 30.0000 mL | CUTANEOUS | Status: DC

## 2020-09-24 MED ORDER — SODIUM CHLORIDE 0.9 % IV SOLN
INTRAVENOUS | Status: DC | PRN
  Administered 2020-09-24 (×3): 500 mL via INTRAMUSCULAR

## 2020-09-24 MED ORDER — PROTAMINE SULFATE 10 MG/ML IV SOLN
INTRAVENOUS | Status: DC | PRN
  Administered 2020-09-24: 140 mg via INTRAVENOUS

## 2020-09-24 MED ORDER — FENTANYL CITRATE (PF) 250 MCG/5ML IJ SOLN
INTRAMUSCULAR | Status: AC
  Filled 2020-09-24: qty 5

## 2020-09-24 MED ORDER — ATORVASTATIN CALCIUM 10 MG PO TABS
20.0000 mg | ORAL_TABLET | Freq: Every day | ORAL | Status: DC
  Administered 2020-09-24: 20 mg via ORAL
  Filled 2020-09-24: qty 2

## 2020-09-24 MED FILL — Potassium Chloride Inj 2 mEq/ML: INTRAVENOUS | Qty: 40 | Status: AC

## 2020-09-24 MED FILL — Magnesium Sulfate Inj 50%: INTRAMUSCULAR | Qty: 10 | Status: AC

## 2020-09-24 MED FILL — Heparin Sodium (Porcine) Inj 1000 Unit/ML: INTRAMUSCULAR | Qty: 30 | Status: AC

## 2020-09-24 SURGICAL SUPPLY — 93 items
BAG DECANTER FOR FLEXI CONT (MISCELLANEOUS) ×8 IMPLANT
BAG SNAP BAND KOVER 36X36 (MISCELLANEOUS) ×4 IMPLANT
BLADE CLIPPER SURG (BLADE) IMPLANT
BLADE STERNUM SYSTEM 6 (BLADE) IMPLANT
BLADE SURG 10 STRL SS (BLADE) IMPLANT
CABLE ADAPT CONN TEMP 6FT (ADAPTER) ×4 IMPLANT
CANISTER SUCT 3000ML PPV (MISCELLANEOUS) IMPLANT
CATH DIAG EXPO 6F AL1 (CATHETERS) IMPLANT
CATH DIAG EXPO 6F VENT PIG 145 (CATHETERS) ×8 IMPLANT
CATH EXTERNAL FEMALE PUREWICK (CATHETERS) IMPLANT
CATH INFINITI 6F AL2 (CATHETERS) IMPLANT
CATH S G BIP PACING (CATHETERS) ×4 IMPLANT
CHLORAPREP W/TINT 26 (MISCELLANEOUS) ×4 IMPLANT
CLIP VESOCCLUDE MED 24/CT (CLIP) IMPLANT
CLIP VESOCCLUDE SM WIDE 24/CT (CLIP) IMPLANT
CNTNR URN SCR LID CUP LEK RST (MISCELLANEOUS) ×4 IMPLANT
CONT SPEC 4OZ STRL OR WHT (MISCELLANEOUS) ×4
COVER BACK TABLE 80X110 HD (DRAPES) IMPLANT
COVER WAND RF STERILE (DRAPES) ×4 IMPLANT
DECANTER SPIKE VIAL GLASS SM (MISCELLANEOUS) ×4 IMPLANT
DERMABOND ADHESIVE PROPEN (GAUZE/BANDAGES/DRESSINGS) ×4
DERMABOND ADVANCED (GAUZE/BANDAGES/DRESSINGS) ×2
DERMABOND ADVANCED .7 DNX12 (GAUZE/BANDAGES/DRESSINGS) ×2 IMPLANT
DERMABOND ADVANCED .7 DNX6 (GAUZE/BANDAGES/DRESSINGS) ×4 IMPLANT
DEVICE CLOSURE PERCLS PRGLD 6F (VASCULAR PRODUCTS) ×4 IMPLANT
DEVICE INFLATION ATRION QL2530 (MISCELLANEOUS) ×4 IMPLANT
DRAPE INCISE IOBAN 66X45 STRL (DRAPES) IMPLANT
DRSG TEGADERM 4X4.75 (GAUZE/BANDAGES/DRESSINGS) ×8 IMPLANT
ELECT CAUTERY BLADE 6.4 (BLADE) IMPLANT
ELECT REM PT RETURN 9FT ADLT (ELECTROSURGICAL) ×8
ELECTRODE REM PT RTRN 9FT ADLT (ELECTROSURGICAL) ×4 IMPLANT
FELT TEFLON 6X6 (MISCELLANEOUS) ×4 IMPLANT
GAUZE SPONGE 4X4 12PLY STRL (GAUZE/BANDAGES/DRESSINGS) ×4 IMPLANT
GAUZE SPONGE 4X4 12PLY STRL LF (GAUZE/BANDAGES/DRESSINGS) ×4 IMPLANT
GLOVE BIO SURGEON STRL SZ 6.5 (GLOVE) ×6 IMPLANT
GLOVE BIO SURGEON STRL SZ7.5 (GLOVE) IMPLANT
GLOVE BIO SURGEON STRL SZ8 (GLOVE) IMPLANT
GLOVE BIO SURGEONS STRL SZ 6.5 (GLOVE) ×2
GLOVE BIOGEL PI IND STRL 6.5 (GLOVE) ×2 IMPLANT
GLOVE BIOGEL PI INDICATOR 6.5 (GLOVE) ×2
GLOVE EUDERMIC 7 POWDERFREE (GLOVE) IMPLANT
GLOVE ORTHO TXT STRL SZ7.5 (GLOVE) IMPLANT
GOWN STRL REUS W/ TWL LRG LVL3 (GOWN DISPOSABLE) IMPLANT
GOWN STRL REUS W/ TWL XL LVL3 (GOWN DISPOSABLE) ×2 IMPLANT
GOWN STRL REUS W/TWL LRG LVL3 (GOWN DISPOSABLE)
GOWN STRL REUS W/TWL XL LVL3 (GOWN DISPOSABLE) ×2
GUIDEWIRE SAF TJ AMPL .035X180 (WIRE) ×4 IMPLANT
GUIDEWIRE SAFE TJ AMPLATZ EXST (WIRE) ×8 IMPLANT
INSERT FOGARTY SM (MISCELLANEOUS) IMPLANT
KIT BASIN OR (CUSTOM PROCEDURE TRAY) ×4 IMPLANT
KIT HEART LEFT (KITS) ×4 IMPLANT
KIT SUCTION CATH 14FR (SUCTIONS) IMPLANT
KIT TURNOVER KIT B (KITS) ×4 IMPLANT
LOOP VESSEL MAXI BLUE (MISCELLANEOUS) IMPLANT
LOOP VESSEL MINI RED (MISCELLANEOUS) IMPLANT
NS IRRIG 1000ML POUR BTL (IV SOLUTION) ×4 IMPLANT
PACK ENDO MINOR (CUSTOM PROCEDURE TRAY) ×4 IMPLANT
PAD ARMBOARD 7.5X6 YLW CONV (MISCELLANEOUS) ×8 IMPLANT
PAD ELECT DEFIB RADIOL ZOLL (MISCELLANEOUS) ×4 IMPLANT
PENCIL BUTTON HOLSTER BLD 10FT (ELECTRODE) IMPLANT
PERCLOSE PROGLIDE 6F (VASCULAR PRODUCTS) ×8
POSITIONER HEAD DONUT 9IN (MISCELLANEOUS) ×4 IMPLANT
SET MICROPUNCTURE 5F STIFF (MISCELLANEOUS) ×4 IMPLANT
SHEATH 14X36 EDWARDS (SHEATH) ×4 IMPLANT
SHEATH BRITE TIP 7FR 35CM (SHEATH) ×4 IMPLANT
SHEATH PINNACLE 6F 10CM (SHEATH) ×4 IMPLANT
SHEATH PINNACLE 8F 10CM (SHEATH) ×4 IMPLANT
SLEEVE REPOSITIONING LENGTH 30 (MISCELLANEOUS) ×4 IMPLANT
STOPCOCK MORSE 400PSI 3WAY (MISCELLANEOUS) ×8 IMPLANT
SUT ETHIBOND X763 2 0 SH 1 (SUTURE) IMPLANT
SUT GORETEX CV 4 TH 22 36 (SUTURE) IMPLANT
SUT GORETEX CV4 TH-18 (SUTURE) IMPLANT
SUT MNCRL AB 3-0 PS2 18 (SUTURE) IMPLANT
SUT PROLENE 5 0 C 1 36 (SUTURE) IMPLANT
SUT PROLENE 6 0 C 1 30 (SUTURE) IMPLANT
SUT SILK  1 MH (SUTURE) ×2
SUT SILK 1 MH (SUTURE) ×2 IMPLANT
SUT VIC AB 2-0 CT1 27 (SUTURE)
SUT VIC AB 2-0 CT1 TAPERPNT 27 (SUTURE) IMPLANT
SUT VIC AB 2-0 CTX 36 (SUTURE) IMPLANT
SUT VIC AB 3-0 SH 8-18 (SUTURE) IMPLANT
SYR 50ML LL SCALE MARK (SYRINGE) ×4 IMPLANT
SYR BULB IRRIG 60ML STRL (SYRINGE) IMPLANT
SYR MEDRAD MARK V 150ML (SYRINGE) ×4 IMPLANT
TOWEL GREEN STERILE (TOWEL DISPOSABLE) ×8 IMPLANT
TRANSDUCER W/STOPCOCK (MISCELLANEOUS) ×8 IMPLANT
TRAY FOLEY SLVR 14FR TEMP STAT (SET/KITS/TRAYS/PACK) IMPLANT
TUBE SUCT INTRACARD DLP 20F (MISCELLANEOUS) IMPLANT
VALVE 26 ULTRA SAPIEN (Valve) ×4 IMPLANT
VALVE 26 ULTRA SAPIEN KIT (Valve) ×4 IMPLANT
WIRE EMERALD 3MM-J .035X150CM (WIRE) ×4 IMPLANT
WIRE EMERALD 3MM-J .035X260CM (WIRE) ×4 IMPLANT
WIRE TORQFLEX AUST .018X40CM (WIRE) ×4 IMPLANT

## 2020-09-24 NOTE — Interval H&P Note (Signed)
History and Physical Interval Note:  09/24/2020 6:53 AM  Michael Kidd  has presented today for surgery, with the diagnosis of Severe Aortic Stenosis.  The various methods of treatment have been discussed with the patient and family. After consideration of risks, benefits and other options for treatment, the patient has consented to  Procedure(s): TRANSCATHETER AORTIC VALVE REPLACEMENT, TRANSFEMORAL (N/A) TRANSESOPHAGEAL ECHOCARDIOGRAM (TEE) (N/A) as a surgical intervention.  The patient's history has been reviewed, patient examined, no change in status, stable for surgery.  I have reviewed the patient's chart and labs.  Questions were answered to the patient's satisfaction.     Alleen Borne

## 2020-09-24 NOTE — Transfer of Care (Signed)
Immediate Anesthesia Transfer of Care Note  Patient: DAYLAN JUHNKE  Procedure(s) Performed: TRANSCATHETER AORTIC VALVE REPLACEMENT, TRANSFEMORAL - USING EDWARDS SAPIEN 3 ULTRA  VALVE SIZE 26MM (N/A Chest) TRANSESOPHAGEAL ECHOCARDIOGRAM (TEE) (N/A )  Patient Location: Cath Lab  Anesthesia Type:MAC  Level of Consciousness: awake, alert  and drowsy  Airway & Oxygen Therapy: Patient Spontanous Breathing and Patient connected to face mask oxygen  Post-op Assessment: Report given to RN, Post -op Vital signs reviewed and stable and Patient moving all extremities  Post vital signs: Reviewed and stable  Last Vitals:  Vitals Value Taken Time  BP 135/73   Temp    Pulse 66 09/24/20 0957  Resp 20 09/24/20 0957  SpO2 93 % 09/24/20 0957  Vitals shown include unvalidated device data.  Last Pain:  Vitals:   09/24/20 0954  TempSrc:   PainSc: 0-No pain      Patients Stated Pain Goal: 3 (28/20/60 1561)  Complications: No complications documented.

## 2020-09-24 NOTE — Discharge Summary (Signed)
Physician Discharge Summary  Patient ID: Michael Kidd MRN: [id number] DOB/AGE: May 09, 1930 86 y.o.  Admit date: 09/24/2020 Discharge date: 09/25/2020  Admission Diagnoses:  Patient Active Problem List   Diagnosis Date Noted  . Severe aortic stenosis 09/24/2020  . CAD (coronary artery disease)   . Symptomatic severe aortic stenosis with normal ejection fraction 08/15/2020  . Dyspnea on exertion 08/15/2020  . Coronary artery disease with PCI and stenting many years ago 10/19/2019  . Late effect of cerebrovascular accident (CVA) 10/19/2019  . Stroke (HCC) 08/26/2019  . Essential hypertension 08/26/2019  . BPH (benign prostatic hyperplasia) 08/26/2019  . Hyperlipidemia with target LDL less than 70 08/26/2019   Discharge Diagnoses:   Patient Active Problem List   Diagnosis Date Noted  . Severe aortic stenosis 09/24/2020  . S/P TAVR (transcatheter aortic valve replacement) 09/24/2020  . CAD (coronary artery disease)   . Symptomatic severe aortic stenosis with normal ejection fraction 08/15/2020  . Dyspnea on exertion 08/15/2020  . Coronary artery disease with PCI and stenting many years ago 10/19/2019  . Late effect of cerebrovascular accident (CVA) 10/19/2019  . Stroke (HCC) 08/26/2019  . Essential hypertension 08/26/2019  . BPH (benign prostatic hyperplasia) 08/26/2019  . Hyperlipidemia with target LDL less than 70 08/26/2019   Discharged Condition: good  History of Present Illness:  The patient is an 86 year old gentleman with a history of hypertension, coronary artery disease status post remote PCI of the LAD, stroke last year with expressive aphasia that completely resolved, and aortic stenosis. He had an echocardiogram 07/16/2019 at the time of his stroke which showed a trileaflet aortic valve with moderate thickening and calcification. The mean gradient was 24 mmHg with peak gradient of 35 mmHg. Aortic valve area was 0.79 cm. Left ventricular ejection fraction was 55 to  60%. There was no sign of intracardiac thrombus. He was recently seen back by Dr. Bing Matter and in August 2021 reporting worsening exertional dyspnea and fatigue. Repeat echocardiogram on 08/05/2020 showed an increase in the mean aortic valve gradient to 37 mmHg with a peak gradient of 56 mmHg. Aortic valve area was 0.67 cm. Left ventricular ejection fraction was 50 to 55%. It was felt patient may benefit from Transcatheter Aortic Valve Replacement.  He was evaluated by Dr. Laneta Simmers at which time he reported shortness of breath and fatigue with any walking or doing yard work. He has been riding his lawnmower but still gets tired doing that. He denied any chest pressure or pain. He has had no orthopnea or PND. Denies peripheral edema. He denies dizziness and syncope. He lives with his wife at home. They both have some memory issues but has been independent.  Dr. Laneta Simmers felt the patient had stage D, severe, symptomatic aortic stenosis with New York Heart Association class III symptoms of exertional fatigue and shortness of breath consistent with chronic diastolic congestive heart failure.  He was in agreement the patient would benefit from intervention on his Aortic Valve.  His imaging studies were reviewed and he was felt to have anatomy suitable for TAVR using a Sapien 3 valve. His abdominal and pelvic CTA shows adequate pelvic vascular anatomy to allow transfemoral insertion.  The risks and benefits of the procedure were explained to the patient and he was agreeable to proceed.   Hospital Course:   Michael Kidd presented to [facility] on 09/24/2020.  He was taken to the operating room and underwent Transcatheter Aortic Valve Replacement via Percutaneous Transfemoral Approach.  This was done using  an Edwards Sapien 3 Ultra THV Bioprosthetic valve.  The patient tolerated the procedure without difficulty.  Post operative echocardiogram showed on evidence of paravalvular leak.  He remained hemodynamically stable  and was taken to the progressive care unit in stable condition.  The patient did well post operatively.  His catheterization site was clean and dry without evidence of hematoma.  He ambulated 470 ft without difficulty.  He underwent post operative Echocardiogram which did not show evidence of perivalvular leak.  He was hypertensive and resumed on his home regimen of Lisinopril.  He is medically stable for discharge home today.   Significant Diagnostic Studies: cardiac graphics:   Echocardiogram: Preoperative  IMPRESSIONS    1. Left ventricular ejection fraction, by estimation, is 50 to 55%. The  left ventricle has low normal function. The left ventricle has no regional  wall motion abnormalities. There is severe concentric left ventricular  hypertrophy. Left ventricular  diastolic parameters are consistent with Grade I diastolic dysfunction  (impaired relaxation).  2. Abnormal left ventricular global longitudinal strain ( -9.1 %).  3. Right ventricular systolic function is normal. The right ventricular  size is normal. There is normal pulmonary artery systolic pressure.  4. The mitral valve is normal in structure. Trivial mitral valve  regurgitation. No evidence of mitral stenosis.  5. The aortic valve thickened and heavily calcified. Aortic valve  regurgitation is not visualized. Severe low flow low gradient aortic  stenosis is present. AVA, by VTI measures 0.67 cm. Aortic valve mean  gradient 37.0 mmHg. Aortic valve Vmax 3.73 m/s.  SVI 29. DI 0.21  6. The inferior vena cava is normal in size with greater than 50%  respiratory variability, suggesting right atrial pressure of 3 mmHg.   Treatments: surgery:   Procedure:        Transcatheter Aortic Valve Replacement - Percutaneous Transfemoral Approach             Edwards Sapien 3 Ultra THV (size 26 mm, model # [id number], serial # [id number])  Discharge Exam: Blood pressure 131/73, pulse 69, temperature 98 F (36.7 C),  temperature source Oral, resp. rate 16, height 5\' 11"  (1.803 m), weight 95.4 kg, SpO2 94 %.  General appearance: alert, cooperative and no distress Heart: regular rate and rhythm Lungs: clear to auscultation bilaterally Abdomen: soft, non-tender; bowel sounds normal; no masses,  no organomegaly Extremities: extremities normal, atraumatic, no cyanosis or edema Wound: clean and dry, no hematoma present   Discharge Medications:  Allergies as of 09/25/2020   No Known Allergies     Medication List    TAKE these medications   acetaminophen 650 MG CR tablet Commonly known as: TYLENOL Take 325 mg by mouth in the morning and at bedtime.   albuterol 108 (90 Base) MCG/ACT inhaler Commonly known as: VENTOLIN HFA Inhale 2 puffs into the lungs every 6 (six) hours as needed.   aspirin 81 MG chewable tablet Chew 1 tablet (81 mg total) by mouth daily. Start taking on: September 26, 2020   atorvastatin 40 MG tablet Commonly known as: LIPITOR Take 1 tablet (40 mg total) by mouth daily at 6 PM. What changed: how much to take   calcium carbonate 500 MG chewable tablet Commonly known as: TUMS - dosed in mg elemental calcium Chew 1-2 tablets by mouth 3 (three) times daily as needed for indigestion or heartburn.   Clear Eyes Max Redness Relief 0.03-0.5 % Soln Generic drug: Naphazoline-Glycerin Place 1-2 drops into both eyes 3 (three) times daily as  needed (redness relief).   clopidogrel 75 MG tablet Commonly known as: PLAVIX Take 1 tablet (75 mg total) by mouth daily.   finasteride 5 MG tablet Commonly known as: PROSCAR Take 5 mg by mouth every evening.   fluticasone 50 MCG/ACT nasal spray Commonly known as: FLONASE Place 2 sprays into both nostrils daily as needed for allergies or rhinitis.   lisinopril 5 MG tablet Commonly known as: ZESTRIL Take 5 mg by mouth daily.   loperamide 2 MG capsule Commonly known as: IMODIUM Take 2-4 mg by mouth 4 (four) times daily as needed for  diarrhea or loose stools.   metoprolol tartrate 25 MG tablet Commonly known as: LOPRESSOR Take 12.5 mg by mouth 2 (two) times daily.   omeprazole 20 MG capsule Commonly known as: PRILOSEC Take 20 mg by mouth daily.   tamsulosin 0.4 MG Caps capsule Commonly known as: FLOMAX Take 0.4 mg by mouth every evening.       Follow-up Information    Janetta Hora, PA-C. Go on 10/02/2020.   Specialties: Cardiology, Radiology Why: @ 2:30pm. Please arrive at least 10 minutes early Contact information: [address] [place] [address] [phone]               Signed:  Lowella Dandy, PA-C 09/25/2020, 3:52 PM

## 2020-09-24 NOTE — Op Note (Signed)
[facility]   MULTIDISCIPLINARY HEART VALVE TEAM   TAVR OPERATIVE NOTE   Date of Procedure:  09/24/2020  Preoperative Diagnosis: Severe Aortic Stenosis   Postoperative Diagnosis: Same   Procedure:    Transcatheter Aortic Valve Replacement - Percutaneous Transfemoral Approach  Edwards Sapien 3 Ultra THV (size 26 mm, model # 9750TFX, serial # [id number])   Co-Surgeons:  Alleen Borne, MD and Tonny Bollman, MD  Anesthesiologist:  Arta Bruce, MD  Echocardiographer:  Tobias Alexander, MD  Pre-operative Echo Findings:  Severe aortic stenosis  Normal left ventricular systolic function  Post-operative Echo Findings:  No paravalvular leak  Normal/unchanged left ventricular systolic function  BRIEF CLINICAL NOTE AND INDICATIONS FOR SURGERY  86 year old gentleman with history of coronary artery disease status post remote PCI of the LAD, prior stroke approximately 1 year ago, and progressive now severe aortic stenosis.  The patient presents today for TAVR after undergoing complete multidisciplinary heart team evaluation to include echo, right and left heart catheterization, and CTA studies of the heart as well as the chest, abdomen, and pelvis.  After review of all of his studies, we agreed that he is a good candidate for transfemoral TAVR.  During the course of the patient's preoperative work up they have been evaluated comprehensively by a multidisciplinary team of specialists coordinated through the Multidisciplinary Heart Valve Clinic in the [facility].  They have been demonstrated to suffer from symptomatic severe aortic stenosis as noted above. The patient has been counseled extensively as to the relative risks and benefits of all options for the treatment of severe aortic stenosis including long term medical therapy, conventional surgery for aortic valve replacement, and transcatheter aortic valve replacement.  The patient has been  independently evaluated in formal cardiac surgical consultation by Dr Laneta Simmers, who deemed the patient appropriate for TAVR. Based upon review of all of the patient's preoperative diagnostic tests they are felt to be candidate for transcatheter aortic valve replacement using the transfemoral approach as an alternative to conventional surgery.    Following the decision to proceed with transcatheter aortic valve replacement, a discussion has been held regarding what types of management strategies would be attempted intraoperatively in the event of life-threatening complications, including whether or not the patient would be considered a candidate for the use of cardiopulmonary bypass and/or conversion to open sternotomy for attempted surgical intervention.  The patient has been advised of a variety of complications that might develop peculiar to this approach including but not limited to risks of death, stroke, paravalvular leak, aortic dissection or other major vascular complications, aortic annulus rupture, device embolization, cardiac rupture or perforation, acute myocardial infarction, arrhythmia, heart block or bradycardia requiring permanent pacemaker placement, congestive heart failure, respiratory failure, renal failure, pneumonia, infection, other late complications related to structural valve deterioration or migration, or other complications that might ultimately cause a temporary or permanent loss of functional independence or other long term morbidity.  The patient provides full informed consent for the procedure as described and all questions were answered preoperatively.  DETAILS OF THE OPERATIVE PROCEDURE  PREPARATION:   The patient is brought to the operating room on the above mentioned date and central monitoring was established by the anesthesia team including placement of a central venous catheter and radial arterial line. The patient is placed in the supine position on the operating table.   Intravenous antibiotics are administered. The patient is monitored closely throughout the procedure under conscious sedation.  Baseline transthoracic echocardiogram  is performed. The patient's chest, abdomen, both groins, and both lower extremities are prepared and draped in a sterile manner. A time out procedure is performed.   PERIPHERAL ACCESS:   Using ultrasound guidance, femoral arterial and venous access is obtained with placement of 6 Fr sheaths on the left side.  A pigtail diagnostic catheter was passed through the femoral arterial sheath under fluoroscopic guidance into the aortic root.  A temporary transvenous pacemaker catheter was passed through the femoral venous sheath under fluoroscopic guidance into the right ventricle.  The pacemaker was tested to ensure stable lead placement and pacemaker capture. Aortic root angiography was performed in order to determine the optimal angiographic angle for valve deployment.  TRANSFEMORAL ACCESS:  A micropuncture technique is used to access the right femoral artery under fluoroscopic and ultrasound guidance.  2 Perclose devices are deployed at 10' and 2' positions to 'PreClose' the femoral artery. An 8 French sheath is placed and then an Amplatz Superstiff wire is advanced through the sheath. This is changed out for a 14 French transfemoral E-Sheath after progressively dilating over the Superstiff wire.  An AL-2 catheter was used to direct a straight-tip exchange length wire across the native aortic valve into the left ventricle. This was exchanged out for a pigtail catheter and position was confirmed in the LV apex. Simultaneous LV and Ao pressures were recorded.  The pigtail catheter was exchanged for an Amplatz Extra-stiff wire in the LV apex.    BALLOON AORTIC VALVULOPLASTY:  Not performed  TRANSCATHETER HEART VALVE DEPLOYMENT:  An Edwards Sapien 3 transcatheter heart valve (size 26 mm) was prepared and crimped per manufacturer's guidelines, and  the proper orientation of the valve is confirmed on the [employer] delivery system. The valve was advanced through the introducer sheath using normal technique until in an appropriate position in the abdominal aorta beyond the sheath tip. The balloon was then retracted and using the fine-tuning wheel was centered on the valve. The valve was then advanced across the aortic arch using appropriate flexion of the catheter. The valve was carefully positioned across the aortic valve annulus. The Commander catheter was retracted using normal technique. Once final position of the valve has been confirmed by angiographic assessment, the valve is deployed while temporarily holding ventilation and during rapid ventricular pacing to maintain systolic blood pressure < 50 mmHg and pulse pressure < 10 mmHg. The balloon inflation is held for >3 seconds after reaching full deployment volume. Once the balloon has fully deflated the balloon is retracted into the ascending aorta and valve function is assessed using echocardiography. The patient's hemodynamic recovery following valve deployment is good.  The deployment balloon and guidewire are both removed. Echo demostrated acceptable post-procedural gradients, stable mitral valve function, and no aortic insufficiency.    PROCEDURE COMPLETION:  The sheath was removed and femoral artery closure is performed using the 2 previously deployed Perclose devices.  Protamine is administered once femoral arterial repair was complete. The site is clear with no evidence of bleeding or hematoma after the sutures are tightened. The temporary pacemaker and pigtail catheters are removed. Manual hemostasis is used for contralateral femoral arterial hemostasis for the 6 Fr sheath.  The patient tolerated the procedure well and is transported to the recovery area in stable condition. There were no immediate intraoperative complications. All sponge instrument and needle counts are verified  correct at completion of the operation.   The patient received a total of 40.8 mL of intravenous contrast during the procedure.  Tonny Bollman, MD 09/24/2020 9:52 AM

## 2020-09-24 NOTE — Progress Notes (Signed)
@  2209, Dr. Okey Dupre, on-call for Cardiology Service, paged regarding pt's SBP persisting in 170s-180s despite administration of pt's home PO metoprolol dose and administration of PRN IV metoprolol.  @2300 , MD paged again regarding above. Page promptly returned and order received for PO hydralazine. Will administer and continue to monitor.

## 2020-09-24 NOTE — Discharge Instructions (Signed)
ACTIVITY AND EXERCISE °• Daily activity and exercise are an important part of your recovery. People recover at different rates depending on their general health and type of valve procedure. °• Most people recovering from TAVR feel better relatively quickly  °• No lifting, pushing, pulling more than 10 pounds (examples to avoid: groceries, vacuuming, gardening, golfing): °            - For one week with a procedure through the groin. °            - For six weeks for procedures through the chest wall or neck. °NOTE: You will typically see one of our providers 7-14 days after your procedure to discuss WHEN TO RESUME the above activities.  °  °  °DRIVING °• Do not drive until you are seen for follow up and cleared by a provider. Generally, we ask patient to not drive for 1 week after their procedure. °• If you have been told by your doctor in the past that you may not drive, you must talk with him/her before you begin driving again. °  °DRESSING °• Groin site: you may leave the clear dressing over the site for up to one week or until it falls off. °  °HYGIENE °• If you had a femoral (leg) procedure, you may take a shower when you return home. After the shower, pat the site dry. Do NOT use powder, oils or lotions in your groin area until the site has completely healed. °• If you had a chest procedure, you may shower when you return home unless specifically instructed not to by your discharging practitioner. °            - DO NOT scrub incision; pat dry with a towel. °            - DO NOT apply any lotions, oils, powders to the incision. °            - No tub baths / swimming for at least 2 weeks. °• If you notice any fevers, chills, increased pain, swelling, bleeding or pus, please contact your doctor. °  °ADDITIONAL INFORMATION °• If you are going to have an upcoming dental procedure, please contact our office as you will require antibiotics ahead of time to prevent infection on your heart valve.  ° ° °If you have any  questions or concerns you can call the structural heart phone during normal business hours 8am-4pm. If you have an urgent need after hours or weekends please call [phone] to talk to the on call provider for general cardiology. If you have an emergency that requires immediate attention, please call 911.  ° ° °After TAVR Checklist ° °Check  Test Description  ° Follow up appointment in 1-2 weeks  You will see our structural heart physician assistant, Katie Jahziah Simonin. Your incision sites will be checked and you will be cleared to drive and resume all normal activities if you are doing well.    ° 1 month echo and follow up  You will have an echo to check on your new heart valve and be seen back in the office by Katie Mithra Spano. Many times the echo is not read by your appointment time, but Katie will call you later that day or the following day to report your results.  ° Follow up with your primary cardiologist You will need to be seen by your primary cardiologist in the following 3-6 months after your 1 month appointment in the valve   clinic. Often times your Plavix or Aspirin will be discontinued during this time, but this is decided on a case by case basis.   ° 1 year echo and follow up You will have another echo to check on your heart valve after 1 year and be seen back in the office by Katie Cedrica Brune. This your last structural heart visit.  ° Bacterial endocarditis prophylaxis  You will have to take antibiotics for the rest of your life before all dental procedures (even teeth cleanings) to protect your heart valve. Antibiotics are also required before some surgeries. Please check with your cardiologist before scheduling any surgeries. Also, please make sure to tell us if you have a penicillin allergy as you will require an alternative antibiotic.   ° ° °

## 2020-09-24 NOTE — Anesthesia Procedure Notes (Signed)
Date/Time: 09/24/2020 7:35 AM Performed by: Aundria Rud, CRNA Pre-anesthesia Checklist: Patient identified, Emergency Drugs available, Suction available, Patient being monitored and Timeout performed Oxygen Delivery Method: Simple face mask Induction Type: IV induction Placement Confirmation: positive ETCO2 and breath sounds checked- equal and bilateral Dental Injury: Teeth and Oropharynx as per pre-operative assessment

## 2020-09-24 NOTE — Progress Notes (Signed)
Pt arrived to 4e from cath lab. Vitals obtained. Telemetry applied and CCMD notified x2 verifiers. Bilateral groin sites level 0. Palpable pedal pulses bilatertally. Pt on bedrest until 1:30pm. Bed alarm on.

## 2020-09-24 NOTE — Anesthesia Procedure Notes (Signed)
Arterial Line Insertion Start/End10/19/2021 6:50 AM, 09/24/2020 7:00 AM Performed by: Aundria Rud, CRNA  Patient location: Pre-op. Preanesthetic checklist: patient identified, IV checked, site marked, risks and benefits discussed, surgical consent, monitors and equipment checked, pre-op evaluation, timeout performed and anesthesia consent Lidocaine 1% used for infiltration Left, radial was placed Catheter size: 20 G Hand hygiene performed  and maximum sterile barriers used  Allen's test indicative of satisfactory collateral circulation Attempts: 1 Procedure performed without using ultrasound guided technique. Following insertion, dressing applied and Biopatch. Post procedure assessment: normal and unchanged  Patient tolerated the procedure well with no immediate complications.

## 2020-09-24 NOTE — Progress Notes (Signed)
Patient ID: Michael Kidd, male   DOB: July 30, 1930, 86 y.o.   MRN: [id number] TAVR Team Note:  He feels well. Awake and alert, neuro intact Hemodynamically stable. RRR, no murmur Rhythm is sinus brady 56, no heart block postop. Postop ECG sinus 65 with nonspecific changes.  Groin sites look good.

## 2020-09-24 NOTE — Progress Notes (Signed)
  Echocardiogram 2D Echocardiogram has been performed.  Stark Bray Swaim 09/24/2020, 9:30 AM

## 2020-09-24 NOTE — Op Note (Addendum)
[facility]ASCULAR [facility]ENTER   MULTIDISCIPLINARY HEART VALVE TEAM   TAVR OPERATIVE NOTE   Date of Procedure:  09/24/2020  Preoperative Diagnosis: Severe Aortic Stenosis   Postoperative Diagnosis: Same   Procedure:    Transcatheter Aortic Valve Replacement - Percutaneous Right Transfemoral Approach  Edwards Sapien 3 Ultra THV (size 26 mm, model # 9750TFX, serial # [id number])   Co-Surgeons:  Alleen Borne, MD and Tonny Bollman, MD   Anesthesiologist:  Michelene Heady, MD  Echocardiographer:  Eloy End, MD  Pre-operative Echo Findings:  Severe aortic stenosis  Normal left ventricular systolic function  Post-operative Echo Findings:  No paravalvular leak  Normal left ventricular systolic function   BRIEF CLINICAL NOTE AND INDICATIONS FOR SURGERY  This 86 year old gentleman has stage D, severe, symptomatic aortic stenosis with New York Heart Association class III symptoms of exertional fatigue and shortness of breath consistent with chronic diastolic congestive heart failure. I have personally reviewed his 2D echocardiogram, cardiac catheterization, and CTA studies. His echocardiogram shows a trileaflet aortic valve with severe calcification and restriction of the leaflets. The mean gradient is 37 mmHg with a dimensionless index of 0.21 and a valve area of 0.67 cm consistent with severe aortic stenosis. Left ventricular ejection fraction is 50 to 55%. Cardiac catheterization showed diffuse 75% in-stent restenosis throughout the mid LAD as well as a calcific proximal 50 to 60% LAD stenosis. He is not having any anginal symptoms and after discussion with the multidisciplinary heart valve team it was felt that this lesion could be treated medically in this patient. I agree that aortic valve replacement is indicated in this patient with progressive symptoms of severe aortic stenosis. Given his age, prior stroke, and some memory issues I think transcatheter aortic valve replacement would be  the best option for him. His gated cardiac CTA shows anatomy suitable for TAVR using a Sapien 3 valve. His abdominal and pelvic CTA shows adequate pelvic vascular anatomy to allow transfemoral insertion.  The patient and his granddaughter were counseled at length regarding treatment alternatives for management of severe symptomatic aortic stenosis. The risks and benefits of surgical intervention has been discussed in detail. Long-term prognosis with medical therapy was discussed. Alternative approaches such as conventional surgical aortic valve replacement and CABG, transcatheter aortic valve replacement, and palliative medical therapy were compared and contrasted at length. This discussion was placed in the context of the patient's own specific clinical presentation and past medical history. All of their questions have been addressed.  Following the decision to proceed with transcatheter aortic valve replacement, a discussion was held regarding what types of management strategies would be attempted intraoperatively in the event of life-threatening complications, including whether or not the patient would be considered a candidate for the use of cardiopulmonary bypass and/or conversion to open sternotomy for attempted surgical intervention. The patient is aware of the fact that transient use of cardiopulmonary bypass may be necessary. He is 86 years old but still active and independent. I would consider him a candidate for emergent sternotomy to manage intraoperative complications depending on the situation. The patient has been advised of a variety of complications that might develop including but not limited to risks of death, stroke, paravalvular leak, aortic dissection or other major vascular complications, aortic annulus rupture, device embolization, cardiac rupture or perforation, mitral regurgitation, acute myocardial infarction, arrhythmia, heart block or bradycardia requiring permanent pacemaker  placement, congestive heart failure, respiratory failure, renal failure, pneumonia, infection, other late complications related to structural valve deterioration or migration,  or other complications that might ultimately cause a temporary or permanent loss of functional independence or other long term morbidity. The patient provides full informed consent for the procedure as described and all questions were answered.     DETAILS OF THE OPERATIVE PROCEDURE  PREPARATION:    The patient was brought to the operating room on the above mentioned date and appropriate monitoring was established by the anesthesia team. The patient was placed in the supine position on the operating table.  Intravenous antibiotics were administered. The patient was monitored closely throughout the procedure under conscious sedation.  Baseline transthoracic echocardiogram was performed. The patient's abdomen and both groins were prepped and draped in a sterile manner. A time out procedure was performed.   PERIPHERAL ACCESS:    Using the modified Seldinger technique, femoral arterial and venous access was obtained with placement of 6 Fr sheaths on the left side.  A pigtail diagnostic catheter was passed through the left arterial sheath under fluoroscopic guidance into the aortic root.  A temporary transvenous pacemaker catheter was passed through the left femoral venous sheath under fluoroscopic guidance into the right ventricle.  The pacemaker was tested to ensure stable lead placement and pacemaker capture. Aortic root angiography was performed in order to determine the optimal angiographic angle for valve deployment.   TRANSFEMORAL ACCESS:   Percutaneous transfemoral access and sheath placement was performed using ultrasound guidance.  The right common femoral artery was cannulated using a micropuncture needle and appropriate location was verified using hand injection angiogram.  A pair of Abbott Perclose percutaneous  closure devices were placed and a 6 French sheath replaced into the femoral artery.  The patient was heparinized systemically and ACT verified > 250 seconds.    A 14 Fr transfemoral E-sheath was introduced into the right common femoral artery after progressively dilating over an Amplatz superstiff wire. An AL-2 catheter was used to direct a straight-tip exchange length wire across the native aortic valve into the left ventricle. This was exchanged out for a pigtail catheter and position was confirmed in the LV apex. Simultaneous LV and Ao pressures were recorded.  The pigtail catheter was exchanged for an Amplatz Extra-stiff wire in the LV apex.  Echocardiography was utilized to confirm appropriate wire position and no sign of entanglement in the mitral subvalvular apparatus.   BALLOON AORTIC VALVULOPLASTY:   Not performed   TRANSCATHETER HEART VALVE DEPLOYMENT:   An Edwards Sapien 3 Ultra transcatheter heart valve (size 26 mm) was prepared and crimped per manufacturer's guidelines, and the proper orientation of the valve is confirmed on the [employer] delivery system. The valve was advanced through the introducer sheath using normal technique until in an appropriate position in the abdominal aorta beyond the sheath tip. The balloon was then retracted and using the fine-tuning wheel was centered on the valve. The valve was then advanced across the aortic arch using appropriate flexion of the catheter. The valve was carefully positioned across the aortic valve annulus. The Commander catheter was retracted using normal technique. Once final position of the valve has been confirmed by angiographic assessment, the valve is deployed while temporarily holding ventilation and during rapid ventricular pacing to maintain systolic blood pressure < 50 mmHg and pulse pressure < 10 mmHg. The balloon inflation is held for >3 seconds after reaching full deployment volume. Once the balloon has fully deflated the  balloon is retracted into the ascending aorta and valve function is assessed using echocardiography. There is felt to be no  paravalvular leak and no central aortic insufficiency.  The patient's hemodynamic recovery following valve deployment is good.  The deployment balloon and guidewire are both removed.    PROCEDURE COMPLETION:   The sheath was removed and femoral artery closure performed.  Protamine was administered once femoral arterial repair was complete. The temporary pacemaker, pigtail catheters and femoral sheaths were removed with manual pressure used for hemostasis.  Manual pressure was utilized following removal of the diagnostic sheath in the left femoral artery.  The patient tolerated the procedure well and is transported to the cath lab recovery area in stable condition. There were no immediate intraoperative complications. All sponge instrument and needle counts are verified correct at completion of the operation.   No blood products were administered during the operation.  The patient received a total of 40.8 mL of intravenous contrast during the procedure.   Alleen Borne, MD 09/24/2020

## 2020-09-24 NOTE — Progress Notes (Signed)
Mobility Specialist - Progress Note   09/24/20 1650  Mobility  Activity Ambulated in hall  Level of Assistance Standby assist, set-up cues, supervision of patient - no hands on  Assistive Device  (IV stand)  Distance Ambulated (ft) 160 ft  Mobility Response Tolerated fair  Mobility performed by Mobility specialist  $Mobility charge 1 Mobility    Pre-mobility: 61 HR, 144/77 BP, 98% SpO2 During mobility: 78 HR, 96% SpO2 Post-mobility: 63 HR, 162/84 BP, 96% SpO2  Pt's groin sites evaluated by RN prior to ambulation. He stated he did not have an AD when ambulating at home. He ambulated on 2 L of O2. Pt endorsed some weakness around his knees towards of the end of ambulation, which he attributed to his arthritis. Pt assisted back into bed after walk, O2 dec to 1 L as he was at 100% SpO2 on 2 L.   Mamie Levers Mobility Specialist Mobility Specialist Phone: [phone]

## 2020-09-24 NOTE — Anesthesia Postprocedure Evaluation (Signed)
Anesthesia Post Note  Patient: Michael Kidd  Procedure(s) Performed: TRANSCATHETER AORTIC VALVE REPLACEMENT, TRANSFEMORAL - USING EDWARDS SAPIEN 3 ULTRA  VALVE SIZE 26MM (N/A Chest) TRANSESOPHAGEAL ECHOCARDIOGRAM (TEE) (N/A )     Patient location during evaluation: PACU Anesthesia Type: MAC Level of consciousness: awake and alert Pain management: pain level controlled Vital Signs Assessment: post-procedure vital signs reviewed and stable Respiratory status: spontaneous breathing, nonlabored ventilation, respiratory function stable and patient connected to nasal cannula oxygen Cardiovascular status: stable and blood pressure returned to baseline Postop Assessment: no apparent nausea or vomiting Anesthetic complications: no   No complications documented.  Last Vitals:  Vitals:   09/24/20 1230 09/24/20 1300  BP: 132/75 (!) 148/78  Pulse: (!) 49 (!) 50  Resp:    Temp:    SpO2: 99% 98%    Last Pain:  Vitals:   09/24/20 1300  TempSrc:   PainSc: [place] DAVID

## 2020-09-25 ENCOUNTER — Inpatient Hospital Stay (HOSPITAL_COMMUNITY): Payer: Medicare HMO

## 2020-09-25 ENCOUNTER — Encounter (HOSPITAL_COMMUNITY): Payer: Self-pay | Admitting: Cardiovascular Disease

## 2020-09-25 DIAGNOSIS — I2510 Atherosclerotic heart disease of native coronary artery without angina pectoris: Secondary | ICD-10-CM | POA: Diagnosis not present

## 2020-09-25 DIAGNOSIS — I350 Nonrheumatic aortic (valve) stenosis: Secondary | ICD-10-CM | POA: Diagnosis not present

## 2020-09-25 DIAGNOSIS — Z006 Encounter for examination for normal comparison and control in clinical research program: Secondary | ICD-10-CM | POA: Diagnosis not present

## 2020-09-25 DIAGNOSIS — I10 Essential (primary) hypertension: Secondary | ICD-10-CM | POA: Diagnosis not present

## 2020-09-25 DIAGNOSIS — Z952 Presence of prosthetic heart valve: Secondary | ICD-10-CM | POA: Diagnosis not present

## 2020-09-25 DIAGNOSIS — Z954 Presence of other heart-valve replacement: Secondary | ICD-10-CM

## 2020-09-25 LAB — MAGNESIUM: Magnesium: 1.8 mg/dL (ref 1.7–2.4)

## 2020-09-25 LAB — ECHOCARDIOGRAM COMPLETE
AR max vel: 2.78 cm2
AV Area VTI: 2.97 cm2
AV Area mean vel: 2.84 cm2
AV Mean grad: 9 mmHg
AV Peak grad: 16 mmHg
Ao pk vel: 2 m/s
Area-P 1/2: 2.76 cm2
Height: 71 in
S' Lateral: 2.55 cm
Weight: 3365.1 oz

## 2020-09-25 LAB — CBC
HCT: 37.8 % — ABNORMAL LOW (ref 39.0–52.0)
Hemoglobin: 12.8 g/dL — ABNORMAL LOW (ref 13.0–17.0)
MCH: 31.1 pg (ref 26.0–34.0)
MCHC: 33.9 g/dL (ref 30.0–36.0)
MCV: 91.7 fL (ref 80.0–100.0)
Platelets: 167 10*3/uL (ref 150–400)
RBC: 4.12 MIL/uL — ABNORMAL LOW (ref 4.22–5.81)
RDW: 13.6 % (ref 11.5–15.5)
WBC: 10.7 10*3/uL — ABNORMAL HIGH (ref 4.0–10.5)
nRBC: 0 % (ref 0.0–0.2)

## 2020-09-25 LAB — BASIC METABOLIC PANEL
Anion gap: 10 (ref 5–15)
BUN: 14 mg/dL (ref 8–23)
CO2: 24 mmol/L (ref 22–32)
Calcium: 8.5 mg/dL — ABNORMAL LOW (ref 8.9–10.3)
Chloride: 105 mmol/L (ref 98–111)
Creatinine, Ser: 0.99 mg/dL (ref 0.61–1.24)
GFR, Estimated: >60 mL/min (ref >60)
Glucose, Bld: 125 mg/dL — ABNORMAL HIGH (ref 70–99)
Potassium: 3.9 mmol/L (ref 3.5–5.1)
Sodium: 139 mmol/L (ref 135–145)

## 2020-09-25 MED ORDER — ASPIRIN 81 MG PO CHEW
81.0000 mg | CHEWABLE_TABLET | Freq: Every day | ORAL | Status: DC
Start: 2020-09-26 — End: 2021-03-21

## 2020-09-25 MED ORDER — LISINOPRIL 5 MG PO TABS
5.0000 mg | ORAL_TABLET | Freq: Every day | ORAL | Status: DC
  Administered 2020-09-25: 5 mg via ORAL
  Filled 2020-09-25: qty 1

## 2020-09-25 NOTE — Progress Notes (Signed)
Mobility Specialist - Progress Note   09/25/20 1342  Mobility  Activity Ambulated in hall  Level of Assistance Modified independent, requires aide device or extra time  Assistive Device Front wheel walker  Distance Ambulated (ft) 450 ft  Mobility Response Tolerated fair  Mobility performed by Mobility specialist  $Mobility charge 1 Mobility    Pre-mobility: 81 HR During mobility: 90 HR Post-mobility: 80 HR  Pt c/o weakness of his knees while ambulating, otherwise asx. Pt sitting on edge of bed after walk.   Mamie Levers Mobility Specialist Mobility Specialist Phone: [phone]

## 2020-09-25 NOTE — Progress Notes (Signed)
Pt discharged home with family. IVs and telemetry box removed. Pt and pt's family, Dannielle Huh, received discharge instructions and all questions were answered. Pt left with all of his belongings. Pt discharged via wheelchair and was accompanied by a Charity fundraiser.

## 2020-09-25 NOTE — Progress Notes (Addendum)
      [address]       [place] [address]             [phone]      1 Day Post-Op Procedure(s) (LRB): TRANSCATHETER AORTIC VALVE REPLACEMENT, TRANSFEMORAL - USING EDWARDS SAPIEN 3 ULTRA  VALVE SIZE (N/A) TRANSESOPHAGEAL ECHOCARDIOGRAM (TEE) (N/A)   Subjective:  Patient states he doesn't feel that well.  However, he states he always feels like this.  He is short of breath at times too, states he has had to hire someone to do his lawn.  Objective: Vital signs in last 24 hours: Temp:  [96.9 F (36.1 C)-99.6 F (37.6 C)] 99.6 F (37.6 C) (10/20 0355) Pulse Rate:  [49-76] 76 (10/19 2315) Cardiac Rhythm: Normal sinus rhythm (10/20 0355) Resp:  [14-25] 25 (10/20 0355) BP: (81-190)/(48-94) 119/67 (10/20 0355) SpO2:  [89 %-99 %] 95 % (10/20 0355) Weight:  [95.4 kg] 95.4 kg (10/20 0406)  Intake/Output from previous day: 10/19 0701 - 10/20 0700 In: 1980.7 [P.O.:480; I.V.:800.8; IV Piggyback:699.9] Out: 1000 [Urine:1000]  General appearance: alert, cooperative and no distress Heart: regular rate and rhythm Lungs: clear to auscultation bilaterally Abdomen: soft, non-tender; bowel sounds normal; no masses,  no organomegaly Extremities: extremities normal, atraumatic, no cyanosis or edema Wound: clean and dry, no hematoma present  Lab Results: Recent Labs    09/24/20 1020 09/25/20 0208  WBC  --  10.7*  HGB 10.9* 12.8*  HCT 32.0* 37.8*  PLT  --  167   BMET:  Recent Labs    09/24/20 1020 09/25/20 0208  NA 144 139  K 3.4* 3.9  CL 107 105  CO2  --  24  GLUCOSE 130* 125*  BUN 17 14  CREATININE 0.80 0.99  CALCIUM  --  8.5*    PT/INR: No results for input(s): LABPROT, INR in the last 72 hours. ABG    Component Value Date/Time   PHART 7.359 09/24/2020 0856   HCO3 24.4 09/24/2020 0856   TCO2 24 09/24/2020 1020   ACIDBASEDEF 1.0 09/24/2020 0856   O2SAT 95.0 09/24/2020 0856   CBG (last 3)  No results for input(s): GLUCAP in the last 72  hours.  Assessment/Plan: S/P Procedure(s) (LRB): TRANSCATHETER AORTIC VALVE REPLACEMENT, TRANSFEMORAL - USING EDWARDS SAPIEN 3 ULTRA  VALVE SIZE (N/A) TRANSESOPHAGEAL ECHOCARDIOGRAM (TEE) (N/A)  1. CV- NSR, Hypertensive overnight, BP improved this morning- will resume home Zestril, _Plavix, ASA 2. Pulm- shortness of breath, present prior to procedure, should improve with time, lungs are clear, not requiring oxygen 3. Renal- creatinine WNL 4. GU- voiding w/o difficulty 5. Dispo- patient stable, patients current complaints are consistent with his baseline, will restart home Zestril for HTN, Plavix, ASA for TAVR, will get ECHO today, ambulate... if patient is up moving around and Echocardiogram ok, will d/c later today   LOS: 1 day    Lowella Dandy, PA-C 09/25/2020    Chart reviewed, patient examined, agree with above. He was hypertensive overnight but BP ok this am. He ambulated 470 ft this am and complained of SOB and being tired. Sats fine on RA. 2d echo pending. He does have a significant LAD stenosis but has not had any anginal pain so we are treating that medically. Plan home later today.

## 2020-09-25 NOTE — Progress Notes (Signed)
  Echocardiogram 2D Echocardiogram has been performed.  Rogenia Werntz G Shneur [place] 09/25/2020, 11:26 AM

## 2020-09-25 NOTE — Progress Notes (Signed)
Pt ambulated 424ft in hallway using rolling walker. Pt tolerated fair. Stated he was short of breath and felt "tired". O2 sats 91-92% on room air. Returned to room and sats 96% at rest, on room air.

## 2020-09-25 NOTE — Progress Notes (Signed)
Pt has ambulated atleast twice. Discussed walking at home, restrictions, and CRPII. Not interested in CRPII. Helped him order lunch. Feeling well besides pain from shingles (20 years ago). [id number] Michael Kidd CES, ACSM 12:08 PM 09/25/2020

## 2020-09-26 ENCOUNTER — Telehealth: Payer: Self-pay

## 2020-09-26 NOTE — Telephone Encounter (Signed)
Patient contacted regarding discharge from [facility] on 09/25/2020.  Patient understands to follow up with provider Carlean Jews PA-C on 10/02/20 at 2:30 PM at [facility] office. Patient understands discharge instructions? yes Patient understands medications and regiment? yes Patient understands to bring all medications to this visit? Yes  The pt is doing well today and states that he had the best nights sleep in a long time.  The pt does complain of overall soreness in his muscles and I advised that lying on OR table and in hospital bed could contribute to those symptoms, but he should see improvement over the next few days.  I reviewed post TAVR care instructions with the pt and he will contact the office with any additional questions or concerns.

## 2020-10-02 ENCOUNTER — Other Ambulatory Visit: Payer: Self-pay | Admitting: Physician Assistant

## 2020-10-02 ENCOUNTER — Other Ambulatory Visit: Payer: Self-pay

## 2020-10-02 ENCOUNTER — Ambulatory Visit (INDEPENDENT_AMBULATORY_CARE_PROVIDER_SITE_OTHER): Payer: Medicare HMO

## 2020-10-02 ENCOUNTER — Encounter: Payer: Self-pay | Admitting: Physician Assistant

## 2020-10-02 ENCOUNTER — Ambulatory Visit: Payer: Medicare HMO

## 2020-10-02 ENCOUNTER — Ambulatory Visit (INDEPENDENT_AMBULATORY_CARE_PROVIDER_SITE_OTHER): Payer: Medicare HMO | Admitting: Physician Assistant

## 2020-10-02 VITALS — BP 150/84 | HR 76 | Ht 71.0 in | Wt 209.2 lb

## 2020-10-02 DIAGNOSIS — Z952 Presence of prosthetic heart valve: Secondary | ICD-10-CM

## 2020-10-02 DIAGNOSIS — I447 Left bundle-branch block, unspecified: Secondary | ICD-10-CM

## 2020-10-02 DIAGNOSIS — I440 Atrioventricular block, first degree: Secondary | ICD-10-CM

## 2020-10-02 DIAGNOSIS — I10 Essential (primary) hypertension: Secondary | ICD-10-CM

## 2020-10-02 NOTE — Progress Notes (Signed)
[facility] [place]                                       Cardiology Office Note    Date:  10/03/2020   ID:  Michael Kidd, DOB [birth year], MRN [id number]  PCP:  Jene Every, MD  Cardiologist: Dr. Agustin Cree / Dr. Burt Knack & Dr. Cyndia Bent (TAVR)  CC: [facility] s/p TAVR   History of Present Illness:  Michael Kidd is a 86 y.o. male with a history of HTN, CAD s/p remote PCI, HTN, renal cyst, BPH, bilateral knee pain, CVA in 2020 s/p loop recorder with no afib, and severe AS s/p TAVR (09/24/20) who presents to clinic for follow up.   He had an echocardiogram 07/16/2019 at the time of his stroke which showed a trileaflet aortic valve with moderate thickening and calcification. The mean gradient was 24 mmHg with peak gradient of 35 mmHg. Aortic valve area was 0.79 cm. Left ventricular ejection fraction was 55 to 60%. There was no sign of intracardiac thrombus. He was recently seen back by Dr. Agustin Cree and in August 2021 reporting worsening exertional dyspnea and fatigue. Repeat echocardiogram on 08/05/2020 showed an increase in the mean aortic valve gradient to 37 mmHg with a peak gradient of 56 mmHg. Aortic valve area was 0.67 cm. Left ventricular ejection fraction was 50 to 55%. It was felt patient may benefit from Transcatheter Aortic Valve Replacement.  He was evaluated by Dr. Cyndia Bent at which time he reported shortness of breath and fatigue with any walking or doing yard work. Pre TAVR work up with L/RHC revealed patency of the right coronary artery, patency of the left circumflex which is a large vessel, and moderately severe diffuse in-stent restenosis throughout the mid LAD estimated at 75% as well as calcific proximal LAD stenosis estimated at 50 to 60%.The patient's LAD stenosis is hemodynamically significant. Given lack of chest pain, the team favored medical therapy for CAD.  The patient has been evaluated by the multidisciplinary valve team and felt to  have severe, symptomatic aortic stenosis and to be a suitable candidate for TAVR. He underwent successful TAVR with a 26 mm Edwards Sapien 3 THV via the TF approach on 09/24/20. Post operative echo showed EF 60%, normally functioning TAVR with a mean gradient of 9 mmHg and no PVL. He was discharged on aspirin and plavix.   Today he presents to clinic for follow up. Here with wife. Doing well. No CP or SOB. No LE edema, orthopnea or PND. No dizziness or syncope. No blood in stool or urine. No palpitations. He does complain of some weakness but this preceded TAVR . Wants to get back driving again and back on lawn mower.     Past Medical History:  Diagnosis Date  . Arthritis   . Bilateral chronic knee pain   . BPH (benign prostatic hyperplasia)   . CAD (coronary artery disease)    s/p remote LAD PCI  . Dysthymia   . HTN (hypertension)   . Myocardial infarction ([place])   . Renal cyst   . Short of breath on exertion    per patient  . Stroke [facility])    November    Past Surgical History:  Procedure Laterality Date  . CATARACT EXTRACTION W/ INTRAOCULAR LENS  IMPLANT, BILATERAL    . cataract repair    . HERNIA REPAIR  unsure of date  . LOOP RECORDER INSERTION N/A 08/28/2019   Procedure: LOOP RECORDER INSERTION;  Surgeon: Evans Lance, MD;  Location: [facility];  Service: Cardiovascular;  Laterality: N/A;  . PERCUTANEOUS CORONARY STENT INTERVENTION (PCI-S)    . RIGHT/LEFT HEART CATH AND CORONARY ANGIOGRAPHY N/A 08/22/2020   Procedure: RIGHT/LEFT HEART CATH AND CORONARY ANGIOGRAPHY;  Surgeon: Leonie Man, MD;  Location: [facility];  Service: Cardiovascular;  Laterality: N/A;  . TEE WITHOUT CARDIOVERSION N/A 09/24/2020   Procedure: TRANSESOPHAGEAL ECHOCARDIOGRAM (TEE);  Surgeon: Sherren Mocha, MD;  Location: [place];  Service: Open Heart Surgery;  Laterality: N/A;  . TRANSCATHETER AORTIC VALVE REPLACEMENT, TRANSFEMORAL N/A 09/24/2020   Procedure: TRANSCATHETER AORTIC  VALVE REPLACEMENT, TRANSFEMORAL - USING EDWARDS SAPIEN 3 ULTRA  VALVE SIZE 26MM;  Surgeon: Sherren Mocha, MD;  Location: [facility];  Service: Open Heart Surgery;  Laterality: N/A;    Current Medications: Outpatient Medications Prior to Visit  Medication Sig Dispense Refill  . acetaminophen (TYLENOL) 650 MG CR tablet Take 325 mg by mouth in the morning and at bedtime.     Marland Kitchen albuterol (VENTOLIN HFA) 108 (90 Base) MCG/ACT inhaler Inhale 2 puffs into the lungs every 6 (six) hours as needed.     Marland Kitchen aspirin 81 MG chewable tablet Chew 1 tablet (81 mg total) by mouth daily.    Marland Kitchen atorvastatin (LIPITOR) 40 MG tablet Take 1 tablet (40 mg total) by mouth daily at 6 PM. (Patient taking differently: Take 20 mg by mouth daily at 6 PM. ) 30 tablet 11  . calcium carbonate (TUMS - DOSED IN MG ELEMENTAL CALCIUM) 500 MG chewable tablet Chew 1-2 tablets by mouth 3 (three) times daily as needed for indigestion or heartburn.    . clopidogrel (PLAVIX) 75 MG tablet Take 1 tablet (75 mg total) by mouth daily. 30 tablet 11  . finasteride (PROSCAR) 5 MG tablet Take 5 mg by mouth every evening.     . fluticasone (FLONASE) 50 MCG/ACT nasal spray Place 2 sprays into both nostrils daily as needed for allergies or rhinitis.    Marland Kitchen lisinopril (ZESTRIL) 5 MG tablet Take 5 mg by mouth daily.    Marland Kitchen loperamide (IMODIUM) 2 MG capsule Take 2-4 mg by mouth 4 (four) times daily as needed for diarrhea or loose stools.    . metoprolol tartrate (LOPRESSOR) 25 MG tablet Take 12.5 mg by mouth 2 (two) times daily.    . Naphazoline-Glycerin (CLEAR EYES MAX REDNESS RELIEF) 0.03-0.5 % SOLN Place 1-2 drops into both eyes 3 (three) times daily as needed (redness relief).    Marland Kitchen omeprazole (PRILOSEC) 20 MG capsule Take 20 mg by mouth daily.     . tamsulosin (FLOMAX) 0.4 MG CAPS capsule Take 0.4 mg by mouth every evening.      No facility-administered medications prior to visit.     Allergies:   Patient has no known allergies.   Social History    Socioeconomic History  . Marital status: Married    Spouse name: Not on file  . Number of children: Not on file  . Years of education: Not on file  . Highest education level: Not on file  Occupational History  . Not on file  Tobacco Use  . Smoking status: Former Research scientist (life sciences)  . Smokeless tobacco: Never Used  Vaping Use  . Vaping Use: Never used  Substance and Sexual Activity  . Alcohol use: Not Currently  . Drug use: Never  . Sexual activity: Not  on file  Other Topics Concern  . Not on file  Social History Narrative  . Not on file   Social Determinants of Health   Financial Resource Strain:   . Difficulty of Paying Living Expenses: Not on file  Food Insecurity:   . Worried About Charity fundraiser in the Last Year: Not on file  . Ran Out of Food in the Last Year: Not on file  Transportation Needs:   . Lack of Transportation (Medical): Not on file  . Lack of Transportation (Non-Medical): Not on file  Physical Activity:   . Days of Exercise per Week: Not on file  . Minutes of Exercise per Session: Not on file  Stress:   . Feeling of Stress : Not on file  Social Connections:   . Frequency of Communication with Friends and Family: Not on file  . Frequency of Social Gatherings with Friends and Family: Not on file  . Attends Religious Services: Not on file  . Active Member of Clubs or Organizations: Not on file  . Attends Archivist Meetings: Not on file  . Marital Status: Not on file     Family History:  The patient's family history includes Diabetes in his mother; Stroke in his father.     ROS:   Please see the history of present illness.    ROS All other systems reviewed and are negative.   PHYSICAL EXAM:   VS:  BP (!) 150/84   Pulse 76   Ht 5' 11"  (1.803 m)   Wt 209 lb 3.2 oz (94.9 kg)   SpO2 94%   BMI 29.18 kg/m    GEN: Well nourished, well developed, in no acute distress HEENT: normal Neck: no JVD or masses Cardiac: RRR; no murmurs, rubs, or  gallops,no edema  Respiratory:  clear to auscultation bilaterally, normal work of breathing GI: soft, nontender, nondistended, + BS MS: no deformity or atrophy Skin: warm and dry, no rash.  Groin sites clear without hematoma or ecchymosis  Neuro:  Alert and Oriented x 3, Strength and sensation are intact Psych: euthymic mood, full affect   Wt Readings from Last 3 Encounters:  10/02/20 209 lb 3.2 oz (94.9 kg)  09/25/20 210 lb 5.1 oz (95.4 kg)  09/20/20 212 lb 6.4 oz (96.3 kg)      Studies/Labs Reviewed:   EKG:  EKG is ordered today.  The ekg ordered today demonstrates sinus with a new LBBB and 1st deg AV block (QRS 146 ms) and (PR 377m). HR 76 bpm  Recent Labs: 09/20/2020: ALT 21; B Natriuretic Peptide 109.5 09/25/2020: BUN 14; Creatinine, Ser 0.99; Hemoglobin 12.8; Magnesium 1.8; Platelets 167; Potassium 3.9; Sodium 139   Lipid Panel    Component Value Date/Time   CHOL 124 08/27/2019 0532   TRIG 113 08/27/2019 0532   HDL 30 (L) 08/27/2019 0532   CHOLHDL 4.1 08/27/2019 0532   VLDL 23 08/27/2019 0532   LDLCALC 71 08/27/2019 0532    Additional studies/ records that were reviewed today include:   TAVR OPERATIVE NOTE   Date of Procedure:                09/24/2020  Preoperative Diagnosis:      Severe Aortic Stenosis   Postoperative Diagnosis:    Same   Procedure:        Transcatheter Aortic Valve Replacement - Percutaneous Transfemoral Approach             Edwards Sapien 3 Ultra THV (  size 26 mm, model # 9750TFX, serial # [id number])              Co-Surgeons:                        Gaye Pollack, MD and Sherren Mocha, MD  Anesthesiologist:                  Lillia Abed, MD  Echocardiographer:              Ena Dawley, MD  Pre-operative Echo Findings: ? Severe aortic stenosis ? Normal left ventricular systolic function  Post-operative Echo Findings: ? No paravalvular leak ? Normal/unchanged left ventricular systolic  function   ___________________   Echo 09/26/20 IMPRESSIONS  1. Day 1 post TAVR, normal transaortic peak/mean gradients 16/9 mmHg. No  paravalvular leak.  2. Left ventricular ejection fraction, by estimation, is 60 to 65%. The  left ventricle has normal function. The left ventricle has no regional  wall motion abnormalities. There is mild concentric left ventricular  hypertrophy. Left ventricular diastolic  parameters are consistent with Grade I diastolic dysfunction (impaired  relaxation).  3. Right ventricular systolic function is normal. The right ventricular  size is normal. There is normal pulmonary artery systolic pressure. The  estimated right ventricular systolic pressure is 00.7 mmHg.  4. Left atrial size was mildly dilated.  5. The mitral valve is normal in structure. Mild mitral valve  regurgitation. No evidence of mitral stenosis.  6. The aortic valve has been repaired/replaced. Aortic valve  regurgitation is not visualized. No aortic stenosis is present. There is a  26 mm Sapien prosthetic (TAVR) valve present in the aortic position.  Procedure Date: 09/24/2020. Echo findings are  consistent with normal structure and function of the aortic valve  prosthesis. Aortic valve mean gradient measures 9.0 mmHg.  7. The inferior vena cava is normal in size with greater than 50%  respiratory variability, suggesting right atrial pressure of 3 mmHg   ASSESSMENT & PLAN:   Severe AS s/p TAVR: doing okay. Still has some persistent weakness, which preceeded TAVR. Wants to get back to normal activities. Groin sites stable. ECG with new conduction changes but no HAVB ( see below). Continue on aspirin and Plavix. SBE prophylaxis discussed; the patient is edentulous and does not go to the dentist. Will see him back next month for follow up and echo.   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 HR 76 bpm. Given recent TAVR and new conduction disturbance, will place Zio AT to rule out HAVB. No high risk symptoms. Discussed with Dr. KCaryl Comes(electrophysiology) who recommended he stay on low dose Lopressor 12.549mBID.   HTN: BP initially elevated but improved to 135/70 on my personal recheck. No changes made.   Medication Adjustments/Labs and Tests Ordered: Current medicines are reviewed at length with the patient today.  Concerns regarding medicines are outlined above.  Medication changes, Labs and Tests ordered today are listed in the Patient Instructions below. Patient Instructions  Medication Instructions:  No changes *If you need a refill on your cardiac medications before your next appointment, please call your pharmacy*   Lab Work: none If you have labs (blood work) drawn today and your tests are completely normal, you will receive your results only by: . Marland KitchenyChart Message (if you have MyChart) OR . A  paper copy in the mail If you have any lab test that is abnormal or we need to change your treatment, we will call you to review the results.   Testing/Procedures: ZIO AT Long term monitor-Live Telemetry  Your physician has requested you wear a ZIO patch monitor for __ days.  This is a single patch monitor. [employer] supplies one patch monitor per enrollment. Additional stickers are not available.  Please do not apply patch if you will be having a Nuclear Stress Test, Echocardiogram, Cardiac CT, MRI, or Chest Xray during the time frame you would be wearing the monitor. The patch cannot be worn during these tests. You cannot remove and re-apply the ZIO AT patch monitor.   Your ZIO patch monitor will be sent Fed Ex from [employer] directly to your home address. The monitor may also be mailed to a PO BOX if home delivery is not available. It may take 3-5 days to receive your monitor after you have been enrolled.  Once you have received you monitor, please review  enclosed instructions. Your monitor has already been registered assigning a specific monitor serial # to you.   Applying the monitor  Shave hair from upper left chest.  Hold abrader disc by orange tab. Rub abrader in 40 strokes over left upper chest as indicated in your monitor instructions.  Clean area with 4 enclosed alcohol pads. Use all pads to ensure the area is cleaned thoroughly. Let dry.  Apply patch as indicated in monitor instructions. Patch will be placed under collarbone on left side of chest with arrow pointing upward.  Rub patch adhesive wings for 2 minutes. Remove the white label marked "1". Remove the white label marked "2". Rub patch adhesive wings for 2 additional minutes.  While looking in a mirror, press and release button in center of patch. A small green light will flash 3-4 times. This will be your only indicator the monitor has been turned on.  Do not shower for the first 24 hours. You may shower after the first 24 hours.  Press the button if you feel a symptom. You will hear a small click. Record Date, Time and Symptom in the Patient Log.   Starting the Gateway  In your kit there is a Hydrographic surveyor box the size of a cellphone. This is Airline pilot. It transmits all your recorded data to [facility]. This box must stay within 10 feet of you at all times. Open the box and push the * button. There will be a light that blinks orange and then green a few times. When the light stops blinking, the Gateway is connected to the ZIO patch.  Call [employer] at [phone] to confirm your monitor is transmitting.   Returning your monitor  Remove your patch and place it inside the [place]. In the lower half of the Gateway there is a white bag with prepaid postage on it. Place Gateway in bag and seal. Mail package back to [place] as soon as possible. Your physician should have your final report approximately 7 days after you have mailed back your monitor.   Call [place] at [phone] if you have questions regarding your ZIO AT patch monitor. Call them immediately if you see an orange light blinking on your monitor.  If your monitor falls off in less than 4 days contact our Monitor department at [phone]. If your monitor becomes loose or falls off after 4 days call [employer] at [phone] for suggestions on securing your  monitor.    Follow-Up: As planned.  See below.  Other Instructions      Signed, Angelena Form, PA-C  10/03/2020 10:59 AM    [facility] [address], [place], [place]  [address] Phone: [phone]; Fax: [phone]

## 2020-10-02 NOTE — Patient Instructions (Signed)
Medication Instructions:  No changes *If you need a refill on your cardiac medications before your next appointment, please call your pharmacy*   Lab Work: none If you have labs (blood work) drawn today and your tests are completely normal, you will receive your results only by: Marland Kitchen MyChart Message (if you have MyChart) OR . A paper copy in the mail If you have any lab test that is abnormal or we need to change your treatment, we will call you to review the results.   Testing/Procedures: ZIO AT Long term monitor-Live Telemetry  Your physician has requested you wear a ZIO patch monitor for __ days.  This is a single patch monitor. [employer] supplies one patch monitor per enrollment. Additional stickers are not available.  Please do not apply patch if you will be having a Nuclear Stress Test, Echocardiogram, Cardiac CT, MRI, or Chest Xray during the time frame you would be wearing the monitor. The patch cannot be worn during these tests. You cannot remove and re-apply the ZIO AT patch monitor.   Your ZIO patch monitor will be sent Fed Ex from [employer] directly to your home address. The monitor may also be mailed to a PO BOX if home delivery is not available. It may take 3-5 days to receive your monitor after you have been enrolled.  Once you have received you monitor, please review enclosed instructions. Your monitor has already been registered assigning a specific monitor serial # to you.   Applying the monitor  Shave hair from upper left chest.  Hold abrader disc by orange tab. Rub abrader in 40 strokes over left upper chest as indicated in your monitor instructions.  Clean area with 4 enclosed alcohol pads. Use all pads to ensure the area is cleaned thoroughly. Let dry.  Apply patch as indicated in monitor instructions. Patch will be placed under collarbone on left side of chest with arrow pointing upward.  Rub patch adhesive wings for 2 minutes. Remove the white label marked "1".  Remove the white label marked "2". Rub patch adhesive wings for 2 additional minutes.  While looking in a mirror, press and release button in center of patch. A small green light will flash 3-4 times. This will be your only indicator the monitor has been turned on.  Do not shower for the first 24 hours. You may shower after the first 24 hours.  Press the button if you feel a symptom. You will hear a small click. Record Date, Time and Symptom in the Patient Log.   Starting the Gateway  In your kit there is a Hydrographic surveyor box the size of a cellphone. This is Airline pilot. It transmits all your recorded data to [facility]. This box must stay within 10 feet of you at all times. Open the box and push the * button. There will be a light that blinks orange and then green a few times. When the light stops blinking, the Gateway is connected to the ZIO patch.  Call [employer] at [phone] to confirm your monitor is transmitting.   Returning your monitor  Remove your patch and place it inside the [place]. In the lower half of the Gateway there is a white bag with prepaid postage on it. Place Gateway in bag and seal. Mail package back to [place] as soon as possible. Your physician should have your final report approximately 7 days after you have mailed back your monitor.   Call [place] at [phone] if you have  questions regarding your ZIO AT patch monitor. Call them immediately if you see an orange light blinking on your monitor.  If your monitor falls off in less than 4 days contact our Monitor department at [phone]. If your monitor becomes loose or falls off after 4 days call [employer] at [phone] for suggestions on securing your monitor.    Follow-Up: As planned.  See below.  Other Instructions

## 2020-10-09 LAB — CUP PACEART REMOTE DEVICE CHECK
Date Time Interrogation Session: 20211102230455
Implantable Pulse Generator Implant Date: 20200921

## 2020-10-14 ENCOUNTER — Ambulatory Visit (INDEPENDENT_AMBULATORY_CARE_PROVIDER_SITE_OTHER): Payer: Medicare HMO

## 2020-10-14 DIAGNOSIS — I63412 Cerebral infarction due to embolism of left middle cerebral artery: Secondary | ICD-10-CM

## 2020-10-15 NOTE — Progress Notes (Signed)
Carelink Summary Report / Loop Recorder 

## 2020-10-22 ENCOUNTER — Telehealth: Payer: Self-pay | Admitting: *Deleted

## 2020-10-22 NOTE — Telephone Encounter (Signed)
Explained to Mrs. Bonn we had received a call from [facility] stating his ZIO AT monitor was not transmitting.  She stated he had taken it off Sunday and mailed it back.  It was the second of 2 monitors he had worn starting 10/02/2020.  We will await reports on 2 ZIO AT monitors and import for physicians review.

## 2020-10-22 NOTE — Progress Notes (Signed)
[facility]   MULTIDISCIPLINARY HEART VALVE CLINIC                                       Cardiology Office Note    Date:  10/24/2020   ID:  Michael Kidd, DOB [birth year], MRN [id number]  PCP:  Barron Alvine, MD  Cardiologist: Dr. Bing Matter / Dr. Excell Seltzer & Dr. Laneta Simmers (TAVR)  CC: 1 month s/p TAVR   History of Present Illness:  Michael Kidd is a 86 y.o. male with a history of HTN, CAD s/p remote PCI, HTN, renal cyst, BPH, bilateral knee pain, CVA in 2020 s/p loop recorder with no afib, and severe AS s/p TAVR (09/24/20) who presents to clinic for follow up.   He had an echocardiogram 07/16/2019 at the time of his stroke which showed a trileaflet aortic valve with moderate thickening and calcification. The mean gradient was 24 mmHg with peak gradient of 35 mmHg. Aortic valve area was 0.79 cm. Left ventricular ejection fraction was 55 to 60%. He was recently seen back by Dr. Bing Matter and in August 2021 reporting worsening exertional dyspnea and fatigue. Repeat echocardiogram on 08/05/2020 showed an increase in the mean aortic valve gradient to 37 mmHg with a peak gradient of 56 mmHg. Aortic valve area was 0.67 cm. Left ventricular ejection fraction was 50 to 55%. It was felt patient may benefit from Transcatheter Aortic Valve Replacement.  He was evaluated by Dr. Laneta Simmers at which time he reported shortness of breath and fatigue with any walking or doing yard work. Pre TAVR work up with L/RHC revealed patency of the right coronary artery, patency of the left circumflex which is a large vessel, and moderately severe diffuse in-stent restenosis throughout the mid LAD estimated at 75% as well as calcific proximal LAD stenosis estimated at 50 to 60%.The patient's LAD stenosis is hemodynamically significant. Given lack of chest pain, the team favored medical therapy for CAD.  The patient has been evaluated by the multidisciplinary valve team and felt to have severe, symptomatic aortic stenosis  and to be a suitable candidate for TAVR. He underwent successful TAVR with a 26 mm Edwards Sapien 3 THV via the TF approach on 09/24/20. Post operative echo showed EF 60%, normally functioning TAVR with a mean gradient of 9 mmHg and no PVL. He was discharged on his chronic plavix with the addition of aspirin.   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 He also complained of persistent weakness (preceded TAVR). A zio patch was placed to rule out HAVB.  Today he presents to clinic for follow up.Here with his son. He didn't get as much benefit from the TAVR as he hoped, but still feels a bit better. He still hs fatigue, dyspnea and gives out. He is mostly limited by his arthritis. No chest pain. No dizziness or syncope. No blood in stool or urine. Does have some burning with urination and going to see a urologist next week. No orthopnea or pnd.    Past Medical History:  Diagnosis Date  . Arthritis   . Bilateral chronic knee pain   . BPH (benign prostatic hyperplasia)   . CAD (coronary artery disease)    s/p remote LAD PCI  . Dysthymia   . HTN (hypertension)   . Myocardial infarction (HCC)   .  Renal cyst   . Short of breath on exertion    per patient  . Stroke [facility])    November    Past Surgical History:  Procedure Laterality Date  . CATARACT EXTRACTION W/ INTRAOCULAR LENS  IMPLANT, BILATERAL    . cataract repair    . HERNIA REPAIR     unsure of date  . LOOP RECORDER INSERTION N/A 08/28/2019   Procedure: LOOP RECORDER INSERTION;  Surgeon: Marinus Maw, MD;  Location: [facility] INVASIVE CV LAB;  Service: Cardiovascular;  Laterality: N/A;  . PERCUTANEOUS CORONARY STENT INTERVENTION (PCI-S)    . RIGHT/LEFT HEART CATH AND CORONARY ANGIOGRAPHY N/A 08/22/2020   Procedure: RIGHT/LEFT HEART CATH AND CORONARY ANGIOGRAPHY;  Surgeon: Marykay Lex, MD;  Location: [facility] INVASIVE CV LAB;  Service: Cardiovascular;  Laterality: N/A;  . TEE WITHOUT  CARDIOVERSION N/A 09/24/2020   Procedure: TRANSESOPHAGEAL ECHOCARDIOGRAM (TEE);  Surgeon: Tonny Bollman, MD;  Location: [facility] OR;  Service: Open Heart Surgery;  Laterality: N/A;  . TRANSCATHETER AORTIC VALVE REPLACEMENT, TRANSFEMORAL N/A 09/24/2020   Procedure: TRANSCATHETER AORTIC VALVE REPLACEMENT, TRANSFEMORAL - USING EDWARDS SAPIEN 3 ULTRA  VALVE SIZE ;  Surgeon: Tonny Bollman, MD;  Location: [facility] OR;  Service: Open Heart Surgery;  Laterality: N/A;    Current Medications: Outpatient Medications Prior to Visit  Medication Sig Dispense Refill  . acetaminophen (TYLENOL) 650 MG CR tablet Take 325 mg by mouth in the morning and at bedtime.     Marland Kitchen albuterol (VENTOLIN HFA) 108 (90 Base) MCG/ACT inhaler Inhale 2 puffs into the lungs every 6 (six) hours as needed.     Marland Kitchen aspirin 81 MG chewable tablet Chew 1 tablet (81 mg total) by mouth daily.    Marland Kitchen atorvastatin (LIPITOR) 40 MG tablet Take 1 tablet (40 mg total) by mouth daily at 6 PM. 30 tablet 11  . calcium carbonate (TUMS - DOSED IN MG ELEMENTAL CALCIUM) 500 MG chewable tablet Chew 1-2 tablets by mouth 3 (three) times daily as needed for indigestion or heartburn.    . clopidogrel (PLAVIX) 75 MG tablet Take 1 tablet (75 mg total) by mouth daily. 30 tablet 11  . finasteride (PROSCAR) 5 MG tablet Take 5 mg by mouth every evening.     . fluticasone (FLONASE) 50 MCG/ACT nasal spray Place 2 sprays into both nostrils daily as needed for allergies or rhinitis.    Marland Kitchen lisinopril (ZESTRIL) 5 MG tablet Take 5 mg by mouth daily.    Marland Kitchen loperamide (IMODIUM) 2 MG capsule Take 2-4 mg by mouth 4 (four) times daily as needed for diarrhea or loose stools.    . metoprolol tartrate (LOPRESSOR) 25 MG tablet Take 12.5 mg by mouth 2 (two) times daily.    . Naphazoline-Glycerin (CLEAR EYES MAX REDNESS RELIEF) 0.03-0.5 % SOLN Place 1-2 drops into both eyes 3 (three) times daily as needed (redness relief).    Marland Kitchen omeprazole (PRILOSEC) 20 MG capsule Take 20 mg by mouth daily.      . tamsulosin (FLOMAX) 0.4 MG CAPS capsule Take 0.4 mg by mouth every evening.      No facility-administered medications prior to visit.     Allergies:   Patient has no known allergies.   Social History   Socioeconomic History  . Marital status: Married    Spouse name: Not on file  . Number of children: Not on file  . Years of education: Not on file  . Highest education level: Not on file  Occupational History  . Not on  file  Tobacco Use  . Smoking status: Former Games developer  . Smokeless tobacco: Never Used  Vaping Use  . Vaping Use: Never used  Substance and Sexual Activity  . Alcohol use: Not Currently  . Drug use: Never  . Sexual activity: Not on file  Other Topics Concern  . Not on file  Social History Narrative  . Not on file   Social Determinants of Health   Financial Resource Strain:   . Difficulty of Paying Living Expenses: Not on file  Food Insecurity:   . Worried About Programme researcher, broadcasting/film/video in the Last Year: Not on file  . Ran Out of Food in the Last Year: Not on file  Transportation Needs:   . Lack of Transportation (Medical): Not on file  . Lack of Transportation (Non-Medical): Not on file  Physical Activity:   . Days of Exercise per Week: Not on file  . Minutes of Exercise per Session: Not on file  Stress:   . Feeling of Stress : Not on file  Social Connections:   . Frequency of Communication with Friends and Family: Not on file  . Frequency of Social Gatherings with Friends and Family: Not on file  . Attends Religious Services: Not on file  . Active Member of Clubs or Organizations: Not on file  . Attends Banker Meetings: Not on file  . Marital Status: Not on file     Family History:  The patient's family history includes Diabetes in his mother; Stroke in his father.     ROS:   Please see the history of present illness.    ROS All other systems reviewed and are negative.   PHYSICAL EXAM:   VS:  BP (!) 166/82   Pulse (!) 58   Ht 5'  11" (1.803 m)   SpO2 98%   BMI 29.18 kg/m    GEN: Well nourished, well developed, in no acute distress HEENT: normal Neck: no JVD or masses Cardiac: RRR; no murmurs, rubs, or gallops,no edema  Respiratory:  clear to auscultation bilaterally, normal work of breathing GI: soft, nontender, nondistended, + BS MS: no deformity or atrophy Skin: warm and dry, no rash. Neuro:  Alert and Oriented x 3, Strength and sensation are intact Psych: euthymic mood, full affect   Wt Readings from Last 3 Encounters:  10/02/20 209 lb 3.2 oz (94.9 kg)  09/25/20 210 lb 5.1 oz (95.4 kg)  09/20/20 212 lb 6.4 oz (96.3 kg)      Studies/Labs Reviewed:   EKG:  EKG is NOT ordered today.    Recent Labs: 09/20/2020: ALT 21; B Natriuretic Peptide 109.5 09/25/2020: BUN 14; Creatinine, Ser 0.99; Hemoglobin 12.8; Magnesium 1.8; Platelets 167; Potassium 3.9; Sodium 139   Lipid Panel    Component Value Date/Time   CHOL 124 08/27/2019 0532   TRIG 113 08/27/2019 0532   HDL 30 (L) 08/27/2019 0532   CHOLHDL 4.1 08/27/2019 0532   VLDL 23 08/27/2019 0532   LDLCALC 71 08/27/2019 0532    Additional studies/ records that were reviewed today include:   TAVR OPERATIVE NOTE   Date of Procedure:                09/24/2020  Preoperative Diagnosis:      Severe Aortic Stenosis   Postoperative Diagnosis:    Same   Procedure:        Transcatheter Aortic Valve Replacement - Percutaneous Transfemoral Approach  Edwards Sapien 3 Ultra THV (size 26 mm, model # I1735201, serial # [id number])              Co-Surgeons:                        Alleen Borne, MD and Tonny Bollman, MD  Anesthesiologist:                  Arta Bruce, MD  Echocardiographer:              Tobias Alexander, MD  Pre-operative Echo Findings: ? Severe aortic stenosis ? Normal left ventricular systolic function  Post-operative Echo Findings: ? No paravalvular leak ? Normal/unchanged left ventricular systolic  function   ___________________   Echo 09/26/20 IMPRESSIONS  1. Day 1 post TAVR, normal transaortic peak/mean gradients 16/9 mmHg. No  paravalvular leak.  2. Left ventricular ejection fraction, by estimation, is 60 to 65%. The  left ventricle has normal function. The left ventricle has no regional  wall motion abnormalities. There is mild concentric left ventricular  hypertrophy. Left ventricular diastolic  parameters are consistent with Grade I diastolic dysfunction (impaired  relaxation).  3. Right ventricular systolic function is normal. The right ventricular  size is normal. There is normal pulmonary artery systolic pressure. The  estimated right ventricular systolic pressure is 27.2 mmHg.  4. Left atrial size was mildly dilated.  5. The mitral valve is normal in structure. Mild mitral valve  regurgitation. No evidence of mitral stenosis.  6. The aortic valve has been repaired/replaced. Aortic valve  regurgitation is not visualized. No aortic stenosis is present. There is a  26 mm Sapien prosthetic (TAVR) valve present in the aortic position.  Procedure Date: 09/24/2020. Echo findings are  consistent with normal structure and function of the aortic valve  prosthesis. Aortic valve mean gradient measures 9.0 mmHg.  7. The inferior vena cava is normal in size with greater than 50%  respiratory variability, suggesting right atrial pressure of 3 mmHg  ___________________  Echo 10/24/20 IMPRESSIONS  1. Left ventricular ejection fraction, by estimation, is 55 to 60%. The left ventricle has normal function. The left ventricle has no regional wall motion abnormalities. Left ventricular diastolic parameters are consistent with Grade I diastolic  dysfunction (impaired relaxation).  2. Right ventricular systolic function is normal. The right ventricular size is normal. There is normal pulmonary artery systolic pressure.  3. Left atrial size was mildly dilated.  4. Right atrial  size was mildly dilated.  5. The mitral valve is normal in structure. Mild mitral valve regurgitation.  6. Normally functioning TAVR . The aortic valve has been repaired/replaced. Aortic valve regurgitation is not visualized. Procedure Date: 09/24/2020.   ASSESSMENT & PLAN:   Severe AS s/p TAVR: echo today shows EF 55%, normally functioning TAVR with a mean gradient of 16 mmHg and no PVL. He has NYHA class II symptom, with some mild dyspnea and fatigue. He is mostly limited by his arthritis.  SBE prophylaxis discussed; the patient is edentulous and does not go to the dentist. Continue ASA and Plavix. He had previously been on monotherapy with plavix. His aspirin can be discontinued after 6 months of therapy. I will see him back in 1 year with an echo.   New LBBB and 1st deg AV block: Zio patch full report still pending but no high risk alerts or symptoms concerning for HAVB.   HTN: BP better on personal recheck. No changes made  CAD: pre TAVR L/RHC revealed patency of the right coronary artery, patency of the left circumflex which is a large vessel, and moderately severe diffuse in-stent restenosis throughout the mid LAD estimated at 75% as well as calcific proximal LAD stenosis estimated at 50 to 60%.The patient's LAD stenosis is hemodynamically significant. Given lack of chest pain, the multidisciplinary valve team favored medical therapy for CAD.   Medication Adjustments/Labs and Tests Ordered: Current medicines are reviewed at length with the patient today.  Concerns regarding medicines are outlined above.  Medication changes, Labs and Tests ordered today are listed in the Patient Instructions below. Patient Instructions  Medication Instructions:  1) you may STOP ASPIRIN March 25, 2021 (6 months out from your procedure) *If you need a refill on your cardiac medications before your next appointment, please call your pharmacy*   Follow-Up: Please keep your appointment as scheduled with  Dr. Bing Matter in February.  We will call you to arrange your one year TAVR visits.    Signed, Cline Crock, PA-C  10/24/2020 8:39 PM    [facility] [address] [place], [place], [place]  [address] Phone: [phone]; Fax: [phone]

## 2020-10-24 ENCOUNTER — Ambulatory Visit (HOSPITAL_COMMUNITY): Payer: Medicare HMO | Attending: Cardiovascular Disease

## 2020-10-24 ENCOUNTER — Ambulatory Visit (INDEPENDENT_AMBULATORY_CARE_PROVIDER_SITE_OTHER): Payer: Medicare HMO | Admitting: Physician Assistant

## 2020-10-24 ENCOUNTER — Other Ambulatory Visit: Payer: Self-pay

## 2020-10-24 ENCOUNTER — Encounter: Payer: Self-pay | Admitting: Physician Assistant

## 2020-10-24 ENCOUNTER — Other Ambulatory Visit: Payer: Self-pay | Admitting: Physician Assistant

## 2020-10-24 VITALS — BP 166/82 | HR 58 | Ht 71.0 in

## 2020-10-24 DIAGNOSIS — I459 Conduction disorder, unspecified: Secondary | ICD-10-CM | POA: Diagnosis not present

## 2020-10-24 DIAGNOSIS — I10 Essential (primary) hypertension: Secondary | ICD-10-CM

## 2020-10-24 DIAGNOSIS — I2510 Atherosclerotic heart disease of native coronary artery without angina pectoris: Secondary | ICD-10-CM

## 2020-10-24 DIAGNOSIS — Z952 Presence of prosthetic heart valve: Secondary | ICD-10-CM | POA: Insufficient documentation

## 2020-10-24 LAB — ECHOCARDIOGRAM COMPLETE
AR max vel: 0.81 cm2
AV Area VTI: 0.83 cm2
AV Area mean vel: 0.76 cm2
AV Mean grad: 16 mmHg
AV Peak grad: 25.4 mmHg
Ao pk vel: 2.52 m/s
Area-P 1/2: 2.3 cm2
S' Lateral: 3.3 cm

## 2020-10-24 NOTE — Patient Instructions (Signed)
Medication Instructions:  1) you may STOP ASPIRIN March 25, 2021 (6 months out from your procedure) *If you need a refill on your cardiac medications before your next appointment, please call your pharmacy*   Follow-Up: Please keep your appointment as scheduled with Dr. Bing Matter in February.  We will call you to arrange your one year TAVR visits.

## 2020-10-28 ENCOUNTER — Other Ambulatory Visit: Payer: Self-pay | Admitting: Internal Medicine

## 2020-10-28 ENCOUNTER — Telehealth: Payer: Self-pay | Admitting: Cardiovascular Disease

## 2020-10-28 NOTE — Telephone Encounter (Signed)
Left message for patient that I did not call him and no not see any documentation of anyone else calling him either.  Informed him whoever called will call back if needed.

## 2020-10-28 NOTE — Telephone Encounter (Signed)
rx refill

## 2020-10-28 NOTE — Telephone Encounter (Signed)
Patient wife states she is returning a call from this morning about a chest x- ray. I did not see any notes.

## 2020-10-30 ENCOUNTER — Ambulatory Visit: Payer: Medicare HMO | Admitting: Physician Assistant

## 2020-10-30 ENCOUNTER — Other Ambulatory Visit (HOSPITAL_COMMUNITY): Payer: Medicare HMO

## 2020-11-11 DIAGNOSIS — M1990 Unspecified osteoarthritis, unspecified site: Secondary | ICD-10-CM | POA: Insufficient documentation

## 2020-11-11 DIAGNOSIS — I10 Essential (primary) hypertension: Secondary | ICD-10-CM | POA: Insufficient documentation

## 2020-11-11 DIAGNOSIS — R0602 Shortness of breath: Secondary | ICD-10-CM | POA: Insufficient documentation

## 2020-11-12 ENCOUNTER — Encounter: Payer: Self-pay | Admitting: Cardiology

## 2020-11-12 ENCOUNTER — Ambulatory Visit: Payer: Medicare HMO | Admitting: Cardiology

## 2020-11-12 ENCOUNTER — Other Ambulatory Visit: Payer: Self-pay | Admitting: *Deleted

## 2020-11-12 ENCOUNTER — Other Ambulatory Visit: Payer: Self-pay

## 2020-11-12 VITALS — BP 142/90 | HR 52 | Ht 71.0 in | Wt 212.0 lb

## 2020-11-12 DIAGNOSIS — I10 Essential (primary) hypertension: Secondary | ICD-10-CM | POA: Diagnosis not present

## 2020-11-12 DIAGNOSIS — Z8673 Personal history of transient ischemic attack (TIA), and cerebral infarction without residual deficits: Secondary | ICD-10-CM | POA: Diagnosis not present

## 2020-11-12 DIAGNOSIS — I447 Left bundle-branch block, unspecified: Secondary | ICD-10-CM

## 2020-11-12 DIAGNOSIS — I472 Ventricular tachycardia: Secondary | ICD-10-CM

## 2020-11-12 DIAGNOSIS — I4729 Other ventricular tachycardia: Secondary | ICD-10-CM

## 2020-11-12 DIAGNOSIS — Z952 Presence of prosthetic heart valve: Secondary | ICD-10-CM | POA: Diagnosis not present

## 2020-11-12 DIAGNOSIS — I259 Chronic ischemic heart disease, unspecified: Secondary | ICD-10-CM

## 2020-11-12 HISTORY — DX: Other ventricular tachycardia: I47.29

## 2020-11-12 HISTORY — DX: Ventricular tachycardia: I47.2

## 2020-11-12 MED ORDER — RANOLAZINE ER 500 MG PO TB12: 500.0000 mg | ORAL_TABLET | Freq: Two times a day (BID) | ORAL | 2 refills | Status: DC

## 2020-11-12 NOTE — Addendum Note (Signed)
Addended by: Hazle Quant on: 11/12/2020 03:32 PM   Modules accepted: Orders

## 2020-11-12 NOTE — Patient Instructions (Signed)
Medication Instructions:  Your physician has recommended you make the following change in your medication:  START: Ranolazine 500 mg twice daily   *If you need a refill on your cardiac medications before your next appointment, please call your pharmacy*   Lab Work: Your physician recommends that you return for lab work today: bmp, pro bnp  If you have labs (blood work) drawn today and your tests are completely normal, you will receive your results only by: Marland Kitchen MyChart Message (if you have MyChart) OR . A paper copy in the mail If you have any lab test that is abnormal or we need to change your treatment, we will call you to review the results.   Testing/Procedures: None.   Follow-Up: At [facility], you and your health needs are our priority.  As part of our continuing mission to provide you with exceptional heart care, we have created designated Provider Care Teams.  These Care Teams include your primary Cardiologist (physician) and Advanced Practice Providers (APPs -  Physician Assistants and Nurse Practitioners) who all work together to provide you with the care you need, when you need it.  We recommend signing up for the patient portal called "MyChart".  Sign up information is provided on this After Visit Summary.  MyChart is used to connect with patients for Virtual Visits (Telemedicine).  Patients are able to view lab/test results, encounter notes, upcoming appointments, etc.  Non-urgent messages can be sent to your provider as well.   To learn more about what you can do with MyChart, go to [email or web address].    Your next appointment:   1 month(s)  The format for your next appointment:   In Person  Provider:   Gypsy Balsam, MD   Other Instructions  Ranolazine tablets, extended release What is this medicine? RANOLAZINE (ra NOE la zeen) is a heart medicine. It is used to treat chronic chest pain (angina). This medicine must be taken regularly. It will not  relieve an acute episode of chest pain. This medicine may be used for other purposes; ask your health care provider or pharmacist if you have questions. COMMON BRAND NAME(S): Ranexa What should I tell my health care provider before I take this medicine? They need to know if you have any of these conditions:  heart disease  irregular heartbeat  kidney disease  liver disease  low levels of potassium or magnesium in the blood  an unusual or allergic reaction to ranolazine, other medicines, foods, dyes, or preservatives  pregnant or trying to get pregnant  breast-feeding How should I use this medicine? Take this medicine by mouth with a glass of water. Follow the directions on the prescription label. Do not cut, crush, or chew this medicine. Take with or without food. Do not take this medication with grapefruit juice. Take your doses at regular intervals. Do not take your medicine more often then directed. Talk to your pediatrician regarding the use of this medicine in children. Special care may be needed. Overdosage: If you think you have taken too much of this medicine contact a poison control center or emergency room at once. NOTE: This medicine is only for you. Do not share this medicine with others. What if I miss a dose? If you miss a dose, take it as soon as you can. If it is almost time for your next dose, take only that dose. Do not take double or extra doses. What may interact with this medicine? Do not take this medicine with any of  the following medications:  antivirals for HIV or AIDS  cerivastatin  certain antibiotics like chloramphenicol, clarithromycin, dalfopristin; quinupristin, isoniazid, rifabutin, rifampin, rifapentine  certain medicines used for cancer like imatinib, nilotinib  certain medicines for fungal infections like fluconazole, itraconazole, ketoconazole, posaconazole, voriconazole  certain medicines for irregular heart beat like  dronedarone  certain medicines for seizures like carbamazepine, fosphenytoin, oxcarbazepine, phenobarbital, phenytoin  cisapride  conivaptan  cyclosporine  grapefruit or grapefruit juice  lumacaftor; ivacaftor  nefazodone  pimozide  quinacrine  St John's wort  thioridazine This medicine may also interact with the following medications:  alfuzosin  certain medicines for depression, anxiety, or psychotic disturbances like bupropion, citalopram, fluoxetine, fluphenazine, paroxetine, perphenazine, risperidone, sertraline, trifluoperazine  certain medicines for cholesterol like atorvastatin, lovastatin, simvastatin  certain medicines for stomach problems like octreotide, palonosetron, prochlorperazine  eplerenone  ergot alkaloids like dihydroergotamine, ergonovine, ergotamine, methylergonovine  metformin  nicardipine  other medicines that prolong the QT interval (cause an abnormal heart rhythm) like dofetilide, ziprasidone  sirolimus  tacrolimus This list may not describe all possible interactions. Give your health care provider a list of all the medicines, herbs, non-prescription drugs, or dietary supplements you use. Also tell them if you smoke, drink alcohol, or use illegal drugs. Some items may interact with your medicine. What should I watch for while using this medicine? Visit your doctor for regular check ups. Tell your doctor or healthcare professional if your symptoms do not start to get better or if they get worse. This medicine will not relieve an acute attack of angina or chest pain. This medicine can change your heart rhythm. Your health care provider may check your heart rhythm by ordering an electrocardiogram (ECG) while you are taking this medicine. You may get drowsy or dizzy. Do not drive, use machinery, or do anything that needs mental alertness until you know how this medicine affects you. Do not stand or sit up quickly, especially if you are an older  patient. This reduces the risk of dizzy or fainting spells. Alcohol may interfere with the effect of this medicine. Avoid alcoholic drinks. If you are scheduled for any medical or dental procedure, tell your healthcare provider that you are taking this medicine. This medicine can interact with other medicines used during surgery. What side effects may I notice from receiving this medicine? Side effects that you should report to your doctor or health care professional as soon as possible:  allergic reactions like skin rash, itching or hives, swelling of the face, lips, or tongue  breathing problems  changes in vision  fast, irregular or pounding heartbeat  feeling faint or lightheaded, falls  low or high blood pressure  numbness or tingling feelings  ringing in the ears  tremor or shakiness  slow heartbeat (fewer than 50 beats per minute)  swelling of the legs or feet Side effects that usually do not require medical attention (report to your doctor or health care professional if they continue or are bothersome):  constipation  drowsy  dry mouth  headache  nausea or vomiting  stomach upset This list may not describe all possible side effects. Call your doctor for medical advice about side effects. You may report side effects to FDA at 1-800-FDA-1088. Where should I keep my medicine? Keep out of the reach of children. Store at room temperature between 15 and 30 degrees C (59 and 86 degrees F). Throw away any unused medicine after the expiration date. NOTE: This sheet is a summary. It may not cover all  possible information. If you have questions about this medicine, talk to your doctor, pharmacist, or health care provider.  2020 Elsevier/Gold Standard (2018-11-15 09:18:49)

## 2020-11-12 NOTE — Progress Notes (Signed)
Cardiology Office Note:    Date:  11/12/2020   ID:  Michael Kidd, DOB 17-May-1930, MRN [id number]  PCP:  Barron Alvine, MD  Cardiologist:  Gypsy Balsam, MD    Referring MD: Barron Alvine, MD   Chief Complaint  Patient presents with  . Shortness of Breath  . Fatigue    History of Present Illness:    Michael Kidd is a 86 y.o. male with complex past medical history that include coronary artery disease, remote PCI, essential hypertension, BPH, remote history of CVA, severe aortic stenosis status post TAVI done on 09/24/2020.  New left bundle branch block with first-degree AV block.  He comes today 2 months for follow-up.  Also the reason for the visit is the fact that monitor that he will show some runs of nonsustained ventricular tachycardia.  He is disappointed with aortic valve replacement he said he does not feel any better he still describes fatigue tiredness as well as shortness of breath he blames this on his age he said him simply getting old.  Denies have any chest pain tightness squeezing pressure burning chest.  Past Medical History:  Diagnosis Date  . Allergic rhinitis 03/29/2014  . Aortic valve stenosis 05/23/2020  . Arthralgia 07/15/2016  . Arthritis   . Benign prostatic hyperplasia 02/14/2014  . Benign prostatic hyperplasia with urinary frequency 11/02/2018  . Bilateral chronic knee pain   . BPH (benign prostatic hyperplasia)   . CAD (coronary artery disease)    s/p remote LAD PCI  . Chronic ischemic heart disease 08/13/2010  . Chronic pain of both knees 09/01/2017  . Coronary artery disease with PCI and stenting many years ago 10/19/2019  . Depression 02/14/2014   Formatting of this note might be different from the original. PHQ-9 completed 02/26/14 PHQ-2 completed 02/14/14  . Dizziness 03/12/2014  . Dupuytren's contracture 07/15/2016  . Dyspnea on exertion 08/15/2020  . Dysthymia   . Essential hypertension 03/12/2014  . Flexor tendon bowstring 01/06/2016  . Gastroesophageal  reflux disease 08/18/2016  . Hepatic steatosis 03/02/2014  . History of CVA (cerebrovascular accident) 10/12/2019  . HLD (hyperlipidemia) 02/14/2014  . HTN (hypertension)   . Late effect of cerebrovascular accident (CVA) 10/19/2019  . Melanoma in situ of right upper extremity including shoulder (HCC) 08/07/2016   Formatting of this note might be different from the original. August 2017.  Dr. Chancy Milroy, [facility].  . Murmur 05/23/2020  . Myocardial infarction (HCC)   . Neuropathic pain, leg, bilateral 11/23/2017  . Pure hypercholesterolemia 09/14/2018  . Quadriceps weakness 09/01/2017  . Renal cyst   . Risk for falls 10/01/2016  . S/P TAVR (transcatheter aortic valve replacement) 09/24/2020  . Short of breath on exertion    per patient  . Stroke [facility])    November  . Symptomatic severe aortic stenosis with normal ejection fraction 08/15/2020    Past Surgical History:  Procedure Laterality Date  . CATARACT EXTRACTION W/ INTRAOCULAR LENS  IMPLANT, BILATERAL    . cataract repair    . HERNIA REPAIR     unsure of date  . LOOP RECORDER INSERTION N/A 08/28/2019   Procedure: LOOP RECORDER INSERTION;  Surgeon: Marinus Maw, MD;  Location: [facility] INVASIVE CV LAB;  Service: Cardiovascular;  Laterality: N/A;  . PERCUTANEOUS CORONARY STENT INTERVENTION (PCI-S)    . RIGHT/LEFT HEART CATH AND CORONARY ANGIOGRAPHY N/A 08/22/2020   Procedure: RIGHT/LEFT HEART CATH AND CORONARY ANGIOGRAPHY;  Surgeon: Marykay Lex, MD;  Location: [facility] INVASIVE CV LAB;  Service: Cardiovascular;  Laterality: N/A;  . TEE WITHOUT CARDIOVERSION N/A 09/24/2020   Procedure: TRANSESOPHAGEAL ECHOCARDIOGRAM (TEE);  Surgeon: Tonny Bollman, MD;  Location: [facility] OR;  Service: Open Heart Surgery;  Laterality: N/A;  . TRANSCATHETER AORTIC VALVE REPLACEMENT, TRANSFEMORAL N/A 09/24/2020   Procedure: TRANSCATHETER AORTIC VALVE REPLACEMENT, TRANSFEMORAL - USING EDWARDS SAPIEN 3 ULTRA  VALVE SIZE ;  Surgeon: Tonny Bollman, MD;   Location: [facility] OR;  Service: Open Heart Surgery;  Laterality: N/A;    Current Medications: Current Meds  Medication Sig  . acetaminophen (TYLENOL) 650 MG CR tablet Take 325 mg by mouth in the morning and at bedtime.   Marland Kitchen albuterol (VENTOLIN HFA) 108 (90 Base) MCG/ACT inhaler Inhale 2 puffs into the lungs every 6 (six) hours as needed.   Marland Kitchen aspirin 81 MG chewable tablet Chew 1 tablet (81 mg total) by mouth daily.  Marland Kitchen atorvastatin (LIPITOR) 40 MG tablet Take 1 tablet (40 mg total) by mouth daily at 6 PM.  . calcium carbonate (TUMS - DOSED IN MG ELEMENTAL CALCIUM) 500 MG chewable tablet Chew 1-2 tablets by mouth 3 (three) times daily as needed for indigestion or heartburn.  . clopidogrel (PLAVIX) 75 MG tablet Take 1 tablet by mouth once daily  . finasteride (PROSCAR) 5 MG tablet Take 5 mg by mouth every evening.   . fluticasone (FLONASE) 50 MCG/ACT nasal spray Place 2 sprays into both nostrils daily as needed for allergies or rhinitis.  Marland Kitchen lisinopril (ZESTRIL) 5 MG tablet Take 5 mg by mouth daily.  Marland Kitchen loperamide (IMODIUM) 2 MG capsule Take 2-4 mg by mouth 4 (four) times daily as needed for diarrhea or loose stools.  . metoprolol tartrate (LOPRESSOR) 25 MG tablet Take 12.5 mg by mouth 2 (two) times daily.  . Naphazoline-Glycerin (CLEAR EYES MAX REDNESS RELIEF) 0.03-0.5 % SOLN Place 1-2 drops into both eyes 3 (three) times daily as needed (redness relief).  Marland Kitchen omeprazole (PRILOSEC) 20 MG capsule Take 20 mg by mouth daily.   . tamsulosin (FLOMAX) 0.4 MG CAPS capsule Take 0.4 mg by mouth every evening.      Allergies:   Patient has no known allergies.   Social History   Socioeconomic History  . Marital status: Married    Spouse name: Not on file  . Number of children: Not on file  . Years of education: Not on file  . Highest education level: Not on file  Occupational History  . Not on file  Tobacco Use  . Smoking status: Former Games developer  . Smokeless tobacco: Never Used  Vaping Use  . Vaping Use:  Never used  Substance and Sexual Activity  . Alcohol use: Not Currently  . Drug use: Never  . Sexual activity: Not on file  Other Topics Concern  . Not on file  Social History Narrative  . Not on file   Social Determinants of Health   Financial Resource Strain:   . Difficulty of Paying Living Expenses: Not on file  Food Insecurity:   . Worried About Programme researcher, broadcasting/film/video in the Last Year: Not on file  . Ran Out of Food in the Last Year: Not on file  Transportation Needs:   . Lack of Transportation (Medical): Not on file  . Lack of Transportation (Non-Medical): Not on file  Physical Activity:   . Days of Exercise per Week: Not on file  . Minutes of Exercise per Session: Not on file  Stress:   . Feeling of Stress : Not on file  Social Connections:   . Frequency of Communication with Friends and Family: Not on file  . Frequency of Social Gatherings with Friends and Family: Not on file  . Attends Religious Services: Not on file  . Active Member of Clubs or Organizations: Not on file  . Attends Banker Meetings: Not on file  . Marital Status: Not on file     Family History: The patient's family history includes Diabetes in his mother; Stroke in his father. ROS:   Please see the history of present illness.    All 14 point review of systems negative except as described per history of present illness  EKGs/Labs/Other Studies Reviewed:      Recent Labs: 09/20/2020: ALT 21; B Natriuretic Peptide 109.5 09/25/2020: BUN 14; Creatinine, Ser 0.99; Hemoglobin 12.8; Magnesium 1.8; Platelets 167; Potassium 3.9; Sodium 139  Recent Lipid Panel    Component Value Date/Time   CHOL 124 08/27/2019 0532   TRIG 113 08/27/2019 0532   HDL 30 (L) 08/27/2019 0532   CHOLHDL 4.1 08/27/2019 0532   VLDL 23 08/27/2019 0532   LDLCALC 71 08/27/2019 0532    Physical Exam:    VS:  BP (!) 142/90 (BP Location: Left Arm, Patient Position: Sitting)   Pulse (!) 52   Ht 5\' 11"  (1.803 m)    Wt 212 lb (96.2 kg)   SpO2 95%   BMI 29.57 kg/m     Wt Readings from Last 3 Encounters:  11/12/20 212 lb (96.2 kg)  10/02/20 209 lb 3.2 oz (94.9 kg)  09/25/20 210 lb 5.1 oz (95.4 kg)     GEN:  Well nourished, well developed in no acute distress HEENT: Normal NECK: No JVD; No carotid bruits LYMPHATICS: No lymphadenopathy CARDIAC: RRR, soft systolic ejection murmur grade 1/6 best heard right upper portion of the sternum, no rubs, no gallops RESPIRATORY:  Clear to auscultation without rales, wheezing or rhonchi  ABDOMEN: Soft, non-tender, non-distended MUSCULOSKELETAL:  No edema; No deformity  SKIN: Warm and dry LOWER EXTREMITIES: no swelling NEUROLOGIC:  Alert and oriented x 3 PSYCHIATRIC:  Normal affect   ASSESSMENT:    1. S/P TAVR (transcatheter aortic valve replacement)   2. Chronic ischemic heart disease   3. Primary hypertension   4. History of CVA (cerebrovascular accident)   5. Nonsustained ventricular tachycardia (HCC)    PLAN:    In order of problems listed above:  1. Status post Tavi: Valve functioning properly in spite of that he still complaining of having weakness fatigue. 2. Chronic ischemic heart disease.  He did have coronary artery disease on the cardiac cath before aortic valve replacement.  I will maximize therapy for his ischemic heart disease and see if he feels any better.  I will initiate ranolazine 500 mg twice daily. 3. Essential hypertension: Blood pressure well controlled continue present management. 4. History of CVA.  Noted. 5. Nonsustained ventricular tachycardia only short episodes, normal ejection fraction, he is on beta-blocker which I will continue. 6. Left bundle branch block with first-degree AV block I will not increase beta-blocker.  We will continue monitoring.  He does have implantable loop recorder.   Medication Adjustments/Labs and Tests Ordered: Current medicines are reviewed at length with the patient today.  Concerns regarding  medicines are outlined above.  No orders of the defined types were placed in this encounter.  Medication changes: No orders of the defined types were placed in this encounter.   Signed, 09/27/20, MD, [facility] 11/12/2020 3:25 PM  [facility]

## 2020-11-13 ENCOUNTER — Telehealth: Payer: Self-pay | Admitting: Cardiology

## 2020-11-13 ENCOUNTER — Telehealth: Payer: Self-pay | Admitting: Emergency Medicine

## 2020-11-13 DIAGNOSIS — Z79899 Other long term (current) drug therapy: Secondary | ICD-10-CM

## 2020-11-13 LAB — BASIC METABOLIC PANEL
BUN/Creatinine Ratio: 19 (ref 10–24)
BUN: 19 mg/dL (ref 8–27)
CO2: 24 mmol/L (ref 20–29)
Calcium: 9.1 mg/dL (ref 8.6–10.2)
Chloride: 106 mmol/L (ref 96–106)
Creatinine, Ser: 0.99 mg/dL (ref 0.76–1.27)
GFR calc Af Amer: 78 mL/min/{1.73_m2} (ref >59)
GFR calc non Af Amer: 68 mL/min/{1.73_m2} (ref >59)
Glucose: 107 mg/dL — ABNORMAL HIGH (ref 65–99)
Potassium: 4.4 mmol/L (ref 3.5–5.2)
Sodium: 143 mmol/L (ref 134–144)

## 2020-11-13 LAB — PRO B NATRIURETIC PEPTIDE: NT-Pro BNP: 598 pg/mL — ABNORMAL HIGH (ref 0–486)

## 2020-11-13 MED ORDER — FUROSEMIDE 20 MG PO TABS: 20.0000 mg | ORAL_TABLET | Freq: Every day | ORAL | 1 refills | Status: DC

## 2020-11-13 NOTE — Telephone Encounter (Signed)
Michael Kidd's wife is returning Hayley's call in regards to his lab results. Please advise.

## 2020-11-13 NOTE — Telephone Encounter (Signed)
Patient wife informed of results please see additional encounter and result note.

## 2020-11-13 NOTE — Telephone Encounter (Signed)
-----   Message from Georgeanna Lea, MD sent at 11/13/2020  8:56 AM EST ----- Minimal elevation of markers from congestive heart failure.  Please add Lasix 20 mg to his medical regiment, lets check Chem-7 within next week

## 2020-11-16 LAB — CUP PACEART REMOTE DEVICE CHECK
Date Time Interrogation Session: 20211203230623
Implantable Pulse Generator Implant Date: 20200921

## 2020-11-18 ENCOUNTER — Ambulatory Visit (INDEPENDENT_AMBULATORY_CARE_PROVIDER_SITE_OTHER): Payer: Medicare HMO

## 2020-11-18 DIAGNOSIS — I63412 Cerebral infarction due to embolism of left middle cerebral artery: Secondary | ICD-10-CM | POA: Diagnosis not present

## 2020-11-25 ENCOUNTER — Telehealth: Payer: Self-pay | Admitting: Cardiology

## 2020-11-25 NOTE — Telephone Encounter (Signed)
° ° °  Pt c/o medication issue:  1. Name of Medication:   ranolazine (RANEXA) 500 MG 12 hr tablet    2. How are you currently taking this medication (dosage and times per day)? Take 1 tablet (500 mg total) by mouth 2 (two) times daily.  3. Are you having a reaction (difficulty breathing--STAT)?   4. What is your medication issue? Pt's wife said since pt started taking this medication pt's dizziness got worst. She would like to speak with a nurse to discuss

## 2020-11-25 NOTE — Telephone Encounter (Signed)
Called patient informed his wife per dpr to have patient hold ranexa and see if that helps with his dizziness. She will let us know.

## 2020-11-25 NOTE — Telephone Encounter (Signed)
Called patient wife per dpr. She reports since the patient was started on ranexa he has been dizzy. This has been going on for the past few days. He denies a headache or blurred vision. Blood pressure today was 129/62, and heart rate 47. Previous heart rates in office are in the 50s. He still takes lasix 20 mg daily and metoprolol tartrate 12.5 mg twice daily. Will consult with DOD for further instruction.

## 2020-11-25 NOTE — Telephone Encounter (Signed)
Please have him hold the Ranexa for now and see if the symptoms improve.

## 2020-11-25 NOTE — Telephone Encounter (Signed)
Called patient wife back. She wanted to know if he still needed lab work, I looked back and informed her yes. She will have him come this week. No further questions.

## 2020-11-25 NOTE — Telephone Encounter (Signed)
Left message for patient to return call.

## 2020-11-25 NOTE — Telephone Encounter (Signed)
Follow Up:    Pt's wife talked to you earlier today, says she needs to ask you something else.

## 2020-11-27 LAB — BASIC METABOLIC PANEL
BUN/Creatinine Ratio: 18 (ref 10–24)
BUN: 21 mg/dL (ref 8–27)
CO2: 25 mmol/L (ref 20–29)
Calcium: 8.7 mg/dL (ref 8.6–10.2)
Chloride: 104 mmol/L (ref 96–106)
Creatinine, Ser: 1.17 mg/dL (ref 0.76–1.27)
GFR calc Af Amer: 64 mL/min/{1.73_m2} (ref >59)
GFR calc non Af Amer: 55 mL/min/{1.73_m2} — ABNORMAL LOW (ref >59)
Glucose: 109 mg/dL — ABNORMAL HIGH (ref 65–99)
Potassium: 4.4 mmol/L (ref 3.5–5.2)
Sodium: 144 mmol/L (ref 134–144)

## 2020-11-28 ENCOUNTER — Telehealth: Payer: Self-pay | Admitting: Cardiology

## 2020-11-28 NOTE — Telephone Encounter (Signed)
Pt c/o medication issue:  1. Name of Medication: ranolazine (RANEXA) 500 MG 12 hr tablet  2. How are you currently taking this medication (dosage and times per day)? Twice daily  3. Are you having a reaction (difficulty breathing--STAT)? yes  4. What is your medication issue? dizziness   STAT if patient feels like he/she is going to faint   1) Are you dizzy now? A little  2) Do you feel faint or have you passed out? no  3) Do you have any other symptoms? Lethargic, no energy   4) Have you checked your HR and BP (record if available)?  HR 44, 45    Wife is concerned that he may be taking too much medication.

## 2020-11-28 NOTE — Telephone Encounter (Signed)
Pt's wife reports that he has been off of the Ranexa for 2 to 3 days and can see very little change. Pt's wife states that he sleeps a lot and does not have any energy. She also would like to know if he needs to continue the lasix in view of the most recent labs.

## 2020-12-02 ENCOUNTER — Telehealth: Payer: Self-pay | Admitting: Cardiology

## 2020-12-02 NOTE — Telephone Encounter (Signed)
Results reviewed with pt as per Dr. Krasowski's note.  Pt verbalized understanding and had no additional questions. Routed to PCP  

## 2020-12-02 NOTE — Telephone Encounter (Signed)
Patients wife called stating she's returning a call from [facility]. Please advise.

## 2020-12-03 NOTE — Progress Notes (Signed)
Carelink Summary Report / Loop Recorder 

## 2020-12-04 ENCOUNTER — Telehealth: Payer: Self-pay | Admitting: Cardiology

## 2020-12-04 NOTE — Telephone Encounter (Signed)
He can stop it but if he will not do swelling of lower extremities shortness of breath or weight gain he need to started back

## 2020-12-04 NOTE — Telephone Encounter (Signed)
Pt c/o medication issue:  1. Name of Medication: furosemide (LASIX) 20 MG tablet  2. How are you currently taking this medication (dosage and times per day)? As directed  3. Are you having a reaction (difficulty breathing--STAT)? No   4. What is your medication issue? Patient's wife states the patient is not having issues with fluid retention and she would like to know if he needs to continue taking the medication. Please advise.

## 2020-12-04 NOTE — Telephone Encounter (Signed)
Spoke to patient just now and let them know Dr. Vanetta Shawl recommendations. He verbalizes understanding and thanks me for the call back.

## 2020-12-09 NOTE — Telephone Encounter (Signed)
Called patient wife per dpr. Informed her to have him to continue lasix. Advised her that he can stop ranexa if it isn't helping him. She understood.

## 2020-12-09 NOTE — Telephone Encounter (Signed)
Lasix should be continued, if ranolazine does not help he can stop it

## 2020-12-12 ENCOUNTER — Telehealth: Payer: Self-pay | Admitting: Cardiology

## 2020-12-12 NOTE — Telephone Encounter (Signed)
Called and spoke to patient wife. She reports patient came off ranolazine because he was feeling bad but turns out he had a uti and that is why he felt bad. She wants to know if Dr. Bing Matter wants him to go back on ranolazine.

## 2020-12-12 NOTE — Telephone Encounter (Signed)
ask questions about the patient having UTI and if he should still take his heart medication or not

## 2020-12-13 NOTE — Telephone Encounter (Signed)
If he feels ok, no need to go on Ranolazine

## 2020-12-13 NOTE — Telephone Encounter (Signed)
Left message for patient to return call.

## 2020-12-13 NOTE — Telephone Encounter (Signed)
Called and spoke to patient wife per dpr. Informed her if he feels fine he doesn't need to go on ranolazine. She verbally understood no further questions.

## 2020-12-13 NOTE — Telephone Encounter (Signed)
Follow Up:     Pt is returning your call. 

## 2020-12-20 DIAGNOSIS — I219 Acute myocardial infarction, unspecified: Secondary | ICD-10-CM | POA: Insufficient documentation

## 2020-12-20 DIAGNOSIS — M25561 Pain in right knee: Secondary | ICD-10-CM | POA: Insufficient documentation

## 2020-12-20 DIAGNOSIS — G8929 Other chronic pain: Secondary | ICD-10-CM | POA: Insufficient documentation

## 2020-12-20 DIAGNOSIS — N400 Enlarged prostate without lower urinary tract symptoms: Secondary | ICD-10-CM | POA: Insufficient documentation

## 2020-12-20 DIAGNOSIS — F341 Dysthymic disorder: Secondary | ICD-10-CM | POA: Insufficient documentation

## 2020-12-23 ENCOUNTER — Ambulatory Visit (INDEPENDENT_AMBULATORY_CARE_PROVIDER_SITE_OTHER): Payer: Medicare HMO

## 2020-12-23 DIAGNOSIS — I63412 Cerebral infarction due to embolism of left middle cerebral artery: Secondary | ICD-10-CM | POA: Diagnosis not present

## 2020-12-24 ENCOUNTER — Ambulatory Visit: Payer: Medicare HMO | Admitting: Cardiology

## 2020-12-24 LAB — CUP PACEART REMOTE DEVICE CHECK
Date Time Interrogation Session: 20220116230428
Implantable Pulse Generator Implant Date: 20200921

## 2021-01-06 NOTE — Progress Notes (Signed)
Carelink Summary Report / Loop Recorder 

## 2021-01-22 ENCOUNTER — Ambulatory Visit: Payer: Medicare HMO | Admitting: Cardiology

## 2021-01-23 LAB — CUP PACEART REMOTE DEVICE CHECK
Date Time Interrogation Session: 20220216230739
Implantable Pulse Generator Implant Date: 20200921

## 2021-01-27 ENCOUNTER — Ambulatory Visit (INDEPENDENT_AMBULATORY_CARE_PROVIDER_SITE_OTHER): Payer: Medicare HMO

## 2021-01-27 DIAGNOSIS — I639 Cerebral infarction, unspecified: Secondary | ICD-10-CM | POA: Diagnosis not present

## 2021-02-03 NOTE — Progress Notes (Signed)
Carelink Summary Report / Loop Recorder 

## 2021-03-01 LAB — CUP PACEART REMOTE DEVICE CHECK
Date Time Interrogation Session: 20220319230139
Implantable Pulse Generator Implant Date: 20200921

## 2021-03-03 ENCOUNTER — Ambulatory Visit (INDEPENDENT_AMBULATORY_CARE_PROVIDER_SITE_OTHER): Payer: Medicare HMO

## 2021-03-03 DIAGNOSIS — I63412 Cerebral infarction due to embolism of left middle cerebral artery: Secondary | ICD-10-CM | POA: Diagnosis not present

## 2021-03-13 NOTE — Progress Notes (Signed)
Carelink Summary Report / Loop Recorder 

## 2021-03-21 ENCOUNTER — Encounter: Payer: Self-pay | Admitting: Cardiology

## 2021-03-21 ENCOUNTER — Other Ambulatory Visit: Payer: Self-pay

## 2021-03-21 ENCOUNTER — Ambulatory Visit: Payer: Medicare HMO | Admitting: Cardiology

## 2021-03-21 ENCOUNTER — Telehealth: Payer: Self-pay | Admitting: Cardiology

## 2021-03-21 VITALS — BP 112/68 | HR 63 | Ht 70.0 in | Wt 210.0 lb

## 2021-03-21 DIAGNOSIS — I2510 Atherosclerotic heart disease of native coronary artery without angina pectoris: Secondary | ICD-10-CM

## 2021-03-21 DIAGNOSIS — R0602 Shortness of breath: Secondary | ICD-10-CM | POA: Diagnosis not present

## 2021-03-21 DIAGNOSIS — Z952 Presence of prosthetic heart valve: Secondary | ICD-10-CM

## 2021-03-21 DIAGNOSIS — Z8673 Personal history of transient ischemic attack (TIA), and cerebral infarction without residual deficits: Secondary | ICD-10-CM

## 2021-03-21 DIAGNOSIS — I10 Essential (primary) hypertension: Secondary | ICD-10-CM

## 2021-03-21 DIAGNOSIS — I4729 Other ventricular tachycardia: Secondary | ICD-10-CM

## 2021-03-21 DIAGNOSIS — I472 Ventricular tachycardia: Secondary | ICD-10-CM

## 2021-03-21 MED ORDER — LISINOPRIL 2.5 MG PO TABS: 2.5000 mg | ORAL_TABLET | Freq: Every day | ORAL | 1 refills | Status: DC

## 2021-03-21 NOTE — Patient Instructions (Signed)
Medication Instructions:  Your physician has recommended you make the following change in your medication:  DECREASE: Lisinopril 2.5 mg daily   STOP: Aspirin in 5 days   Continue: plavix   *If you need a refill on your cardiac medications before your next appointment, please call your pharmacy*   Lab Work: None If you have labs (blood work) drawn today and your tests are completely normal, you will receive your results only by: Marland Kitchen MyChart Message (if you have MyChart) OR . A paper copy in the mail If you have any lab test that is abnormal or we need to change your treatment, we will call you to review the results.   Testing/Procedures: None   Follow-Up: At [facility], you and your health needs are our priority.  As part of our continuing mission to provide you with exceptional heart care, we have created designated Provider Care Teams.  These Care Teams include your primary Cardiologist (physician) and Advanced Practice Providers (APPs -  Physician Assistants and Nurse Practitioners) who all work together to provide you with the care you need, when you need it.  We recommend signing up for the patient portal called "MyChart".  Sign up information is provided on this After Visit Summary.  MyChart is used to connect with patients for Virtual Visits (Telemedicine).  Patients are able to view lab/test results, encounter notes, upcoming appointments, etc.  Non-urgent messages can be sent to your provider as well.   To learn more about what you can do with MyChart, go to [email or web address].    Your next appointment:   3 month(s)  The format for your next appointment:   In Person  Provider:   Gypsy Balsam, MD   Other Instructions

## 2021-03-21 NOTE — Addendum Note (Signed)
Addended by: Hazle Quant on: 03/21/2021 04:01 PM   Modules accepted: Orders

## 2021-03-21 NOTE — Telephone Encounter (Signed)
    Pt c/o medication issue:  1. Name of Medication: ranolazine (RANEXA) 500 MG 12 hr tablet  2. How are you currently taking this medication (dosage and times per day)? Take 1 tablet (500 mg total) by mouth 2 (two) times daily.  3. Are you having a reaction (difficulty breathing--STAT)?   4. What is your medication issue? Pt's wife saw that this medications is still on pt's meds list, she is reading pt's AVS. She said pt stopped taking this medication and wanted to speak with a nurse to discuss

## 2021-03-21 NOTE — Progress Notes (Signed)
Kg   

## 2021-03-21 NOTE — Progress Notes (Signed)
Cardiology Office Note:    Date:  03/21/2021   ID:  Michael Kidd, DOB [birth year], MRN [id number]  PCP:  Barron Alvine, MD  Cardiologist:  Gypsy Balsam, MD    Referring MD: Barron Alvine, MD   Chief Complaint  Patient presents with  . Shortness of Breath  . Dizziness    History of Present Illness:    Michael Kidd is a 87 y.o. male  with complex past medical history that include coronary artery disease, remote PCI, essential hypertension, BPH, remote history of CVA, severe aortic stenosis status post TAVI done on 09/24/2020.  New left bundle branch block with first-degree AV block.  EKG today however showed normal QRS complex duration morphology. He comes today to my office for follow-up.  He complained of being weak tired exhausted.  He is joking that he does not like to get a old.  He also complained of having dizziness when he gets up he gets dizzy have to stand for a moment before start walking.  He does not passed out to simply lose his balance and he actually fell down couple weeks ago.  Denies having any chest pain tightness squeezing pressure burning chest does have fatigue tiredness as well as exertional shortness of breath.  Those are all findings.  Past Medical History:  Diagnosis Date  . Allergic rhinitis 03/29/2014  . Aortic valve stenosis 05/23/2020  . Arthralgia 07/15/2016  . Arthritis   . Benign prostatic hyperplasia 02/14/2014  . Benign prostatic hyperplasia with urinary frequency 11/02/2018  . Bilateral chronic knee pain   . BPH (benign prostatic hyperplasia)   . CAD (coronary artery disease)    s/p remote LAD PCI  . Chronic ischemic heart disease 08/13/2010  . Chronic pain of both knees 09/01/2017  . Coronary artery disease with PCI and stenting many years ago 10/19/2019  . Depression 02/14/2014   Formatting of this note might be different from the original. PHQ-9 completed 02/26/14 PHQ-2 completed 02/14/14  . Dizziness 03/12/2014  . Dupuytren's contracture 07/15/2016   . Dyspnea on exertion 08/15/2020  . Dysthymia   . Essential hypertension 03/12/2014  . Flexor tendon bowstring 01/06/2016  . Gastroesophageal reflux disease 08/18/2016  . Hepatic steatosis 03/02/2014  . History of CVA (cerebrovascular accident) 10/12/2019  . HLD (hyperlipidemia) 02/14/2014  . HTN (hypertension)   . Late effect of cerebrovascular accident (CVA) 10/19/2019  . Melanoma in situ of right upper extremity including shoulder (HCC) 08/07/2016   Formatting of this note might be different from the original. August 2017.  Dr. Chancy Milroy, [facility].  . Murmur 05/23/2020  . Myocardial infarction (HCC)   . Neuropathic pain, leg, bilateral 11/23/2017  . Nonsustained ventricular tachycardia (HCC) 11/12/2020  . Pure hypercholesterolemia 09/14/2018  . Quadriceps weakness 09/01/2017  . Renal cyst   . Risk for falls 10/01/2016  . S/P TAVR (transcatheter aortic valve replacement) 09/24/2020  . Short of breath on exertion    per patient  . Stroke [facility])    November  . Symptomatic severe aortic stenosis with normal ejection fraction 08/15/2020    Past Surgical History:  Procedure Laterality Date  . CATARACT EXTRACTION W/ INTRAOCULAR LENS  IMPLANT, BILATERAL    . cataract repair    . HERNIA REPAIR     unsure of date  . LOOP RECORDER INSERTION N/A 08/28/2019   Procedure: LOOP RECORDER INSERTION;  Surgeon: Marinus Maw, MD;  Location[facility] INVASIVE CV LAB;  Service: Cardiovascular;  Laterality: N/A;  . PERCUTANEOUS CORONARY  STENT INTERVENTION (PCI-S)    . RIGHT/LEFT HEART CATH AND CORONARY ANGIOGRAPHY N/A 08/22/2020   Procedure: RIGHT/LEFT HEART CATH AND CORONARY ANGIOGRAPHY;  Surgeon: Marykay Lex, MD;  Location: [facility] INVASIVE CV LAB;  Service: Cardiovascular;  Laterality: N/A;  . TEE WITHOUT CARDIOVERSION N/A 09/24/2020   Procedure: TRANSESOPHAGEAL ECHOCARDIOGRAM (TEE);  Surgeon: Tonny Bollman, MD;  Location[facility] OR;  Service: Open Heart Surgery;  Laterality: N/A;  . TRANSCATHETER  AORTIC VALVE REPLACEMENT, TRANSFEMORAL N/A 09/24/2020   Procedure: TRANSCATHETER AORTIC VALVE REPLACEMENT, TRANSFEMORAL - USING EDWARDS SAPIEN 3 ULTRA  VALVE SIZE ;  Surgeon: Tonny Bollman, MD;  Location: [facility] OR;  Service: Open Heart Surgery;  Laterality: N/A;    Current Medications: No outpatient medications have been marked as taking for the 03/21/21 encounter (Office Visit) with Georgeanna Lea, MD.     Allergies:   Patient has no known allergies.   Social History   Socioeconomic History  . Marital status: Married    Spouse name: Not on file  . Number of children: Not on file  . Years of education: Not on file  . Highest education level: Not on file  Occupational History  . Not on file  Tobacco Use  . Smoking status: Former Games developer  . Smokeless tobacco: Never Used  Vaping Use  . Vaping Use: Never used  Substance and Sexual Activity  . Alcohol use: Not Currently  . Drug use: Never  . Sexual activity: Not on file  Other Topics Concern  . Not on file  Social History Narrative  . Not on file   Social Determinants of Health   Financial Resource Strain: Not on file  Food Insecurity: Not on file  Transportation Needs: Not on file  Physical Activity: Not on file  Stress: Not on file  Social Connections: Not on file     Family History: The patient's family history includes Diabetes in his mother; Stroke in his father. ROS:   Please see the history of present illness.    All 14 point review of systems negative except as described per history of present illness  EKGs/Labs/Other Studies Reviewed:      Recent Labs: 09/20/2020: ALT 21; B Natriuretic Peptide 109.5 09/25/2020: Hemoglobin 12.8; Magnesium 1.8; Platelets 167 11/12/2020: NT-Pro BNP 598 11/26/2020: BUN 21; Creatinine, Ser 1.17; Potassium 4.4; Sodium 144  Recent Lipid Panel    Component Value Date/Time   CHOL 124 08/27/2019 0532   TRIG 113 08/27/2019 0532   HDL 30 (L) 08/27/2019 0532   CHOLHDL 4.1  08/27/2019 0532   VLDL 23 08/27/2019 0532   LDLCALC 71 08/27/2019 0532    Physical Exam:    VS:  BP 112/68 (BP Location: Right Arm, Patient Position: Sitting)   Pulse 63   Ht 5\' 10"  (1.778 m)   Wt 210 lb (95.3 kg)   SpO2 92%   BMI 30.13 kg/m     Wt Readings from Last 3 Encounters:  03/21/21 210 lb (95.3 kg)  11/12/20 212 lb (96.2 kg)  10/02/20 209 lb 3.2 oz (94.9 kg)     GEN:  Well nourished, well developed in no acute distress HEENT: Normal NECK: No JVD; No carotid bruits LYMPHATICS: No lymphadenopathy CARDIAC: RRR, systolic ejection murmur grade 1/6 best heard at right upper portion of the sternum, no rubs, no gallops RESPIRATORY:  Clear to auscultation without rales, wheezing or rhonchi  ABDOMEN: Soft, non-tender, non-distended MUSCULOSKELETAL:  No edema; No deformity  SKIN: Warm and dry LOWER EXTREMITIES: no swelling NEUROLOGIC:  Alert and oriented x 3 PSYCHIATRIC:  Normal affect   ASSESSMENT:    1. S/P TAVR (transcatheter aortic valve replacement)   2. Short of breath on exertion   3. History of CVA (cerebrovascular accident)   4. Nonsustained ventricular tachycardia (HCC)   5. Primary hypertension   6. Coronary artery disease involving native coronary artery of native heart without angina pectoris    PLAN:    In order of problems listed above:  1. Status post TAVI, 26 mm sapient 3 Edwards, implanted 10 September 24, 2020.  Doing well from that point review of valve sounds good.  Echocardiogram done on October 24, 2020 showing normally functioning prosthesis.  We will continue monitoring.  Will discontinue aspirin in 4 days.  That will be exactly 6 months after his TAVR implantation 2. Left bundle branch with first-degree AV block after TAVI, today's EKG showed normal QS complex duration morphology normal PR conduction. 3. Dizziness I suspect is related to low blood pressure.  I will discontinue his Lasix also will cut down his lisinopril to only 2.5 daily.  See  him back in about 3 months to see how he does. 4. Coronary disease stable from that point review denies of any chest pain tightness pressure burning chest 5. Nonsustained ventricular tachycardia.  Denies have any syncope, palpitations   Medication Adjustments/Labs and Tests Ordered: Current medicines are reviewed at length with the patient today.  Concerns regarding medicines are outlined above.  No orders of the defined types were placed in this encounter.  Medication changes: No orders of the defined types were placed in this encounter.   Signed, Georgeanna Lea, MD, [facility] 03/21/2021 3:47 PM    [facility]

## 2021-03-21 NOTE — Addendum Note (Signed)
Addended by: Heywood Bene on: 03/21/2021 04:55 PM   Modules accepted: Orders

## 2021-03-24 NOTE — Telephone Encounter (Signed)
Left message on patients voicemail to please return our call.   

## 2021-03-25 NOTE — Telephone Encounter (Signed)
Left message on patients voicemail to please return our call.   

## 2021-03-25 NOTE — Telephone Encounter (Signed)
Patient was returning phone call 

## 2021-03-25 NOTE — Telephone Encounter (Signed)
Called and spoke to patient wife per dpr. Informed her we are aware he is not taking ranexa, it is not documented as taking and we told him in January that he does not have to take.

## 2021-03-26 ENCOUNTER — Ambulatory Visit (INDEPENDENT_AMBULATORY_CARE_PROVIDER_SITE_OTHER): Payer: Medicare HMO

## 2021-03-26 DIAGNOSIS — Z8673 Personal history of transient ischemic attack (TIA), and cerebral infarction without residual deficits: Secondary | ICD-10-CM | POA: Diagnosis not present

## 2021-03-26 LAB — CUP PACEART REMOTE DEVICE CHECK
Date Time Interrogation Session: 20220419230333
Implantable Pulse Generator Implant Date: 20200921

## 2021-04-14 NOTE — Progress Notes (Signed)
Carelink Summary Report / Loop Recorder 

## 2021-04-28 ENCOUNTER — Ambulatory Visit: Payer: Medicare HMO

## 2021-04-28 DIAGNOSIS — I4729 Other ventricular tachycardia: Secondary | ICD-10-CM

## 2021-04-29 LAB — CUP PACEART REMOTE DEVICE CHECK
Date Time Interrogation Session: 20220520230418
Implantable Pulse Generator Implant Date: 20200921

## 2021-05-07 ENCOUNTER — Telehealth: Payer: Self-pay | Admitting: Cardiology

## 2021-05-07 DIAGNOSIS — R0600 Dyspnea, unspecified: Secondary | ICD-10-CM

## 2021-05-07 DIAGNOSIS — R0609 Other forms of dyspnea: Secondary | ICD-10-CM

## 2021-05-07 NOTE — Telephone Encounter (Signed)
Called and spoke to patient wife. She reports patient is feeling weak and short of breath and has felt like this since April 2022 being short of breath. And overall weak since valve replacement last year. No swelling, no weight gain, no palpitation, chest pain. She reports his medtronic machine is flashing yellow but she talked to [employer] and they report it is fine. He is to see Dr. Bing Matter in July but wants to be seen sooner. There is no available appointment until September. Will check with Dr. Bing Matter.

## 2021-05-07 NOTE — Telephone Encounter (Signed)
Pt c/o Shortness Of Breath: STAT if SOB developed within the last 24 hours or pt is noticeably SOB on the phone  1. Are you currently SOB (can you hear that pt is SOB on the phone)? Not currently  2. How long have you been experiencing SOB?  Patient's wife states the SOB has been going on for months, but she didn't give a specific time frame. She states he was SOB when he saw Dr. Bing Matter on 03/21/21.  3. Are you SOB when sitting or when up moving around?  Both   4. Are you currently experiencing any other symptoms?  Patient's wife states the patient has also been weak and "feels bad overall."

## 2021-05-08 NOTE — Telephone Encounter (Signed)
Left message for patient to return call.

## 2021-05-08 NOTE — Telephone Encounter (Signed)
Pt's wife is returning call from earlier today. Please advise

## 2021-05-08 NOTE — Telephone Encounter (Signed)
Called and spoke to patient wife per dpr. Scheduled patient appointment and discussed her having patient come in for labs before. She understood no further questions.

## 2021-05-10 LAB — BASIC METABOLIC PANEL
BUN/Creatinine Ratio: 16 (ref 10–24)
BUN: 16 mg/dL (ref 8–27)
CO2: 23 mmol/L (ref 20–29)
Calcium: 9.2 mg/dL (ref 8.6–10.2)
Chloride: 106 mmol/L (ref 96–106)
Creatinine, Ser: 1.02 mg/dL (ref 0.76–1.27)
Glucose: 109 mg/dL — ABNORMAL HIGH (ref 65–99)
Potassium: 4.2 mmol/L (ref 3.5–5.2)
Sodium: 143 mmol/L (ref 134–144)
eGFR: 71 mL/min/{1.73_m2} (ref >59)

## 2021-05-10 LAB — PRO B NATRIURETIC PEPTIDE: NT-Pro BNP: 482 pg/mL (ref 0–486)

## 2021-05-12 ENCOUNTER — Other Ambulatory Visit: Payer: Self-pay

## 2021-05-12 ENCOUNTER — Telehealth: Payer: Self-pay | Admitting: Cardiology

## 2021-05-12 NOTE — Telephone Encounter (Signed)
Patient returning call for lab results. 

## 2021-05-12 NOTE — Telephone Encounter (Signed)
Patient wife informed of results per dpr.  

## 2021-05-15 ENCOUNTER — Other Ambulatory Visit: Payer: Self-pay

## 2021-05-15 ENCOUNTER — Ambulatory Visit: Payer: Medicare HMO | Admitting: Cardiology

## 2021-05-15 VITALS — BP 120/70 | HR 53 | Ht 70.0 in | Wt 209.0 lb

## 2021-05-15 DIAGNOSIS — I10 Essential (primary) hypertension: Secondary | ICD-10-CM

## 2021-05-15 DIAGNOSIS — Z952 Presence of prosthetic heart valve: Secondary | ICD-10-CM

## 2021-05-15 DIAGNOSIS — I6930 Unspecified sequelae of cerebral infarction: Secondary | ICD-10-CM | POA: Diagnosis not present

## 2021-05-15 DIAGNOSIS — I2510 Atherosclerotic heart disease of native coronary artery without angina pectoris: Secondary | ICD-10-CM | POA: Diagnosis not present

## 2021-05-15 NOTE — Patient Instructions (Signed)
Medication Instructions:  Your physician has recommended you make the following change in your medication:    STOP: Metoprolol   *If you need a refill on your cardiac medications before your next appointment, please call your pharmacy*   Lab Work: None If you have labs (blood work) drawn today and your tests are completely normal, you will receive your results only by: MyChart Message (if you have MyChart) OR A paper copy in the mail If you have any lab test that is abnormal or we need to change your treatment, we will call you to review the results.   Testing/Procedures: Your physician has requested that you have an echocardiogram. Echocardiography is a painless test that uses sound waves to create images of your heart. It provides your doctor with information about the size and shape of your heart and how well your heart's chambers and valves are working. This procedure takes approximately one hour. There are no restrictions for this procedure.    Follow-Up: At [facility], you and your health needs are our priority.  As part of our continuing mission to provide you with exceptional heart care, we have created designated Provider Care Teams.  These Care Teams include your primary Cardiologist (physician) and Advanced Practice Providers (APPs -  Physician Assistants and Nurse Practitioners) who all work together to provide you with the care you need, when you need it.  We recommend signing up for the patient portal called "MyChart".  Sign up information is provided on this After Visit Summary.  MyChart is used to connect with patients for Virtual Visits (Telemedicine).  Patients are able to view lab/test results, encounter notes, upcoming appointments, etc.  Non-urgent messages can be sent to your provider as well.   To learn more about what you can do with MyChart, go to [email or web address].    Your next appointment:   6 month(s)  The format for your next appointment:   In  Person  Provider:   Gypsy Balsam, MD   Other Instructions

## 2021-05-15 NOTE — Progress Notes (Signed)
Cardiology Office Note:    Date:  05/15/2021   ID:  Michael Kidd, DOB [birth year], MRN [id number]  PCP:  Barron Alvine, MD  Cardiologist:  Gypsy Balsam, MD    Referring MD: Barron Alvine, MD   Chief Complaint  Patient presents with   Shortness of Breath    History of Present Illness:    Michael Kidd is a 87 y.o. male with complex past medical history.  That include severe arctic stenosis, status post TAVI done on 09/24/2020 with 26 mm SAPIEN 3 Edwards valve.  At that time he developed left bundle branch block which was new and he was implanted with implantable loop recorder by [employer].  He has had a history of remote CVA history of nonsustained ventricular tachycardia, essential hypertension, coronary artery disease but no recent issues.  He comes today to my office complaining being weak tired and exhausted.  He said he is good for nothing.  I did do proBNP which seems to be normal I did notice that he is somewhat bradycardic today.  Denies have any chest pain tightness squeezing pressure burning chest.  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 PHQ-9 completed 02/26/14 PHQ-2 completed 02/14/14   Dizziness 03/12/2014   Dupuytren's contracture 07/15/2016   Dyspnea on exertion 08/15/2020   Dysthymia    Essential hypertension 03/12/2014   Flexor tendon bowstring 01/06/2016   Gastroesophageal reflux disease 08/18/2016    Hepatic steatosis 03/02/2014   History of CVA (cerebrovascular accident) 10/12/2019   HLD (hyperlipidemia) 02/14/2014   HTN (hypertension)    Late effect of cerebrovascular accident (CVA) 10/19/2019   Melanoma in situ of right upper extremity including shoulder (HCC) 08/07/2016   Formatting of this note might be different from the original. August 2017.  Dr. Chancy Milroy, [facility].   Murmur 05/23/2020   Myocardial infarction [facility])    Neuropathic pain, leg, bilateral 11/23/2017   Nonsustained ventricular tachycardia (HCC) 11/12/2020   Pure hypercholesterolemia 09/14/2018   Quadriceps weakness 09/01/2017   Renal cyst    Risk for falls 10/01/2016   S/P TAVR (transcatheter aortic valve replacement) 09/24/2020   Short of breath on exertion    per patient   Stroke [facility])    November   Symptomatic severe aortic stenosis with normal ejection fraction 08/15/2020    Past Surgical History:  Procedure Laterality Date   CATARACT EXTRACTION W/ INTRAOCULAR LENS  IMPLANT, BILATERAL     cataract repair     HERNIA REPAIR     unsure of date   LOOP RECORDER INSERTION N/A 08/28/2019   Procedure: LOOP RECORDER INSERTION;  Surgeon: Marinus Maw, MD;  Location: [facility] INVASIVE CV LAB;  Service: Cardiovascular;  Laterality: N/A;   PERCUTANEOUS CORONARY STENT INTERVENTION (PCI-S)     RIGHT/LEFT HEART CATH AND CORONARY ANGIOGRAPHY N/A 08/22/2020   Procedure: RIGHT/LEFT HEART CATH AND CORONARY ANGIOGRAPHY;  Surgeon: Marykay Lex,  MD;  Location: [facility] INVASIVE CV LAB;  Service: Cardiovascular;  Laterality: N/A;   TEE WITHOUT CARDIOVERSION N/A 09/24/2020   Procedure: TRANSESOPHAGEAL ECHOCARDIOGRAM (TEE);  Surgeon: Tonny Bollman, MD;  Location: [facility] OR;  Service: Open Heart Surgery;  Laterality: N/A;   TRANSCATHETER AORTIC VALVE REPLACEMENT, TRANSFEMORAL N/A 09/24/2020   Procedure: TRANSCATHETER AORTIC VALVE REPLACEMENT, TRANSFEMORAL - USING EDWARDS SAPIEN 3 ULTRA  VALVE SIZE ;  Surgeon: Tonny Bollman, MD;   Location: [facility] OR;  Service: Open Heart Surgery;  Laterality: N/A;    Current Medications: Current Meds  Medication Sig   acetaminophen (TYLENOL) 650 MG CR tablet Take 325 mg by mouth as needed for pain.   atorvastatin (LIPITOR) 40 MG tablet Take 1 tablet (40 mg total) by mouth daily at 6 PM.   calcium carbonate (TUMS - DOSED IN MG ELEMENTAL CALCIUM) 500 MG chewable tablet Chew 1-2 tablets by mouth 3 (three) times daily as needed for indigestion or heartburn.   clopidogrel (PLAVIX) 75 MG tablet Take 1 tablet by mouth once daily (Patient taking differently: Take 75 mg by mouth once.)   doxazosin (CARDURA) 1 MG tablet Take 1 tablet by mouth daily after supper.   finasteride (PROSCAR) 5 MG tablet Take 5 mg by mouth every evening.    fluticasone (FLONASE) 50 MCG/ACT nasal spray Place 2 sprays into both nostrils daily as needed for allergies or rhinitis.   lisinopril (ZESTRIL) 2.5 MG tablet Take 1 tablet (2.5 mg total) by mouth daily.   Naphazoline-Glycerin (CLEAR EYES MAX REDNESS RELIEF) 0.03-0.5 % SOLN Place 1-2 drops into both eyes 3 (three) times daily as needed (redness relief).   omeprazole (PRILOSEC) 20 MG capsule Take 20 mg by mouth daily.    [DISCONTINUED] metoprolol tartrate (LOPRESSOR) 25 MG tablet Take 12.5 mg by mouth 2 (two) times daily.     Allergies:   Patient has no known allergies.   Social History   Socioeconomic History   Marital status: Married    Spouse name: Not on file   Number of children: Not on file   Years of education: Not on file   Highest education level: Not on file  Occupational History   Not on file  Tobacco Use   Smoking status: Former    Pack years: 0.00   Smokeless tobacco: Never  Vaping Use   Vaping Use: Never used  Substance and Sexual Activity   Alcohol use: Not Currently   Drug use: Never   Sexual activity: Not on file  Other Topics Concern   Not on file  Social History Narrative   Not on file   Social Determinants of Health    Financial Resource Strain: Not on file  Food Insecurity: Not on file  Transportation Needs: Not on file  Physical Activity: Not on file  Stress: Not on file  Social Connections: Not on file     Family History: The patient's family history includes Diabetes in his mother; Stroke in his father. ROS:   Please see the history of present illness.    All 14 point review of systems negative except as described per history of present illness  EKGs/Labs/Other Studies Reviewed:      Recent Labs: 09/20/2020: ALT 21; B Natriuretic Peptide 109.5 09/25/2020: Hemoglobin 12.8; Magnesium 1.8; Platelets 167 05/09/2021: BUN 16; Creatinine, Ser 1.02; NT-Pro BNP 482; Potassium 4.2; Sodium 143  Recent Lipid Panel    Component Value Date/Time   CHOL 124 08/27/2019 0532   TRIG 113 08/27/2019 0532   HDL 30 (  L) 08/27/2019 0532   CHOLHDL 4.1 08/27/2019 0532   VLDL 23 08/27/2019 0532   LDLCALC 71 08/27/2019 0532    Physical Exam:    VS:  BP 120/70 (BP Location: Left Arm, Patient Position: Sitting)   Pulse (!) 53   Ht 5\' 10"  (1.778 m)   Wt 209 lb (94.8 kg)   SpO2 92%   BMI 29.99 kg/m     Wt Readings from Last 3 Encounters:  05/15/21 209 lb (94.8 kg)  03/21/21 210 lb (95.3 kg)  11/12/20 212 lb (96.2 kg)     GEN:  Well nourished, well developed in no acute distress HEENT: Normal NECK: No JVD; No carotid bruits LYMPHATICS: No lymphadenopathy CARDIAC: RRR, systolic ejection murmur grade 1/6 to 2/6 best at right upper portion of the sternum no rubs, no gallops RESPIRATORY:  Clear to auscultation without rales, wheezing or rhonchi  ABDOMEN: Soft, non-tender, non-distended MUSCULOSKELETAL:  No edema; No deformity  SKIN: Warm and dry LOWER EXTREMITIES: no swelling NEUROLOGIC:  Alert and oriented x 3 PSYCHIATRIC:  Normal affect   ASSESSMENT:    1. Coronary artery disease involving native coronary artery of native heart without angina pectoris   2. S/P TAVR (transcatheter aortic valve  replacement)   3. Primary hypertension   4. Late effect of cerebrovascular accident (CVA)    PLAN:    In order of problems listed above:  Status post TAVI I will schedule him to have another echocardiogram to make sure structurally his heart is fine and his ejection fraction is preserved.  However I do not see any evidence of congestive heart failure physical exam, his proBNP is also normal. 2.  Profound weakness fatigue and tiredness I suspect it may be related to beta-blocker I will ask him to discontinue metoprolol. 3.  Essential hypertension his blood pressure is well controlled continue present management. 4.  Dyslipidemia I did review his K PN which show me LDL at 71 and HDL 38 is a good cholesterol profile we will continue present management.   Medication Adjustments/Labs and Tests Ordered: Current medicines are reviewed at length with the patient today.  Concerns regarding medicines are outlined above.  Orders Placed This Encounter  Procedures   EKG 12-Lead   ECHOCARDIOGRAM COMPLETE   Medication changes: No orders of the defined types were placed in this encounter.   Signed, 14/07/21, MD, [facility] 05/15/2021 10:53 AM    [facility]

## 2021-05-16 ENCOUNTER — Telehealth: Payer: Self-pay | Admitting: Cardiovascular Disease

## 2021-05-16 NOTE — Telephone Encounter (Signed)
PTS WIFE CALLED IN WANTING TO KNOW IF HE SHOULD STILL BE TAKING doxazosin doxazosin (CARDURA) 1 MG tablet, PLEASE ADVISE

## 2021-05-20 NOTE — Telephone Encounter (Signed)
Tried to call patient no answer will continue efforts.

## 2021-05-21 ENCOUNTER — Telehealth: Payer: Self-pay | Admitting: Cardiology

## 2021-05-21 NOTE — Telephone Encounter (Signed)
Left message for patient to return call.

## 2021-05-21 NOTE — Telephone Encounter (Signed)
Patient wife informed of Dr. Vanetta Shawl note.

## 2021-05-21 NOTE — Telephone Encounter (Signed)
See additional encounter

## 2021-05-21 NOTE — Telephone Encounter (Signed)
Patient's wife called to talk with Dr. Bing Matter or nurse. Please call back

## 2021-05-27 ENCOUNTER — Ambulatory Visit (INDEPENDENT_AMBULATORY_CARE_PROVIDER_SITE_OTHER): Payer: Medicare HMO

## 2021-05-27 DIAGNOSIS — Z8673 Personal history of transient ischemic attack (TIA), and cerebral infarction without residual deficits: Secondary | ICD-10-CM

## 2021-05-27 LAB — CUP PACEART REMOTE DEVICE CHECK
Date Time Interrogation Session: 20220620230645
Implantable Pulse Generator Implant Date: 20200921

## 2021-06-04 ENCOUNTER — Other Ambulatory Visit: Payer: Medicare HMO

## 2021-06-06 ENCOUNTER — Telehealth: Payer: Self-pay | Admitting: Cardiology

## 2021-06-06 NOTE — Telephone Encounter (Signed)
Spoke to patient wife per dpr. She reports patient and her had covid almost 2 weeks ago. He cancelled echo due to this and needs to be rescheduled they have now both tested negative. I sent a message to schedulers for them to call him and get him rescheduled. No further questions.

## 2021-06-06 NOTE — Telephone Encounter (Signed)
Patient's wife is requesting to speak with Dr. Vanetta Shawl nurse regarding his echo, previously scheduled for 06/04/21. I offered to reschedule for a later date, but she declined. States she would just prefer to speak with the nurse about it. Please return call when able.

## 2021-06-16 NOTE — Progress Notes (Signed)
Carelink Summary Report / Loop Recorder 

## 2021-07-02 DIAGNOSIS — Z85820 Personal history of malignant melanoma of skin: Secondary | ICD-10-CM | POA: Insufficient documentation

## 2021-07-03 ENCOUNTER — Telehealth: Payer: Self-pay

## 2021-07-03 ENCOUNTER — Ambulatory Visit (INDEPENDENT_AMBULATORY_CARE_PROVIDER_SITE_OTHER): Payer: Medicare HMO

## 2021-07-03 ENCOUNTER — Other Ambulatory Visit: Payer: Self-pay

## 2021-07-03 DIAGNOSIS — I2510 Atherosclerotic heart disease of native coronary artery without angina pectoris: Secondary | ICD-10-CM | POA: Diagnosis not present

## 2021-07-03 LAB — ECHOCARDIOGRAM COMPLETE
AR max vel: 0.8 cm2
AV Area VTI: 0.93 cm2
AV Area mean vel: 0.78 cm2
AV Mean grad: 17 mmHg
AV Peak grad: 28.9 mmHg
Ao pk vel: 2.69 m/s
Area-P 1/2: 3.12 cm2
S' Lateral: 3.6 cm

## 2021-07-03 NOTE — Telephone Encounter (Signed)
-----   Message from Georgeanna Lea, MD sent at 07/03/2021  4:27 PM EDT ----- Echo looks good, valve functioning normally

## 2021-07-03 NOTE — Telephone Encounter (Signed)
Spoke with patient regarding results and recommendation.  Patient verbalizes understanding and is agreeable to plan of care. Advised patient to call back with any issues or concerns.  

## 2021-07-03 NOTE — Progress Notes (Signed)
Complete echocardiogram performed.  Jimmy Kimmie Doren RDCS, RVT  

## 2021-07-04 ENCOUNTER — Ambulatory Visit: Payer: Medicare HMO | Admitting: Cardiology

## 2021-07-11 ENCOUNTER — Other Ambulatory Visit: Payer: Self-pay | Admitting: Physician Assistant

## 2021-07-11 DIAGNOSIS — Z952 Presence of prosthetic heart valve: Secondary | ICD-10-CM

## 2021-08-05 ENCOUNTER — Encounter: Payer: Self-pay | Admitting: Cardiovascular Disease

## 2021-08-05 ENCOUNTER — Telehealth: Payer: Self-pay | Admitting: Internal Medicine

## 2021-08-05 NOTE — Telephone Encounter (Signed)
  1. Has your device fired? No   2. Is you device beeping? No   3. Are you experiencing draining or swelling at device site? No   4. Are you calling to see if we received your device transmission? Yes, billing advised his device has not been monitored by our office since June  5. Have you passed out? No    Please route to Device Clinic Pool

## 2021-08-05 NOTE — Telephone Encounter (Signed)
I called the patient wife Michael Kidd (dpr) and she states his Relay monitor is orange and not green. I asked her to hold on so I can call tech support to get help with the monitor. We got disconnected. I called tech support and the patient wife back and did not get an answer. I filled out an online request for someone from [employer] to all the patient to help them trouble shoot their monitor.

## 2021-08-05 NOTE — Telephone Encounter (Signed)
Error

## 2021-08-12 ENCOUNTER — Telehealth: Payer: Self-pay

## 2021-08-12 NOTE — Telephone Encounter (Signed)
See 08-12-2021 phone note.

## 2021-08-12 NOTE — Telephone Encounter (Signed)
[employer] reached out to the patient and they are sending the patient a new monitor in 7-10 business days.

## 2021-08-27 LAB — CUP PACEART REMOTE DEVICE CHECK
Date Time Interrogation Session: 20220921230524
Implantable Pulse Generator Implant Date: 20200921

## 2021-08-28 ENCOUNTER — Ambulatory Visit (INDEPENDENT_AMBULATORY_CARE_PROVIDER_SITE_OTHER): Payer: Medicare HMO

## 2021-08-28 DIAGNOSIS — I63412 Cerebral infarction due to embolism of left middle cerebral artery: Secondary | ICD-10-CM

## 2021-09-04 NOTE — Progress Notes (Signed)
Carelink Summary Report / Loop Recorder 

## 2021-09-12 IMAGING — CR DG CHEST 2V
2 series · 2 of 2 positions shown · non-contrast
Comparison: 08/15/2020

CLINICAL DATA: Preop for TAVR and transesophageal echocardiogram

EXAM:
CHEST - 2 VIEW

[w chest pa]
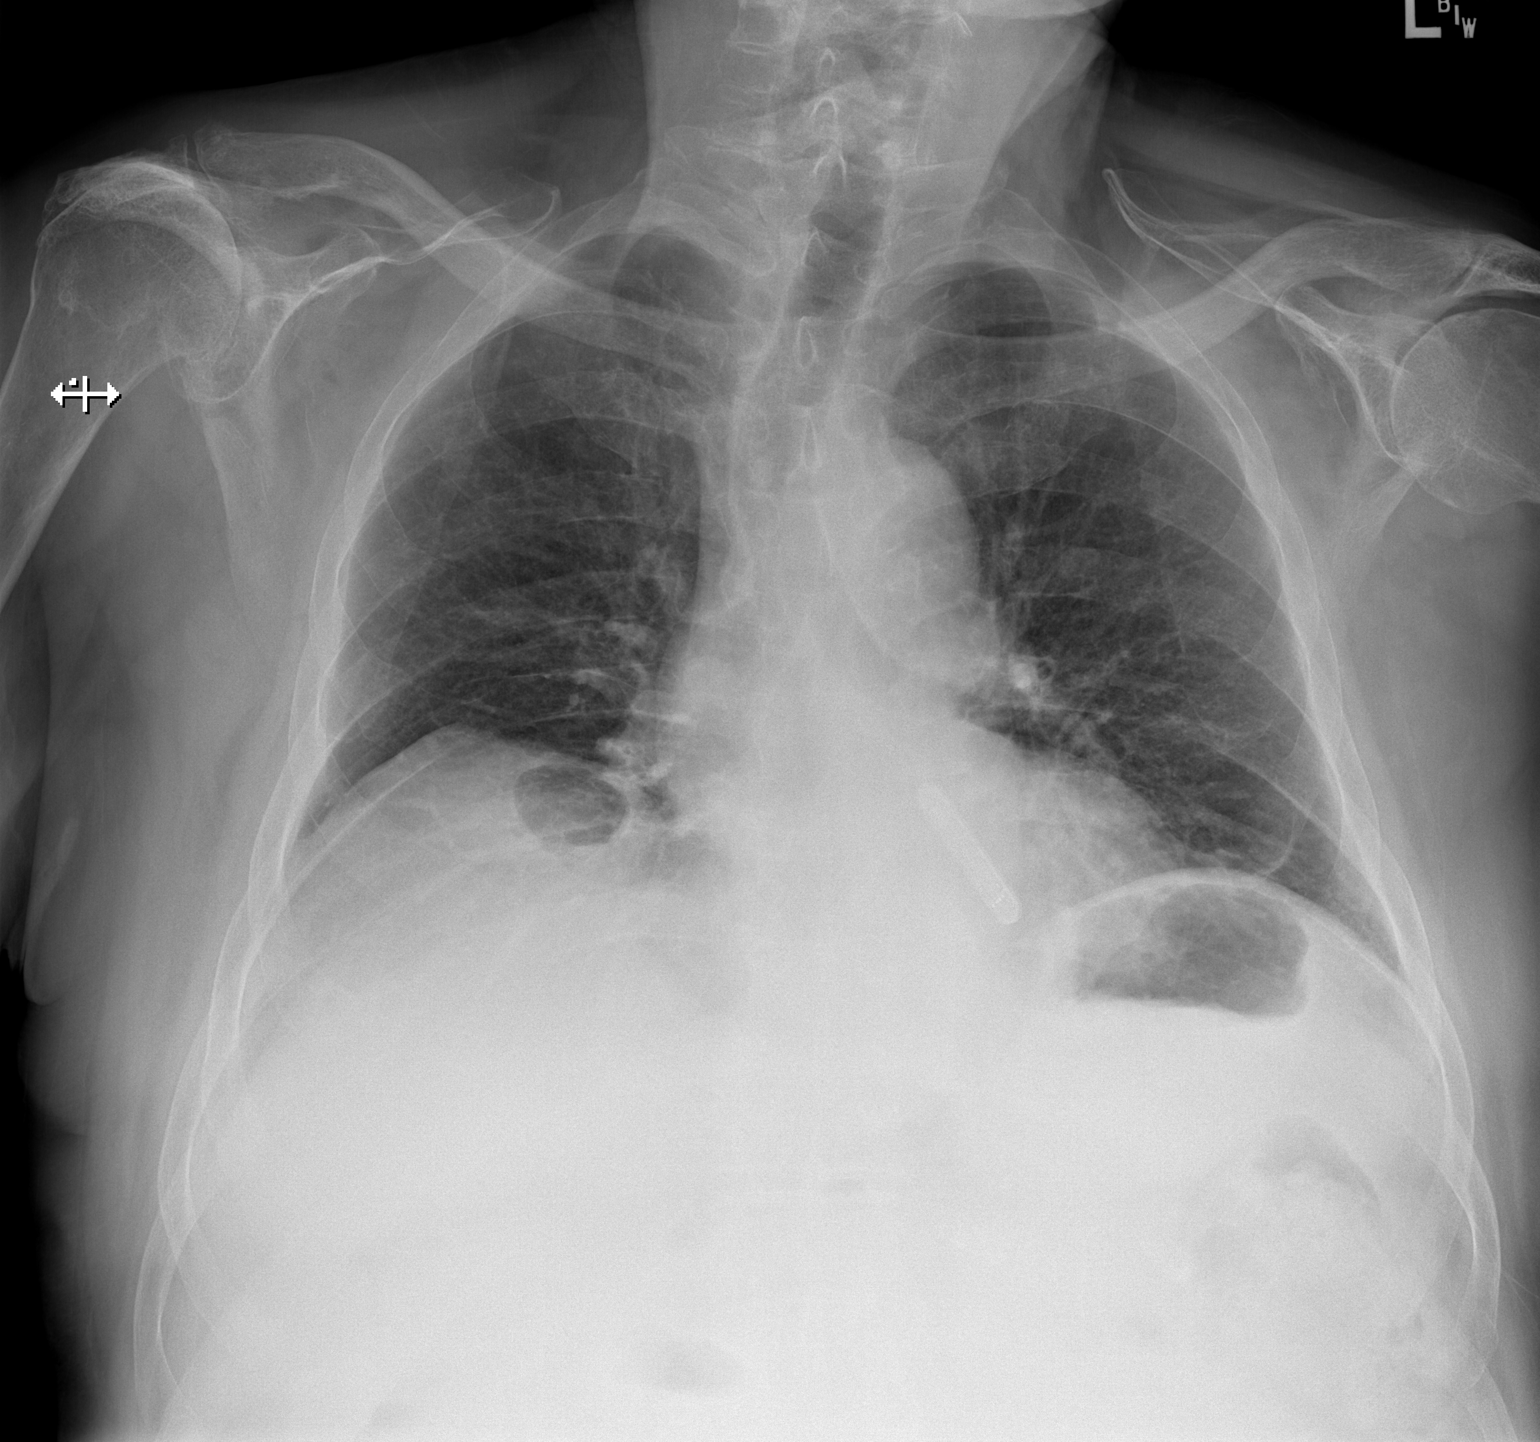

[w chest lat]
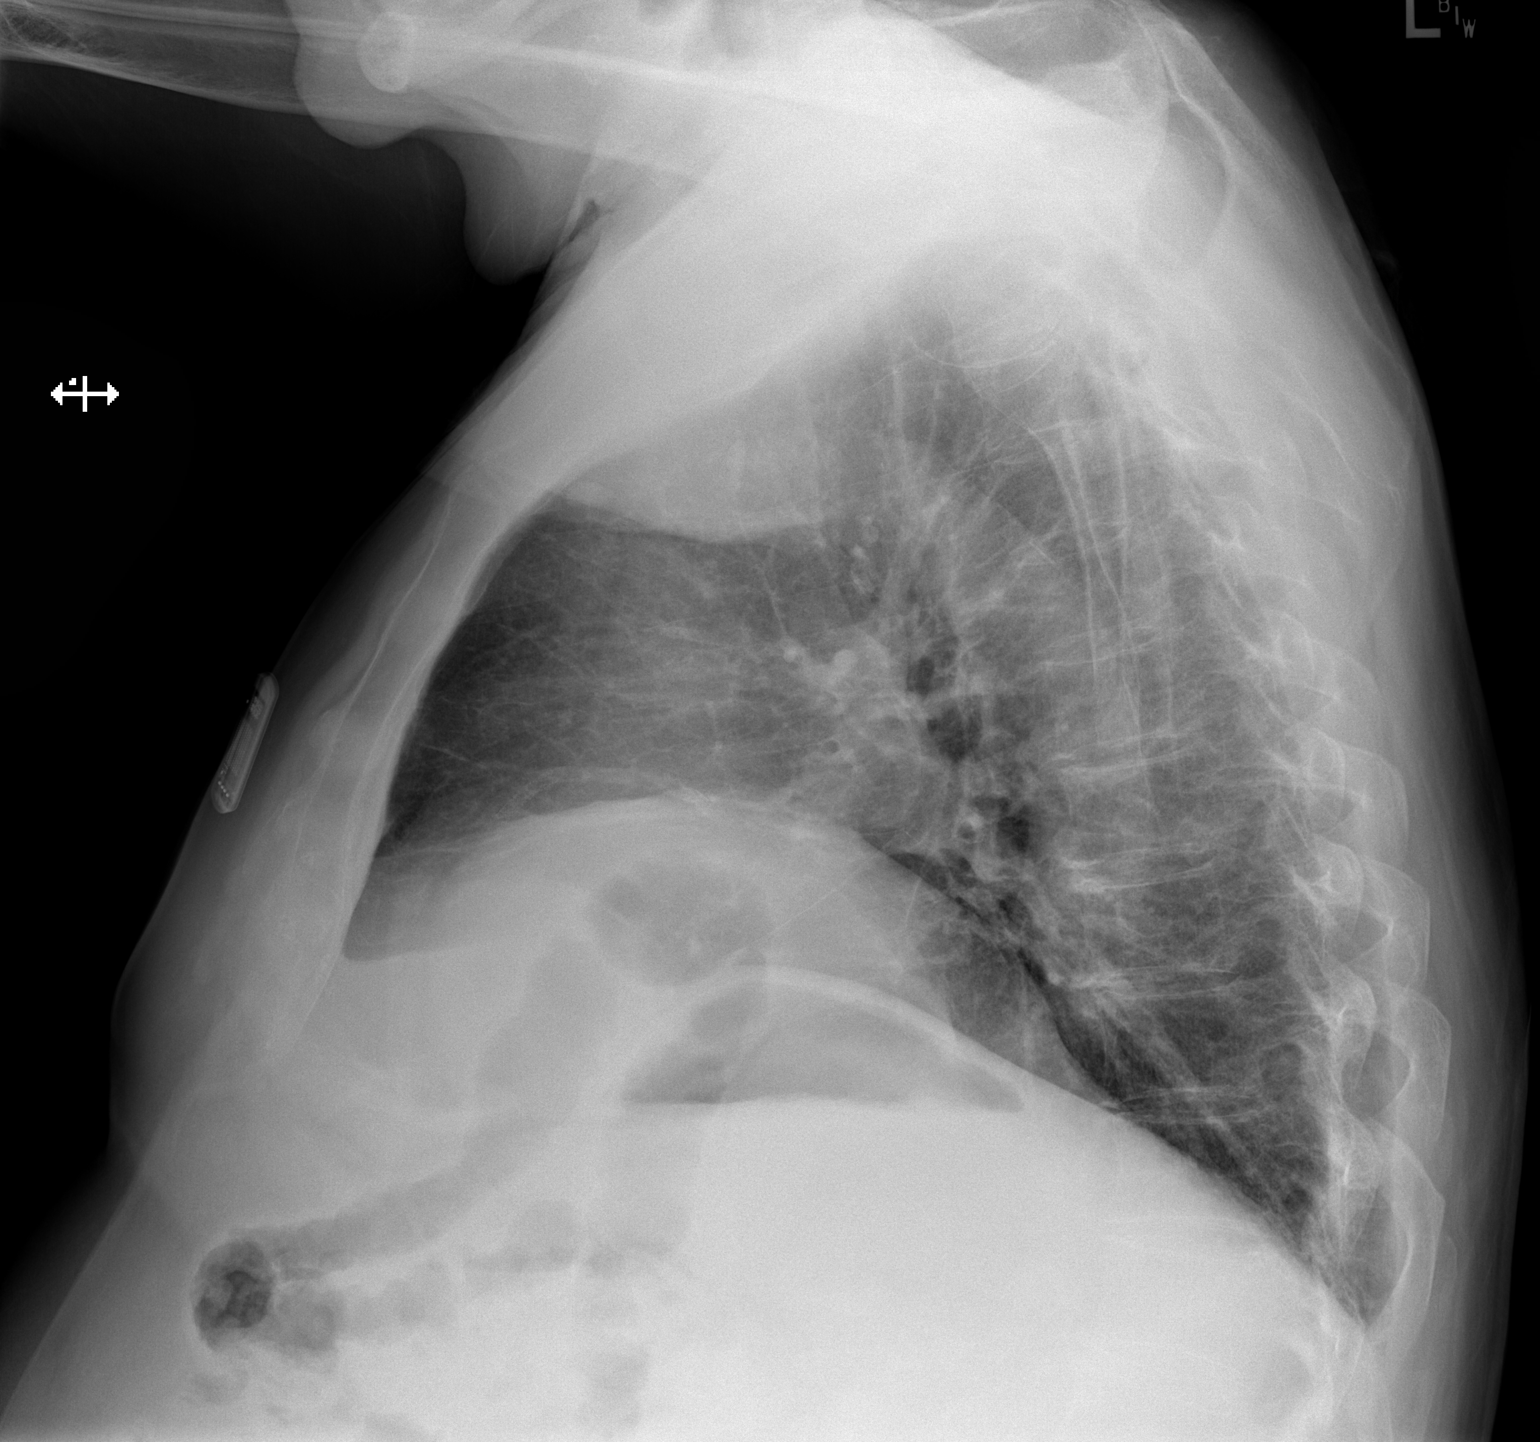

[2 of 2 positions shown; findings below may reference images not displayed]

FINDINGS: And shallow lung inflation. No focal airspace consolidation or
pulmonary edema. Normal pleural spaces.
IMPRESSION: No active cardiopulmonary disease.

## 2021-09-25 ENCOUNTER — Other Ambulatory Visit: Payer: Self-pay | Admitting: Cardiology

## 2021-09-29 LAB — CUP PACEART REMOTE DEVICE CHECK
Date Time Interrogation Session: 20221022230104
Implantable Pulse Generator Implant Date: 20200921

## 2021-09-30 ENCOUNTER — Ambulatory Visit (INDEPENDENT_AMBULATORY_CARE_PROVIDER_SITE_OTHER): Payer: Medicare HMO

## 2021-09-30 DIAGNOSIS — I63412 Cerebral infarction due to embolism of left middle cerebral artery: Secondary | ICD-10-CM | POA: Diagnosis not present

## 2021-10-01 ENCOUNTER — Other Ambulatory Visit (HOSPITAL_COMMUNITY): Payer: Medicare HMO

## 2021-10-01 ENCOUNTER — Ambulatory Visit: Payer: Medicare HMO | Admitting: Physician Assistant

## 2021-10-08 NOTE — Progress Notes (Signed)
Carelink Summary Report / Loop Recorder 

## 2021-11-02 LAB — CUP PACEART REMOTE DEVICE CHECK
Date Time Interrogation Session: 20221122230921
Implantable Pulse Generator Implant Date: 20200921

## 2021-11-03 ENCOUNTER — Ambulatory Visit (INDEPENDENT_AMBULATORY_CARE_PROVIDER_SITE_OTHER): Payer: Medicare HMO

## 2021-11-03 DIAGNOSIS — I63412 Cerebral infarction due to embolism of left middle cerebral artery: Secondary | ICD-10-CM

## 2021-11-11 NOTE — Progress Notes (Signed)
Carelink Summary Report / Loop Recorder 

## 2021-11-20 ENCOUNTER — Encounter: Payer: Self-pay | Admitting: Physician Assistant

## 2021-11-20 ENCOUNTER — Other Ambulatory Visit: Payer: Self-pay

## 2021-11-20 ENCOUNTER — Ambulatory Visit (HOSPITAL_COMMUNITY): Payer: Medicare HMO | Attending: Cardiology

## 2021-11-20 ENCOUNTER — Ambulatory Visit: Payer: Medicare HMO | Admitting: Physician Assistant

## 2021-11-20 VITALS — BP 130/68 | HR 70 | Ht 71.0 in | Wt 211.0 lb

## 2021-11-20 DIAGNOSIS — Z952 Presence of prosthetic heart valve: Secondary | ICD-10-CM

## 2021-11-20 DIAGNOSIS — Z8673 Personal history of transient ischemic attack (TIA), and cerebral infarction without residual deficits: Secondary | ICD-10-CM | POA: Diagnosis not present

## 2021-11-20 DIAGNOSIS — I10 Essential (primary) hypertension: Secondary | ICD-10-CM

## 2021-11-20 DIAGNOSIS — I2510 Atherosclerotic heart disease of native coronary artery without angina pectoris: Secondary | ICD-10-CM

## 2021-11-20 LAB — ECHOCARDIOGRAM COMPLETE
AR max vel: 1.19 cm2
AV Area VTI: 1.41 cm2
AV Area mean vel: 1.21 cm2
AV Mean grad: 12 mmHg
AV Peak grad: 21.7 mmHg
Ao pk vel: 2.33 m/s
Area-P 1/2: 2.04 cm2
P 1/2 time: 668 msec
S' Lateral: 2.7 cm

## 2021-11-20 NOTE — Progress Notes (Signed)
[facility]   MULTIDISCIPLINARY HEART VALVE CLINIC                                       Cardiology Office Note    Date:  11/20/2021   ID:  Michael Kidd, DOB May 05, 1930, MRN [id number]  PCP:  Barron Alvine, MD  Cardiologist: Dr. Bing Matter / Dr. Excell Seltzer & Dr. Laneta Simmers (TAVR)  CC: 1 year s/p TAVR   History of Present Illness:  Michael Kidd is a 87 y.o. male with a history of HTN, CAD s/p remote PCI, HTN, renal cyst, BPH, bilateral knee pain, CVA in 2020 s/p loop recorder with no afib, and severe AS s/p TAVR (09/24/20) who presents to clinic for follow up.   He had an echocardiogram 07/16/2019 at the time of his stroke which showed a trileaflet aortic valve with moderate thickening and calcification. The mean gradient was 24 mmHg with peak gradient of 35 mmHg. Aortic valve area was 0.79 cm. Left ventricular ejection fraction was 55 to 60%. He was recently seen back by Dr. Bing Matter and in August 2021 reporting worsening exertional dyspnea and fatigue. Repeat echocardiogram on 08/05/2020 showed an increase in the mean aortic valve gradient to 37 mmHg with a peak gradient of 56 mmHg. Aortic valve area was 0.67 cm. Left ventricular ejection fraction was 50 to 55%. It was felt patient may benefit from TAVR  He was evaluated by Dr. Laneta Simmers at which time he reported shortness of breath and fatigue with any walking or doing yard work. Pre TAVR work up with L/RHC revealed patency of the right coronary artery, patency of the left circumflex which is a large vessel, and moderately severe diffuse in-stent restenosis throughout the mid LAD estimated at 75% as well as calcific proximal LAD stenosis estimated at 50 to 60%. The patient's LAD stenosis is hemodynamically significant. Given lack of chest pain, the team favored medical therapy for CAD.  He was evaluated by the multidisciplinary valve team and underwent successful TAVR with a 26 mm Edwards Sapien 3 THV via the TF approach on 09/24/20. Post  operative echo showed EF 60%, normally functioning TAVR with a mean gradient of 9 mmHg and no PVL. He was discharged on his chronic plavix with the addition of aspirin.   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 He also complained of persistent weakness (preceded TAVR). A zio patch was ordered and did not show HAVB but did show NSVT. 1 month echo showed EF 55%, normally functioning TAVR with a mean gradient of 16 mmHg and no PVL.  Today he presents to clinic for follow up. Here with his son. Doing well. Does get out of breath when walking to the mailbox. Gets frustrated since he can't do all that he used to. No LE edema, orthopnea or PND. No dizziness or syncope. No blood in stool or urine. Has been under a lot of stress recently bc his wife fell and broke her hip and some issues at his house.    Past Medical History:  Diagnosis Date   Allergic rhinitis 03/29/2014   Aortic valve stenosis 05/23/2020   Arthralgia 07/15/2016   Arthritis    Benign prostatic hyperplasia 02/14/2014   Benign prostatic hyperplasia with urinary frequency 11/02/2018   Bilateral chronic knee pain    BPH (benign prostatic  hyperplasia)    CAD (coronary artery disease)    s/p remote LAD PCI   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 PHQ-9 completed 02/26/14 PHQ-2 completed 02/14/14   Dizziness 03/12/2014   Dupuytren's contracture 07/15/2016   Dyspnea on exertion 08/15/2020   Dysthymia    Essential hypertension 03/12/2014   Flexor tendon bowstring 01/06/2016   Gastroesophageal reflux disease 08/18/2016   Hepatic steatosis 03/02/2014   History of CVA (cerebrovascular accident) 10/12/2019   HLD (hyperlipidemia) 02/14/2014   HTN (hypertension)    Late effect of cerebrovascular accident (CVA)  10/19/2019   Melanoma in situ of right upper extremity including shoulder (HCC) 08/07/2016   Formatting of this note might be different from the original. August 2017.  Dr. Chancy Milroy, [facility].   Murmur 05/23/2020   Myocardial infarction [facility])    Neuropathic pain, leg, bilateral 11/23/2017   Nonsustained ventricular tachycardia 11/12/2020   Pure hypercholesterolemia 09/14/2018   Quadriceps weakness 09/01/2017   Renal cyst    Risk for falls 10/01/2016   S/P TAVR (transcatheter aortic valve replacement) 09/24/2020   Short of breath on exertion    per patient   Stroke [facility])    November   Symptomatic severe aortic stenosis with normal ejection fraction 08/15/2020    Past Surgical History:  Procedure Laterality Date   CATARACT EXTRACTION W/ INTRAOCULAR LENS  IMPLANT, BILATERAL     cataract repair     HERNIA REPAIR     unsure of date   LOOP RECORDER INSERTION N/A 08/28/2019   Procedure: LOOP RECORDER INSERTION;  Surgeon: Marinus Maw, MD;  Location: [facility] INVASIVE CV LAB;  Service: Cardiovascular;  Laterality: N/A;   PERCUTANEOUS CORONARY STENT INTERVENTION (PCI-S)     RIGHT/LEFT HEART CATH AND CORONARY ANGIOGRAPHY N/A 08/22/2020   Procedure: RIGHT/LEFT HEART CATH AND CORONARY ANGIOGRAPHY;  Surgeon: Marykay Lex, MD;  Location: [facility] INVASIVE CV LAB;  Service: Cardiovascular;  Laterality: N/A;   TEE WITHOUT CARDIOVERSION N/A 09/24/2020   Procedure: TRANSESOPHAGEAL ECHOCARDIOGRAM (TEE);  Surgeon: Tonny Bollman, MD;  Location: [facility] OR;  Service: Open Heart Surgery;  Laterality: N/A;   TRANSCATHETER AORTIC VALVE REPLACEMENT, TRANSFEMORAL N/A 09/24/2020   Procedure: TRANSCATHETER AORTIC VALVE REPLACEMENT, TRANSFEMORAL - USING EDWARDS SAPIEN 3 ULTRA  VALVE SIZE ;  Surgeon: Tonny Bollman, MD;  Location: [facility] OR;  Service: Open Heart Surgery;  Laterality: N/A;    Current Medications: Outpatient Medications Prior to Visit  Medication Sig Dispense Refill   acetaminophen (TYLENOL)  650 MG CR tablet Take 325 mg by mouth as needed for pain.     atorvastatin (LIPITOR) 40 MG tablet Take 1 tablet (40 mg total) by mouth daily at 6 PM. 30 tablet 11   calcium carbonate (TUMS - DOSED IN MG ELEMENTAL CALCIUM) 500 MG chewable tablet Chew 1-2 tablets by mouth 3 (three) times daily as needed for indigestion or heartburn.     clopidogrel (PLAVIX) 75 MG tablet Take 1 tablet by mouth once daily 90 tablet 0   doxazosin (CARDURA) 1 MG tablet Take 1 tablet by mouth daily after supper.     finasteride (PROSCAR) 5 MG tablet Take 5 mg by mouth every evening.      fluticasone (FLONASE) 50 MCG/ACT nasal spray Place 2 sprays into both nostrils daily as needed for allergies  or rhinitis.     lisinopril (ZESTRIL) 2.5 MG tablet Take 1 tablet by mouth once daily 90 tablet 1   Naphazoline-Glycerin (CLEAR EYES MAX REDNESS RELIEF) 0.03-0.5 % SOLN Place 1-2 drops into both eyes 3 (three) times daily as needed (redness relief).     omeprazole (PRILOSEC) 20 MG capsule Take 20 mg by mouth daily.      No facility-administered medications prior to visit.     Allergies:   Patient has no known allergies.   Social History   Socioeconomic History   Marital status: Married    Spouse name: Not on file   Number of children: Not on file   Years of education: Not on file   Highest education level: Not on file  Occupational History   Not on file  Tobacco Use   Smoking status: Former   Smokeless tobacco: Never  Vaping Use   Vaping Use: Never used  Substance and Sexual Activity   Alcohol use: Not Currently   Drug use: Never   Sexual activity: Not on file  Other Topics Concern   Not on file  Social History Narrative   Not on file   Social Determinants of Health   Financial Resource Strain: Not on file  Food Insecurity: Not on file  Transportation Needs: Not on file  Physical Activity: Not on file  Stress: Not on file  Social Connections: Not on file     Family History:  The patient's family  history includes Diabetes in his mother; Stroke in his father.     ROS:   Please see the history of present illness.    ROS All other systems reviewed and are negative.   PHYSICAL EXAM:   VS:  BP 130/68    Pulse 70    Ht 5\' 11"  (1.803 m)    Wt 211 lb (95.7 kg)    SpO2 96%    BMI 29.43 kg/m    GEN: Well nourished, well developed, in no acute distress HEENT: normal Neck: no JVD or masses Cardiac: RRR; no murmurs, rubs, or gallops,no edema  Respiratory:  clear to auscultation bilaterally, normal work of breathing GI: soft, nontender, nondistended, + BS MS: no deformity or atrophy Skin: warm and dry, no rash. Neuro:  Alert and Oriented x 3, Strength and sensation are intact Psych: euthymic mood, full affect   Wt Readings from Last 3 Encounters:  11/20/21 211 lb (95.7 kg)  05/15/21 209 lb (94.8 kg)  03/21/21 210 lb (95.3 kg)      Studies/Labs Reviewed:   EKG:  EKG is NOT ordered today.    Recent Labs: 05/09/2021: BUN 16; Creatinine, Ser 1.02; NT-Pro BNP 482; Potassium 4.2; Sodium 143   Lipid Panel    Component Value Date/Time   CHOL 124 08/27/2019 0532   TRIG 113 08/27/2019 0532   HDL 30 (L) 08/27/2019 0532   CHOLHDL 4.1 08/27/2019 0532   VLDL 23 08/27/2019 0532   LDLCALC 71 08/27/2019 0532    Additional studies/ records that were reviewed today include:   TAVR OPERATIVE NOTE     Date of Procedure:                09/24/2020   Preoperative Diagnosis:      Severe Aortic Stenosis    Postoperative Diagnosis:    Same    Procedure:        Transcatheter Aortic Valve Replacement - Percutaneous Transfemoral Approach  Edwards Sapien 3 Ultra THV (size 26 mm, model # I1735201, serial # [id number])              Co-Surgeons:                        Alleen Borne, MD and Tonny Bollman, MD   Anesthesiologist:                  Arta Bruce, MD   Echocardiographer:              Tobias Alexander, MD   Pre-operative Echo Findings: Severe aortic stenosis Normal left  ventricular systolic function   Post-operative Echo Findings: No paravalvular leak Normal/unchanged left ventricular systolic function    ___________________   Echo 09/26/20 IMPRESSIONS   1. Day 1 post TAVR, normal transaortic peak/mean gradients 16/9 mmHg. No  paravalvular leak.   2. Left ventricular ejection fraction, by estimation, is 60 to 65%. The  left ventricle has normal function. The left ventricle has no regional  wall motion abnormalities. There is mild concentric left ventricular  hypertrophy. Left ventricular diastolic  parameters are consistent with Grade I diastolic dysfunction (impaired  relaxation).   3. Right ventricular systolic function is normal. The right ventricular  size is normal. There is normal pulmonary artery systolic pressure. The  estimated right ventricular systolic pressure is 27.2 mmHg.   4. Left atrial size was mildly dilated.   5. The mitral valve is normal in structure. Mild mitral valve  regurgitation. No evidence of mitral stenosis.   6. The aortic valve has been repaired/replaced. Aortic valve  regurgitation is not visualized. No aortic stenosis is present. There is a  26 mm Sapien prosthetic (TAVR) valve present in the aortic position.  Procedure Date: 09/24/2020. Echo findings are  consistent with normal structure and function of the aortic valve  prosthesis. Aortic valve mean gradient measures 9.0 mmHg.   7. The inferior vena cava is normal in size with greater than 50%  respiratory variability, suggesting right atrial pressure of 3 mmHg  ___________________  Echo 10/24/20 IMPRESSIONS  1. Left ventricular ejection fraction, by estimation, is 55 to 60%. The left ventricle has normal function. The left ventricle has no regional wall motion abnormalities. Left ventricular diastolic parameters are consistent with Grade I diastolic  dysfunction (impaired relaxation).  2. Right ventricular systolic function is normal. The right ventricular  size is normal. There is normal pulmonary artery systolic pressure.  3. Left atrial size was mildly dilated.  4. Right atrial size was mildly dilated.  5. The mitral valve is normal in structure. Mild mitral valve regurgitation.  6. Normally functioning TAVR . The aortic valve has been repaired/replaced. Aortic valve regurgitation is not visualized. Procedure Date: 09/24/2020.  _______________________  Luci Bank AT 10/02/20-10/24/20 Conclusion:  Short runs of nonsustained ventricular tachycardia. Occasional supraventricular ectopy with 14 runs of supraventricular tachycardia, asymptomatic  ________________________  Echo 12/152/22 IMPRESSIONS     1. S/P TAVR with mean gradient 12 mmHg and DI 0.50; mild AI (slightly  worse compared to previous study 07/03/21; trivial at that time).   2. Left ventricular ejection fraction, by estimation, is 60 to 65%. The  left ventricle has normal function. The left ventricle has no regional  wall motion abnormalities. There is mild left ventricular hypertrophy.  Left ventricular diastolic parameters  are consistent with Grade I diastolic dysfunction (impaired relaxation).   3. Right ventricular systolic function is normal. The right ventricular  size  is normal. Tricuspid regurgitation signal is inadequate for assessing  PA pressure.   4. The mitral valve is normal in structure. Mild mitral valve  regurgitation. No evidence of mitral stenosis.   5. The aortic valve has been repaired/replaced. Aortic valve  regurgitation is mild. No aortic stenosis is present. There is a 26 mm  Sapien prosthetic (TAVR) valve present in the aortic position. Procedure  Date: 09/24/2020.   Comparison(s): Prior images reviewed side by side.    ASSESSMENT & PLAN:   Severe AS s/p TAVR: echo today shows EF 65%, normally functioning TAVR with a mean gradient of 12 mm hg and mild PVL (slightly worse than previous). Mild MR. He has NYHA class II symptoms, mostly limited by  arthritis. SBE prophylaxis discussed; the patient is edentulous and does not go to the dentist. Continue chronic monotherapy with Plavix. Continrue regular follow up with Dr. Bing Matter  HTN: BP well controlled today. No changes made  CAD: pre TAVR L/RHC revealed patency of the right coronary artery, patency of the left circumflex which is a large vessel, and moderately severe diffuse in-stent restenosis throughout the mid LAD estimated at 75% as well as calcific proximal LAD stenosis estimated at 50 to 60%. The patient's LAD stenosis is hemodynamically significant. Given lack of chest pain, the multidisciplinary valve team favored medical therapy for CAD.  Hx of CVA: has a loop recorder with no afib. Continue plavix and statin  Medication Adjustments/Labs and Tests Ordered: Current medicines are reviewed at length with the patient today.  Concerns regarding medicines are outlined above.  Medication changes, Labs and Tests ordered today are listed in the Patient Instructions below. Patient Instructions  Medication Instructions:  Your physician recommends that you continue on your current medications as directed. Please refer to the Current Medication list given to you today.  *If you need a refill on your cardiac medications before your next appointment, please call your pharmacy*   Lab Work: None   If you have labs (blood work) drawn today and your tests are completely normal, you will receive your results only by: MyChart Message (if you have MyChart) OR A paper copy in the mail If you have any lab test that is abnormal or we need to change your treatment, we will call you to review the results.   Testing/Procedures: None ordered    Follow-Up: Follow up as scheduled    Other Instructions None      Signed, Cline Crock, PA-C  11/20/2021 3:38 PM    [facility] [address] [place], [place], [place]  [address] Phone: [phone]; Fax: [phone]

## 2021-11-20 NOTE — Patient Instructions (Signed)
Medication Instructions:  Your physician recommends that you continue on your current medications as directed. Please refer to the Current Medication list given to you today.  *If you need a refill on your cardiac medications before your next appointment, please call your pharmacy*   Lab Work: None   If you have labs (blood work) drawn today and your tests are completely normal, you will receive your results only by: MyChart Message (if you have MyChart) OR A paper copy in the mail If you have any lab test that is abnormal or we need to change your treatment, we will call you to review the results.   Testing/Procedures: None ordered    Follow-Up: Follow up as scheduled    Other Instructions None

## 2021-12-01 LAB — CUP PACEART REMOTE DEVICE CHECK
Date Time Interrogation Session: 20221223230515
Implantable Pulse Generator Implant Date: 20200921

## 2021-12-10 ENCOUNTER — Ambulatory Visit (INDEPENDENT_AMBULATORY_CARE_PROVIDER_SITE_OTHER): Payer: Medicare HMO

## 2021-12-10 DIAGNOSIS — I63412 Cerebral infarction due to embolism of left middle cerebral artery: Secondary | ICD-10-CM | POA: Diagnosis not present

## 2021-12-10 LAB — CUP PACEART REMOTE DEVICE CHECK
Date Time Interrogation Session: 20230102230753
Implantable Pulse Generator Implant Date: 20200921

## 2021-12-22 NOTE — Progress Notes (Signed)
Carelink Summary Report / Loop Recorder 

## 2022-01-06 ENCOUNTER — Telehealth: Payer: Self-pay | Admitting: Cardiology

## 2022-01-06 NOTE — Telephone Encounter (Signed)
New Message:      Patient's wife wants to know if patient is going to keep the device that he have, or will it be removed?

## 2022-01-07 NOTE — Telephone Encounter (Signed)
Patient's wife is returning call. 

## 2022-01-07 NOTE — Telephone Encounter (Signed)
Left message for patient to call back  

## 2022-01-12 ENCOUNTER — Ambulatory Visit (INDEPENDENT_AMBULATORY_CARE_PROVIDER_SITE_OTHER): Payer: Medicare HMO

## 2022-01-12 DIAGNOSIS — I63412 Cerebral infarction due to embolism of left middle cerebral artery: Secondary | ICD-10-CM | POA: Diagnosis not present

## 2022-01-12 LAB — CUP PACEART REMOTE DEVICE CHECK
Date Time Interrogation Session: 20230205231028
Implantable Pulse Generator Implant Date: 20200921

## 2022-01-12 NOTE — Telephone Encounter (Signed)
Attempted to contact patient in regards to question. No answer, LMTCB.

## 2022-01-14 NOTE — Telephone Encounter (Signed)
I spoke with the patient wife Enid Derry and answered all her questions pertaining to his monitor.

## 2022-01-14 NOTE — Progress Notes (Signed)
Carelink Summary Report / Loop Recorder 

## 2022-02-16 ENCOUNTER — Ambulatory Visit (INDEPENDENT_AMBULATORY_CARE_PROVIDER_SITE_OTHER): Payer: Medicare HMO

## 2022-02-16 DIAGNOSIS — I63412 Cerebral infarction due to embolism of left middle cerebral artery: Secondary | ICD-10-CM

## 2022-02-16 LAB — CUP PACEART REMOTE DEVICE CHECK
Date Time Interrogation Session: 20230312230721
Implantable Pulse Generator Implant Date: 20200921

## 2022-02-27 NOTE — Progress Notes (Signed)
Carelink Summary Report / Loop Recorder 

## 2022-03-19 ENCOUNTER — Other Ambulatory Visit: Payer: Self-pay | Admitting: Cardiology

## 2022-03-23 ENCOUNTER — Ambulatory Visit (INDEPENDENT_AMBULATORY_CARE_PROVIDER_SITE_OTHER): Payer: Medicare HMO

## 2022-03-23 DIAGNOSIS — I63412 Cerebral infarction due to embolism of left middle cerebral artery: Secondary | ICD-10-CM

## 2022-03-23 LAB — CUP PACEART REMOTE DEVICE CHECK
Date Time Interrogation Session: 20230414230926
Implantable Pulse Generator Implant Date: 20200921

## 2022-04-08 NOTE — Progress Notes (Signed)
Carelink Summary Report / [email or web address] 

## 2022-04-10 ENCOUNTER — Telehealth: Payer: Self-pay

## 2022-04-10 NOTE — Telephone Encounter (Signed)
Wife calling to discuss "orange light" on loop recorder remote monitor. Reviewed transmissions in carelink and noted last transmission was 04/09/22. Patient is provided [employer] tech support number [phone] and encouraged to call. Patient is appreciative of information and verbalized understanding.  ?

## 2022-04-27 ENCOUNTER — Ambulatory Visit (INDEPENDENT_AMBULATORY_CARE_PROVIDER_SITE_OTHER): Payer: Medicare HMO

## 2022-04-27 DIAGNOSIS — I63412 Cerebral infarction due to embolism of left middle cerebral artery: Secondary | ICD-10-CM | POA: Diagnosis not present

## 2022-04-29 LAB — CUP PACEART REMOTE DEVICE CHECK
Date Time Interrogation Session: 20230524091211
Implantable Pulse Generator Implant Date: 20200921

## 2022-05-14 NOTE — Progress Notes (Signed)
Carelink Summary Report / Loop Recorder 

## 2022-05-21 ENCOUNTER — Ambulatory Visit: Payer: Medicare HMO | Admitting: Cardiology

## 2022-06-01 ENCOUNTER — Ambulatory Visit (INDEPENDENT_AMBULATORY_CARE_PROVIDER_SITE_OTHER): Payer: Medicare HMO

## 2022-06-01 DIAGNOSIS — I63412 Cerebral infarction due to embolism of left middle cerebral artery: Secondary | ICD-10-CM | POA: Diagnosis not present

## 2022-06-02 LAB — CUP PACEART REMOTE DEVICE CHECK
Date Time Interrogation Session: 20230619230447
Implantable Pulse Generator Implant Date: 20200921

## 2022-06-25 NOTE — Progress Notes (Signed)
Carelink Summary Report / Loop Recorder 

## 2022-06-27 ENCOUNTER — Other Ambulatory Visit: Payer: Self-pay | Admitting: Cardiology

## 2022-07-02 LAB — CUP PACEART REMOTE DEVICE CHECK
Date Time Interrogation Session: 20230722230410
Implantable Pulse Generator Implant Date: 20200921

## 2022-07-06 ENCOUNTER — Ambulatory Visit (INDEPENDENT_AMBULATORY_CARE_PROVIDER_SITE_OTHER): Payer: Medicare HMO

## 2022-07-06 DIAGNOSIS — I63412 Cerebral infarction due to embolism of left middle cerebral artery: Secondary | ICD-10-CM

## 2022-08-08 NOTE — Progress Notes (Signed)
Carelink Summary Report / Loop Recorder 

## 2022-08-11 ENCOUNTER — Ambulatory Visit (INDEPENDENT_AMBULATORY_CARE_PROVIDER_SITE_OTHER): Payer: Medicare HMO

## 2022-08-11 DIAGNOSIS — I63412 Cerebral infarction due to embolism of left middle cerebral artery: Secondary | ICD-10-CM

## 2022-08-11 LAB — CUP PACEART REMOTE DEVICE CHECK
Date Time Interrogation Session: 20230905115415
Implantable Pulse Generator Implant Date: 20200921

## 2022-08-25 ENCOUNTER — Ambulatory Visit: Payer: Medicare HMO | Attending: Cardiology | Admitting: Cardiology

## 2022-08-25 ENCOUNTER — Encounter: Payer: Self-pay | Admitting: Cardiology

## 2022-08-25 VITALS — BP 130/88 | HR 76 | Ht 70.0 in | Wt 208.6 lb

## 2022-08-25 DIAGNOSIS — I2510 Atherosclerotic heart disease of native coronary artery without angina pectoris: Secondary | ICD-10-CM

## 2022-08-25 DIAGNOSIS — Z952 Presence of prosthetic heart valve: Secondary | ICD-10-CM

## 2022-08-25 DIAGNOSIS — I259 Chronic ischemic heart disease, unspecified: Secondary | ICD-10-CM | POA: Diagnosis not present

## 2022-08-25 DIAGNOSIS — I6930 Unspecified sequelae of cerebral infarction: Secondary | ICD-10-CM

## 2022-08-25 DIAGNOSIS — R0609 Other forms of dyspnea: Secondary | ICD-10-CM

## 2022-08-25 DIAGNOSIS — I10 Essential (primary) hypertension: Secondary | ICD-10-CM | POA: Diagnosis not present

## 2022-08-25 NOTE — Progress Notes (Signed)
Cardiology Office Note:    Date:  08/25/2022   ID:  Michael Kidd, DOB Dec 11, 1929, MRN [id number]  PCP:  Jene Every, MD  Cardiologist:  Jenne Campus, MD    Referring MD: Jene Every, MD   Chief Complaint  Patient presents with   Follow-up    History of Present Illness:    Michael Kidd is a 88 y.o. male   with complex past medical history.  That include severe arctic stenosis, status post TAVI done on 09/24/2020 with 26 mm SAPIEN 3 Edwards valve.  At that time he developed left bundle branch block which was new and he was implanted with implantable loop recorder by [employer].  He has had a history of remote CVA history of nonsustained ventricular tachycardia, essential hypertension, coronary artery disease but no recent issues.  He comes today 2 months for follow-up.  Overall seems to be doing fair.  He complained of having multiple joint aches.  Being tired and exhausted he is jokingly saying that he is getting old.  Also complained of having some pain in his legs.  Denies having a palpitations no shortness of breath no chest pain.  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 PHQ-9 completed 02/26/14 PHQ-2 completed 02/14/14   Dizziness 03/12/2014   Dupuytren's contracture 07/15/2016   Dyspnea on exertion 08/15/2020   Dysthymia    Essential hypertension 03/12/2014   Flexor tendon bowstring 01/06/2016   Gastroesophageal  reflux disease 08/18/2016   Hepatic steatosis 03/02/2014   History of CVA (cerebrovascular accident) 10/12/2019   HLD (hyperlipidemia) 02/14/2014   HTN (hypertension)    Late effect of cerebrovascular accident (CVA) 10/19/2019   Melanoma in situ of right upper extremity including shoulder ([place]) 08/07/2016   Formatting of this note might be different from the original. August 2017.  Dr. Mare Ferrari, [facility].   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   Short of breath on exertion    per patient   Stroke [facility])    November   Symptomatic severe aortic stenosis with normal ejection fraction 08/15/2020    Past Surgical History:  Procedure Laterality Date   CATARACT EXTRACTION W/ INTRAOCULAR LENS  IMPLANT, BILATERAL     cataract repair     HERNIA REPAIR     unsure of date   LOOP RECORDER INSERTION N/A 08/28/2019   Procedure: LOOP RECORDER INSERTION;  Surgeon: Evans Lance, MD;  Location: [place] CV LAB;  Service: Cardiovascular;  Laterality: N/A;   PERCUTANEOUS CORONARY STENT INTERVENTION (PCI-S)     RIGHT/LEFT HEART CATH AND CORONARY ANGIOGRAPHY N/A 08/22/2020   Procedure: RIGHT/LEFT HEART CATH  AND CORONARY ANGIOGRAPHY;  Surgeon: Marykay Lex, MD;  Location: [facility] INVASIVE CV LAB;  Service: Cardiovascular;  Laterality: N/A;   TEE WITHOUT CARDIOVERSION N/A 09/24/2020   Procedure: TRANSESOPHAGEAL ECHOCARDIOGRAM (TEE);  Surgeon: Tonny Bollman, MD;  Location: [facility] OR;  Service: Open Heart Surgery;  Laterality: N/A;   TRANSCATHETER AORTIC VALVE REPLACEMENT, TRANSFEMORAL N/A 09/24/2020   Procedure: TRANSCATHETER AORTIC VALVE REPLACEMENT, TRANSFEMORAL - USING EDWARDS SAPIEN 3 ULTRA  VALVE SIZE ;   Surgeon: Tonny Bollman, MD;  Location: [facility] OR;  Service: Open Heart Surgery;  Laterality: N/A;    Current Medications: Current Meds  Medication Sig   acetaminophen (TYLENOL) 650 MG CR tablet Take 325 mg by mouth as needed for pain.   atorvastatin (LIPITOR) 40 MG tablet Take 1 tablet (40 mg total) by mouth daily at 6 PM.   calcium carbonate (TUMS - DOSED IN MG ELEMENTAL CALCIUM) 500 MG chewable tablet Chew 1-2 tablets by mouth 3 (three) times daily as needed for indigestion or heartburn.   clopidogrel (PLAVIX) 75 MG tablet Take 1 tablet by mouth once daily (Patient taking differently: Take 75 mg by mouth daily.)   DULoxetine (CYMBALTA) 20 MG capsule Take 20 mg by mouth daily.   finasteride (PROSCAR) 5 MG tablet Take 5 mg by mouth every evening.    fluticasone (FLONASE) 50 MCG/ACT nasal spray Place 2 sprays into both nostrils daily as needed for allergies or rhinitis.   lisinopril (ZESTRIL) 2.5 MG tablet Take 1 tablet by mouth once daily (Patient taking differently: Take 2.5 mg by mouth daily.)   Naphazoline-Glycerin (CLEAR EYES MAX REDNESS RELIEF) 0.03-0.5 % SOLN Place 1-2 drops into both eyes 3 (three) times daily as needed (redness relief).   [DISCONTINUED] doxazosin (CARDURA) 1 MG tablet Take 1 tablet by mouth daily after supper.   [DISCONTINUED] lisinopril (ZESTRIL) 2.5 MG tablet Take 1 tablet by mouth once daily (Patient taking differently: Take 2.5 mg by mouth daily.)   [DISCONTINUED] omeprazole (PRILOSEC) 20 MG capsule Take 20 mg by mouth daily.      Allergies:   Patient has no known allergies.   Social History   Socioeconomic History   Marital status: Married    Spouse name: Not on file   Number of children: Not on file   Years of education: Not on file   Highest education level: Not on file  Occupational History   Not on file  Tobacco Use   Smoking status: Former   Smokeless tobacco: Never  Vaping Use   Vaping Use: Never used  Substance and Sexual Activity   Alcohol  use: Not Currently   Drug use: Never   Sexual activity: Not on file  Other Topics Concern   Not on file  Social History Narrative   Not on file   Social Determinants of Health   Financial Resource Strain: Not on file  Food Insecurity: Not on file  Transportation Needs: Not on file  Physical Activity: Not on file  Stress: Not on file  Social Connections: Not on file     Family History: The patient's family history includes Diabetes in his mother; Stroke in his father. ROS:   Please see the history of present illness.    All 14 point review of systems negative except as described per history of present illness  EKGs/Labs/Other Studies Reviewed:      Recent Labs: No results found for requested labs within last 365 days.  Recent Lipid Panel    Component Value Date/Time   CHOL  124 08/27/2019 0532   TRIG 113 08/27/2019 0532   HDL 30 (L) 08/27/2019 0532   CHOLHDL 4.1 08/27/2019 0532   VLDL 23 08/27/2019 0532   LDLCALC 71 08/27/2019 0532    Physical Exam:    VS:  BP 130/88 (BP Location: Left Arm, Patient Position: Sitting)   Pulse 76   Ht 5\' 10"  (1.778 m)   Wt 208 lb 9.6 oz (94.6 kg)   SpO2 94%   BMI 29.93 kg/m     Wt Readings from Last 3 Encounters:  08/25/22 208 lb 9.6 oz (94.6 kg)  11/20/21 211 lb (95.7 kg)  05/15/21 209 lb (94.8 kg)     GEN:  Well nourished, well developed in no acute distress HEENT: Normal NECK: No JVD; No carotid bruits LYMPHATICS: No lymphadenopathy CARDIAC: RRR, no murmurs, no rubs, no gallops RESPIRATORY:  Clear to auscultation without rales, wheezing or rhonchi  ABDOMEN: Soft, non-tender, non-distended MUSCULOSKELETAL:  No edema; No deformity  SKIN: Warm and dry LOWER EXTREMITIES: no swelling NEUROLOGIC:  Alert and oriented x 3 PSYCHIATRIC:  Normal affect   ASSESSMENT:    1. Dyspnea on exertion   2. Chronic ischemic heart disease   3. Coronary artery disease involving native coronary artery of native heart without angina  pectoris   4. Essential hypertension   5. S/P TAVR (transcatheter aortic valve replacement)   6. Late effect of cerebrovascular accident (CVA)    PLAN:    In order of problems listed above:  Status post type B.  Will check his follow-up with echocardiogram done in December.  Overall she does not have any symptomatology suggesting worsening of his aortic stenosis. Chronic ischemic heart disease.  Stable on appropriate medications which I will continue. Essential hypertension blood pressure well controlled continue present management Dyslipidemia I did review his K PN which show me his LDL of 71 HDL 30.  He is on Lipitor 40 which I will continue   Medication Adjustments/Labs and Tests Ordered: Current medicines are reviewed at length with the patient today.  Concerns regarding medicines are outlined above.  Orders Placed This Encounter  Procedures   EKG 12-Lead   ECHOCARDIOGRAM COMPLETE   Medication changes: No orders of the defined types were placed in this encounter.   Signed, January, MD, [facility] 08/25/2022 11:54 AM    [facility]

## 2022-08-25 NOTE — Patient Instructions (Addendum)

## 2022-09-01 ENCOUNTER — Ambulatory Visit: Payer: Medicare HMO | Admitting: Cardiology

## 2022-09-02 NOTE — Progress Notes (Signed)
Carelink Summary Report / Loop Recorder 

## 2022-09-09 LAB — CUP PACEART REMOTE DEVICE CHECK
Date Time Interrogation Session: 20231003110146
Implantable Pulse Generator Implant Date: 20200921

## 2022-09-14 ENCOUNTER — Ambulatory Visit (INDEPENDENT_AMBULATORY_CARE_PROVIDER_SITE_OTHER): Payer: Medicare HMO

## 2022-09-14 DIAGNOSIS — I63412 Cerebral infarction due to embolism of left middle cerebral artery: Secondary | ICD-10-CM

## 2022-09-23 NOTE — Progress Notes (Signed)
Carelink Summary Report / Loop Recorder 

## 2022-10-17 LAB — CUP PACEART REMOTE DEVICE CHECK
Date Time Interrogation Session: 20231104231216
Implantable Pulse Generator Implant Date: 20200921

## 2022-10-19 ENCOUNTER — Ambulatory Visit (INDEPENDENT_AMBULATORY_CARE_PROVIDER_SITE_OTHER): Payer: Medicare HMO

## 2022-10-19 DIAGNOSIS — I63412 Cerebral infarction due to embolism of left middle cerebral artery: Secondary | ICD-10-CM

## 2022-11-23 ENCOUNTER — Ambulatory Visit (INDEPENDENT_AMBULATORY_CARE_PROVIDER_SITE_OTHER): Payer: Medicare HMO

## 2022-11-23 DIAGNOSIS — I63412 Cerebral infarction due to embolism of left middle cerebral artery: Secondary | ICD-10-CM | POA: Diagnosis not present

## 2022-11-24 ENCOUNTER — Ambulatory Visit: Payer: Medicare HMO | Attending: Cardiology

## 2022-11-24 DIAGNOSIS — I259 Chronic ischemic heart disease, unspecified: Secondary | ICD-10-CM

## 2022-11-24 DIAGNOSIS — R0609 Other forms of dyspnea: Secondary | ICD-10-CM | POA: Diagnosis not present

## 2022-11-24 LAB — CUP PACEART REMOTE DEVICE CHECK
Date Time Interrogation Session: 20231217231127
Implantable Pulse Generator Implant Date: 20200921

## 2022-11-24 LAB — ECHOCARDIOGRAM COMPLETE
AR max vel: 1.04 cm2
AV Area VTI: 0.95 cm2
AV Area mean vel: 1.01 cm2
AV Mean grad: 12 mmHg
AV Peak grad: 20.6 mmHg
Ao pk vel: 2.27 m/s
Area-P 1/2: 2.91 cm2
P 1/2 time: 722 msec
S' Lateral: 3.9 cm

## 2022-11-26 ENCOUNTER — Telehealth: Payer: Self-pay

## 2022-11-26 NOTE — Telephone Encounter (Signed)
LVM per DPR- per Dr. Krasowski's note regarding normal Echo results. Encouraged to call with any questions. Routed to PCP. 

## 2022-12-01 NOTE — Progress Notes (Signed)
Carelink Summary Report / Loop Recorder 

## 2022-12-28 ENCOUNTER — Ambulatory Visit: Payer: Medicare HMO | Attending: Internal Medicine

## 2022-12-28 DIAGNOSIS — I63412 Cerebral infarction due to embolism of left middle cerebral artery: Secondary | ICD-10-CM | POA: Diagnosis not present

## 2022-12-28 NOTE — Progress Notes (Signed)
[facility] Summary Report / Loop Recorder

## 2022-12-29 LAB — CUP PACEART REMOTE DEVICE CHECK
Date Time Interrogation Session: 20240119230439
Implantable Pulse Generator Implant Date: 20200921

## 2023-01-04 DIAGNOSIS — I6389 Other cerebral infarction: Secondary | ICD-10-CM

## 2023-02-01 ENCOUNTER — Ambulatory Visit: Payer: Medicare HMO

## 2023-02-01 DIAGNOSIS — I63412 Cerebral infarction due to embolism of left middle cerebral artery: Secondary | ICD-10-CM

## 2023-02-02 LAB — CUP PACEART REMOTE DEVICE CHECK
Date Time Interrogation Session: 20240221231436
Implantable Pulse Generator Implant Date: 20200921

## 2023-02-12 NOTE — Progress Notes (Signed)
Carelink Summary Report / Loop Recorder 

## 2023-02-18 ENCOUNTER — Other Ambulatory Visit: Payer: Self-pay

## 2023-02-23 ENCOUNTER — Ambulatory Visit: Payer: Medicare HMO | Attending: Cardiology | Admitting: Cardiology

## 2023-02-23 ENCOUNTER — Encounter: Payer: Self-pay | Admitting: Cardiology

## 2023-02-23 ENCOUNTER — Ambulatory Visit: Payer: Medicare HMO | Admitting: Cardiology

## 2023-02-23 VITALS — BP 182/90 | HR 82 | Ht 69.0 in | Wt 202.6 lb

## 2023-02-23 DIAGNOSIS — Z952 Presence of prosthetic heart valve: Secondary | ICD-10-CM | POA: Diagnosis not present

## 2023-02-23 DIAGNOSIS — I10 Essential (primary) hypertension: Secondary | ICD-10-CM

## 2023-02-23 DIAGNOSIS — I2510 Atherosclerotic heart disease of native coronary artery without angina pectoris: Secondary | ICD-10-CM

## 2023-02-23 NOTE — Progress Notes (Signed)
Cardiology Office Note:    Date:  02/23/2023   ID:  Michael Kidd, DOB 01/06/1930, MRN [id number]  PCP:  Jene Every, MD  Cardiologist:  Jenne Campus, MD    Referring MD: Jene Every, MD   No chief complaint on file. Had a stroke  History of Present Illness:    ISMEAL WEHRS is a 89 y.o. male past medical history significant for severe arctic stenosis status post TAVI done on 09/24/2020 with 26 mm SAPIEN 3 Edwards valve.  Left bundle branch block, he does have implantable loop recorder which is [employer] device so far no significant arrhythmia.  He does have remote history of CVA, nonsustained ventricular tachycardia essential hypertension, coronary artery disease but no recent problems.  Recently in January he end up being in the hospital because of confusion he was found to have acute thalamic stroke on the left side.  Managed by neurology, echocardiogram performed showing normally functioning valve, his carotic artery however without significant stenosis that would explain his CVA.  Since that time he is doing quite fair he complained of having problem with the memory.  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 PHQ-9 completed 02/26/14 PHQ-2 completed 02/14/14   Dizziness 03/12/2014   Dupuytren's contracture 07/15/2016   Dyspnea on exertion 08/15/2020   Dysthymia    Essential hypertension 03/12/2014   Flexor tendon  bowstring 01/06/2016   Gastroesophageal reflux disease 08/18/2016   Hepatic steatosis 03/02/2014   History of CVA (cerebrovascular accident) 10/12/2019   HLD (hyperlipidemia) 02/14/2014   HTN (hypertension)    Late effect of cerebrovascular accident (CVA) 10/19/2019   Melanoma in situ of right upper extremity including shoulder ([place]) 08/07/2016   Formatting of this note might be different from the original. August 2017.  Dr. Mare Ferrari, [facility].   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   Short of breath on exertion    per patient   Stroke [facility])    November   Symptomatic severe aortic stenosis with normal ejection fraction 08/15/2020    Past Surgical History:  Procedure Laterality Date   CATARACT EXTRACTION W/ INTRAOCULAR LENS  IMPLANT, BILATERAL     cataract repair     HERNIA REPAIR     unsure of date   LOOP RECORDER INSERTION N/A 08/28/2019   Procedure: LOOP RECORDER INSERTION;  Surgeon: Evans Lance, MD;  Location: [place] CV LAB;  Service: Cardiovascular;  Laterality: N/A;   PERCUTANEOUS CORONARY STENT INTERVENTION (PCI-S)     RIGHT/LEFT HEART CATH AND CORONARY ANGIOGRAPHY N/A 08/22/2020   Procedure: RIGHT/LEFT HEART CATH AND CORONARY ANGIOGRAPHY;  Surgeon: Leonie Man,  MD;  Location: [place] CV LAB;  Service: Cardiovascular;  Laterality: N/A;   TEE WITHOUT CARDIOVERSION N/A 09/24/2020   Procedure: TRANSESOPHAGEAL ECHOCARDIOGRAM (TEE);  Surgeon: Sherren Mocha, MD;  Location: [place];  Service: Open Heart Surgery;  Laterality: N/A;   TRANSCATHETER AORTIC VALVE REPLACEMENT, TRANSFEMORAL N/A 09/24/2020   Procedure: TRANSCATHETER AORTIC VALVE REPLACEMENT, TRANSFEMORAL - USING  EDWARDS SAPIEN 3 ULTRA  VALVE SIZE 26MM;  Surgeon: Sherren Mocha, MD;  Location: [place];  Service: Open Heart Surgery;  Laterality: N/A;    Current Medications: No outpatient medications have been marked as taking for the 02/23/23 encounter (Office Visit) with Park Liter, MD.     Allergies:   Patient has no known allergies.   Social History   Socioeconomic History   Marital status: Married    Spouse name: Not on file   Number of children: Not on file   Years of education: Not on file   Highest education level: Not on file  Occupational History   Not on file  Tobacco Use   Smoking status: Former   Smokeless tobacco: Never  Vaping Use   Vaping Use: Never used  Substance and Sexual Activity   Alcohol use: Not Currently   Drug use: Never   Sexual activity: Not on file  Other Topics Concern   Not on file  Social History Narrative   Not on file   Social Determinants of Health   Financial Resource Strain: Not on file  Food Insecurity: Not on file  Transportation Needs: Not on file  Physical Activity: Not on file  Stress: Not on file  Social Connections: Not on file     Family History: The patient's family history includes Diabetes in his mother; Stroke in his father. ROS:   Please see the history of present illness.    All 14 point review of systems negative except as described per history of present illness  EKGs/Labs/Other Studies Reviewed:      Recent Labs: No results found for requested labs within last 365 days.  Recent Lipid Panel    Component Value Date/Time   CHOL 124 08/27/2019 0532   TRIG 113 08/27/2019 0532   HDL 30 (L) 08/27/2019 0532   CHOLHDL 4.1 08/27/2019 0532   VLDL 23 08/27/2019 0532   LDLCALC 71 08/27/2019 0532    Physical Exam:    VS:  BP (!) 182/90   Pulse 82   Ht 5\' 9"  (1.753 m)   Wt 202 lb 9.6 oz (91.9 kg)   SpO2 95%   BMI 29.92 kg/m     Wt Readings from Last 3 Encounters:  02/23/23 202 lb 9.6 oz (91.9 kg)   08/25/22 208 lb 9.6 oz (94.6 kg)  11/20/21 211 lb (95.7 kg)     GEN:  Well nourished, well developed in no acute distress HEENT: Normal NECK: No JVD; No carotid bruits LYMPHATICS: No lymphadenopathy CARDIAC: RRR, no murmurs, no rubs, no gallops RESPIRATORY:  Clear to auscultation without rales, wheezing or rhonchi  ABDOMEN: Soft, non-tender, non-distended MUSCULOSKELETAL:  No edema; No deformity  SKIN: Warm and dry LOWER EXTREMITIES: no swelling NEUROLOGIC:  Alert and oriented x 3 PSYCHIATRIC:  Normal affect   ASSESSMENT:    1. S/P TAVR (transcatheter aortic valve replacement)   2. Coronary artery disease involving native coronary artery of native heart without angina pectoris   3. Essential hypertension    PLAN:    In order of problems listed above:  Status post TAVI normal function  based on last echocardiogram done in January continue present management. Coronary disease stable without any events on antiplatelet therapy which I will continue. Supples recent CVA.  Followed by neurology.  Stable on appropriate medications. Dyslipidemia I did review his K PN which show me his last cholesterol done 11 months ago with HDL 34 LDL 66.  Will continue high intense statin for of Lipitor 40.    Medication Adjustments/Labs and Tests Ordered: Current medicines are reviewed at length with the patient today.  Concerns regarding medicines are outlined above.  No orders of the defined types were placed in this encounter.  Medication changes: No orders of the defined types were placed in this encounter.   Signed, Park Liter, MD, [facility] 02/23/2023 3:48 PM    [place]

## 2023-02-23 NOTE — Patient Instructions (Signed)
Medication Instructions:  Your physician recommends that you continue on your current medications as directed. Please refer to the Current Medication list given to you today.  *If you need a refill on your cardiac medications before your next appointment, please call your pharmacy*   Lab Work: None If you have labs (blood work) drawn today and your tests are completely normal, you will receive your results only by: MyChart Message (if you have MyChart) OR A paper copy in the mail If you have any lab test that is abnormal or we need to change your treatment, we will call you to review the results.   Testing/Procedures: None   Follow-Up: At [facility], you and your health needs are our priority.  As part of our continuing mission to provide you with exceptional heart care, we have created designated Provider Care Teams.  These Care Teams include your primary Cardiologist (physician) and Advanced Practice Providers (APPs -  Physician Assistants and Nurse Practitioners) who all work together to provide you with the care you need, when you need it.  We recommend signing up for the patient portal called "MyChart".  Sign up information is provided on this After Visit Summary.  MyChart is used to connect with patients for Virtual Visits (Telemedicine).  Patients are able to view lab/test results, encounter notes, upcoming appointments, etc.  Non-urgent messages can be sent to your provider as well.   To learn more about what you can do with MyChart, go to [email or web address].    Your next appointment:   6 month(s)  Provider:   Robert Krasowski, MD    Other Instructions None  

## 2023-03-08 ENCOUNTER — Ambulatory Visit (INDEPENDENT_AMBULATORY_CARE_PROVIDER_SITE_OTHER): Payer: Medicare HMO

## 2023-03-08 DIAGNOSIS — I63412 Cerebral infarction due to embolism of left middle cerebral artery: Secondary | ICD-10-CM

## 2023-03-09 LAB — CUP PACEART REMOTE DEVICE CHECK
Date Time Interrogation Session: 20240331231654
Implantable Pulse Generator Implant Date: 20200921

## 2023-03-12 NOTE — Progress Notes (Signed)
Carelink Summary Report / Loop Recorder 

## 2023-03-23 ENCOUNTER — Ambulatory Visit: Payer: Medicare HMO | Admitting: Neurology

## 2023-03-23 ENCOUNTER — Encounter: Payer: Self-pay | Admitting: Neurology

## 2023-03-23 VITALS — BP 168/81 | HR 68 | Ht 71.0 in | Wt 203.0 lb

## 2023-03-23 DIAGNOSIS — I6381 Other cerebral infarction due to occlusion or stenosis of small artery: Secondary | ICD-10-CM | POA: Diagnosis not present

## 2023-03-23 DIAGNOSIS — Z8673 Personal history of transient ischemic attack (TIA), and cerebral infarction without residual deficits: Secondary | ICD-10-CM | POA: Diagnosis not present

## 2023-03-23 DIAGNOSIS — R413 Other amnesia: Secondary | ICD-10-CM

## 2023-03-23 MED ORDER — MEMANTINE HCL 10 MG PO TABS: 10.0000 mg | ORAL_TABLET | Freq: Two times a day (BID) | ORAL | 3 refills | Status: DC

## 2023-03-23 MED ORDER — MEMANTINE HCL 28 X 5 MG & 21 X 10 MG PO TABS: ORAL_TABLET | ORAL | 12 refills | Status: DC

## 2023-03-23 NOTE — Patient Instructions (Signed)
I had a long d/w patient and his grandson about his recent stroke, memory loss and dementia, risk for recurrent stroke/TIAs, personally independently reviewed imaging studies and stroke evaluation results and answered questions.Continue Plavix 75 mg daily alone and discontinue aspirin for secondary stroke prevention and maintain strict control of hypertension with blood pressure goal below 130/90, diabetes with hemoglobin A1c goal below 6.5% and lipids with LDL cholesterol goal below 70 mg/dL. I also advised the patient to eat a healthy diet with plenty of whole grains, cereals, fruits and vegetables, exercise regularly and maintain ideal body weight .trial of Namenda starter pack to help with memory loss increase as tolerated and then 10 mg twice daily.  Patient was advised not to drive as his cognitive impairment is clearly impaired his ability to drive safely.  He was advised to use his walker at all times for ambulation and for for safety.  Followup in the future with me in 4 months or call earlier if necessary.  Stroke Prevention Some medical conditions and behaviors can lead to a higher chance of having a stroke. You can help prevent a stroke by eating healthy, exercising, not smoking, and managing any medical conditions you have. Stroke is a leading cause of functional impairment. Primary prevention is particularly important because a majority of strokes are first-time events. Stroke changes the lives of not only those who experience a stroke but also their family and other caregivers. How can this condition affect me? A stroke is a medical emergency and should be treated right away. A stroke can lead to brain damage and can sometimes be life-threatening. If a person gets medical treatment right away, there is a better chance of surviving and recovering from a stroke. What can increase my risk? The following medical conditions may increase your risk of a stroke: Cardiovascular disease. High blood  pressure (hypertension). Diabetes. High cholesterol. Sickle cell disease. Blood clotting disorders (hypercoagulable state). Obesity. Sleep disorders (obstructive sleep apnea). Other risk factors include: Being older than age 7. Having a history of blood clots, stroke, or mini-stroke (transient ischemic attack, TIA). Genetic factors, such as race, ethnicity, or a family history of stroke. Smoking cigarettes or using other tobacco products. Taking birth control pills, especially if you also use tobacco. Heavy use of alcohol or drugs, especially cocaine and methamphetamine. Physical inactivity. What actions can I take to prevent this? Manage your health conditions High cholesterol levels. Eating a healthy diet is important for preventing high cholesterol. If cholesterol cannot be managed through diet alone, you may need to take medicines. Take any prescribed medicines to control your cholesterol as told by your health care provider. Hypertension. To reduce your risk of stroke, try to keep your blood pressure below 130/80. Eating a healthy diet and exercising regularly are important for controlling blood pressure. If these steps are not enough to manage your blood pressure, you may need to take medicines. Take any prescribed medicines to control hypertension as told by your health care provider. Ask your health care provider if you should monitor your blood pressure at home. Have your blood pressure checked every year, even if your blood pressure is normal. Blood pressure increases with age and some medical conditions. Diabetes. Eating a healthy diet and exercising regularly are important parts of managing your blood sugar (glucose). If your blood sugar cannot be managed through diet and exercise, you may need to take medicines. Take any prescribed medicines to control your diabetes as told by your health care provider. Get evaluated  for obstructive sleep apnea. Talk to your health care  provider about getting a sleep evaluation if you snore a lot or have excessive sleepiness. Make sure that any other medical conditions you have, such as atrial fibrillation or atherosclerosis, are managed. Nutrition Follow instructions from your health care provider about what to eat or drink to help manage your health condition. These instructions may include: Reducing your daily calorie intake. Limiting how much salt (sodium) you use to 1,500 milligrams (mg) each day. Using only healthy fats for cooking, such as olive oil, canola oil, or sunflower oil. Eating healthy foods. You can do this by: Choosing foods that are high in fiber, such as whole grains, and fresh fruits and vegetables. Eating at least 5 servings of fruits and vegetables a day. Try to fill one-half of your plate with fruits and vegetables at each meal. Choosing lean protein foods, such as lean cuts of meat, poultry without skin, fish, tofu, beans, and nuts. Eating low-fat dairy products. Avoiding foods that are high in sodium. This can help lower blood pressure. Avoiding foods that have saturated fat, trans fat, and cholesterol. This can help prevent high cholesterol. Avoiding processed and prepared foods. Counting your daily carbohydrate intake.  Lifestyle If you drink alcohol: Limit how much you have to: 0-1 drink a day for women who are not pregnant. 0-2 drinks a day for men. Know how much alcohol is in your drink. In the [place] one drink equals one 12 oz bottle of beer ( ), one 5 oz glass of wine ( ), or one 1 oz glass of hard liquor (87mL). Do not use any products that contain nicotine or tobacco. These products include cigarettes, chewing tobacco, and vaping devices, such as e-cigarettes. If you need help quitting, ask your health care provider. Avoid secondhand smoke. Do not use drugs. Activity  Try to stay at a healthy weight. Get at least 30 minutes of exercise on most days, such as: Fast  walking. Biking. Swimming. Medicines Take over-the-counter and prescription medicines only as told by your health care provider. Aspirin or blood thinners (antiplatelets or anticoagulants) may be recommended to reduce your risk of forming blood clots that can lead to stroke. Avoid taking birth control pills. Talk to your health care provider about the risks of taking birth control pills if: You are over 26 years old. You smoke. You get very bad headaches. You have had a blood clot. Where to find more information American Stroke Association: www.strokeassociation.org Get help right away if: You or a loved one has any symptoms of a stroke. "BE FAST" is an easy way to remember the main warning signs of a stroke: B - Balance. Signs are dizziness, sudden trouble walking, or loss of balance. E - Eyes. Signs are trouble seeing or a sudden change in vision. F - Face. Signs are sudden weakness or numbness of the face, or the face or eyelid drooping on one side. A - Arms. Signs are weakness or numbness in an arm. This happens suddenly and usually on one side of the body. S - Speech. Signs are sudden trouble speaking, slurred speech, or trouble understanding what people say. T - Time. Time to call emergency services. Write down what time symptoms started. You or a loved one has other signs of a stroke, such as: A sudden, severe headache with no known cause. Nausea or vomiting. Seizure. These symptoms may represent a serious problem that is an emergency. Do not wait to see if the symptoms will go  away. Get medical help right away. Call your local emergency services (911 in the [place] Do not drive yourself to the hospital. Summary You can help to prevent a stroke by eating healthy, exercising, not smoking, limiting alcohol intake, and managing any medical conditions you may have. Do not use any products that contain nicotine or tobacco. These include cigarettes, chewing tobacco, and vaping devices,  such as e-cigarettes. If you need help quitting, ask your health care provider. Remember "BE FAST" for warning signs of a stroke. Get help right away if you or a loved one has any of these signs. This information is not intended to replace advice given to you by your health care provider. Make sure you discuss any questions you have with your health care provider. Document Revised: 06/06/2020 Document Reviewed: 06/24/2020 Elsevier Patient Education  2023 [employer].

## 2023-03-23 NOTE — Progress Notes (Signed)
[facility] [address] [place]. [place] [address] [phone]       OFFICE CONSULT NOTE  Michael Kidd Date of Birth:  1929-12-12 Medical Record Number:  [id number]   Referring MD: Barron Alvine  Reason for Referral: Stroke  HPI: Michael Kidd is a 89 year old pleasant Caucasian male seen today for initial office consultation visit for stroke.  He is accompanied by his grandson.  History is obtained from them and review of referral notes and I personally reviewed available pertinent imaging films in PACS.  He has a past medical history of hypertension, hyperlipidemia, gastroesophageal reflux disease, prior stroke in November 2020, coronary artery disease, arthritis and aortic stenosis.  He presented to [facility] in January 2024 with confusion and disorientation.  Admission labs were unremarkable he had only low-grade fever and blood pressure slightly elevated 177/86.  UA was normal and respiratory panel negative.  Chest x-ray was clear.  CT head showed no acute abnormality.  MRI scan showed an acute left thalamic lacunar infarct.  He was seen by teleneurology recommended dual antiplatelet therapy of aspirin and Plavix for 21 days and then.  He was seen by therapy recommended home physical and Occupational Therapy at discharge.  B12 levels and thyroid hormone levels.  Echocardiogram showed ejection fraction of 55 to 60%.  Carotid ultrasound showed no significant  CT angiogram of the brain and neck showed no significant large vessel stenosis or occlusion.  LDL cholesterol was 152 mg percent. Patient has finished home physical Occupational Therapy.  Lives at home with his wife.  He was previously walking using a stick and now is walking with a walker.  He is still confused and has not returned back to his baseline and is having memory and cognitive difficulties per the grandson.  The patient himself is unable to give any meaningful history does not remember a lot of things  and was not fully cooperative or able to complete the Mini-Mental status testing exam.  He apparently was driving prior to the stroke and had some mild memory difficulties but had never been evaluated for it.  He has had no recurrent stroke or TIA symptoms.  He is tolerating aspirin and Plavix well with only minor bruising and no bleeding.  His blood pressure is well-controlled.  He is tolerating Lipitor well without muscle aches and pains. 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 PHQ-9 completed 02/26/14 PHQ-2 completed 02/14/14   Dizziness 03/12/2014   Dupuytren's contracture 07/15/2016   Dyspnea on exertion 08/15/2020   Dysthymia    Essential hypertension 03/12/2014   Flexor tendon bowstring 01/06/2016   Gastroesophageal reflux disease 08/18/2016   Hepatic steatosis 03/02/2014   History of CVA (cerebrovascular accident) 10/12/2019   HLD (hyperlipidemia) 02/14/2014  HTN (hypertension)    Late effect of cerebrovascular accident (CVA) 10/19/2019   Melanoma in situ of right upper extremity including shoulder (HCC) 08/07/2016   Formatting of this note might be different from the original. August 2017.  Dr. Chancy Milroy, [facility].   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   Short of breath on exertion    per patient   Stroke [facility])    November   Symptomatic severe aortic stenosis with normal ejection fraction 08/15/2020    Social History:  Social History   Socioeconomic History   Marital status: Married    Spouse name: Not on file   Number of children: Not on file   Years of education: Not on file   Highest education level: Not on file  Occupational History   Not on file  Tobacco Use   Smoking status: Former   Smokeless tobacco: Never  Vaping Use   Vaping Use: Never used  Substance and Sexual Activity   Alcohol use: Not Currently   Drug use: Never   Sexual activity: Not on file  Other Topics Concern   Not on file  Social History Narrative   Not on file   Social Determinants of Health   Financial Resource Strain: Not on file  Food Insecurity: Not on file  Transportation Needs: Not on file  Physical Activity: Not on file  Stress: Not on file  Social Connections: Not on file  Intimate Partner Violence: Not on file    Medications:   Current Outpatient Medications on File Prior to Visit  Medication Sig Dispense Refill   acetaminophen (TYLENOL) 650 MG CR tablet Take 325 mg by mouth as needed for pain.     atorvastatin (LIPITOR) 40 MG tablet Take 1 tablet (40 mg total) by mouth daily at 6 PM. 30 tablet 11   clopidogrel (PLAVIX) 75 MG tablet Take 1 tablet by mouth once daily (Patient taking differently: Take 75 mg by mouth daily.) 90 tablet 0   lisinopril (ZESTRIL) 2.5 MG tablet Take 1 tablet by mouth once daily 30 tablet 0   aspirin 81 MG chewable tablet Chew 81 mg by mouth daily. (Patient not taking: Reported on 03/23/2023)     calcium carbonate (TUMS - DOSED IN MG ELEMENTAL CALCIUM) 500 MG  chewable tablet Chew 1-2 tablets by mouth 3 (three) times daily as needed for indigestion or heartburn. (Patient not taking: Reported on 03/23/2023)     DULoxetine (CYMBALTA) 20 MG capsule Take 20 mg by mouth daily. (Patient not taking: Reported on 03/23/2023)     finasteride (PROSCAR) 5 MG tablet Take 5 mg by mouth every evening.  (Patient not taking: Reported on 03/23/2023)     fluticasone (FLONASE) 50 MCG/ACT nasal spray Place 2 sprays into both nostrils daily as needed for allergies or rhinitis. (Patient not taking: Reported on 03/23/2023)     mirtazapine (REMERON) 7.5 MG tablet Take 7.5 mg by mouth at bedtime. (Patient not taking: Reported on 03/23/2023)     Naphazoline-Glycerin (CLEAR EYES MAX REDNESS RELIEF) 0.03-0.5 % SOLN Place 1-2 drops into both eyes 3 (three) times daily as needed (redness relief). (Patient not taking: Reported on 03/23/2023)     venlafaxine XR (EFFEXOR-XR) 37.5 MG 24  hr capsule Take 37.5 mg by mouth daily. (Patient not taking: Reported on 03/23/2023)     No current facility-administered medications on file prior to visit.    Allergies:  No Known Allergies  Physical Exam General: Frail elderly Caucasian male, seated, in no evident distress Head: head normocephalic and atraumatic.   Neck: supple with no carotid or supraclavicular bruits Cardiovascular: regular rate and rhythm, no murmurs Musculoskeletal: no deformity Skin:  no rash/petichiae Vascular:  Normal pulses all extremities  Neurologic Exam Mental Status: Awake and fully alert. Oriented to person only. Recent and remote memory poor. Attention span, concentration and fund of knowledge diminished. Mood and affect appropriate.  Patient not very cooperative for cognitive testing but slowed/30 on Mini-Mental status.  Clock drawing 1/4.  Unable to copy intersecting pentagons. Cranial Nerves: Fundoscopic exam reveals sharp disc margins. Pupils equal, briskly reactive to light. Extraocular movements full without  nystagmus. Visual fields full to confrontation. Hearing diminished bilaterally. Facial sensation intact. Face, tongue, palate moves normally and symmetrically.  Motor: Normal bulk and tone. Normal strength in all tested extremity muscles. Sensory.: intact to touch , pinprick , position and vibratory sensation.  Coordination: Rapid alternating movements normal in all extremities. Finger-to-nose and heel-to-shin performed accurately bilaterally. Gait and Station: Arises from chair without difficulty. Stance is normal. Gait uses a walker and is slightly broad-based and cautious.   Reflexes: 1+ and symmetric. Toes downgoing.   NIHSS  4 Modified Rankin  3    03/23/2023   10:17 AM  MMSE - Mini Mental State Exam  Orientation to time 1  Orientation to Place 3  Registration 0  Attention/ Calculation 0  Recall 0  Language- name 2 objects 2  Language- repeat 0  Language- follow 3 step command 3  Language- read & follow direction 1  Write a sentence 0  Copy design 0  Total score 10     ASSESSMENT: 89 year old Caucasian male with episode of confusion disorientation and cognitive impairment in January 2024 due to anterior left thalamic lacunar infarct from small vessel disease.  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     PLAN:I had a long d/w patient and his grandson about his recent stroke, memory loss and dementia, risk for recurrent stroke/TIAs, personally independently reviewed imaging studies and stroke evaluation results and answered questions.Continue Plavix 75 mg daily alone and discontinue aspirin for secondary stroke prevention and maintain strict control of hypertension with blood pressure goal below 130/90, diabetes with hemoglobin A1c goal below 6.5% and lipids with LDL cholesterol goal below 70 mg/dL. I also advised the patient to eat a healthy diet with plenty of whole grains, cereals, fruits and vegetables,  exercise regularly and maintain ideal body weight .trial of Namenda starter pack to help with memory loss increase as tolerated and then 10 mg twice daily.His wife wants him to drive because she cannot drive to go out .I recommend he use his walker at all times for ambulation and for for safety.  Followup in the future with me in 4 months or call earlier if necessary.  Greater than 50% time during this 45-minute consultation visit was spent on counseling and coordination of care about his lacunar stroke and memory loss and cognitive impairment planning treatment and answering questions.  Delia Heady, MD Note: This document was prepared with digital dictation and possible smart phrase technology. Any transcriptional errors that result from this process are unintentional.

## 2023-03-25 ENCOUNTER — Ambulatory Visit: Payer: Medicare HMO | Admitting: Cardiology

## 2023-03-25 ENCOUNTER — Telehealth: Payer: Self-pay | Admitting: Neurology

## 2023-03-25 NOTE — Telephone Encounter (Signed)
Pt wife called LVM that she needs to speak with nurse about memantine (NAMENDA TITRATION PAK) tablet pack.

## 2023-03-25 NOTE — Telephone Encounter (Signed)
Contact pt wife back, she stated he accidentally took 10MG  of namenda yesterday and today instead of the titration pack that was prescribed.  I advised her starting tomorrow, take it as directed so he wont have a upset stomach or side effects. She verbally understood and was apperceived.

## 2023-03-25 NOTE — Telephone Encounter (Signed)
Pt's grandson Dannielle Huh called needing to speak to the MD or RN regarding the memantine (NAMENDA TITRATION PAK) tablet pack that was prescribed to the pt. Please advise.

## 2023-04-11 DIAGNOSIS — R2681 Unsteadiness on feet: Secondary | ICD-10-CM | POA: Insufficient documentation

## 2023-04-11 DIAGNOSIS — R413 Other amnesia: Secondary | ICD-10-CM | POA: Insufficient documentation

## 2023-04-12 ENCOUNTER — Ambulatory Visit (INDEPENDENT_AMBULATORY_CARE_PROVIDER_SITE_OTHER): Payer: Medicare HMO

## 2023-04-12 DIAGNOSIS — I63412 Cerebral infarction due to embolism of left middle cerebral artery: Secondary | ICD-10-CM

## 2023-04-12 LAB — CUP PACEART REMOTE DEVICE CHECK
Date Time Interrogation Session: 20240503230621
Implantable Pulse Generator Implant Date: 20200921

## 2023-04-13 NOTE — Progress Notes (Signed)
Carelink Summary Report / Loop Recorder 

## 2023-05-10 NOTE — Progress Notes (Signed)
Carelink Summary Report / Loop Recorder 

## 2023-05-12 ENCOUNTER — Ambulatory Visit (INDEPENDENT_AMBULATORY_CARE_PROVIDER_SITE_OTHER): Payer: Medicare HMO

## 2023-05-12 DIAGNOSIS — I63412 Cerebral infarction due to embolism of left middle cerebral artery: Secondary | ICD-10-CM

## 2023-05-13 LAB — CUP PACEART REMOTE DEVICE CHECK
Date Time Interrogation Session: 20240605230802
Implantable Pulse Generator Implant Date: 20200921

## 2023-05-19 ENCOUNTER — Other Ambulatory Visit: Payer: Self-pay | Admitting: Neurology

## 2023-06-07 NOTE — Progress Notes (Signed)
Carelink Summary Report / Loop Recorder 

## 2023-06-13 DIAGNOSIS — Z952 Presence of prosthetic heart valve: Secondary | ICD-10-CM | POA: Insufficient documentation

## 2023-06-14 ENCOUNTER — Ambulatory Visit (INDEPENDENT_AMBULATORY_CARE_PROVIDER_SITE_OTHER): Payer: Medicare HMO

## 2023-06-14 DIAGNOSIS — I63412 Cerebral infarction due to embolism of left middle cerebral artery: Secondary | ICD-10-CM | POA: Diagnosis not present

## 2023-06-14 DIAGNOSIS — Z955 Presence of coronary angioplasty implant and graft: Secondary | ICD-10-CM | POA: Insufficient documentation

## 2023-06-15 DIAGNOSIS — I69351 Hemiplegia and hemiparesis following cerebral infarction affecting right dominant side: Secondary | ICD-10-CM | POA: Insufficient documentation

## 2023-06-15 LAB — CUP PACEART REMOTE DEVICE CHECK
Date Time Interrogation Session: 20240708230449
Implantable Pulse Generator Implant Date: 20200921

## 2023-06-18 DIAGNOSIS — R29898 Other symptoms and signs involving the musculoskeletal system: Secondary | ICD-10-CM | POA: Insufficient documentation

## 2023-06-23 ENCOUNTER — Encounter (HOSPITAL_COMMUNITY): Payer: Self-pay

## 2023-06-23 ENCOUNTER — Emergency Department (HOSPITAL_COMMUNITY): Payer: Medicare HMO

## 2023-06-23 ENCOUNTER — Inpatient Hospital Stay (HOSPITAL_COMMUNITY)
Admission: EM | Admit: 2023-06-23 | Discharge: 2023-07-01 | DRG: 065 | Disposition: A | Discharge status: Rehabilitation Facility | Payer: Medicare HMO | Attending: Internal Medicine | Admitting: Internal Medicine

## 2023-06-23 ENCOUNTER — Other Ambulatory Visit: Payer: Self-pay

## 2023-06-23 ENCOUNTER — Observation Stay (HOSPITAL_COMMUNITY): Payer: Medicare HMO

## 2023-06-23 DIAGNOSIS — Z1152 Encounter for screening for COVID-19: Secondary | ICD-10-CM

## 2023-06-23 DIAGNOSIS — K760 Fatty (change of) liver, not elsewhere classified: Secondary | ICD-10-CM | POA: Diagnosis present

## 2023-06-23 DIAGNOSIS — Z7409 Other reduced mobility: Secondary | ICD-10-CM | POA: Diagnosis present

## 2023-06-23 DIAGNOSIS — I639 Cerebral infarction, unspecified: Secondary | ICD-10-CM | POA: Diagnosis present

## 2023-06-23 DIAGNOSIS — R471 Dysarthria and anarthria: Secondary | ICD-10-CM | POA: Diagnosis present

## 2023-06-23 DIAGNOSIS — F0153 Vascular dementia, unspecified severity, with mood disturbance: Secondary | ICD-10-CM | POA: Diagnosis present

## 2023-06-23 DIAGNOSIS — R413 Other amnesia: Secondary | ICD-10-CM | POA: Diagnosis present

## 2023-06-23 DIAGNOSIS — G8191 Hemiplegia, unspecified affecting right dominant side: Secondary | ICD-10-CM | POA: Diagnosis present

## 2023-06-23 DIAGNOSIS — Z823 Family history of stroke: Secondary | ICD-10-CM

## 2023-06-23 DIAGNOSIS — I252 Old myocardial infarction: Secondary | ICD-10-CM

## 2023-06-23 DIAGNOSIS — Z833 Family history of diabetes mellitus: Secondary | ICD-10-CM

## 2023-06-23 DIAGNOSIS — Z8673 Personal history of transient ischemic attack (TIA), and cerebral infarction without residual deficits: Secondary | ICD-10-CM

## 2023-06-23 DIAGNOSIS — K219 Gastro-esophageal reflux disease without esophagitis: Secondary | ICD-10-CM | POA: Diagnosis present

## 2023-06-23 DIAGNOSIS — R4701 Aphasia: Secondary | ICD-10-CM | POA: Diagnosis not present

## 2023-06-23 DIAGNOSIS — Z953 Presence of xenogenic heart valve: Secondary | ICD-10-CM

## 2023-06-23 DIAGNOSIS — R29810 Facial weakness: Secondary | ICD-10-CM | POA: Diagnosis present

## 2023-06-23 DIAGNOSIS — F32A Depression, unspecified: Secondary | ICD-10-CM | POA: Diagnosis present

## 2023-06-23 DIAGNOSIS — H9190 Unspecified hearing loss, unspecified ear: Secondary | ICD-10-CM | POA: Diagnosis present

## 2023-06-23 DIAGNOSIS — Z87891 Personal history of nicotine dependence: Secondary | ICD-10-CM

## 2023-06-23 DIAGNOSIS — Z7902 Long term (current) use of antithrombotics/antiplatelets: Secondary | ICD-10-CM

## 2023-06-23 DIAGNOSIS — F03A3 Unspecified dementia, mild, with mood disturbance: Secondary | ICD-10-CM | POA: Diagnosis present

## 2023-06-23 DIAGNOSIS — E7800 Pure hypercholesterolemia, unspecified: Secondary | ICD-10-CM | POA: Diagnosis not present

## 2023-06-23 DIAGNOSIS — I10 Essential (primary) hypertension: Secondary | ICD-10-CM | POA: Diagnosis not present

## 2023-06-23 DIAGNOSIS — Z955 Presence of coronary angioplasty implant and graft: Secondary | ICD-10-CM

## 2023-06-23 DIAGNOSIS — I2510 Atherosclerotic heart disease of native coronary artery without angina pectoris: Secondary | ICD-10-CM | POA: Diagnosis not present

## 2023-06-23 DIAGNOSIS — N400 Enlarged prostate without lower urinary tract symptoms: Secondary | ICD-10-CM | POA: Diagnosis present

## 2023-06-23 DIAGNOSIS — I6381 Other cerebral infarction due to occlusion or stenosis of small artery: Principal | ICD-10-CM | POA: Diagnosis present

## 2023-06-23 DIAGNOSIS — Z85820 Personal history of malignant melanoma of skin: Secondary | ICD-10-CM

## 2023-06-23 DIAGNOSIS — Z79899 Other long term (current) drug therapy: Secondary | ICD-10-CM

## 2023-06-23 DIAGNOSIS — E1140 Type 2 diabetes mellitus with diabetic neuropathy, unspecified: Secondary | ICD-10-CM | POA: Diagnosis present

## 2023-06-23 DIAGNOSIS — I350 Nonrheumatic aortic (valve) stenosis: Secondary | ICD-10-CM

## 2023-06-23 DIAGNOSIS — E876 Hypokalemia: Secondary | ICD-10-CM | POA: Diagnosis present

## 2023-06-23 DIAGNOSIS — R29706 NIHSS score 6: Secondary | ICD-10-CM | POA: Diagnosis present

## 2023-06-23 DIAGNOSIS — Z952 Presence of prosthetic heart valve: Secondary | ICD-10-CM

## 2023-06-23 LAB — COMPREHENSIVE METABOLIC PANEL
ALT: 26 U/L (ref 0–44)
AST: 30 U/L (ref 15–41)
Albumin: 3.5 g/dL (ref 3.5–5.0)
Alkaline Phosphatase: 89 U/L (ref 38–126)
Anion gap: 13 (ref 5–15)
BUN: 16 mg/dL (ref 8–23)
CO2: 24 mmol/L (ref 22–32)
Calcium: 8.8 mg/dL — ABNORMAL LOW (ref 8.9–10.3)
Chloride: 105 mmol/L (ref 98–111)
Creatinine, Ser: 1.01 mg/dL (ref 0.61–1.24)
GFR, Estimated: >60 mL/min (ref >60)
Glucose, Bld: 136 mg/dL — ABNORMAL HIGH (ref 70–99)
Potassium: 3.4 mmol/L — ABNORMAL LOW (ref 3.5–5.1)
Sodium: 142 mmol/L (ref 135–145)
Total Bilirubin: 0.6 mg/dL (ref 0.3–1.2)
Total Protein: 6 g/dL — ABNORMAL LOW (ref 6.5–8.1)

## 2023-06-23 LAB — PROTIME-INR
INR: 1.2 (ref 0.8–1.2)
Prothrombin Time: 15.1 seconds (ref 11.4–15.2)

## 2023-06-23 LAB — CBC
HCT: 39.7 % (ref 39.0–52.0)
Hemoglobin: 13.1 g/dL (ref 13.0–17.0)
MCH: 31 pg (ref 26.0–34.0)
MCHC: 33 g/dL (ref 30.0–36.0)
MCV: 94.1 fL (ref 80.0–100.0)
Platelets: 210 10*3/uL (ref 150–400)
RBC: 4.22 MIL/uL (ref 4.22–5.81)
RDW: 13.7 % (ref 11.5–15.5)
WBC: 6.9 10*3/uL (ref 4.0–10.5)
nRBC: 0 % (ref 0.0–0.2)

## 2023-06-23 LAB — DIFFERENTIAL
Abs Immature Granulocytes: 0.01 10*3/uL (ref 0.00–0.07)
Basophils Absolute: 0.1 10*3/uL (ref 0.0–0.1)
Basophils Relative: 1 %
Eosinophils Absolute: 0.2 10*3/uL (ref 0.0–0.5)
Eosinophils Relative: 3 %
Immature Granulocytes: 0 %
Lymphocytes Relative: 35 %
Lymphs Abs: 2.4 10*3/uL (ref 0.7–4.0)
Monocytes Absolute: 0.7 10*3/uL (ref 0.1–1.0)
Monocytes Relative: 10 %
Neutro Abs: 3.6 10*3/uL (ref 1.7–7.7)
Neutrophils Relative %: 51 %

## 2023-06-23 LAB — I-STAT CHEM 8, ED
BUN: 17 mg/dL (ref 8–23)
Calcium, Ion: 1.09 mmol/L — ABNORMAL LOW (ref 1.15–1.40)
Chloride: 106 mmol/L (ref 98–111)
Creatinine, Ser: 1 mg/dL (ref 0.61–1.24)
Glucose, Bld: 132 mg/dL — ABNORMAL HIGH (ref 70–99)
HCT: 37 % — ABNORMAL LOW (ref 39.0–52.0)
Hemoglobin: 12.6 g/dL — ABNORMAL LOW (ref 13.0–17.0)
Potassium: 3.5 mmol/L (ref 3.5–5.1)
Sodium: 143 mmol/L (ref 135–145)
TCO2: 25 mmol/L (ref 22–32)

## 2023-06-23 LAB — CBG MONITORING, ED: Glucose-Capillary: 127 mg/dL — ABNORMAL HIGH (ref 70–99)

## 2023-06-23 LAB — APTT: aPTT: 30 seconds (ref 24–36)

## 2023-06-23 LAB — ETHANOL: Alcohol, Ethyl (B): <10 mg/dL (ref <10)

## 2023-06-23 MED ORDER — SODIUM CHLORIDE 0.9% FLUSH
3.0000 mL | Freq: Once | INTRAVENOUS | Status: AC
  Administered 2023-06-23: 3 mL via INTRAVENOUS

## 2023-06-23 MED ORDER — ASPIRIN 300 MG RE SUPP: 300.0000 mg | Freq: Every day | RECTAL | Status: DC

## 2023-06-23 MED ORDER — IOHEXOL 350 MG/ML SOLN
75.0000 mL | Freq: Once | INTRAVENOUS | Status: AC | PRN
  Administered 2023-06-23: 75 mL via INTRAVENOUS

## 2023-06-23 MED ORDER — ACETAMINOPHEN 650 MG RE SUPP: 650.0000 mg | RECTAL | Status: DC | PRN

## 2023-06-23 MED ORDER — MEMANTINE HCL 10 MG PO TABS
10.0000 mg | ORAL_TABLET | Freq: Two times a day (BID) | ORAL | Status: DC
  Administered 2023-06-24 – 2023-07-01 (×16): 10 mg via ORAL
  Filled 2023-06-23 (×16): qty 1

## 2023-06-23 MED ORDER — CLOPIDOGREL BISULFATE 75 MG PO TABS
75.0000 mg | ORAL_TABLET | Freq: Every day | ORAL | Status: DC
  Administered 2023-06-23 – 2023-06-24 (×2): 75 mg via ORAL
  Filled 2023-06-23 (×2): qty 1

## 2023-06-23 MED ORDER — STROKE: EARLY STAGES OF RECOVERY BOOK
Freq: Once | Status: AC
  Filled 2023-06-23: qty 1

## 2023-06-23 MED ORDER — ACETAMINOPHEN 325 MG PO TABS
650.0000 mg | ORAL_TABLET | ORAL | Status: DC | PRN
  Administered 2023-06-24: 650 mg via ORAL
  Filled 2023-06-23: qty 2

## 2023-06-23 MED ORDER — ASPIRIN 325 MG PO TABS
325.0000 mg | ORAL_TABLET | Freq: Every day | ORAL | Status: DC
  Administered 2023-06-23 – 2023-06-24 (×2): 325 mg via ORAL
  Filled 2023-06-23 (×2): qty 1

## 2023-06-23 MED ORDER — ATORVASTATIN CALCIUM 40 MG PO TABS: 40.0000 mg | ORAL_TABLET | Freq: Every day | ORAL | Status: DC

## 2023-06-23 MED ORDER — ACETAMINOPHEN 160 MG/5ML PO SOLN: 650.0000 mg | ORAL | Status: DC | PRN

## 2023-06-23 MED ORDER — SENNOSIDES-DOCUSATE SODIUM 8.6-50 MG PO TABS
1.0000 | ORAL_TABLET | Freq: Every evening | ORAL | Status: DC | PRN
  Administered 2023-06-25 (×2): 1 via ORAL
  Filled 2023-06-23 (×2): qty 1

## 2023-06-23 NOTE — Consult Note (Signed)
Neurology Consultation  Reason for Consult: Code Stroke Referring Physician: Anitra Lauth  CC:  History is obtained from: spouse/SO, EMS personnel, and past medical records  HPI: Michael Kidd is a 89 y.o. male with a past medical history of stroke in January on Plavix(work up at [facility]), previous stroke in 202, BPH, CAD, neuropathy, MI, HLD, HTN, and aortic valve stenosis s/p TAVR in 2021  presenting with facial droop and slow responsiveness. Per EMS report patient took a nap in the living room with his wife and then woke up at 1530 and became confused. Upon talking with his wife it is found that he was difficult for her to wake him up this morning and he had been slow to respond this morning as well. He then had physical therapy, but she did not watch and ended up falling back asleep. She does not know what time she woke back up but she went to the living room where he was and they were watching TV and napping. He woke up around 1530 and could not distinguish between the TV and chair remote. He fell back asleep and then was very difficult to wake back up. He had garbled speech and his facial droop looked worse. She then called EMS.    LKW: Unclear- 7/16 TNK given?: No  Premorbid modified Rankin scale (mRS): 4 4-Needs assistance to walk and tend to bodily needs   ROS: Full ROS was performed and is negative except as noted in the HPI.   Past Medical History:  Diagnosis Date   Allergic rhinitis 03/29/2014   Aortic valve stenosis 05/23/2020   Arthralgia 07/15/2016   Arthritis    Benign prostatic hyperplasia 02/14/2014   Benign prostatic hyperplasia with urinary frequency 11/02/2018   Bilateral chronic knee pain    BPH (benign prostatic hyperplasia)    CAD (coronary artery disease)    s/p remote LAD PCI   Chronic ischemic heart disease 08/13/2010   Chronic pain of both knees 09/01/2017   Coronary artery disease with PCI and stenting many years ago 10/19/2019   Depression 02/14/2014    Formatting of this note might be different from the original. PHQ-9 completed 02/26/14 PHQ-2 completed 02/14/14   Dizziness 03/12/2014   Dupuytren's contracture 07/15/2016   Dyspnea on exertion 08/15/2020   Dysthymia    Essential hypertension 03/12/2014   Flexor tendon bowstring 01/06/2016   Gastroesophageal reflux disease 08/18/2016   Hepatic steatosis 03/02/2014   History of CVA (cerebrovascular accident) 10/12/2019   HLD (hyperlipidemia) 02/14/2014   HTN (hypertension)    Late effect of cerebrovascular accident (CVA) 10/19/2019   Melanoma in situ of right upper extremity including shoulder (HCC) 08/07/2016   Formatting of this note might be different from the original. August 2017.  Dr. Chancy Milroy, [facility].   Murmur 05/23/2020   Myocardial infarction [facility])    Neuropathic pain, leg, bilateral 11/23/2017   Nonsustained ventricular tachycardia (HCC) 11/12/2020   Pure hypercholesterolemia 09/14/2018   Quadriceps weakness 09/01/2017   Renal cyst    Risk for falls 10/01/2016   S/P TAVR (transcatheter aortic valve replacement) 09/24/2020   Short of breath on exertion    per patient   Stroke [facility])    November   Symptomatic severe aortic stenosis with normal ejection fraction 08/15/2020    Family History  Problem Relation Age of Onset   Diabetes Mother    Stroke Father     Social History:   reports that he has quit smoking. He has never used  smokeless tobacco. He reports that he does not currently use alcohol. He reports that he does not use drugs.  Medications  Current Facility-Administered Medications:    sodium chloride flush (NS) 0.9 % injection 3 mL, 3 mL, Intravenous, Once, Gwyneth Sprout, MD  Current Outpatient Medications:    acetaminophen (TYLENOL) 650 MG CR tablet, Take 325 mg by mouth as needed for pain., Disp: , Rfl:    atorvastatin (LIPITOR) 40 MG tablet, Take 1 tablet (40 mg total) by mouth daily at 6 PM., Disp: 30 tablet, Rfl: 11   calcium carbonate (TUMS - DOSED  IN MG ELEMENTAL CALCIUM) 500 MG chewable tablet, Chew 1-2 tablets by mouth 3 (three) times daily as needed for indigestion or heartburn. (Patient not taking: Reported on 03/23/2023), Disp: , Rfl:    clopidogrel (PLAVIX) 75 MG tablet, Take 1 tablet by mouth once daily (Patient taking differently: Take 75 mg by mouth daily.), Disp: 90 tablet, Rfl: 0   DULoxetine (CYMBALTA) 20 MG capsule, Take 20 mg by mouth daily. (Patient not taking: Reported on 03/23/2023), Disp: , Rfl:    finasteride (PROSCAR) 5 MG tablet, Take 5 mg by mouth every evening.  (Patient not taking: Reported on 03/23/2023), Disp: , Rfl:    fluticasone (FLONASE) 50 MCG/ACT nasal spray, Place 2 sprays into both nostrils daily as needed for allergies or rhinitis. (Patient not taking: Reported on 03/23/2023), Disp: , Rfl:    lisinopril (ZESTRIL) 2.5 MG tablet, Take 1 tablet by mouth once daily, Disp: 30 tablet, Rfl: 0   memantine (NAMENDA TITRATION PAK) tablet pack, 5 mg/day for =1 week; 5 mg twice daily for =1 week; 15 mg/day given in 5 mg and 10 mg separated doses for =1 week; then 10 mg twice daily, Disp: 49 tablet, Rfl: 12   memantine (NAMENDA) 10 MG tablet, Take 1 tablet (10 mg total) by mouth 2 (two) times daily., Disp: 60 tablet, Rfl: 3   mirtazapine (REMERON) 7.5 MG tablet, Take 7.5 mg by mouth at bedtime. (Patient not taking: Reported on 03/23/2023), Disp: , Rfl:    Naphazoline-Glycerin (CLEAR EYES MAX REDNESS RELIEF) 0.03-0.5 % SOLN, Place 1-2 drops into both eyes 3 (three) times daily as needed (redness relief). (Patient not taking: Reported on 03/23/2023), Disp: , Rfl:    venlafaxine XR (EFFEXOR-XR) 37.5 MG 24 hr capsule, Take 37.5 mg by mouth daily. (Patient not taking: Reported on 03/23/2023), Disp: , Rfl:   Exam: Current vital signs: There were no vitals taken for this visit. Vital signs in last 24 hours: BP: ()/()  Arterial Line BP: ()/()   GENERAL: Awake, alert in NAD HEENT: - Normocephalic and atraumatic, dry mm, no LN++, no  Thyromegally LUNGS - Clear to auscultation bilaterally with no wheezes CV - S1S2 RRR, no m/r/g, equal pulses bilaterally. ABDOMEN - Soft, nontender, nondistended with normoactive BS Ext: warm, well perfused, intact peripheral pulses, no edema  NEURO:  Mental Status: Alert and oriented to self Language: Speech mildly dysarthric, mild facial droop  Cranial Nerves: PERRL. EOMI, visual fields full, right facial droop, facial sensation intact, hearing intact, tongue/uvula/soft palate midline, normal sternocleidomastoid and trapezius muscle strength. No evidence of tongue atrophy or fasciculations Motor:  RUE 4/5 LUE 5/5 RLE 4/5 LLE 5/5 Tone: is normal and bulk is normal Sensation- Intact to light touch bilaterally Coordination: FTN intact bilaterally, no ataxia in BLE. Gait- deferred  NIHSS 1a Level of Conscious.: 0 1b LOC Questions: 1 1c LOC Commands: 0 2 Best Gaze: 0 3 Visual: 0 4 Facial  Palsy: 0 5a Motor Arm - left: 0 5b Motor Arm - Right: 1 6a Motor Leg - Left: 0 6b Motor Leg - Right: 1 7 Limb Ataxia: 0 8 Sensory: 1 9 Best Language: 1 10 Dysarthria: 1 11 Extinct. and Inatten.: 0 TOTAL: 6   Labs I have reviewed labs in epic and the results pertinent to this consultation are:  CBC    Component Value Date/Time   WBC 10.7 (H) 09/25/2020 0208   RBC 4.12 (L) 09/25/2020 0208   HGB 12.8 (L) 09/25/2020 0208   HGB 14.4 08/15/2020 1421   HCT 37.8 (L) 09/25/2020 0208   HCT 42.0 08/15/2020 1421   PLT 167 09/25/2020 0208   PLT 224 08/15/2020 1421   MCV 91.7 09/25/2020 0208   MCV 89 08/15/2020 1421   MCH 31.1 09/25/2020 0208   MCHC 33.9 09/25/2020 0208   RDW 13.6 09/25/2020 0208   RDW 14.4 08/15/2020 1421   LYMPHSABS 2.3 08/28/2019 0440   MONOABS 0.6 08/28/2019 0440   EOSABS 0.1 08/28/2019 0440   BASOSABS 0.0 08/28/2019 0440    CMP     Component Value Date/Time   NA 143 05/09/2021 1533   K 4.2 05/09/2021 1533   CL 106 05/09/2021 1533   CO2 23 05/09/2021 1533    GLUCOSE 109 (H) 05/09/2021 1533   GLUCOSE 125 (H) 09/25/2020 0208   BUN 16 05/09/2021 1533   CREATININE 1.02 05/09/2021 1533   CALCIUM 9.2 05/09/2021 1533   PROT 6.5 09/20/2020 1200   ALBUMIN 3.8 09/20/2020 1200   AST 27 09/20/2020 1200   ALT 21 09/20/2020 1200   ALKPHOS 73 09/20/2020 1200   BILITOT 1.2 09/20/2020 1200   GFRNONAA 55 (L) 11/26/2020 1409   GFRNONAA >60 09/25/2020 0208   GFRAA 64 11/26/2020 1409    Lipid Panel     Component Value Date/Time   CHOL 124 08/27/2019 0532   TRIG 113 08/27/2019 0532   HDL 30 (L) 08/27/2019 0532   CHOLHDL 4.1 08/27/2019 0532   VLDL 23 08/27/2019 0532   LDLCALC 71 08/27/2019 0532     Imaging I have reviewed the images obtained:  CT-head - New asymmetric hypoattenuation in the posterior limb of the left internal capsule, suspicious for acute infarct. No hemorrhage or mass effect. ASPECT score is 9.  CT Angio Head and Neck - No large vessel occlusion, hemodynamically significant stenosis, or aneurysm in the head or neck.  Assessment:  89 y.o. male  past medical history of stroke in January on Plavix(work up at [facility]), BPH, CAD, neuropathy, MI, HLD, HTN, and aortic valve stenosis s/p TAVR in 2021  presenting with facial droop and slow responsiveness.   Impression: Left internal capsule infarct   Recommendations: - HgbA1c, fasting lipid panel - The following imaging if indicated  - MRI of the brain without contrast - Echocardiogram - Prophylactic therapy-Antiplatelets (to start 24 hours post TNK)  ASA 81mg  and Plavix 75mg  daily  - Risk factor modification - Loop recorder reading  - Telemetry monitoring - PT consult, OT consult, Speech consult - Stroke team to follow     Patient seen and examined by NP/APP with MD. MD to update note as needed.   Elmer Picker, DNP, FNP-BC [facility] Pager: [phone]   NEUROHOSPITALIST ADDENDUM Performed a face to face diagnostic evaluation.   I have  reviewed the contents of history and physical exam as documented by PA/ARNP/Resident and agree with above documentation.  I have discussed and formulated the above  plan as documented. Edits to the note have been made as needed.  Impression/Key exam findings/Plan: Neuro exam with R sided sensory deficit, mild aphasia only notable when conversing with him. Both patient and wife unable to provide a reliable LKW. Infact wife reports that she checks in on him every morning and he was difficult to wake up this AM. He did not go to his PT appointment per patient. The provided LKW by EMS was when patient actually woke up and not the actual LKW. With the inability to identify LKW, he is not a candidate for tnkase. No LVO and thus not a candidate for thrombectomy. High suspicion for stroke and stroke workup as above.  Will also recommend interrogating his Loop recorder.  Erick Blinks, MD [facility] [phone]   If 7pm to 7am, please call on call as listed on AMION.

## 2023-06-23 NOTE — ED Notes (Signed)
 Assisted pt with urinal

## 2023-06-23 NOTE — ED Notes (Signed)
ED TO INPATIENT HANDOFF REPORT  ED Nurse Name and Phone #: Morrie Sheldon [phone]  F Name/Age/Gender Michael Kidd 89 y.o. male Room/Bed: 038C/038C  Code Status   Code Status: Full Code  Home/SNF/Other Home Patient oriented to: self, place, time, and situation Is this baseline? Yes   Triage Complete: Triage complete  Chief Complaint Acute CVA (cerebrovascular accident) [facility]) [I63.9]  Triage Note Pt present to ED from home d/t altered mental status. Wife states last well known time 1530. Wife states pt was awake then went back to sleep. Wife states when pt awoke, pt had difficulty using the remote. Pt denies pain/discomfort at this time. Pt alert to name, able to follow directions at this time.    Allergies No Known Allergies  Level of Care/Admitting Diagnosis ED Disposition     ED Disposition  Admit   Condition  --   Comment  Hospital Area: [facility] [[id number]]  Level of Care: Telemetry Medical [104]  May place patient in observation at [facility] or [facility] if equivalent level of care is available:: No  Covid Evaluation: Asymptomatic - no recent exposure (last 10 days) testing not required  Diagnosis: Acute CVA (cerebrovascular accident) [facility]) [[id number]]  Admitting Physician: Synetta Fail [[id number]]  Attending Physician: Synetta Fail [[id number]]          B Medical/Surgery History Past Medical History:  Diagnosis Date   Allergic rhinitis 03/29/2014   Aortic valve stenosis 05/23/2020   Arthralgia 07/15/2016   Arthritis    Benign prostatic hyperplasia 02/14/2014   Benign prostatic hyperplasia with urinary frequency 11/02/2018   Bilateral chronic knee pain    BPH (benign prostatic hyperplasia)    CAD (coronary artery disease)    s/p remote LAD PCI   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 PHQ-9 completed 02/26/14 PHQ-2 completed 02/14/14   Dizziness 03/12/2014   Dupuytren's contracture 07/15/2016   Dyspnea on exertion 08/15/2020   Dysthymia    Essential hypertension 03/12/2014   Flexor tendon bowstring 01/06/2016   Gastroesophageal reflux disease 08/18/2016   Hepatic steatosis 03/02/2014   History of CVA (cerebrovascular accident) 10/12/2019   HLD (hyperlipidemia) 02/14/2014   HTN (hypertension)    Late effect of cerebrovascular accident (CVA) 10/19/2019   Melanoma in situ of right upper extremity including shoulder (HCC) 08/07/2016   Formatting of this note might be different from the original. August 2017.  Dr. Chancy Milroy, [facility].   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   Short of breath on exertion    per patient   Stroke [facility])    November   Symptomatic severe aortic stenosis with normal ejection fraction 08/15/2020   Past Surgical History:  Procedure Laterality Date   CATARACT EXTRACTION W/ INTRAOCULAR LENS  IMPLANT, BILATERAL     cataract repair     HERNIA REPAIR     unsure of date   LOOP RECORDER INSERTION N/A 08/28/2019   Procedure: LOOP RECORDER INSERTION;  Surgeon: Marinus Maw, MD;  Location: [facility] INVASIVE  CV LAB;  Service: Cardiovascular;  Laterality: N/A;   PERCUTANEOUS CORONARY STENT INTERVENTION (PCI-S)     RIGHT/LEFT HEART CATH AND CORONARY ANGIOGRAPHY N/A 08/22/2020   Procedure: RIGHT/LEFT HEART CATH AND CORONARY ANGIOGRAPHY;  Surgeon: Marykay Lex, MD;  Location: [facility] INVASIVE CV LAB;  Service: Cardiovascular;  Laterality: N/A;   TEE WITHOUT CARDIOVERSION N/A 09/24/2020   Procedure: TRANSESOPHAGEAL ECHOCARDIOGRAM  (TEE);  Surgeon: Tonny Bollman, MD;  Location: [facility] OR;  Service: Open Heart Surgery;  Laterality: N/A;   TRANSCATHETER AORTIC VALVE REPLACEMENT, TRANSFEMORAL N/A 09/24/2020   Procedure: TRANSCATHETER AORTIC VALVE REPLACEMENT, TRANSFEMORAL - USING EDWARDS SAPIEN 3 ULTRA  VALVE SIZE ;  Surgeon: Tonny Bollman, MD;  Location: [facility] OR;  Service: Open Heart Surgery;  Laterality: N/A;     A IV Location/Drains/Wounds Patient Lines/Drains/Airways Status     Active Line/Drains/Airways     Name Placement date Placement time Site Days   Peripheral IV 06/23/23 20 G 1" Left Antecubital 06/23/23  1800  Antecubital  less than 1   Incision (Closed) 09/24/20 Groin Right 09/24/20  0844  -- 1002   Incision (Closed) 09/24/20 Groin Left 09/24/20  0844  -- 1002            Intake/Output Last 24 hours No intake or output data in the 24 hours ending 06/23/23 2027  Labs/Imaging Results for orders placed or performed during the hospital encounter of 06/23/23 (from the past 48 hour(s))  CBG monitoring, ED     Status: Abnormal   Collection Time: 06/23/23  4:42 PM  Result Value Ref Range   Glucose-Capillary 127 (H) 70 - 99 mg/dL    Comment: Glucose reference range applies only to samples taken after fasting for at least 8 hours.  I-stat chem 8, ED     Status: Abnormal   Collection Time: 06/23/23  4:46 PM  Result Value Ref Range   Sodium 143 135 - 145 mmol/L   Potassium 3.5 3.5 - 5.1 mmol/L   Chloride 106 98 - 111 mmol/L   BUN 17 8 - 23 mg/dL   Creatinine, Ser 2.13 0.61 - 1.24 mg/dL   Glucose, Bld 086 (H) 70 - 99 mg/dL    Comment: Glucose reference range applies only to samples taken after fasting for at least 8 hours.   Calcium, Ion 1.09 (L) 1.15 - 1.40 mmol/L   TCO2 25 22 - 32 mmol/L   Hemoglobin 12.6 (L) 13.0 - 17.0 g/dL   HCT 57.8 (L) 46.9 - 62.9 %  Protime-INR     Status: None   Collection Time: 06/23/23  4:48 PM  Result Value Ref Range   Prothrombin Time 15.1 11.4 - 15.2 seconds   INR  1.2 0.8 - 1.2    Comment: (NOTE) INR goal varies based on device and disease states. Performed at [facility], [address] [place], [place] [address]   APTT     Status: None   Collection Time: 06/23/23  4:48 PM  Result Value Ref Range   aPTT 30 24 - 36 seconds    Comment: Performed at [facility], [address], [place], [place] [address]  CBC     Status: None   Collection Time: 06/23/23  4:48 PM  Result Value Ref Range   WBC 6.9 4.0 - 10.5 K/uL   RBC 4.22 4.22 - 5.81 MIL/uL   Hemoglobin 13.1 13.0 - 17.0 g/dL   HCT 10.2 72.5 - 36.6 %   MCV 94.1 80.0 - 100.0  fL   MCH 31.0 26.0 - 34.0 pg   MCHC 33.0 30.0 - 36.0 g/dL   RDW 16.1 09.6 - 04.5 %   Platelets 210 150 - 400 K/uL   nRBC 0.0 0.0 - 0.2 %    Comment: Performed at [facility], [address] [place], [place] [address]  Differential     Status: None   Collection Time: 06/23/23  4:48 PM  Result Value Ref Range   Neutrophils Relative % 51 %   Neutro Abs 3.6 1.7 - 7.7 K/uL   Lymphocytes Relative 35 %   Lymphs Abs 2.4 0.7 - 4.0 K/uL   Monocytes Relative 10 %   Monocytes Absolute 0.7 0.1 - 1.0 K/uL   Eosinophils Relative 3 %   Eosinophils Absolute 0.2 0.0 - 0.5 K/uL   Basophils Relative 1 %   Basophils Absolute 0.1 0.0 - 0.1 K/uL   Immature Granulocytes 0 %   Abs Immature Granulocytes 0.01 0.00 - 0.07 K/uL    Comment: Performed at [facility], [address], [place], [place] [address]  Comprehensive metabolic panel     Status: Abnormal   Collection Time: 06/23/23  4:48 PM  Result Value Ref Range   Sodium 142 135 - 145 mmol/L   Potassium 3.4 (L) 3.5 - 5.1 mmol/L   Chloride 105 98 - 111 mmol/L   CO2 24 22 - 32 mmol/L   Glucose, Bld 136 (H) 70 - 99 mg/dL    Comment: Glucose reference range applies only to samples taken after fasting for at least 8 hours.   BUN 16 8 - 23 mg/dL   Creatinine, Ser 8.29 0.61 - 1.24 mg/dL   Calcium 8.8 (L) 8.9 - 10.3 mg/dL   Total Protein 6.0 (L) 6.5 - 8.1  g/dL   Albumin 3.5 3.5 - 5.0 g/dL   AST 30 15 - 41 U/L   ALT 26 0 - 44 U/L   Alkaline Phosphatase 89 38 - 126 U/L   Total Bilirubin 0.6 0.3 - 1.2 mg/dL   GFR, Estimated >56 >21 mL/min    Comment: (NOTE) Calculated using the CKD-EPI Creatinine Equation (2021)    Anion gap 13 5 - 15    Comment: Performed at [facility], [address] [place], [place] [address]  Ethanol     Status: None   Collection Time: 06/23/23  4:48 PM  Result Value Ref Range   Alcohol, Ethyl (B) <10 <10 mg/dL    Comment: (NOTE) Lowest detectable limit for serum alcohol is 10 mg/dL.  For medical purposes only. Performed at [facility], [address] [place], [place] [address]    CT ANGIO HEAD NECK W WO CM (CODE STROKE)  Result Date: 06/23/2023 CLINICAL DATA:  Neuro deficit, acute, stroke suspected. Right-sided deficit. EXAM: CT ANGIOGRAPHY HEAD AND NECK WITH AND WITHOUT CONTRAST TECHNIQUE: Multidetector CT imaging of the head and neck was performed using the standard protocol during bolus administration of intravenous contrast. Multiplanar CT image reconstructions and MIPs were obtained to evaluate the vascular anatomy. Carotid stenosis measurements (when applicable) are obtained utilizing NASCET criteria, using the distal internal carotid diameter as the denominator. RADIATION DOSE REDUCTION: This exam was performed according to the departmental dose-optimization program which includes automated exposure control, adjustment of the mA and/or kV according to patient size and/or use of iterative reconstruction technique. CONTRAST:  75mL OMNIPAQUE IOHEXOL 350 MG/ML SOLN COMPARISON:  Head CT 06/23/2023. CTA head 01/04/2023. MRI brain 08/26/2019.  FINDINGS: CTA NECK FINDINGS Aortic arch: Four vessel arch configuration with separate origin of the left vertebral artery. Atherosclerotic calcifications of the aortic arch and arch vessel origins. Arch vessel origins are patent. Right carotid system: No evidence of  dissection, stenosis (50% or greater), or occlusion. Calcified plaque of the right carotid bulb. Tortuous distal cervical ICA. Left carotid system: No evidence of dissection, stenosis (50% or greater), or occlusion. Mixed plaque of the left carotid bulb. Mildly tortuous distal cervical ICA. Vertebral arteries: Codominant. No evidence of dissection, stenosis (50% or greater), or occlusion. Skeleton: Mild cervical spondylosis without high-grade spinal canal stenosis. Other neck: Unremarkable. Upper chest: Unremarkable. Review of the MIP images confirms the above findings CTA HEAD FINDINGS Anterior circulation: Calcified plaque along the carotid siphons without hemodynamically significant stenosis. The proximal ACAs and MCAs are patent without stenosis or aneurysm. Distal branches are symmetric. Posterior circulation: Normal basilar artery. The SCAs, AICAs and PICAs are patent proximally. The PCAs are patent proximally without stenosis or aneurysm. Distal branches are symmetric. Venous sinuses: As permitted by contrast timing, patent. Anatomic variants: Persistent fetal origin of the left PCA with hypoplastic left P1 segment. Review of the MIP images confirms the above findings IMPRESSION: No large vessel occlusion, hemodynamically significant stenosis, or aneurysm in the head or neck. Aortic Atherosclerosis (ICD10-I70.0). Electronically Signed   By: Orvan Falconer M.D.   On: 06/23/2023 17:15   CT HEAD CODE STROKE WO CONTRAST  Result Date: 06/23/2023 CLINICAL DATA:  Code stroke. Neuro deficit, acute, stroke suspected. Slurred speech and right-sided weakness. EXAM: CT HEAD WITHOUT CONTRAST TECHNIQUE: Contiguous axial images were obtained from the base of the skull through the vertex without intravenous contrast. RADIATION DOSE REDUCTION: This exam was performed according to the departmental dose-optimization program which includes automated exposure control, adjustment of the mA and/or kV according to patient size  and/or use of iterative reconstruction technique. COMPARISON:  Head CT 01/04/2023. FINDINGS: Brain: New asymmetric hypoattenuation in the posterior limb of the left internal capsule, suspicious for acute infarct. Old infarct in the left thalamus. Background of moderate chronic small-vessel disease. No hydrocephalus or extra-axial collection. No mass effect or midline shift. Vascular: No hyperdense vessel or unexpected calcification. Skull: No calvarial fracture or suspicious bone lesion. Skull base is unremarkable. Sinuses/Orbits: Mild mucosal disease in the maxillary and frontal sinuses. Orbits are unremarkable. Other: None. ASPECTS ([place] Stroke Program Early CT Score) - Ganglionic level infarction (caudate, lentiform nuclei, internal capsule, insula, M1-M3 cortex): 6 - Supraganglionic infarction (M4-M6 cortex): 3 Total score (0-10 with 10 being normal): 9 IMPRESSION: 1. New asymmetric hypoattenuation in the posterior limb of the left internal capsule, suspicious for acute infarct. No hemorrhage or mass effect. 2. ASPECT score is 9. Code stroke imaging results were communicated on 06/23/2023 at 5:09 pm to provider Dr. Derry Lory via secure text paging. Electronically Signed   By: Orvan Falconer M.D.   On: 06/23/2023 17:09    Pending Labs Unresulted Labs (From admission, onward)     Start     Ordered   06/24/23 0500  Lipid panel  (Labs)  Tomorrow morning,   R       Comments: Fasting    06/23/23 1852   06/24/23 0500  Hemoglobin A1c  (Labs)  Tomorrow morning,   R       Comments: To assess prior glycemic control    06/23/23 1852            Vitals/Pain Today's Vitals   06/23/23 1700 06/23/23 1720 06/23/23  1830 06/23/23 1844  BP: (!) 164/83  (!) 193/95   Pulse:   65   Temp:  97.6 F (36.4 C)    SpO2:   97%   Weight:      Height:      PainSc:    0-No pain    Isolation Precautions No active isolations  Medications Medications  atorvastatin (LIPITOR) tablet 40 mg (has no  administration in time range)  memantine (NAMENDA) tablet 10 mg (has no administration in time range)  clopidogrel (PLAVIX) tablet 75 mg (75 mg Oral Given 06/23/23 2027)   stroke: early stages of recovery book (has no administration in time range)  acetaminophen (TYLENOL) tablet 650 mg (has no administration in time range)    Or  acetaminophen (TYLENOL) 160 MG/5ML solution 650 mg (has no administration in time range)    Or  acetaminophen (TYLENOL) suppository 650 mg (has no administration in time range)  senna-docusate (Senokot-S) tablet 1 tablet (has no administration in time range)  aspirin suppository 300 mg ( Rectal See Alternative 06/23/23 2027)    Or  aspirin tablet 325 mg (325 mg Oral Given 06/23/23 2027)  sodium chloride flush (NS) 0.9 % injection 3 mL (3 mLs Intravenous Given 06/23/23 1840)  iohexol (OMNIPAQUE) 350 MG/ML injection 75 mL (75 mLs Intravenous Contrast Given 06/23/23 1700)    Mobility walks with device     Focused Assessments See chart   R Recommendations: See Admitting Provider Note  Report given to:   Additional Notes:  See chart

## 2023-06-23 NOTE — ED Triage Notes (Signed)
Pt present to ED from home d/t altered mental status. Wife states last well known time 1530. Wife states pt was awake then went back to sleep. Wife states when pt awoke, pt had difficulty using the remote. Pt denies pain/discomfort at this time. Pt alert to name, able to follow directions at this time.

## 2023-06-23 NOTE — Code Documentation (Signed)
Stroke Response Nurse Documentation Code Documentation  Michael Kidd is a 89 y.o. male arriving to [facility]  via [facility] on 06/23/23 with past medical hx of hypertension, CAD status post PCI, stroke (in January and on Plavix), ischemic heart disease, aortic stenosis status post TAVR . On No antithrombotic. Code stroke was activated by EMS.   Patient from home where he was LKW at an unclear time as he lives with wife and she was unsure of when he was truly at his baseline. Pt now complaining of slurred speech, facial droop, right sided weakness, aphasia. Pt woke up from his nap around 1530 and could not use the remote control correctly, had a new facial droop and was confused per wife.   Stroke team at the bedside on patient arrival. Labs drawn and patient cleared for CT by Dr. Alinda Money. Patient to CT with team. NIHSS 6, see documentation for details and code stroke times. Patient with disoriented, right facial droop, right leg weakness, right decreased sensation, Expressive aphasia , and dysarthria  on exam. The following imaging was completed:  CT Head and CTA. Patient is not a candidate for IV Thrombolytic due to unclear last known well. Patient is not a candidate for IR due to no LVO per MD.   Care Plan: Q2 neuro checks, permissive HTn <220/120.   Bedside handoff with ED RN.    Modena Slater  Stroke Response RN [phone]

## 2023-06-23 NOTE — H&P (Signed)
History and Physical   RAYSHON ALBAUGH NAT:[id number] DOB: [birth year] DOA: 06/23/2023  PCP: Barron Alvine, MD   Patient coming from: Home  Chief Complaint: Focal neurologic deficit  HPI: Michael Kidd is a 89 y.o. male with medical history significant of hypertension, hyperlipidemia, GERD, stroke, depression, BPH, aortic stenosis status post TAVR, CAD status post stenting presenting with focal neurologic deficits.  Patient has right-sided sensory deficits and aphasia.  Unclear last known normal.  EMS had reported around 330 however wife reports patient has not been completely himself since he was admitted to the hospital in January and is unsure exactly when these changes began.  Noted to have facial droop in the ED as well.  Denies fevers, chills, chest pain, shortness of breath, abdominal pain, constipation, diarrhea, nausea, vomiting.  ED Course: Signs in ED notable for blood pressure in the 190 systolic.  Lab workup included CMP with potassium 3.4, glucose 136, calcium 8.8, protein 6.0.  CBC within normal limits.  PT, PTT, INR within normal limits.  Ethanol level negative.  CT head showed new hypoattenuated area suspicious for infarct.  CTA head and neck showed no large vessel occlusion or significant stenosis nor aneurysm.  Neurology consulted and are following.  Review of Systems: As per HPI otherwise all other systems reviewed and are negative.  Past Medical History:  Diagnosis Date   Allergic rhinitis 03/29/2014   Aortic valve stenosis 05/23/2020   Arthralgia 07/15/2016   Arthritis    Benign prostatic hyperplasia 02/14/2014   Benign prostatic hyperplasia with urinary frequency 11/02/2018   Bilateral chronic knee pain    BPH (benign prostatic hyperplasia)    CAD (coronary artery disease)    s/p remote LAD PCI   Chronic ischemic heart disease 08/13/2010   Chronic pain of both knees 09/01/2017   Coronary artery disease with PCI and stenting many years ago 10/19/2019   Depression  02/14/2014   Formatting of this note might be different from the original. PHQ-9 completed 02/26/14 PHQ-2 completed 02/14/14   Dizziness 03/12/2014   Dupuytren's contracture 07/15/2016   Dyspnea on exertion 08/15/2020   Dysthymia    Essential hypertension 03/12/2014   Flexor tendon bowstring 01/06/2016   Gastroesophageal reflux disease 08/18/2016   Hepatic steatosis 03/02/2014   History of CVA (cerebrovascular accident) 10/12/2019   HLD (hyperlipidemia) 02/14/2014   HTN (hypertension)    Late effect of cerebrovascular accident (CVA) 10/19/2019   Melanoma in situ of right upper extremity including shoulder (HCC) 08/07/2016   Formatting of this note might be different from the original. August 2017.  Dr. Chancy Milroy, [facility].   Murmur 05/23/2020   Myocardial infarction [facility])    Neuropathic pain, leg, bilateral 11/23/2017   Nonsustained ventricular tachycardia (HCC) 11/12/2020   Pure hypercholesterolemia 09/14/2018   Quadriceps weakness 09/01/2017   Renal cyst    Risk for falls 10/01/2016   S/P TAVR (transcatheter aortic valve replacement) 09/24/2020   Short of breath on exertion    per patient   Stroke [facility])    November   Symptomatic severe aortic stenosis with normal ejection fraction 08/15/2020    Past Surgical History:  Procedure Laterality Date   CATARACT EXTRACTION W/ INTRAOCULAR LENS  IMPLANT, BILATERAL     cataract repair     HERNIA REPAIR     unsure of date   LOOP RECORDER INSERTION N/A 08/28/2019   Procedure: LOOP RECORDER INSERTION;  Surgeon: Marinus Maw, MD;  Location: [facility] INVASIVE CV LAB;  Service: Cardiovascular;  Laterality:  N/A;   PERCUTANEOUS CORONARY STENT INTERVENTION (PCI-S)     RIGHT/LEFT HEART CATH AND CORONARY ANGIOGRAPHY N/A 08/22/2020   Procedure: RIGHT/LEFT HEART CATH AND CORONARY ANGIOGRAPHY;  Surgeon: Marykay Lex, MD;  Location: [facility] INVASIVE CV LAB;  Service: Cardiovascular;  Laterality: N/A;   TEE WITHOUT CARDIOVERSION N/A 09/24/2020   Procedure:  TRANSESOPHAGEAL ECHOCARDIOGRAM (TEE);  Surgeon: Tonny Bollman, MD;  Location: [facility] OR;  Service: Open Heart Surgery;  Laterality: N/A;   TRANSCATHETER AORTIC VALVE REPLACEMENT, TRANSFEMORAL N/A 09/24/2020   Procedure: TRANSCATHETER AORTIC VALVE REPLACEMENT, TRANSFEMORAL - USING EDWARDS SAPIEN 3 ULTRA  VALVE SIZE ;  Surgeon: Tonny Bollman, MD;  Location: [facility] OR;  Service: Open Heart Surgery;  Laterality: N/A;    Social History  reports that he has quit smoking. He has never used smokeless tobacco. He reports that he does not currently use alcohol. He reports that he does not use drugs.  No Known Allergies  Family History  Problem Relation Age of Onset   Diabetes Mother    Stroke Father   Reviewed on admission  Prior to Admission medications   Medication Sig Start Date End Date Taking? Authorizing Provider  acetaminophen (TYLENOL) 650 MG CR tablet Take 325 mg by mouth as needed for pain.    [provider]  atorvastatin (LIPITOR) 40 MG tablet Take 1 tablet (40 mg total) by mouth daily at 6 PM. 09/29/19   Marinus Maw, MD  calcium carbonate (TUMS - DOSED IN MG ELEMENTAL CALCIUM) 500 MG chewable tablet Chew 1-2 tablets by mouth 3 (three) times daily as needed for indigestion or heartburn. Patient not taking: Reported on 03/23/2023    [provider]  clopidogrel (PLAVIX) 75 MG tablet Take 1 tablet by mouth once daily Patient taking differently: Take 75 mg by mouth daily. 10/28/20   Janetta Hora, PA-C  DULoxetine (CYMBALTA) 20 MG capsule Take 20 mg by mouth daily. Patient not taking: Reported on 03/23/2023    [provider]  finasteride (PROSCAR) 5 MG tablet Take 5 mg by mouth every evening.  Patient not taking: Reported on 03/23/2023    [provider]  fluticasone (FLONASE) 50 MCG/ACT nasal spray Place 2 sprays into both nostrils daily as needed for allergies or rhinitis. Patient not taking: Reported on 03/23/2023    [provider]   lisinopril (ZESTRIL) 2.5 MG tablet Take 1 tablet by mouth once daily 06/29/22   Tobb, Kardie, DO  memantine (NAMENDA TITRATION PAK) tablet pack 5 mg/day for =1 week; 5 mg twice daily for =1 week; 15 mg/day given in 5 mg and 10 mg separated doses for =1 week; then 10 mg twice daily 03/23/23   Micki Riley, MD  memantine (NAMENDA) 10 MG tablet Take 1 tablet (10 mg total) by mouth 2 (two) times daily. 03/23/23   Micki Riley, MD  mirtazapine (REMERON) 7.5 MG tablet Take 7.5 mg by mouth at bedtime. Patient not taking: Reported on 03/23/2023    [provider]  Naphazoline-Glycerin (CLEAR EYES MAX REDNESS RELIEF) 0.03-0.5 % SOLN Place 1-2 drops into both eyes 3 (three) times daily as needed (redness relief). Patient not taking: Reported on 03/23/2023    [provider]  venlafaxine XR (EFFEXOR-XR) 37.5 MG 24 hr capsule Take 37.5 mg by mouth daily. Patient not taking: Reported on 03/23/2023 10/20/22   [provider]    Physical Exam: Vitals:   06/23/23 1600 06/23/23 1720 06/23/23 1830  BP:   (!) 193/95  Pulse:  65  Temp:  97.6 F (36.4 C)   SpO2:   97%  Weight: 95 kg    Height: 5\' 11"  (1.803 m)      Physical Exam Constitutional:      General: He is not in acute distress.    Appearance: Normal appearance.  HENT:     Head: Normocephalic and atraumatic.     Mouth/Throat:     Mouth: Mucous membranes are moist.     Pharynx: Oropharynx is clear.  Eyes:     Extraocular Movements: Extraocular movements intact.     Pupils: Pupils are equal, round, and reactive to light.  Cardiovascular:     Rate and Rhythm: Normal rate and regular rhythm.     Pulses: Normal pulses.     Heart sounds: Normal heart sounds.  Pulmonary:     Effort: Pulmonary effort is normal. No respiratory distress.     Breath sounds: Normal breath sounds.  Abdominal:     General: Bowel sounds are normal. There is no distension.     Palpations: Abdomen is soft.     Tenderness: There is no  abdominal tenderness.  Musculoskeletal:        General: No swelling or deformity.  Skin:    General: Skin is warm and dry.  Neurological:     Comments: Mental Status: Patient is awake, alert, oriented to city and name.  Appears to be baseline. Has had some ongoing aphasia Cranial Nerves: II: Pupils equal, round, and reactive to light.   III,IV, VI: EOMI without ptosis or diploplia.  V: Facial sensation is symmetric to light touch. VII: Facial movement is symmetric.  VIII: hearing is intact to voice X: Uvula elevates symmetrically XI: Shoulder shrug is symmetric. XII: tongue is midline without atrophy or fasciculations.  Motor: Good effort thorughout, at Least 4+-5/5 RUE and RLE, 5/5 LUE and LLE  Sensory: Sensation is decreased but not absent at right upper extremity and lower extremity Cerebellar: Finger-Nose intact bilalat    Labs on Admission: I have personally reviewed following labs and imaging studies  CBC: Recent Labs  Lab 06/23/23 1646 06/23/23 1648  WBC  --  6.9  NEUTROABS  --  3.6  HGB 12.6* 13.1  HCT 37.0* 39.7  MCV  --  94.1  PLT  --  210    Basic Metabolic Panel: Recent Labs  Lab 06/23/23 1646 06/23/23 1648  NA 143 142  K 3.5 3.4*  CL 106 105  CO2  --  24  GLUCOSE 132* 136*  BUN 17 16  CREATININE 1.00 1.01  CALCIUM  --  8.8*    GFR: Estimated Creatinine Clearance: 57.2 mL/min (by C-G formula based on SCr of 1.01 mg/dL).  Liver Function Tests: Recent Labs  Lab 06/23/23 1648  AST 30  ALT 26  ALKPHOS 89  BILITOT 0.6  PROT 6.0*  ALBUMIN 3.5    Urine analysis:    Component Value Date/Time   COLORURINE YELLOW 09/20/2020 1134   APPEARANCEUR CLEAR 09/20/2020 1134   LABSPEC 1.023 09/20/2020 1134   PHURINE 5.0 09/20/2020 1134   GLUCOSEU NEGATIVE 09/20/2020 1134   HGBUR NEGATIVE 09/20/2020 1134   BILIRUBINUR NEGATIVE 09/20/2020 1134   KETONESUR NEGATIVE 09/20/2020 1134   PROTEINUR NEGATIVE 09/20/2020 1134   NITRITE NEGATIVE 09/20/2020  1134   LEUKOCYTESUR NEGATIVE 09/20/2020 1134    Radiological Exams on Admission: CT ANGIO HEAD NECK W WO CM (CODE STROKE)  Result Date: 06/23/2023 CLINICAL DATA:  Neuro deficit, acute, stroke suspected. Right-sided deficit.  EXAM: CT ANGIOGRAPHY HEAD AND NECK WITH AND WITHOUT CONTRAST TECHNIQUE: Multidetector CT imaging of the head and neck was performed using the standard protocol during bolus administration of intravenous contrast. Multiplanar CT image reconstructions and MIPs were obtained to evaluate the vascular anatomy. Carotid stenosis measurements (when applicable) are obtained utilizing NASCET criteria, using the distal internal carotid diameter as the denominator. RADIATION DOSE REDUCTION: This exam was performed according to the departmental dose-optimization program which includes automated exposure control, adjustment of the mA and/or kV according to patient size and/or use of iterative reconstruction technique. CONTRAST:  75mL OMNIPAQUE IOHEXOL 350 MG/ML SOLN COMPARISON:  Head CT 06/23/2023. CTA head 01/04/2023. MRI brain 08/26/2019. FINDINGS: CTA NECK FINDINGS Aortic arch: Four vessel arch configuration with separate origin of the left vertebral artery. Atherosclerotic calcifications of the aortic arch and arch vessel origins. Arch vessel origins are patent. Right carotid system: No evidence of dissection, stenosis (50% or greater), or occlusion. Calcified plaque of the right carotid bulb. Tortuous distal cervical ICA. Left carotid system: No evidence of dissection, stenosis (50% or greater), or occlusion. Mixed plaque of the left carotid bulb. Mildly tortuous distal cervical ICA. Vertebral arteries: Codominant. No evidence of dissection, stenosis (50% or greater), or occlusion. Skeleton: Mild cervical spondylosis without high-grade spinal canal stenosis. Other neck: Unremarkable. Upper chest: Unremarkable. Review of the MIP images confirms the above findings CTA HEAD FINDINGS Anterior  circulation: Calcified plaque along the carotid siphons without hemodynamically significant stenosis. The proximal ACAs and MCAs are patent without stenosis or aneurysm. Distal branches are symmetric. Posterior circulation: Normal basilar artery. The SCAs, AICAs and PICAs are patent proximally. The PCAs are patent proximally without stenosis or aneurysm. Distal branches are symmetric. Venous sinuses: As permitted by contrast timing, patent. Anatomic variants: Persistent fetal origin of the left PCA with hypoplastic left P1 segment. Review of the MIP images confirms the above findings IMPRESSION: No large vessel occlusion, hemodynamically significant stenosis, or aneurysm in the head or neck. Aortic Atherosclerosis (ICD10-I70.0). Electronically Signed   By: Orvan Falconer M.D.   On: 06/23/2023 17:15   CT HEAD CODE STROKE WO CONTRAST  Result Date: 06/23/2023 CLINICAL DATA:  Code stroke. Neuro deficit, acute, stroke suspected. Slurred speech and right-sided weakness. EXAM: CT HEAD WITHOUT CONTRAST TECHNIQUE: Contiguous axial images were obtained from the base of the skull through the vertex without intravenous contrast. RADIATION DOSE REDUCTION: This exam was performed according to the departmental dose-optimization program which includes automated exposure control, adjustment of the mA and/or kV according to patient size and/or use of iterative reconstruction technique. COMPARISON:  Head CT 01/04/2023. FINDINGS: Brain: New asymmetric hypoattenuation in the posterior limb of the left internal capsule, suspicious for acute infarct. Old infarct in the left thalamus. Background of moderate chronic small-vessel disease. No hydrocephalus or extra-axial collection. No mass effect or midline shift. Vascular: No hyperdense vessel or unexpected calcification. Skull: No calvarial fracture or suspicious bone lesion. Skull base is unremarkable. Sinuses/Orbits: Mild mucosal disease in the maxillary and frontal sinuses. Orbits  are unremarkable. Other: None. ASPECTS ([place] Stroke Program Early CT Score) - Ganglionic level infarction (caudate, lentiform nuclei, internal capsule, insula, M1-M3 cortex): 6 - Supraganglionic infarction (M4-M6 cortex): 3 Total score (0-10 with 10 being normal): 9 IMPRESSION: 1. New asymmetric hypoattenuation in the posterior limb of the left internal capsule, suspicious for acute infarct. No hemorrhage or mass effect. 2. ASPECT score is 9. Code stroke imaging results were communicated on 06/23/2023 at 5:09 pm to provider Dr. Derry Lory via secure text  paging. Electronically Signed   By: Orvan Falconer M.D.   On: 06/23/2023 17:09    EKG: Performed in the ED but not yet resulted.  Assessment/Plan Active Problems:   Coronary artery disease with PCI and stenting many years ago   Symptomatic severe aortic stenosis with normal ejection fraction   S/P TAVR (transcatheter aortic valve replacement)   HTN (hypertension)   Depression   Gastroesophageal reflux disease   History of CVA (cerebrovascular accident)   Pure hypercholesterolemia   BPH (benign prostatic hyperplasia)   Acute CVA > Patient presenting with right-sided sensory deficit and aphasia.  History of prior stroke. > CT scan shows changes consistent with acute CVA.  CTA head and neck showed no large vessel occlusion, significant stenosis, aneurysm. > Neurology has seen patient and are following. - Appreciate neurology recommendations - Allow for permissive HTN (systolic < 220 and diastolic < 120)  - MR Brain - Daily aspirin and Plavix - Continue home statin - Echocardiogram  - A1C  - Lipid panel  - Tele monitoring  - SLP eval - PT/OT  Hypertension - Holding home lisinopril in the setting of permissive hypertension as above   Hyperlipidemia - Continue home statin as above  Depression - Not currently on duloxetine  Memory impairment - Continue home Namenda  History of CAD - Continue home atorvastatin, Plavix -  Aspirin as above - Holding home lisinopril  History of aortic stenosis status post TAVR - Noted   DVT prophylaxis: SCDs Code Status:   Full  Family Communication:  Wife, updated by phone  Disposition Plan:   Patient is from:  Home  Anticipated DC to:  Home  Anticipated DC date:  1 to 2 days  Anticipated DC barriers: None  Consults called:  Neurology Admission status:  Observation, telemetry  Severity of Illness: The appropriate patient status for this patient is OBSERVATION. Observation status is judged to be reasonable and necessary in order to provide the required intensity of service to ensure the patient's safety. The patient's presenting symptoms, physical exam findings, and initial radiographic and laboratory data in the context of their medical condition is felt to place them at decreased risk for further clinical deterioration. Furthermore, it is anticipated that the patient will be medically stable for discharge from the hospital within 2 midnights of admission.    Synetta Fail MD [facility]  How to contact the [facility] Attending or Consulting provider 7A - 7P or covering provider during after hours 7P -7A, for this patient?   Check the care team in [facility] and look for a) attending/consulting [facility] provider listed and b) the [facility] team listed Log into [email or web address] and use [place]'s universal password to access. If you do not have the password, please contact the hospital operator. Locate the [facility] provider you are looking for under [facility] and page to a number that you can be directly reached. If you still have difficulty reaching the provider, please page the [facility] (Director on Call) for the Hospitalists listed on amion for assistance.  06/23/2023, 6:46 PM

## 2023-06-23 NOTE — ED Provider Notes (Signed)
[place] EMERGENCY DEPARTMENT AT [facility] Provider Note   CSN: [id number] Arrival date & time: 06/23/23  1639     History  No chief complaint on file.   Michael Kidd is a 89 y.o. male.  Patient is a 89 year old male with a history of hypertension, CAD status post PCI, stroke, ischemic heart disease, aortic stenosis status post TAVR who is still currently living at home with he and his wife and EMS was called today due to complaint of right-sided sensory loss and aphasia.  Initially EMS reports that patient was last seen normal at 330 today however after discussion with patient's wife it is difficult to get a timeline on when he was last normal.  She reports that since he came home from the hospital in January he has not been himself.  She did note that this morning he was difficult to arouse.  Given this unknown last normal time.    The history is provided by the patient. The history is limited by the absence of a caregiver.       Home Medications Prior to Admission medications   Medication Sig Start Date End Date Taking? Authorizing Provider  acetaminophen (TYLENOL) 650 MG CR tablet Take 325 mg by mouth as needed for pain.    [provider]  atorvastatin (LIPITOR) 40 MG tablet Take 1 tablet (40 mg total) by mouth daily at 6 PM. 09/29/19   Marinus Maw, MD  calcium carbonate (TUMS - DOSED IN MG ELEMENTAL CALCIUM) 500 MG chewable tablet Chew 1-2 tablets by mouth 3 (three) times daily as needed for indigestion or heartburn. Patient not taking: Reported on 03/23/2023    [provider]  clopidogrel (PLAVIX) 75 MG tablet Take 1 tablet by mouth once daily Patient taking differently: Take 75 mg by mouth daily. 10/28/20   Janetta Hora, PA-C  DULoxetine (CYMBALTA) 20 MG capsule Take 20 mg by mouth daily. Patient not taking: Reported on 03/23/2023    [provider]  finasteride (PROSCAR) 5 MG tablet Take 5 mg by mouth every evening.   Patient not taking: Reported on 03/23/2023    [provider]  fluticasone (FLONASE) 50 MCG/ACT nasal spray Place 2 sprays into both nostrils daily as needed for allergies or rhinitis. Patient not taking: Reported on 03/23/2023    [provider]  lisinopril (ZESTRIL) 2.5 MG tablet Take 1 tablet by mouth once daily 06/29/22   Tobb, Kardie, DO  memantine (NAMENDA TITRATION PAK) tablet pack 5 mg/day for =1 week; 5 mg twice daily for =1 week; 15 mg/day given in 5 mg and 10 mg separated doses for =1 week; then 10 mg twice daily 03/23/23   Micki Riley, MD  memantine (NAMENDA) 10 MG tablet Take 1 tablet (10 mg total) by mouth 2 (two) times daily. 03/23/23   Micki Riley, MD  mirtazapine (REMERON) 7.5 MG tablet Take 7.5 mg by mouth at bedtime. Patient not taking: Reported on 03/23/2023    [provider]  Naphazoline-Glycerin (CLEAR EYES MAX REDNESS RELIEF) 0.03-0.5 % SOLN Place 1-2 drops into both eyes 3 (three) times daily as needed (redness relief). Patient not taking: Reported on 03/23/2023    [provider]  venlafaxine XR (EFFEXOR-XR) 37.5 MG 24 hr capsule Take 37.5 mg by mouth daily. Patient not taking: Reported on 03/23/2023 10/20/22   [provider]      Allergies    Patient has no known allergies.    Review of  Systems   Review of Systems  Physical Exam Updated Vital Signs Temp 97.6 F (36.4 C)   Ht 5\' 11"  (1.803 m)   Wt 95 kg   BMI 29.21 kg/m  Physical Exam Vitals and nursing note reviewed.  Constitutional:      General: He is not in acute distress.    Appearance: He is well-developed.  HENT:     Head: Normocephalic and atraumatic.  Eyes:     Conjunctiva/sclera: Conjunctivae normal.     Pupils: Pupils are equal, round, and reactive to light.  Cardiovascular:     Rate and Rhythm: Normal rate and regular rhythm.     Heart sounds: No murmur heard. Pulmonary:     Effort: Pulmonary effort is normal. No respiratory distress.      Breath sounds: Normal breath sounds. No wheezing or rales.  Abdominal:     General: There is no distension.     Palpations: Abdomen is soft.     Tenderness: There is no abdominal tenderness. There is no guarding or rebound.  Musculoskeletal:        General: No tenderness. Normal range of motion.     Cervical back: Normal range of motion and neck supple.  Skin:    General: Skin is warm and dry.     Findings: No erythema or rash.  Neurological:     Mental Status: He is alert and oriented to person, place, and time.     Sensory: Sensory deficit present.     Comments: Mild aphasia with speech, decreased sensation in the right upper and lower extremity  Psychiatric:        Mood and Affect: Mood normal.        Behavior: Behavior normal.     ED Results / Procedures / Treatments   Labs (all labs ordered are listed, but only abnormal results are displayed) Labs Reviewed  COMPREHENSIVE METABOLIC PANEL - Abnormal; Notable for the following components:      Result Value   Potassium 3.4 (*)    Glucose, Bld 136 (*)    Calcium 8.8 (*)    Total Protein 6.0 (*)    All other components within normal limits  I-STAT CHEM 8, ED - Abnormal; Notable for the following components:   Glucose, Bld 132 (*)    Calcium, Ion 1.09 (*)    Hemoglobin 12.6 (*)    HCT 37.0 (*)    All other components within normal limits  CBG MONITORING, ED - Abnormal; Notable for the following components:   Glucose-Capillary 127 (*)    All other components within normal limits  PROTIME-INR  APTT  CBC  DIFFERENTIAL  ETHANOL    EKG None  Radiology CT ANGIO HEAD NECK W WO CM (CODE STROKE)  Result Date: 06/23/2023 CLINICAL DATA:  Neuro deficit, acute, stroke suspected. Right-sided deficit. EXAM: CT ANGIOGRAPHY HEAD AND NECK WITH AND WITHOUT CONTRAST TECHNIQUE: Multidetector CT imaging of the head and neck was performed using the standard protocol during bolus administration of intravenous contrast. Multiplanar CT  image reconstructions and MIPs were obtained to evaluate the vascular anatomy. Carotid stenosis measurements (when applicable) are obtained utilizing NASCET criteria, using the distal internal carotid diameter as the denominator. RADIATION DOSE REDUCTION: This exam was performed according to the departmental dose-optimization program which includes automated exposure control, adjustment of the mA and/or kV according to patient size and/or use of iterative reconstruction technique. CONTRAST:  75mL OMNIPAQUE IOHEXOL 350 MG/ML SOLN COMPARISON:  Head CT 06/23/2023. CTA head 01/04/2023.  MRI brain 08/26/2019. FINDINGS: CTA NECK FINDINGS Aortic arch: Four vessel arch configuration with separate origin of the left vertebral artery. Atherosclerotic calcifications of the aortic arch and arch vessel origins. Arch vessel origins are patent. Right carotid system: No evidence of dissection, stenosis (50% or greater), or occlusion. Calcified plaque of the right carotid bulb. Tortuous distal cervical ICA. Left carotid system: No evidence of dissection, stenosis (50% or greater), or occlusion. Mixed plaque of the left carotid bulb. Mildly tortuous distal cervical ICA. Vertebral arteries: Codominant. No evidence of dissection, stenosis (50% or greater), or occlusion. Skeleton: Mild cervical spondylosis without high-grade spinal canal stenosis. Other neck: Unremarkable. Upper chest: Unremarkable. Review of the MIP images confirms the above findings CTA HEAD FINDINGS Anterior circulation: Calcified plaque along the carotid siphons without hemodynamically significant stenosis. The proximal ACAs and MCAs are patent without stenosis or aneurysm. Distal branches are symmetric. Posterior circulation: Normal basilar artery. The SCAs, AICAs and PICAs are patent proximally. The PCAs are patent proximally without stenosis or aneurysm. Distal branches are symmetric. Venous sinuses: As permitted by contrast timing, patent. Anatomic variants:  Persistent fetal origin of the left PCA with hypoplastic left P1 segment. Review of the MIP images confirms the above findings IMPRESSION: No large vessel occlusion, hemodynamically significant stenosis, or aneurysm in the head or neck. Aortic Atherosclerosis (ICD10-I70.0). Electronically Signed   By: Orvan Falconer M.D.   On: 06/23/2023 17:15   CT HEAD CODE STROKE WO CONTRAST  Result Date: 06/23/2023 CLINICAL DATA:  Code stroke. Neuro deficit, acute, stroke suspected. Slurred speech and right-sided weakness. EXAM: CT HEAD WITHOUT CONTRAST TECHNIQUE: Contiguous axial images were obtained from the base of the skull through the vertex without intravenous contrast. RADIATION DOSE REDUCTION: This exam was performed according to the departmental dose-optimization program which includes automated exposure control, adjustment of the mA and/or kV according to patient size and/or use of iterative reconstruction technique. COMPARISON:  Head CT 01/04/2023. FINDINGS: Brain: New asymmetric hypoattenuation in the posterior limb of the left internal capsule, suspicious for acute infarct. Old infarct in the left thalamus. Background of moderate chronic small-vessel disease. No hydrocephalus or extra-axial collection. No mass effect or midline shift. Vascular: No hyperdense vessel or unexpected calcification. Skull: No calvarial fracture or suspicious bone lesion. Skull base is unremarkable. Sinuses/Orbits: Mild mucosal disease in the maxillary and frontal sinuses. Orbits are unremarkable. Other: None. ASPECTS (Alberta Stroke Program Early CT Score) - Ganglionic level infarction (caudate, lentiform nuclei, internal capsule, insula, M1-M3 cortex): 6 - Supraganglionic infarction (M4-M6 cortex): 3 Total score (0-10 with 10 being normal): 9 IMPRESSION: 1. New asymmetric hypoattenuation in the posterior limb of the left internal capsule, suspicious for acute infarct. No hemorrhage or mass effect. 2. ASPECT score is 9. Code stroke  imaging results were communicated on 06/23/2023 at 5:09 pm to provider Dr. Derry Lory via secure text paging. Electronically Signed   By: Orvan Falconer M.D.   On: 06/23/2023 17:09    Procedures Procedures    Medications Ordered in ED Medications  sodium chloride flush (NS) 0.9 % injection 3 mL (has no administration in time range)  iohexol (OMNIPAQUE) 350 MG/ML injection 75 mL (75 mLs Intravenous Contrast Given 06/23/23 1700)    ED Course/ Medical Decision Making/ A&P                             Medical Decision Making Amount and/or Complexity of Data Reviewed External Data Reviewed: notes. Labs: ordered. Decision-making details  documented in ED Course. Radiology: ordered and independent interpretation performed. Decision-making details documented in ED Course. ECG/medicine tests: ordered.  Risk Decision regarding hospitalization.   Pt with multiple medical problems and comorbidities and presenting today with a complaint that caries a high risk for morbidity and mortality.  Here today initially as a code stroke due to new neurologic deficits however it is unclear when he was last normal which does not make him a TNKase candidate.  Upon arrival patient was seen by the stroke team he does have noted deficits in the right side as well as aphasia.  I have independently visualized and interpreted pt's images today.  Head CT without evidence of acute bleed radiology reports new asymmetric hypoattenuation in the posterior limb of the left internal capsule suspicious for acute infarct but no hemorrhage or mass effect.  I independently reviewed patient's labs CBG stable, i-STAT without acute findings, EtOH, CBC, CMP without acute findings.  CTA did not show large vessel occlusion.  Dr. Derry Lory has evaluated the patient with neurology and they will follow the patient.  Patient will need admission to the hospitalist service for stroke workup.  Findings discussed with the patient and at this time  he is comfortable with this plan.  Patient continues to have numbness in the right arm and leg and has a notable right-sided facial droop.  He is able to lift his arm and his leg and does still have fairly good coordination.          Final Clinical Impression(s) / ED Diagnoses Final diagnoses:  Cerebrovascular accident (CVA), unspecified mechanism [facility])    Rx / DC Orders ED Discharge Orders     None         Gwyneth Sprout, MD 06/23/23 1824

## 2023-06-23 NOTE — ED Notes (Signed)
Patient returned from MRI.  Stretcher locked in lowest position, call bell in reach, pt comfortable.

## 2023-06-23 NOTE — ED Notes (Signed)
Patient transported to MRI 

## 2023-06-24 ENCOUNTER — Other Ambulatory Visit: Payer: Self-pay

## 2023-06-24 ENCOUNTER — Observation Stay (HOSPITAL_BASED_OUTPATIENT_CLINIC_OR_DEPARTMENT_OTHER): Payer: Medicare HMO

## 2023-06-24 DIAGNOSIS — I082 Rheumatic disorders of both aortic and tricuspid valves: Secondary | ICD-10-CM

## 2023-06-24 DIAGNOSIS — E876 Hypokalemia: Secondary | ICD-10-CM | POA: Diagnosis present

## 2023-06-24 DIAGNOSIS — F322 Major depressive disorder, single episode, severe without psychotic features: Secondary | ICD-10-CM | POA: Diagnosis not present

## 2023-06-24 DIAGNOSIS — G8191 Hemiplegia, unspecified affecting right dominant side: Secondary | ICD-10-CM | POA: Diagnosis present

## 2023-06-24 DIAGNOSIS — N179 Acute kidney failure, unspecified: Secondary | ICD-10-CM | POA: Diagnosis not present

## 2023-06-24 DIAGNOSIS — E1140 Type 2 diabetes mellitus with diabetic neuropathy, unspecified: Secondary | ICD-10-CM | POA: Diagnosis present

## 2023-06-24 DIAGNOSIS — R471 Dysarthria and anarthria: Secondary | ICD-10-CM | POA: Diagnosis present

## 2023-06-24 DIAGNOSIS — I2510 Atherosclerotic heart disease of native coronary artery without angina pectoris: Secondary | ICD-10-CM | POA: Diagnosis present

## 2023-06-24 DIAGNOSIS — R29810 Facial weakness: Secondary | ICD-10-CM | POA: Diagnosis present

## 2023-06-24 DIAGNOSIS — N400 Enlarged prostate without lower urinary tract symptoms: Secondary | ICD-10-CM

## 2023-06-24 DIAGNOSIS — Z7409 Other reduced mobility: Secondary | ICD-10-CM | POA: Diagnosis present

## 2023-06-24 DIAGNOSIS — I517 Cardiomegaly: Secondary | ICD-10-CM

## 2023-06-24 DIAGNOSIS — H9190 Unspecified hearing loss, unspecified ear: Secondary | ICD-10-CM | POA: Diagnosis present

## 2023-06-24 DIAGNOSIS — Z85820 Personal history of malignant melanoma of skin: Secondary | ICD-10-CM | POA: Diagnosis not present

## 2023-06-24 DIAGNOSIS — I951 Orthostatic hypotension: Secondary | ICD-10-CM | POA: Diagnosis not present

## 2023-06-24 DIAGNOSIS — I6381 Other cerebral infarction due to occlusion or stenosis of small artery: Secondary | ICD-10-CM | POA: Diagnosis present

## 2023-06-24 DIAGNOSIS — Z79899 Other long term (current) drug therapy: Secondary | ICD-10-CM | POA: Diagnosis not present

## 2023-06-24 DIAGNOSIS — G479 Sleep disorder, unspecified: Secondary | ICD-10-CM | POA: Diagnosis not present

## 2023-06-24 DIAGNOSIS — I361 Nonrheumatic tricuspid (valve) insufficiency: Secondary | ICD-10-CM

## 2023-06-24 DIAGNOSIS — R29706 NIHSS score 6: Secondary | ICD-10-CM | POA: Diagnosis present

## 2023-06-24 DIAGNOSIS — E7800 Pure hypercholesterolemia, unspecified: Secondary | ICD-10-CM | POA: Diagnosis present

## 2023-06-24 DIAGNOSIS — I5030 Unspecified diastolic (congestive) heart failure: Secondary | ICD-10-CM | POA: Diagnosis not present

## 2023-06-24 DIAGNOSIS — R4701 Aphasia: Secondary | ICD-10-CM | POA: Diagnosis present

## 2023-06-24 DIAGNOSIS — R194 Change in bowel habit: Secondary | ICD-10-CM | POA: Diagnosis not present

## 2023-06-24 DIAGNOSIS — R451 Restlessness and agitation: Secondary | ICD-10-CM | POA: Diagnosis not present

## 2023-06-24 DIAGNOSIS — I69322 Dysarthria following cerebral infarction: Secondary | ICD-10-CM | POA: Diagnosis not present

## 2023-06-24 DIAGNOSIS — I639 Cerebral infarction, unspecified: Secondary | ICD-10-CM | POA: Diagnosis not present

## 2023-06-24 DIAGNOSIS — K5900 Constipation, unspecified: Secondary | ICD-10-CM | POA: Diagnosis not present

## 2023-06-24 DIAGNOSIS — I350 Nonrheumatic aortic (valve) stenosis: Secondary | ICD-10-CM | POA: Diagnosis not present

## 2023-06-24 DIAGNOSIS — Z7902 Long term (current) use of antithrombotics/antiplatelets: Secondary | ICD-10-CM | POA: Diagnosis not present

## 2023-06-24 DIAGNOSIS — F03A3 Unspecified dementia, mild, with mood disturbance: Secondary | ICD-10-CM | POA: Diagnosis present

## 2023-06-24 DIAGNOSIS — Z953 Presence of xenogenic heart valve: Secondary | ICD-10-CM | POA: Diagnosis not present

## 2023-06-24 DIAGNOSIS — K760 Fatty (change of) liver, not elsewhere classified: Secondary | ICD-10-CM | POA: Diagnosis present

## 2023-06-24 DIAGNOSIS — Z955 Presence of coronary angioplasty implant and graft: Secondary | ICD-10-CM | POA: Diagnosis not present

## 2023-06-24 DIAGNOSIS — R413 Other amnesia: Secondary | ICD-10-CM | POA: Diagnosis present

## 2023-06-24 DIAGNOSIS — Z1152 Encounter for screening for COVID-19: Secondary | ICD-10-CM | POA: Diagnosis not present

## 2023-06-24 DIAGNOSIS — F32A Depression, unspecified: Secondary | ICD-10-CM | POA: Diagnosis not present

## 2023-06-24 DIAGNOSIS — I10 Essential (primary) hypertension: Secondary | ICD-10-CM | POA: Diagnosis not present

## 2023-06-24 DIAGNOSIS — Z8673 Personal history of transient ischemic attack (TIA), and cerebral infarction without residual deficits: Secondary | ICD-10-CM

## 2023-06-24 DIAGNOSIS — I252 Old myocardial infarction: Secondary | ICD-10-CM | POA: Diagnosis not present

## 2023-06-24 DIAGNOSIS — F0153 Vascular dementia, unspecified severity, with mood disturbance: Secondary | ICD-10-CM | POA: Diagnosis not present

## 2023-06-24 DIAGNOSIS — I69359 Hemiplegia and hemiparesis following cerebral infarction affecting unspecified side: Secondary | ICD-10-CM | POA: Diagnosis not present

## 2023-06-24 DIAGNOSIS — I6930 Unspecified sequelae of cerebral infarction: Secondary | ICD-10-CM | POA: Diagnosis not present

## 2023-06-24 DIAGNOSIS — R45851 Suicidal ideations: Secondary | ICD-10-CM | POA: Diagnosis not present

## 2023-06-24 LAB — ECHOCARDIOGRAM COMPLETE
AV Mean grad: 9 mmHg
AV Peak grad: 17.6 mmHg
Ao pk vel: 2.1 m/s
Area-P 1/2: 3.53 cm2
Height: 71 in
P 1/2 time: 611 msec
S' Lateral: 2.8 cm
Weight: 3350.99 oz

## 2023-06-24 LAB — LIPID PANEL
Cholesterol: 100 mg/dL (ref 0–200)
HDL: 31 mg/dL — ABNORMAL LOW (ref >40)
LDL Cholesterol: 50 mg/dL (ref 0–99)
Total CHOL/HDL Ratio: 3.2 RATIO
Triglycerides: 94 mg/dL (ref <150)
VLDL: 19 mg/dL (ref 0–40)

## 2023-06-24 MED ORDER — LISINOPRIL 20 MG PO TABS: 20.0000 mg | ORAL_TABLET | Freq: Every day | ORAL | Status: DC

## 2023-06-24 MED ORDER — ASPIRIN 81 MG PO TBEC
81.0000 mg | DELAYED_RELEASE_TABLET | Freq: Every day | ORAL | Status: DC
  Administered 2023-06-25 – 2023-07-01 (×7): 81 mg via ORAL
  Filled 2023-06-24 (×7): qty 1

## 2023-06-24 MED ORDER — ENOXAPARIN SODIUM 40 MG/0.4ML IJ SOSY
40.0000 mg | PREFILLED_SYRINGE | INTRAMUSCULAR | Status: DC
  Administered 2023-06-24 – 2023-06-30 (×7): 40 mg via SUBCUTANEOUS
  Filled 2023-06-24 (×7): qty 0.4

## 2023-06-24 MED ORDER — TICAGRELOR 90 MG PO TABS
90.0000 mg | ORAL_TABLET | Freq: Two times a day (BID) | ORAL | Status: DC
  Administered 2023-06-25 – 2023-07-01 (×13): 90 mg via ORAL
  Filled 2023-06-24 (×14): qty 1

## 2023-06-24 MED ORDER — ATORVASTATIN CALCIUM 80 MG PO TABS
80.0000 mg | ORAL_TABLET | Freq: Every evening | ORAL | Status: DC
  Administered 2023-06-24 – 2023-06-30 (×7): 80 mg via ORAL
  Filled 2023-06-24 (×7): qty 1

## 2023-06-24 NOTE — Progress Notes (Signed)
Inpatient Rehab Admissions Coordinator Note:   Per therapy recommendations patient was screened for CIR candidacy by Stephania Fragmin, PT. At this time, pt appears to be a potential candidate for CIR. I will place an order for rehab consult for full assessment, per our protocol.  Please contact me any with questions.Estill Dooms, PT, DPT [phone] 06/24/23 1:34 PM

## 2023-06-24 NOTE — Evaluation (Signed)
Speech Language Pathology Evaluation Patient Details Name: Michael Kidd MRN: [id number] DOB: 1930/09/03 Today's Date: 06/24/2023 Time: 7829-5621 SLP Time Calculation (min) (ACUTE ONLY): 60 min  Problem List:  Patient Active Problem List   Diagnosis Date Noted   Acute CVA (cerebrovascular accident) (HCC) 06/23/2023   History of malignant melanoma 07/02/2021   Myocardial infarction [facility])    Dysthymia    BPH (benign prostatic hyperplasia)    Bilateral chronic knee pain    Nonsustained ventricular tachycardia (HCC) 11/12/2020   HTN (hypertension)    Arthritis    S/P TAVR (transcatheter aortic valve replacement) 09/24/2020   Symptomatic severe aortic stenosis with normal ejection fraction 08/15/2020   Dyspnea on exertion 08/15/2020   Murmur 05/23/2020   Coronary artery disease with PCI and stenting many years ago 10/19/2019   Late effect of cerebrovascular accident (CVA) 10/19/2019   History of CVA (cerebrovascular accident) 10/12/2019   Pure hypercholesterolemia 09/14/2018   Neuropathic pain, leg, bilateral 11/23/2017   Chronic pain of both knees 09/01/2017   Quadriceps weakness 09/01/2017   Risk for falls 10/01/2016   Gastroesophageal reflux disease 08/18/2016   Melanoma in situ of right upper extremity including shoulder (HCC) 08/07/2016   Dupuytren's contracture 07/15/2016   Flexor tendon bowstring 01/06/2016   Allergic rhinitis 03/29/2014   Dizziness 03/12/2014   Renal cyst 03/02/2014   Hepatic steatosis 03/02/2014   Depression 02/14/2014   Chronic ischemic heart disease 08/13/2010   Past Medical History:  Past Medical History:  Diagnosis Date   Allergic rhinitis 03/29/2014   Aortic valve stenosis 05/23/2020   Arthralgia 07/15/2016   Arthritis    Benign prostatic hyperplasia 02/14/2014   Benign prostatic hyperplasia with urinary frequency 11/02/2018   Bilateral chronic knee pain    BPH (benign prostatic hyperplasia)    CAD (coronary artery disease)    s/p remote LAD  PCI   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 PHQ-9 completed 02/26/14 PHQ-2 completed 02/14/14   Dizziness 03/12/2014   Dupuytren's contracture 07/15/2016   Dyspnea on exertion 08/15/2020   Dysthymia    Essential hypertension 03/12/2014   Flexor tendon bowstring 01/06/2016   Gastroesophageal reflux disease 08/18/2016   Hepatic steatosis 03/02/2014   History of CVA (cerebrovascular accident) 10/12/2019   HLD (hyperlipidemia) 02/14/2014   HTN (hypertension)    Late effect of cerebrovascular accident (CVA) 10/19/2019   Melanoma in situ of right upper extremity including shoulder (HCC) 08/07/2016   Formatting of this note might be different from the original. August 2017.  Dr. Chancy Milroy, [facility].   Murmur 05/23/2020   Myocardial infarction [facility])    Neuropathic pain, leg, bilateral 11/23/2017   Nonsustained ventricular tachycardia (HCC) 11/12/2020   Pure hypercholesterolemia 09/14/2018   Quadriceps weakness 09/01/2017   Renal cyst    Risk for falls 10/01/2016   S/P TAVR (transcatheter aortic valve replacement) 09/24/2020   Short of breath on exertion    per patient   Stroke [facility])    November   Symptomatic severe aortic stenosis with normal ejection fraction 08/15/2020   Past Surgical History:  Past Surgical History:  Procedure Laterality Date   CATARACT EXTRACTION W/ INTRAOCULAR LENS  IMPLANT, BILATERAL     cataract repair     HERNIA REPAIR     unsure of date  LOOP RECORDER INSERTION N/A 08/28/2019   Procedure: LOOP RECORDER INSERTION;  Surgeon: Marinus Maw, MD;  Location: [facility] INVASIVE CV LAB;  Service: Cardiovascular;  Laterality: N/A;   PERCUTANEOUS CORONARY STENT INTERVENTION (PCI-S)     RIGHT/LEFT HEART CATH AND CORONARY ANGIOGRAPHY N/A 08/22/2020   Procedure: RIGHT/LEFT HEART  CATH AND CORONARY ANGIOGRAPHY;  Surgeon: Marykay Lex, MD;  Location: [facility] INVASIVE CV LAB;  Service: Cardiovascular;  Laterality: N/A;   TEE WITHOUT CARDIOVERSION N/A 09/24/2020   Procedure: TRANSESOPHAGEAL ECHOCARDIOGRAM (TEE);  Surgeon: Tonny Bollman, MD;  Location: [facility] OR;  Service: Open Heart Surgery;  Laterality: N/A;   TRANSCATHETER AORTIC VALVE REPLACEMENT, TRANSFEMORAL N/A 09/24/2020   Procedure: TRANSCATHETER AORTIC VALVE REPLACEMENT, TRANSFEMORAL - USING EDWARDS SAPIEN 3 ULTRA  VALVE SIZE ;  Surgeon: Tonny Bollman, MD;  Location: [facility] OR;  Service: Open Heart Surgery;  Laterality: N/A;   HPI:  89 yo male adm to [facility] with slurred speech, aphasia, right facial asymmetry and right sided weakness. Brain imaging showed Small area of acute ischemia of the left corpus striatum. No hemorrhage or mass effect. Old right parietal and occipital infarcts. Old small vessel infarcts of the basal ganglia, cerebellum and corona radiata. H/O CVA 2020 and 2024 Jan. Pt reports he lives with family and they manage all of his bills, medications, etc.   Assessment / Plan / Recommendation Clinical Impression  No family presnt to establish baseline function but per prior SLP evaluation in 08/2019 after Rt MCA CVA, pt was reported to have baseline cognitive deficits.   Today he demonstrates significant CN deficits involving facial, hypoglossal, trigeminal nerve (tensor veli palatini), cranial accessory and vagus.  Dysarthria and receptive/language deficits noted.  Low phonation strength as well as imprecise articulation noted.  Pt able to understanding questions about his current needs but expression was difficult.  Pt did gesture for "eat" when requesting food *SLP had just called and ordered his meal.    He was able to follow concrete one step directions inconsistently but unable beyond this.   Portions of Western Aphasia Battery administered with pt scoring 6/10 on yes/no questions, breaking down at more  complex.  Sequential commands 0/10.  Repetition 8/10.  Object naming 9/10 Use of sentence and phrase completion helpful to elicit correct language.  In addition, pt is fluent with automatic speech taskss and singing.  He identified his name written and was able to name letters of other words but did not correctly read them.  No motor planning deficits observed.  Speech was dysfluent for novel expression with repetitions of first sound x3 when expressing self during the session.   Pt also noted to become lethargic, closing his eyes during the session and nodding off but was able to recover from this with stimulation.   Suspect this impacting his performance.  He admits he did not sleep well last night.  No family present to establish baseline communication but per prior evaluation from SLP after right MCA CVA, his language was intact.  Recommend follow up SLP to address speech/language to maximize his recovery and aid his participation in medical care and decrease caregiver burden.  Advised pt to recommendations and results.    SLP Assessment  SLP Recommendation/Assessment: Patient needs continued Speech Lanaguage Pathology Services SLP Visit Diagnosis: Dysarthria and anarthria (R47.1);Aphasia (R47.01)    Recommendations for follow up therapy are one component of a multi-disciplinary discharge planning process, led by the attending physician.  Recommendations may be updated based on patient status, additional functional  criteria and insurance authorization.    Follow Up Recommendations       Assistance Recommended at Discharge     Functional Status Assessment Patient has had a recent decline in their functional status and demonstrates the ability to make significant improvements in function in a reasonable and predictable amount of time.  Frequency and Duration min 1 x/week  1 week      SLP Evaluation Cognition  Overall Cognitive Status: No family/caregiver present to determine baseline  cognitive functioning (cog deficits noted in 08/2019 evaluation that were baseline at that time per prior slp eval after right mca cva) Memory: Impaired Memory Impairment: Prospective memory (did not recall that he asked SLP to bring him coffee when eval was done) Problem Solving: Impaired Problem Solving Impairment: Functional basic Safety/Judgment: Impaired (pt states he wants to go home despite knowing he had a stroke and has weakness with speech and language deficits)       Comprehension  Auditory Comprehension Overall Auditory Comprehension: Impaired Yes/No Questions: Impaired Basic Biographical Questions: 76-100% accurate Basic Immediate Environment Questions: 75-100% accurate Complex Questions: 0-24% accurate Commands: Impaired One Step Basic Commands: 75-100% accurate Two Step Basic Commands: 0-24% accurate Conversation: Simple EffectiveTechniques: Extra processing time;Repetition;Visual/Gestural cues Visual Recognition/Discrimination Discrimination: Not tested Reading Comprehension Reading Status: Impaired (pt able to name letters and recognize his name but not other basic words, uncertain if baseline)    Expression Expression Primary Mode of Expression: Verbal Verbal Expression Overall Verbal Expression: Impaired Initiation: No impairment Automatic Speech: Singing;Name;Social Response Level of Generative/Spontaneous Verbalization: Phrase Repetition: No impairment (x dysarthria) Pragmatics: No impairment Effective Techniques: Sentence completion Non-Verbal Means of Communication: Not applicable Written Expression Dominant Hand: Right Written Expression: Not tested   Oral / Motor  Oral Motor/Sensory Function Overall Oral Motor/Sensory Function: Moderate impairment Facial ROM: Reduced right Facial Symmetry: Abnormal symmetry right Facial Strength: Reduced right Facial Sensation: Within Functional Limits Lingual ROM: Reduced right Lingual Symmetry: Abnormal  symmetry right Lingual Strength: Reduced Lingual Sensation: Reduced Velum: Impaired right Mandible: Within Functional Limits Motor Speech Overall Motor Speech: Appears within functional limits for tasks assessed Respiration: Impaired Level of Impairment: Phrase Phonation: Low vocal intensity Resonance: Within functional limits Articulation: Impaired Level of Impairment: Phrase Intelligibility: Intelligibility reduced Word: 75-100% accurate Phrase: 75-100% accurate Sentence: 50-74% accurate Conversation: Not tested Motor Planning: Witnin functional limits Motor Speech Errors: Not applicable Effective Techniques: Slow rate;Increased vocal intensity           Rolena Infante, MS [facility] SLP Acute Rehab Services Office [phone]  Chales Abrahams 06/24/2023, 10:29 AM

## 2023-06-24 NOTE — Evaluation (Signed)
Physical Therapy Evaluation Patient Details Name: Michael Kidd MRN: [id number] DOB: 03/26/1930 Today's Date: 06/24/2023  History of Present Illness  Pt is a 89 y/o male presenting 7/17 with slurred speech, facial droop and R sided deficits. MRI brain showed small area of acute ischemia of L corpus striatum. PMH: HTN, HLD, GERD, CVA, depression, BPH, aortic stenosis status post TAVR, CAD s/p stenting.  Clinical Impression  Pt admitted with above diagnosis. OT obtained majority of hx from grandson prior to PT evaluation. Cognition likely worse than baseline based on this report. Very poor short term memory, oriented to self but cannot recall situation despite orienting several times during session. Provides incorrect year "2026", does not know month or current season. 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 Reduced awareness of deficits. Required mod assist for transfer from recliner, with practice and use of RW progressed to min assist. Min assist for gait throughout room, needing help controlling walker 50% of the time. No overt buckling however and is able to maintain grip at all times. Per grandson, wife can do IADLs at home but not able to physically assist. CIR may be able to progress pt to supervision level prior to return home with wife. Will request they look into his case. Patient will benefit from intensive inpatient follow up therapy, >3 hours/day. Pt currently with functional limitations due to the deficits listed below (see PT Problem List). Pt will benefit from acute skilled PT to increase their independence and safety with mobility to allow discharge.           Assistance Recommended at Discharge Frequent or constant Supervision/Assistance  If plan is discharge home, recommend the following:  Can travel by private vehicle  A lot of help with walking and/or transfers;A lot of help with bathing/dressing/bathroom;Assistance with  cooking/housework;Direct supervision/assist for medications management;Direct supervision/assist for financial management;Assist for transportation;Help with stairs or ramp for entrance        Equipment Recommendations None recommended by PT (TBD next venue)  Recommendations for Other Services  Rehab consult    Functional Status Assessment Patient has had a recent decline in their functional status and demonstrates the ability to make significant improvements in function in a reasonable and predictable amount of time.     Precautions / Restrictions Precautions Precautions: Fall Restrictions Weight Bearing Restrictions: No      Mobility  Bed Mobility               General bed mobility comments: In recliner    Transfers Overall transfer level: Needs assistance Equipment used: Rolling walker (2 wheels), 1 person hand held assist Transfers: Sit to/from Stand Sit to Stand: Mod assist           General transfer comment: Mod assist for boost to stand with initial attempt no AD, hand held assist. With RW for support, required min assist for boost, Cues for hand placement.    Ambulation/Gait Ambulation/Gait assistance: Min assist Gait Distance (Feet): 32 Feet Assistive device: Rolling walker (2 wheels) Gait Pattern/deviations: Step-through pattern, Decreased stride length, Decreased weight shift to right, Shuffle, Drifts right/left, Trunk flexed Gait velocity: decr Gait velocity interpretation: <1.31 ft/sec, indicative of household ambulator   General Gait Details: Min assist for RW control initial half of distance covered. Cues for larger step length and foot clearance. Minor drift, started to self correct. Cues for awareness. No overt buckling. Able to maintain grasp on RW throughout.  Stairs  Wheelchair Mobility     Tilt Bed    Modified Rankin (Stroke Patients Only) Modified Rankin (Stroke Patients Only) Pre-Morbid Rankin Score: Slight  disability Modified Rankin: Moderately severe disability     Balance Overall balance assessment: Needs assistance Sitting-balance support: No upper extremity supported, Feet supported Sitting balance-Leahy Scale: Fair     Standing balance support: Single extremity supported Standing balance-Leahy Scale: Poor                               Pertinent Vitals/Pain      Home Living Family/patient expects to be discharged to:: Private residence Living Arrangements: Spouse/significant other Available Help at Discharge: Family;Friend(s) Type of Home: House         Home Layout: One level Home Equipment: Agricultural consultant (2 wheels);Other (comment) (lift chair) Additional Comments: Wife unable to provide physical assistance    Prior Function Prior Level of Function : Patient poor historian/Family not available;Needs assist             Mobility Comments: uses RW for mobility ADLs Comments: sponge bathing for a few years per grandson, able to dress self. 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 RLE Sensation: decreased light touch (Rt distal LE)    Cervical / Trunk Assessment Cervical / Trunk Assessment: Normal  Communication   Communication: Expressive difficulties  Cognition Arousal/Alertness: Awake/alert Behavior During Therapy: WFL for tasks assessed/performed Overall Cognitive Status: No family/caregiver present to determine baseline cognitive functioning (Very short term memory impairement. Unaware of deficits. Oriented to self. Cannot recall  month/season, and year incorrect "2026". Oriented to situation several times during session but unable to recall when asked why he was hospitalized.)                                 General Comments: per grandson, pt with memory impairments (sometimes will forget grandson's name if he has not been by in a couple of days). pt A&Ox2 today, unable to report month until provided options (typically able to report this) and reports coming in due to pain - unable to report stroke symptoms. Follows directions with multimodal cues, assist for DME and safety        General Comments General comments (skin integrity, edema, etc.): BP 178/85 HR 72 SpO2 95% on RA.    Exercises     Assessment/Plan    PT Assessment Patient needs continued PT services  PT Problem List Decreased strength;Decreased activity tolerance;Decreased balance;Decreased mobility;Decreased coordination;Decreased cognition;Decreased knowledge of use of DME;Decreased safety awareness;Decreased knowledge of precautions;Impaired sensation       PT Treatment Interventions DME instruction;Gait training;Functional mobility training;Therapeutic activities;Therapeutic exercise;Stair training;Balance training;Neuromuscular re-education;Cognitive remediation;Patient/family education    PT Goals (Current goals can be found in the Care Plan section)  Acute Rehab PT Goals Patient Stated Goal: Go home PT Goal Formulation: Patient unable to participate in goal setting Time For Goal Achievement: 07/08/23 Potential to Achieve Goals: Good    Frequency Min 4X/week     Co-evaluation  AM-PAC PT "6 Clicks" Mobility  Outcome Measure Help needed turning from your back to your side while in a flat bed without using bedrails?: A Little Help needed moving from lying on your back to sitting on the side of a flat bed without using bedrails?: A Little Help needed moving to and from a bed to a chair (including a  wheelchair)?: A Lot Help needed standing up from a chair using your arms (e.g., wheelchair or bedside chair)?: A Lot Help needed to walk in hospital room?: A Little Help needed climbing 3-5 steps with a railing? : A Lot 6 Click Score: 15    End of Session Equipment Utilized During Treatment: Gait belt Activity Tolerance: Patient tolerated treatment well Patient left: in chair;with call bell/phone within reach;with chair alarm set Nurse Communication: Mobility status PT Visit Diagnosis: Unsteadiness on feet (R26.81);Other abnormalities of gait and mobility (R26.89);Muscle weakness (generalized) (M62.81);Difficulty in walking, not elsewhere classified (R26.2);Other symptoms and signs involving the nervous system (R29.898)    Time: 8657-8469 PT Time Calculation (min) (ACUTE ONLY): 16 min   Charges:   PT Evaluation $PT Eval Moderate Complexity: 1 Mod PT Treatments $Therapeutic Activity: 8-22 mins PT General Charges $$ ACUTE PT VISIT: 1 Visit         Kathlyn Sacramento, PT, DPT [facility]  Rehabilitation Services Physical Therapist Office: [phone] Website: [email or web address]   Berton Mount 06/24/2023, 11:16 AM

## 2023-06-24 NOTE — Plan of Care (Signed)
  Problem: Education: Goal: Knowledge of disease or condition will improve Outcome: Progressing Goal: Knowledge of secondary prevention will improve (MUST DOCUMENT ALL) Outcome: Progressing   Problem: Education: Goal: Knowledge of secondary prevention will improve (MUST DOCUMENT ALL) Outcome: Progressing

## 2023-06-24 NOTE — Plan of Care (Signed)
  Problem: Education: Goal: Knowledge of disease or condition will improve Outcome: Progressing Goal: Knowledge of secondary prevention will improve (MUST DOCUMENT ALL) Outcome: Progressing Goal: Knowledge of patient specific risk factors will improve (Mark N/A or DELETE if not current risk factor) Outcome: Progressing   Problem: Ischemic Stroke/TIA Tissue Perfusion: Goal: Complications of ischemic stroke/TIA will be minimized Outcome: Progressing   

## 2023-06-24 NOTE — Progress Notes (Addendum)
STROKE TEAM PROGRESS NOTE   BRIEF HPI Michael Kidd is a 89 y.o. male with a past medical history of stroke in January on Plavix(work up at [facility]), previous stroke in 202, BPH, CAD, neuropathy, MI, HLD, HTN, and aortic valve stenosis s/p TAVR in 2021  presenting with facial droop and slow responsiveness. Per EMS report patient took a nap in the living room with his wife and then woke up at 1530 and became confused. Upon talking with his wife it is found that he was difficult for her to wake him up this morning and he had been slow to respond this morning as well. He then had physical therapy, but she did not watch and ended up falling back asleep. She does not know what time she woke back up but she went to the living room where he was and they were watching TV and napping. He woke up around 1530 and could not distinguish between the TV and chair remote. He fell back asleep and then was very difficult to wake back up. He had garbled speech and his facial droop looked worse. She then called EMS. NH 6 on ED arrival to ED.  LKW: Unclear- 7/16 TNK given?: No  Premorbid modified Rankin scale (mRS): 4 4-Needs assistance to walk and tend to bodily needs   SIGNIFICANT HOSPITAL EVENTS 7/17: MRI positive for left internal capsule infarct.   INTERIM HISTORY/SUBJECTIVE No family at bedside.  Patient sitting up in chair. On exam, patient has right facial droop, decreased right grip, decreased right arm fine movements, drift seen in right arm and leg.   Recommending aspirin and Brilinta for 4 weeks then aspirin due to multiple strokes while on Plavix.   OBJECTIVE  CBC    Component Value Date/Time   WBC 6.9 06/23/2023 1648   RBC 4.22 06/23/2023 1648   HGB 13.1 06/23/2023 1648   HGB 14.4 08/15/2020 1421   HCT 39.7 06/23/2023 1648   HCT 42.0 08/15/2020 1421   PLT 210 06/23/2023 1648   PLT 224 08/15/2020 1421   MCV 94.1 06/23/2023 1648   MCV 89 08/15/2020 1421   MCH 31.0 06/23/2023 1648    MCHC 33.0 06/23/2023 1648   RDW 13.7 06/23/2023 1648   RDW 14.4 08/15/2020 1421   LYMPHSABS 2.4 06/23/2023 1648   MONOABS 0.7 06/23/2023 1648   EOSABS 0.2 06/23/2023 1648   BASOSABS 0.1 06/23/2023 1648    BMET    Component Value Date/Time   NA 142 06/23/2023 1648   NA 143 05/09/2021 1533   K 3.4 (L) 06/23/2023 1648   CL 105 06/23/2023 1648   CO2 24 06/23/2023 1648   GLUCOSE 136 (H) 06/23/2023 1648   BUN 16 06/23/2023 1648   BUN 16 05/09/2021 1533   CREATININE 1.01 06/23/2023 1648   CALCIUM 8.8 (L) 06/23/2023 1648   EGFR 71 05/09/2021 1533   GFRNONAA >60 06/23/2023 1648    IMAGING past 24 hours MR BRAIN WO CONTRAST  Result Date: 06/23/2023 CLINICAL DATA:  Acute neurologic deficit EXAM: MRI HEAD WITHOUT CONTRAST TECHNIQUE: Multiplanar, multiecho pulse sequences of the brain and surrounding structures were obtained without intravenous contrast. COMPARISON:  01/04/2023 FINDINGS: Brain: Small area of abnormal diffusion restriction of the left corpus striatum. No acute or chronic hemorrhage. There is multifocal hyperintense T2-weighted signal within the white matter. Generalized volume loss. Old right parietal and occipital infarcts. Old small vessel infarcts of the basal ganglia, cerebellum and corona radiata. The midline structures are normal. Vascular: Major flow  voids are preserved. Skull and upper cervical spine: Normal calvarium and skull base. Visualized upper cervical spine and soft tissues are normal. Sinuses/Orbits:Mild maxillary sinus mucosal thickening. Normal orbits. IMPRESSION: 1. Small area of acute ischemia of the left corpus striatum. No hemorrhage or mass effect. 2. Old right parietal and occipital infarcts. 3. Old small vessel infarcts of the basal ganglia, cerebellum and corona radiata. Electronically Signed   By: Deatra Robinson M.D.   On: 06/23/2023 20:25   CT ANGIO HEAD NECK W WO CM (CODE STROKE)  Result Date: 06/23/2023 CLINICAL DATA:  Neuro deficit, acute, stroke  suspected. Right-sided deficit. EXAM: CT ANGIOGRAPHY HEAD AND NECK WITH AND WITHOUT CONTRAST TECHNIQUE: Multidetector CT imaging of the head and neck was performed using the standard protocol during bolus administration of intravenous contrast. Multiplanar CT image reconstructions and MIPs were obtained to evaluate the vascular anatomy. Carotid stenosis measurements (when applicable) are obtained utilizing NASCET criteria, using the distal internal carotid diameter as the denominator. RADIATION DOSE REDUCTION: This exam was performed according to the departmental dose-optimization program which includes automated exposure control, adjustment of the mA and/or kV according to patient size and/or use of iterative reconstruction technique. CONTRAST:  75mL OMNIPAQUE IOHEXOL 350 MG/ML SOLN COMPARISON:  Head CT 06/23/2023. CTA head 01/04/2023. MRI brain 08/26/2019. FINDINGS: CTA NECK FINDINGS Aortic arch: Four vessel arch configuration with separate origin of the left vertebral artery. Atherosclerotic calcifications of the aortic arch and arch vessel origins. Arch vessel origins are patent. Right carotid system: No evidence of dissection, stenosis (50% or greater), or occlusion. Calcified plaque of the right carotid bulb. Tortuous distal cervical ICA. Left carotid system: No evidence of dissection, stenosis (50% or greater), or occlusion. Mixed plaque of the left carotid bulb. Mildly tortuous distal cervical ICA. Vertebral arteries: Codominant. No evidence of dissection, stenosis (50% or greater), or occlusion. Skeleton: Mild cervical spondylosis without high-grade spinal canal stenosis. Other neck: Unremarkable. Upper chest: Unremarkable. Review of the MIP images confirms the above findings CTA HEAD FINDINGS Anterior circulation: Calcified plaque along the carotid siphons without hemodynamically significant stenosis. The proximal ACAs and MCAs are patent without stenosis or aneurysm. Distal branches are symmetric.  Posterior circulation: Normal basilar artery. The SCAs, AICAs and PICAs are patent proximally. The PCAs are patent proximally without stenosis or aneurysm. Distal branches are symmetric. Venous sinuses: As permitted by contrast timing, patent. Anatomic variants: Persistent fetal origin of the left PCA with hypoplastic left P1 segment. Review of the MIP images confirms the above findings IMPRESSION: No large vessel occlusion, hemodynamically significant stenosis, or aneurysm in the head or neck. Aortic Atherosclerosis (ICD10-I70.0). Electronically Signed   By: Orvan Falconer M.D.   On: 06/23/2023 17:15   CT HEAD CODE STROKE WO CONTRAST  Result Date: 06/23/2023 CLINICAL DATA:  Code stroke. Neuro deficit, acute, stroke suspected. Slurred speech and right-sided weakness. EXAM: CT HEAD WITHOUT CONTRAST TECHNIQUE: Contiguous axial images were obtained from the base of the skull through the vertex without intravenous contrast. RADIATION DOSE REDUCTION: This exam was performed according to the departmental dose-optimization program which includes automated exposure control, adjustment of the mA and/or kV according to patient size and/or use of iterative reconstruction technique. COMPARISON:  Head CT 01/04/2023. FINDINGS: Brain: New asymmetric hypoattenuation in the posterior limb of the left internal capsule, suspicious for acute infarct. Old infarct in the left thalamus. Background of moderate chronic small-vessel disease. No hydrocephalus or extra-axial collection. No mass effect or midline shift. Vascular: No hyperdense vessel or unexpected calcification. Skull: No  calvarial fracture or suspicious bone lesion. Skull base is unremarkable. Sinuses/Orbits: Mild mucosal disease in the maxillary and frontal sinuses. Orbits are unremarkable. Other: None. ASPECTS (Alberta Stroke Program Early CT Score) - Ganglionic level infarction (caudate, lentiform nuclei, internal capsule, insula, M1-M3 cortex): 6 - Supraganglionic  infarction (M4-M6 cortex): 3 Total score (0-10 with 10 being normal): 9 IMPRESSION: 1. New asymmetric hypoattenuation in the posterior limb of the left internal capsule, suspicious for acute infarct. No hemorrhage or mass effect. 2. ASPECT score is 9. Code stroke imaging results were communicated on 06/23/2023 at 5:09 pm to provider Dr. Derry Lory via secure text paging. Electronically Signed   By: Orvan Falconer M.D.   On: 06/23/2023 17:09    Vitals:   06/23/23 2221 06/23/23 2345 06/24/23 0359 06/24/23 0617  BP: (!) 180/89 (!) 158/94 (!) 167/85   Pulse: (!) 58 (!) 57 74   Resp: 20 18 17    Temp: 97.8 F (36.6 C) 98.1 F (36.7 C) 97.8 F (36.6 C)   TempSrc: Oral Oral Oral   SpO2: 98% 93% (!) 89%   Weight:      Height:    5\' 11"  (1.803 m)     PHYSICAL EXAM General:  Alert, well-nourished, well-developed pleasant elderly male in no acute distress Psych:  Mood and affect appropriate for situation CV: Regular rate and rhythm on monitor Respiratory:  Regular, unlabored respirations on room air GI: Abdomen soft and nontender   NEURO:  Mental Status: AA&Ox3, patient is able to give clear and coherent history Speech/Language: speech is without dysarthria or aphasia.  Naming, repetition, fluency, and comprehension intact.  Cranial Nerves:  II: PERRL. Visual fields full.  III, IV, VI: EOMI. Eyelids elevate symmetrically.  V: Sensation is intact to light touch and symmetrical to face.  VII: Right facial droop VIII: hearing intact to voice. IX, X: Palate elevates symmetrically. Phonation is normal.  FA:[id number] shrug 5/5. XII: tongue is midline without fasciculations. Motor: Decreased strength to right side, decreased right grip.  Tone: is normal and bulk is normal Sensation- Intact to light touch bilaterally. Extinction absent to light touch to DSS.   Coordination: FTN intact bilaterally.  Decreased fine motor in right arm.  Drift present on right arm and leg.  Gait- deferred  NIHSS:   1a Level of Conscious.: 0 1b LOC Questions: 0 1c LOC Commands: 0 2 Best Gaze: 0 3 Visual: 0 4 Facial Palsy: 1 5a Motor Arm - left: 0 5b Motor Arm - Right:1 6a Motor Leg - Left: 0 6b Motor Leg - Right: 1 7 Limb Ataxia: 1 8 Sensory: 0 9 Best Language: 0 10 Dysarthria: 0 11 Extinct. and Inatten.: 0 TOTAL: 4   ASSESSMENT/PLAN  Acute Ischemic Infarct:  left internal capsule  Etiology: Small vessel disease  code Stroke CT head: New asymmetric hypoattenuation in the posterior left internal capsule.  ASPECTS 9.   CTA head & neck: No LVO  MRI: Acute ischemia of the left corpus stratum.  Old right parietal and occipital infarcts.  2D Echo: EF 60 to 65%, grade 1 diastolic dysfunction, mild to moderately dilated left atria, mild AVR  LDL 50 HgbA1c 5.8  VTE prophylaxis - lovenox clopidogrel 75 mg daily prior to admission, now on aspirin and brilinta for  4 weeks and then aspirin alone.  Therapy recommendations:  CIR Disposition:  pending  Hx of Stroke/TIA Acute left thalamic lacunar infarct January 2024. Placed on aspirin and plavix for 21 days, then plavix.  Small acute Right MCA  cortical infarcts in November 2020. Placed on aspirin and plavix for 21 days, then plavix. Loop recorder placed with no Afib detected.    Hypertension CAD Chronic ischemic heart disease S/p TAVR 2021 Home meds: Lisinopril 20 mg, held Blood Pressure Goal: BP less than 180/105, gradually back to normotensive in the next 24 hours. Avoid hypotension.   Hyperlipidemia Home meds: Lipitor 80 mg, resumed in hospital LDL 50, goal < 70 Continue statin at discharge  Diabetes type II, Pre-Diabetic Home meds:  none HgbA1c 5.8, goal < 7.0 CBGs SSI Recommend close follow-up with PCP   Tobacco Abuse Former Smoker   Other Active Problems Hx of Melanoma BLE neuropathy  Hospital day # 0   Pt seen by Neuro NP/APP and later by MD. Note/plan to be edited by MD as needed.    Lynnae January, DNP,  AGACNP-BC [facility] Please use AMION for contact information & EPIC for messaging.  STROKE MD NOTE :  I have personally obtained history,examined this patient, reviewed notes, independently viewed imaging studies, participated in medical decision making and plan of care.ROS completed by me personally and pertinent positives fully documented  I have made any additions or clarifications directly to the above note. Agree with note above.  Patient presented with dysarthria as an facial droop secondary to left internal capsule infarct likely from small vessel disease.  Recommend continue ongoing stroke workup.  Aspirin and Brilinta for 4 weeks followed by aspirin alone and aggressive risk factor modification.  Physical Occupational Therapy consults.  Long discussion with patient about his lacunar stroke and answered questions.  Discussed with Dr.Pokhrel.  Greater than 50% time during this 50-minute visit was spent in counseling and coordination of care about his lacunar stroke and discussion about evaluation and treatment and prevention and answering questions  Delia Heady, MD Medical Director [facility] Pager: [phone] 06/24/2023 3:01 PM  To contact Stroke Continuity provider, please refer to [email or web address]. After hours, contact General Neurology

## 2023-06-24 NOTE — Progress Notes (Signed)
PROGRESS NOTE    Michael Kidd:[id number] DOB: 16-Dec-1929 DOA: 06/23/2023 PCP: Barron Alvine, MD    Brief Narrative:  VELDON Kidd is a 89 y.o. male with medical history significant of hypertension, hyperlipidemia, GERD, stroke, depression, BPH, aortic stenosis status post TAVR, CAD status post stent presented to the hospital with not being completely himself with the facial droop right-sided weakness and aphasia.  In the ED patient had hypertension.  Labs showed hypokalemia.  CT head showed hypoattenuated area suspicious for infarct.  CTA of the head and neck without any large vessel occlusion.  Neurology was consulted and patient was admitted hospital for possible stroke.  Assessment/Plan   Acute CVA Patient presenting with right-sided sensory deficit and aphasia.  No history of previous stroke.  CT head with possibility of stroke.  Neurology on board.  Allow permissive hypertension.  MRI of the brain shows a small area of acute ischemia in the left corpus stratum.  Continue Daily aspirin and Plavix, statin, check 2D echocardiogram.  Hemoglobin A1c pending.  Lipid panel showing HDL of 31 with LDL of 50.  Seen by PT OT and recommended CIR.Marland Kitchen  Continue Lipitor.   Hypertension On lisinopril as outpatient.  Holding for now due to permissive hypertension.  Hyperlipidemia Continue Lipitor.   Depression - Not currently on duloxetine   Memory impairment Continue Namenda   History of CAD Continue aspirin Plavix and statins.  Lisinopril on hold.  Negative COVID   History of aortic stenosis status post TAVR Stable at this time.      DVT prophylaxis: SCD's Start: 06/23/23 1845   Code Status:     Code Status: Full Code  Disposition: CIR as per PT evaluation.  Status is: Observation  The patient will require care spanning > 2 midnights and should be moved to inpatient because: Acute stroke, need for rehabilitation.   Family Communication: None at bedside  Consultants:   Neurology  Procedures:  None  Antimicrobials:  none  Anti-infectives (From admission, onward)    None       Subjective: Today, patient was seen and examined at bedside.  Patient states that she is okay.  Weakness on the right side.  Oriented to place.  Speech distinct.  Denies any pain, nausea, vomiting, fever.  Objective: Vitals:   06/24/23 0359 06/24/23 0617 06/24/23 0811 06/24/23 1100  BP: (!) 167/85  (!) 156/84 (!) 185/91  Pulse: 74  80 69  Resp: 17  16 14   Temp: 97.8 F (36.6 C)  98 F (36.7 C) 98.2 F (36.8 C)  TempSrc: Oral  Oral Oral  SpO2: (!) 89%  98% 99%  Weight:      Height:  5\' 11"  (1.803 m)      Intake/Output Summary (Last 24 hours) at 06/24/2023 1237 Last data filed at 06/24/2023 0900 Gross per 24 hour  Intake 240 ml  Output 350 ml  Net -110 ml   Filed Weights   06/23/23 1600  Weight: 95 kg    Physical Examination: Body mass index is 29.21 kg/m.   General:  Average built, not in obvious distress, elderly male, Communicative HENT:   No scleral pallor or icterus noted. Oral mucosa is moist.  Chest:  Clear breath sounds.   No crackles or wheezes.  CVS: S1 &S2 heard. No murmur.  Regular rate and rhythm. Abdomen: Soft, nontender, nondistended.  Bowel sounds are heard.   Extremities: No cyanosis, clubbing or edema.  Psych: Alert, awake and oriented to self and  place, CNS: Right facial droop, right-sided weakness, oriented to self and place.  Speech is intact. Skin: Warm and dry.  No rashes noted.  Data Reviewed:   CBC: Recent Labs  Lab 06/23/23 1646 06/23/23 1648  WBC  --  6.9  NEUTROABS  --  3.6  HGB 12.6* 13.1  HCT 37.0* 39.7  MCV  --  94.1  PLT  --  210    Basic Metabolic Panel: Recent Labs  Lab 06/23/23 1646 06/23/23 1648  NA 143 142  K 3.5 3.4*  CL 106 105  CO2  --  24  GLUCOSE 132* 136*  BUN 17 16  CREATININE 1.00 1.01  CALCIUM  --  8.8*    Liver Function Tests: Recent Labs  Lab 06/23/23 1648  AST 30  ALT 26   ALKPHOS 89  BILITOT 0.6  PROT 6.0*  ALBUMIN 3.5     Radiology Studies: MR BRAIN WO CONTRAST  Result Date: 06/23/2023 CLINICAL DATA:  Acute neurologic deficit EXAM: MRI HEAD WITHOUT CONTRAST TECHNIQUE: Multiplanar, multiecho pulse sequences of the brain and surrounding structures were obtained without intravenous contrast. COMPARISON:  01/04/2023 FINDINGS: Brain: Small area of abnormal diffusion restriction of the left corpus striatum. No acute or chronic hemorrhage. There is multifocal hyperintense T2-weighted signal within the white matter. Generalized volume loss. Old right parietal and occipital infarcts. Old small vessel infarcts of the basal ganglia, cerebellum and corona radiata. The midline structures are normal. Vascular: Major flow voids are preserved. Skull and upper cervical spine: Normal calvarium and skull base. Visualized upper cervical spine and soft tissues are normal. Sinuses/Orbits:Mild maxillary sinus mucosal thickening. Normal orbits. IMPRESSION: 1. Small area of acute ischemia of the left corpus striatum. No hemorrhage or mass effect. 2. Old right parietal and occipital infarcts. 3. Old small vessel infarcts of the basal ganglia, cerebellum and corona radiata. Electronically Signed   By: Deatra Robinson M.D.   On: 06/23/2023 20:25   CT ANGIO HEAD NECK W WO CM (CODE STROKE)  Result Date: 06/23/2023 CLINICAL DATA:  Neuro deficit, acute, stroke suspected. Right-sided deficit. EXAM: CT ANGIOGRAPHY HEAD AND NECK WITH AND WITHOUT CONTRAST TECHNIQUE: Multidetector CT imaging of the head and neck was performed using the standard protocol during bolus administration of intravenous contrast. Multiplanar CT image reconstructions and MIPs were obtained to evaluate the vascular anatomy. Carotid stenosis measurements (when applicable) are obtained utilizing NASCET criteria, using the distal internal carotid diameter as the denominator. RADIATION DOSE REDUCTION: This exam was performed according  to the departmental dose-optimization program which includes automated exposure control, adjustment of the mA and/or kV according to patient size and/or use of iterative reconstruction technique. CONTRAST:  75mL OMNIPAQUE IOHEXOL 350 MG/ML SOLN COMPARISON:  Head CT 06/23/2023. CTA head 01/04/2023. MRI brain 08/26/2019. FINDINGS: CTA NECK FINDINGS Aortic arch: Four vessel arch configuration with separate origin of the left vertebral artery. Atherosclerotic calcifications of the aortic arch and arch vessel origins. Arch vessel origins are patent. Right carotid system: No evidence of dissection, stenosis (50% or greater), or occlusion. Calcified plaque of the right carotid bulb. Tortuous distal cervical ICA. Left carotid system: No evidence of dissection, stenosis (50% or greater), or occlusion. Mixed plaque of the left carotid bulb. Mildly tortuous distal cervical ICA. Vertebral arteries: Codominant. No evidence of dissection, stenosis (50% or greater), or occlusion. Skeleton: Mild cervical spondylosis without high-grade spinal canal stenosis. Other neck: Unremarkable. Upper chest: Unremarkable. Review of the MIP images confirms the above findings CTA HEAD FINDINGS Anterior circulation: Calcified plaque  along the carotid siphons without hemodynamically significant stenosis. The proximal ACAs and MCAs are patent without stenosis or aneurysm. Distal branches are symmetric. Posterior circulation: Normal basilar artery. The SCAs, AICAs and PICAs are patent proximally. The PCAs are patent proximally without stenosis or aneurysm. Distal branches are symmetric. Venous sinuses: As permitted by contrast timing, patent. Anatomic variants: Persistent fetal origin of the left PCA with hypoplastic left P1 segment. Review of the MIP images confirms the above findings IMPRESSION: No large vessel occlusion, hemodynamically significant stenosis, or aneurysm in the head or neck. Aortic Atherosclerosis (ICD10-I70.0). Electronically  Signed   By: Orvan Falconer M.D.   On: 06/23/2023 17:15   CT HEAD CODE STROKE WO CONTRAST  Result Date: 06/23/2023 CLINICAL DATA:  Code stroke. Neuro deficit, acute, stroke suspected. Slurred speech and right-sided weakness. EXAM: CT HEAD WITHOUT CONTRAST TECHNIQUE: Contiguous axial images were obtained from the base of the skull through the vertex without intravenous contrast. RADIATION DOSE REDUCTION: This exam was performed according to the departmental dose-optimization program which includes automated exposure control, adjustment of the mA and/or kV according to patient size and/or use of iterative reconstruction technique. COMPARISON:  Head CT 01/04/2023. FINDINGS: Brain: New asymmetric hypoattenuation in the posterior limb of the left internal capsule, suspicious for acute infarct. Old infarct in the left thalamus. Background of moderate chronic small-vessel disease. No hydrocephalus or extra-axial collection. No mass effect or midline shift. Vascular: No hyperdense vessel or unexpected calcification. Skull: No calvarial fracture or suspicious bone lesion. Skull base is unremarkable. Sinuses/Orbits: Mild mucosal disease in the maxillary and frontal sinuses. Orbits are unremarkable. Other: None. ASPECTS (Alberta Stroke Program Early CT Score) - Ganglionic level infarction (caudate, lentiform nuclei, internal capsule, insula, M1-M3 cortex): 6 - Supraganglionic infarction (M4-M6 cortex): 3 Total score (0-10 with 10 being normal): 9 IMPRESSION: 1. New asymmetric hypoattenuation in the posterior limb of the left internal capsule, suspicious for acute infarct. No hemorrhage or mass effect. 2. ASPECT score is 9. Code stroke imaging results were communicated on 06/23/2023 at 5:09 pm to provider Dr. Derry Lory via secure text paging. Electronically Signed   By: Orvan Falconer M.D.   On: 06/23/2023 17:09      LOS: 0 days     Joycelyn Das, MD [facility] Available via Epic secure chat  7am-7pm After these hours, please refer to coverage provider listed on [email or web address] 06/24/2023, 12:37 PM

## 2023-06-24 NOTE — Evaluation (Addendum)
Occupational Therapy Evaluation Patient Details Name: Michael Kidd MRN: [id number] DOB: 30-Jul-1930 Today's Date: 06/24/2023   History of Present Illness Pt is a 89 y/o male presenting with slurred speech, facial droop and R sided deficits. MRI brain showed small area of acute ischemia of L corpus striatum. PMH: HTN, HLD, GERD, CVA, depression, BPH, aortic stenosis status post TAVR, CAD s/p stenting.   Clinical Impression   PTA, pt lives with spouse, typically Modified Independent with ADLs/mobility using RW and has assist for IADLs. Pt presents now with deficits in cognition (A&Ox2; minor memory deficits at baseline), standing balance, strength and endurance. Pt requires Mod A to stand and Min A for mobility with RW in-room. Pt requires Min A for UB ADL and Mod A for LB ADLs. Per grandson via phone, wife until to provide physical assistance at DC. Patient may benefit from continued inpatient follow up therapy, > 3 hours/day if unable to progress to Supervision level by discharge.     Recommendations for follow up therapy are one component of a multi-disciplinary discharge planning process, led by the attending physician.  Recommendations may be updated based on patient status, additional functional criteria and insurance authorization.   Assistance Recommended at Discharge Frequent or constant Supervision/Assistance  Patient can return home with the following A little help with walking and/or transfers;A lot of help with bathing/dressing/bathroom;Direct supervision/assist for medications management;Direct supervision/assist for financial management    Functional Status Assessment  Patient has had a recent decline in their functional status and demonstrates the ability to make significant improvements in function in a reasonable and predictable amount of time.  Equipment Recommendations  BSC/3in1    Recommendations for Other Services Rehab consult     Precautions / Restrictions  Precautions Precautions: Fall Restrictions Weight Bearing Restrictions: No      Mobility Bed Mobility Overal bed mobility: Needs Assistance Bed Mobility: Supine to Sit     Supine to sit: Mod assist, HOB elevated     General bed mobility comments: handheld assist to lift trunk and scoot hips fully EOB    Transfers Overall transfer level: Needs assistance Equipment used: Rolling walker (2 wheels) Transfers: Sit to/from Stand Sit to Stand: Mod assist           General transfer comment: Mod A from bed with pt difficulty powering up and gaining balance      Balance Overall balance assessment: Needs assistance Sitting-balance support: No upper extremity supported, Feet supported Sitting balance-Leahy Scale: Fair     Standing balance support: Bilateral upper extremity supported, During functional activity Standing balance-Leahy Scale: Poor                             ADL either performed or assessed with clinical judgement   ADL Overall ADL's : Needs assistance/impaired Eating/Feeding: Set up   Grooming: Min guard;Standing;Oral care;Wash/dry face Grooming Details (indicate cue type and reason): some assist for balance with posterior balance when standing unsupported Upper Body Bathing: Minimal assistance;Sitting   Lower Body Bathing: Moderate assistance;Sit to/from stand   Upper Body Dressing : Minimal assistance;Sitting   Lower Body Dressing: Moderate assistance;Sit to/from stand   Toilet Transfer: Minimal assistance;Ambulation;Rolling walker (2 wheels)   Toileting- Clothing Manipulation and Hygiene: Moderate assistance;Sitting/lateral lean;Sit to/from stand       Functional mobility during ADLs: Minimal assistance;Rolling walker (2 wheels) General ADL Comments: Pt with limitations due to cognitive deficits, weakness and difficulty manuevering RW  Vision Baseline Vision/History: 0 No visual deficits (denies wearing glasses) Ability to See  in Adequate Light: 0 Adequate Patient Visual Report: No change from baseline Vision Assessment?: No apparent visual deficits Additional Comments: excessive blinking at times but pt denying issues     Perception     Praxis      Pertinent Vitals/Pain Pain Assessment Pain Assessment: No/denies pain     Hand Dominance Right   Extremity/Trunk Assessment Upper Extremity Assessment Upper Extremity Assessment: Generalized weakness   Lower Extremity Assessment Lower Extremity Assessment: Defer to PT evaluation   Cervical / Trunk Assessment Cervical / Trunk Assessment: Normal   Communication Communication Communication: Expressive difficulties   Cognition Arousal/Alertness: Awake/alert Behavior During Therapy: WFL for tasks assessed/performed Overall Cognitive Status: History of cognitive impairments - at baseline                                 General Comments: per grandson, pt with memory impairments (sometimes will forget grandson's name if he has not been by in a couple of days). pt A&Ox2 today, unable to report month until provided options (typically able to report this) and reports coming in due to pain - unable to report stroke symptoms. Follows directions with multimodal cues, assist for DME and safety     General Comments  Called grandson, Dannielle Huh, after session for PLOF info as unable to reach wife via phone    Exercises     Shoulder Instructions      Home Living Family/patient expects to be discharged to:: Private residence Living Arrangements: Spouse/significant other Available Help at Discharge: Family;Friend(s) Type of Home: House       Home Layout: One level     Bathroom Shower/Tub: Sponge bathes at baseline   Bathroom Toilet: Standard     Home Equipment: Agricultural consultant (2 wheels);Other (comment) (lift chair)   Additional Comments: Wife unable to provide physical assistance      Prior Functioning/Environment Prior Level of Function :  Patient poor historian/Family not available;Needs assist             Mobility Comments: uses RW for mobility ADLs Comments: sponge bathing for a few years per grandson, able to dress self. wife does IADLs, neither drive so friends assist with groceries        OT Problem List: Decreased strength;Decreased activity tolerance;Impaired balance (sitting and/or standing);Decreased cognition;Decreased safety awareness;Decreased knowledge of use of DME or AE      OT Treatment/Interventions: Self-care/ADL training;Therapeutic exercise;DME and/or AE instruction;Energy conservation;Therapeutic activities;Patient/family education;Balance training    OT Goals(Current goals can be found in the care plan section) Acute Rehab OT Goals Patient Stated Goal: pt would like to see wife and feel better OT Goal Formulation: With patient/family Time For Goal Achievement: 07/08/23 Potential to Achieve Goals: Good ADL Goals Pt Will Perform Lower Body Bathing: with supervision;sit to/from stand Pt Will Perform Lower Body Dressing: with supervision;sit to/from stand Pt Will Transfer to Toilet: with supervision;ambulating Additional ADL Goal #1: Pt to complete 3 step trail making task with no more than min verbal cues  OT Frequency: Min 2X/week    Co-evaluation              AM-PAC OT "6 Clicks" Daily Activity     Outcome Measure Help from another person eating meals?: A Little Help from another person taking care of personal grooming?: A Little Help from another person toileting, which includes using toliet, bedpan,  or urinal?: A Lot Help from another person bathing (including washing, rinsing, drying)?: A Lot Help from another person to put on and taking off regular upper body clothing?: A Little Help from another person to put on and taking off regular lower body clothing?: A Lot 6 Click Score: 15   End of Session Equipment Utilized During Treatment: Gait belt;Rolling walker (2 wheels) Nurse  Communication: Mobility status;Other (comment) (request for orderingn breakfast)  Activity Tolerance: Patient tolerated treatment well Patient left: in chair;with call bell/phone within reach;with chair alarm set  OT Visit Diagnosis: Other abnormalities of gait and mobility (R26.89);Muscle weakness (generalized) (M62.81);Other symptoms and signs involving cognitive function                Time: 9604-5409 OT Time Calculation (min): 28 min Charges:  OT General Charges $OT Visit: 1 Visit OT Evaluation $OT Eval Moderate Complexity: 1 Mod OT Treatments $Self Care/Home Management : 8-22 mins  Bradd Canary, OTR/L Acute Rehab Services Office: [phone]   Lorre Munroe 06/24/2023, 10:19 AM

## 2023-06-24 NOTE — TOC CM/SW Note (Signed)
Transition of Care [facility]) - Inpatient Brief Assessment   Patient Details  Name: Michael Kidd MRN: [id number] Date of Birth: 1930-06-29  [facility] CM/SW Contact:    Kermit Balo, RN Phone Number: 06/24/2023, 1:35 PM   Clinical Narrative: CIR to eval   Transition of Care Asessment: Insurance and Status: Insurance coverage has been reviewed Patient has primary care physician: Yes Home environment has been reviewed: home with spouse   Prior/Current Home Services: No current home services Social Determinants of Health Reivew: SDOH reviewed no interventions necessary Readmission risk has been reviewed: Yes Transition of care needs: transition of care needs identified, TOC will continue to follow

## 2023-06-25 DIAGNOSIS — I350 Nonrheumatic aortic (valve) stenosis: Secondary | ICD-10-CM | POA: Diagnosis not present

## 2023-06-25 DIAGNOSIS — I2510 Atherosclerotic heart disease of native coronary artery without angina pectoris: Secondary | ICD-10-CM | POA: Diagnosis not present

## 2023-06-25 DIAGNOSIS — I69359 Hemiplegia and hemiparesis following cerebral infarction affecting unspecified side: Secondary | ICD-10-CM | POA: Diagnosis not present

## 2023-06-25 DIAGNOSIS — N400 Enlarged prostate without lower urinary tract symptoms: Secondary | ICD-10-CM | POA: Diagnosis not present

## 2023-06-25 DIAGNOSIS — I639 Cerebral infarction, unspecified: Secondary | ICD-10-CM | POA: Diagnosis not present

## 2023-06-25 LAB — BASIC METABOLIC PANEL
Anion gap: 10 (ref 5–15)
BUN: 14 mg/dL (ref 8–23)
CO2: 24 mmol/L (ref 22–32)
Calcium: 8.3 mg/dL — ABNORMAL LOW (ref 8.9–10.3)
Chloride: 105 mmol/L (ref 98–111)
Creatinine, Ser: 1.03 mg/dL (ref 0.61–1.24)
GFR, Estimated: >60 mL/min (ref >60)
Glucose, Bld: 116 mg/dL — ABNORMAL HIGH (ref 70–99)
Potassium: 3.1 mmol/L — ABNORMAL LOW (ref 3.5–5.1)
Sodium: 139 mmol/L (ref 135–145)

## 2023-06-25 LAB — GLUCOSE, CAPILLARY: Glucose-Capillary: 134 mg/dL — ABNORMAL HIGH (ref 70–99)

## 2023-06-25 LAB — CBC
HCT: 40 % (ref 39.0–52.0)
Hemoglobin: 13.8 g/dL (ref 13.0–17.0)
MCH: 31.8 pg (ref 26.0–34.0)
MCHC: 34.5 g/dL (ref 30.0–36.0)
MCV: 92.2 fL (ref 80.0–100.0)
Platelets: 212 10*3/uL (ref 150–400)
RBC: 4.34 MIL/uL (ref 4.22–5.81)
RDW: 13.6 % (ref 11.5–15.5)
WBC: 7.1 10*3/uL (ref 4.0–10.5)
nRBC: 0 % (ref 0.0–0.2)

## 2023-06-25 LAB — HEMOGLOBIN A1C
Hgb A1c MFr Bld: 5.7 % — ABNORMAL HIGH (ref 4.8–5.6)
Mean Plasma Glucose: 117 mg/dL

## 2023-06-25 LAB — MAGNESIUM: Magnesium: 2.2 mg/dL (ref 1.7–2.4)

## 2023-06-25 MED ORDER — LISINOPRIL 20 MG PO TABS
ORAL_TABLET | ORAL | Status: AC
  Filled 2023-06-25: qty 1

## 2023-06-25 MED ORDER — LISINOPRIL 20 MG PO TABS
20.0000 mg | ORAL_TABLET | Freq: Every day | ORAL | Status: DC
  Administered 2023-06-25 – 2023-07-01 (×7): 20 mg via ORAL
  Filled 2023-06-25 (×6): qty 1

## 2023-06-25 NOTE — Progress Notes (Signed)
Inpatient Rehab Coordinator Note:  I met with patient at bedside to discuss CIR recommendations and goals/expectations of CIR stay.  We reviewed 3 hrs/day of therapy, physician follow up, and average length of stay 2 weeks (dependent upon progress) with goals of supervision.  He states he lives with his wife and she is there all the time.  I was able to speak to [place] on the phone and she confirms that she is home 24/7 and can provide supervision only.  Reviewed need for insurance approval for CIR and I will start that auth request today.  We will follow.   Estill Dooms, PT, DPT Admissions Coordinator [phone] 06/25/23  1:15 PM

## 2023-06-25 NOTE — Progress Notes (Signed)
Physical Therapy Treatment Patient Details Name: Michael Kidd MRN: [id number] DOB: 08-02-1930 Today's Date: 06/25/2023   History of Present Illness Pt is a 89 y/o male presenting 7/17 with slurred speech, facial droop and R sided deficits. MRI brain showed small area of acute ischemia of L corpus striatum. PMH: HTN, HLD, GERD, CVA, depression, BPH, aortic stenosis status post TAVR, CAD s/p stenting.    PT Comments  No appreciable change in cognition however pt more alert today. Demonstrates spontaneous and brief (~15 sec) periods of dyspnea; SpO2 100% on RA with HR in 90s and no change during spells. Mod assist for boost and balance to rise from low bed setting. Min assist for gait today with RW, and while assessing with hand held support showing increased instability without device. Deficits in RLE more revealing without bil UE support. Patient will benefit from intensive inpatient follow up therapy, >3 hours/day. Patient will continue to benefit from skilled physical therapy services to further improve independence with functional mobility.      Assistance Recommended at Discharge Frequent or constant Supervision/Assistance  If plan is discharge home, recommend the following:  Can travel by private vehicle    A lot of help with walking and/or transfers;A lot of help with bathing/dressing/bathroom;Assistance with cooking/housework;Direct supervision/assist for medications management;Direct supervision/assist for financial management;Assist for transportation;Help with stairs or ramp for entrance      Equipment Recommendations  None recommended by PT (TBD next venue)    Recommendations for Other Services       Precautions / Restrictions Precautions Precautions: Fall Restrictions Weight Bearing Restrictions: No     Mobility  Bed Mobility               General bed mobility comments: Sitting EOB when PT entered room, speaking with Admissions Coordinator     Transfers Overall transfer level: Needs assistance Equipment used: Rolling walker (2 wheels), 1 person hand held assist Transfers: Sit to/from Stand Sit to Stand: Mod assist           General transfer comment: Mod assist for boost and balance to rise from low bed setting. Cues for hand placement, facilitating anterior weight shift. Pt using back of knees heavily in bed to brace. Reaches for furntiure despite cues for technique.    Ambulation/Gait Ambulation/Gait assistance: Min assist Gait Distance (Feet): 65 Feet Assistive device: Rolling walker (2 wheels), 1 person hand held assist Gait Pattern/deviations: Step-through pattern, Decreased stride length, Decreased weight shift to right, Shuffle, Drifts right/left, Trunk flexed Gait velocity: decr Gait velocity interpretation: <1.31 ft/sec, indicative of household ambulator   General Gait Details: Responds well to cues for larger steps today, initially shuffling with RW. Still flexed at trunk despite cues. Min assist intermittently for RW control. Without AD pt requires additional assist for balance, with more notable difficulty WB on Rt showing minor buckling but maintains mobile with min assist, hand held support, reaching for furniture in room when assessing.   Stairs             Wheelchair Mobility     Tilt Bed    Modified Rankin (Stroke Patients Only) Modified Rankin (Stroke Patients Only) Pre-Morbid Rankin Score: Slight disability Modified Rankin: Moderately severe disability     Balance Overall balance assessment: Needs assistance Sitting-balance support: No upper extremity supported, Feet supported Sitting balance-Leahy Scale: Fair     Standing balance support: Single extremity supported Standing balance-Leahy Scale: Poor  Cognition Arousal/Alertness: Awake/alert Behavior During Therapy: WFL for tasks assessed/performed Overall Cognitive Status: No  family/caregiver present to determine baseline cognitive functioning (Very short term memory impairement. Unaware of deficits. Oriented to self and location. Cannot recall month/season, and year incorrect "2026" (same answer as yesterday.))                                 General Comments: (from prior note: per grandson, pt with memory impairments (sometimes will forget grandson's name if he has not been by in a couple of days). pt A&Ox2 today, unable to report month until provided options (typically able to report this) and reports coming in due to pain - unable to report stroke symptoms. Follows directions with multimodal cues, assist for DME and safety)        Exercises General Exercises - Lower Extremity Long Arc Quad: Strengthening, Both, 15 reps, Seated    General Comments General comments (skin integrity, edema, etc.): Spontaneous becomes dyspneic for approx 15 seconds then resolves, even at rest. SpO2 100% on RA, HR unchanged in 90s during session.      Pertinent Vitals/Pain Pain Assessment Pain Assessment: No/denies pain    Home Living                          Prior Function            PT Goals (current goals can now be found in the care plan section) Acute Rehab PT Goals Patient Stated Goal: Go home PT Goal Formulation: Patient unable to participate in goal setting Time For Goal Achievement: 07/08/23 Potential to Achieve Goals: Good Progress towards PT goals: Progressing toward goals    Frequency    Min 4X/week      PT Plan Current plan remains appropriate    Co-evaluation              AM-PAC PT "6 Clicks" Mobility   Outcome Measure  Help needed turning from your back to your side while in a flat bed without using bedrails?: A Little Help needed moving from lying on your back to sitting on the side of a flat bed without using bedrails?: A Little Help needed moving to and from a bed to a chair (including a wheelchair)?: A  Lot Help needed standing up from a chair using your arms (e.g., wheelchair or bedside chair)?: A Lot Help needed to walk in hospital room?: A Little Help needed climbing 3-5 steps with a railing? : A Lot 6 Click Score: 15    End of Session Equipment Utilized During Treatment: Gait belt Activity Tolerance: Patient tolerated treatment well Patient left: in chair;with call bell/phone within reach;with chair alarm set Nurse Communication: Mobility status PT Visit Diagnosis: Unsteadiness on feet (R26.81);Other abnormalities of gait and mobility (R26.89);Muscle weakness (generalized) (M62.81);Difficulty in walking, not elsewhere classified (R26.2);Other symptoms and signs involving the nervous system (R29.898)     Time: 1130-1144 PT Time Calculation (min) (ACUTE ONLY): 14 min  Charges:    $Gait Training: 8-22 mins PT General Charges $$ ACUTE PT VISIT: 1 Visit                     Kathlyn Sacramento, PT, DPT [facility]  Rehabilitation Services Physical Therapist Office: [phone] Website: [email or web address]    Berton Mount 06/25/2023, 12:41 PM

## 2023-06-25 NOTE — Progress Notes (Signed)
PROGRESS NOTE    Michael Kidd  IRJ:[id number] DOB: 1930-04-22 DOA: 06/23/2023 PCP: Barron Alvine, MD    Brief Narrative:   Michael Kidd is a 89 y.o. male with medical history significant of hypertension, hyperlipidemia, GERD, stroke, depression, BPH, aortic stenosis status post TAVR, CAD status post stent presented to the hospital with not being completely himself with the facial droop right-sided weakness and aphasia.  In the ED, patient had hypertension.  Labs showed hypokalemia.  CT head showed hypoattenuated area suspicious for infarct.  CTA of the head and neck without any large vessel occlusion.  Neurology was consulted and patient was admitted hospital for possible stroke.  Assessment/Plan   Acute CVA Patient presenting with right-sided sensory deficit and aphasia.  No history of previous stroke.  CT head with possibility of stroke.  Neurology on board.   MRI of the brain shows a small area of acute ischemia in the left corpus stratum.  Continue Daily aspirin and Plavix, statin, 2D echocardiogram with LVEF of 60-65 percent, grade I diastolic dysfunction..  Hemoglobin A1c of 5.7.  Lipid panel showing HDL of 31 with LDL of 50.  Seen by PT OT and recommended CIR.Marland Kitchen  Continue Lipitor.   Hypertension On lisinopril as outpatient.  Resumed.  Hyperlipidemia Continue Lipitor.   Depression - Not currently on duloxetine   Memory impairment Continue Namenda   History of CAD Continue aspirin Plavix and statins.  Lisinopril on hold.  Negative COVID   History of aortic stenosis status post TAVR Stable at this time.      DVT prophylaxis: enoxaparin (LOVENOX) injection 40 mg Start: 06/24/23 1445   Code Status:     Code Status: Full Code  Disposition: CIR as per PT evaluation.  Status is: inpatient  The patient is inpatient because: Acute stroke, need for rehabilitation.   Family Communication:  unable to reach the spouse, tried to call daughter without success.  Consultants:   Neurology  Procedures:  None  Antimicrobials:  none  Anti-infectives (From admission, onward)    None       Subjective: Today, patient was seen and examined at bedside.  Weakness on the right side.   Denies any dizziness, headache, nausea, vomiting.   Objective: Vitals:   06/24/23 1932 06/24/23 2310 06/25/23 0305 06/25/23 0757  BP: (!) 170/92 (!) 184/92 (!) 158/82 (!) 171/106  Pulse: 73 75 72 63  Resp: 18 16 18 18   Temp: 97.7 F (36.5 C) 98.7 F (37.1 C) 98.3 F (36.8 C) 97.9 F (36.6 C)  TempSrc: Oral Axillary Oral Oral  SpO2: 92% 92% 92% 95%  Weight:      Height:        Intake/Output Summary (Last 24 hours) at 06/25/2023 1354 Last data filed at 06/24/2023 1854 Gross per 24 hour  Intake --  Output 400 ml  Net -400 ml   Filed Weights   06/23/23 1600  Weight: 95 kg    Physical Examination: Body mass index is 29.21 kg/m.   General:  Average built, not in obvious distress, elderly male, Communicative HENT:   No scleral pallor or icterus noted. Oral mucosa is moist.  Chest:  Clear breath sounds.   No crackles or wheezes.  CVS: S1 &S2 heard. No murmur.  Regular rate and rhythm. Abdomen: Soft, nontender, nondistended.  Bowel sounds are heard.   Extremities: No cyanosis, clubbing or edema.   Psych: Alert, awake and oriented to self and place, CNS: Right facial droop, right-sided weakness, oriented to  self and place.  Speech is intact. Skin: Warm and dry.  No rashes noted.  Data Reviewed:   CBC: Recent Labs  Lab 06/23/23 1646 06/23/23 1648 06/25/23 0312  WBC  --  6.9 7.1  NEUTROABS  --  3.6  --   HGB 12.6* 13.1 13.8  HCT 37.0* 39.7 40.0  MCV  --  94.1 92.2  PLT  --  210 212    Basic Metabolic Panel: Recent Labs  Lab 06/23/23 1646 06/23/23 1648  NA 143 142  K 3.5 3.4*  CL 106 105  CO2  --  24  GLUCOSE 132* 136*  BUN 17 16  CREATININE 1.00 1.01  CALCIUM  --  8.8*    Liver Function Tests: Recent Labs  Lab 06/23/23 1648  AST 30  ALT  26  ALKPHOS 89  BILITOT 0.6  PROT 6.0*  ALBUMIN 3.5     Radiology Studies: ECHOCARDIOGRAM COMPLETE  Result Date: 06/24/2023    ECHOCARDIOGRAM REPORT   Patient Name:   Michael Kidd Date of Exam: 06/24/2023 Medical Rec #:  [id number]     Height:       71.0 in Accession #:    [id number]    Weight:       209.4 lb Date of Birth:  [birth year]    BSA:          2.150 m Patient Age:    89 years      BP:           156/84 mmHg Patient Gender: M             HR:           71 bpm. Exam Location:  Inpatient Procedure: 2D Echo, Color Doppler and Cardiac Doppler Indications:    Stroke  History:        Patient has prior history of Echocardiogram examinations, most                 recent 01/04/2023. CAD, Aortic Valve Disease and s/p TAVR w/                 on 09/24/20; Risk Factors:Dyslipidemia and                 Hypertension.  Sonographer:    Milbert Coulter Referring Phys: [id number] Cecille Po MELVIN IMPRESSIONS  1. Left ventricular ejection fraction, by estimation, is 60 to 65%. The left ventricle has normal function. The left ventricle has no regional wall motion abnormalities. There is mild concentric left ventricular hypertrophy. Left ventricular diastolic parameters are consistent with Grade I diastolic dysfunction (impaired relaxation).  2. Right ventricular systolic function is normal. The right ventricular size is normal.  3. Left atrial size was mild to moderately dilated.  4. The mitral valve is normal in structure. No evidence of mitral valve regurgitation. No evidence of mitral stenosis.  5. The aortic valve has been repaired/replaced. Aortic valve regurgitation is mild. No aortic stenosis is present. Echo findings are consistent with normal structure and function of the aortic valve prosthesis. Aortic valve mean gradient measures 9.0 mmHg. Aortic valve Vmax measures 2.10 m/s. FINDINGS  Left Ventricle: Left ventricular ejection fraction, by estimation, is 60 to 65%. The left ventricle has normal  function. The left ventricle has no regional wall motion abnormalities. The left ventricular internal cavity size was normal in size. There is  mild concentric left ventricular hypertrophy. Left ventricular diastolic parameters are consistent with Grade I diastolic dysfunction (impaired  relaxation). Right Ventricle: The right ventricular size is normal. No increase in right ventricular wall thickness. Right ventricular systolic function is normal. Left Atrium: Left atrial size was mild to moderately dilated. Right Atrium: Right atrial size was normal in size. Pericardium: There is no evidence of pericardial effusion. Mitral Valve: The mitral valve is normal in structure. No evidence of mitral valve regurgitation. No evidence of mitral valve stenosis. Tricuspid Valve: The tricuspid valve is normal in structure. Tricuspid valve regurgitation is mild . No evidence of tricuspid stenosis. Aortic Valve: The aortic valve has been repaired/replaced. Aortic valve regurgitation is mild. Aortic regurgitation PHT measures 611 msec. No aortic stenosis is present. Aortic valve mean gradient measures 9.0 mmHg. Aortic valve peak gradient measures 17.6 mmHg. There is a 26 mm Sapien prosthetic, stented (TAVR) valve present in the aortic position. Echo findings are consistent with normal structure and function of the aortic valve prosthesis. Pulmonic Valve: The pulmonic valve was normal in structure. Pulmonic valve regurgitation is trivial. No evidence of pulmonic stenosis. Aorta: The aortic root is normal in size and structure. Venous: The inferior vena cava was not well visualized. IAS/Shunts: No atrial level shunt detected by color flow Doppler.  LEFT VENTRICLE PLAX 2D LVIDd:         3.50 cm Diastology LVIDs:         2.80 cm LV e' medial:    5.55 cm/s LV PW:         1.40 cm LV E/e' medial:  8.0 LV IVS:        1.40 cm LV e' lateral:   7.18 cm/s                        LV E/e' lateral: 6.2  RIGHT VENTRICLE RV S prime:     11.60 cm/s  TAPSE (M-mode): 1.7 cm LEFT ATRIUM             Index        RIGHT ATRIUM           Index LA Vol (A2C):   58.4 ml 27.16 ml/m  RA Area:     17.30 cm LA Vol (A4C):   52.2 ml 24.28 ml/m  RA Volume:   45.20 ml  21.02 ml/m LA Biplane Vol: 56.9 ml 26.46 ml/m  AORTIC VALVE AV Vmax:           210.00 cm/s AV Vmean:          139.000 cm/s AV VTI:            0.407 m AV Peak Grad:      17.6 mmHg AV Mean Grad:      9.0 mmHg LVOT Vmax:         93.80 cm/s LVOT Vmean:        61.300 cm/s LVOT VTI:          0.202 m LVOT/AV VTI ratio: 0.50 AI PHT:            611 msec  AORTA Ao Asc diam: 3.30 cm MITRAL VALVE               TRICUSPID VALVE MV Area (PHT): 3.53 cm    TR Peak grad:   22.8 mmHg MV Decel Time: 215 msec    TR Vmax:        239.00 cm/s MV E velocity: 44.50 cm/s MV A velocity: 99.60 cm/s  SHUNTS MV E/A ratio:  0.45  Systemic VTI: 0.20 m Arvilla Meres MD Electronically signed by Arvilla Meres MD Signature Date/Time: 06/24/2023/1:04:46 PM    Final    MR BRAIN WO CONTRAST  Result Date: 06/23/2023 CLINICAL DATA:  Acute neurologic deficit EXAM: MRI HEAD WITHOUT CONTRAST TECHNIQUE: Multiplanar, multiecho pulse sequences of the brain and surrounding structures were obtained without intravenous contrast. COMPARISON:  01/04/2023 FINDINGS: Brain: Small area of abnormal diffusion restriction of the left corpus striatum. No acute or chronic hemorrhage. There is multifocal hyperintense T2-weighted signal within the white matter. Generalized volume loss. Old right parietal and occipital infarcts. Old small vessel infarcts of the basal ganglia, cerebellum and corona radiata. The midline structures are normal. Vascular: Major flow voids are preserved. Skull and upper cervical spine: Normal calvarium and skull base. Visualized upper cervical spine and soft tissues are normal. Sinuses/Orbits:Mild maxillary sinus mucosal thickening. Normal orbits. IMPRESSION: 1. Small area of acute ischemia of the left corpus striatum. No  hemorrhage or mass effect. 2. Old right parietal and occipital infarcts. 3. Old small vessel infarcts of the basal ganglia, cerebellum and corona radiata. Electronically Signed   By: Deatra Robinson M.D.   On: 06/23/2023 20:25   CT ANGIO HEAD NECK W WO CM (CODE STROKE)  Result Date: 06/23/2023 CLINICAL DATA:  Neuro deficit, acute, stroke suspected. Right-sided deficit. EXAM: CT ANGIOGRAPHY HEAD AND NECK WITH AND WITHOUT CONTRAST TECHNIQUE: Multidetector CT imaging of the head and neck was performed using the standard protocol during bolus administration of intravenous contrast. Multiplanar CT image reconstructions and MIPs were obtained to evaluate the vascular anatomy. Carotid stenosis measurements (when applicable) are obtained utilizing NASCET criteria, using the distal internal carotid diameter as the denominator. RADIATION DOSE REDUCTION: This exam was performed according to the departmental dose-optimization program which includes automated exposure control, adjustment of the mA and/or kV according to patient size and/or use of iterative reconstruction technique. CONTRAST:  75mL OMNIPAQUE IOHEXOL 350 MG/ML SOLN COMPARISON:  Head CT 06/23/2023. CTA head 01/04/2023. MRI brain 08/26/2019. FINDINGS: CTA NECK FINDINGS Aortic arch: Four vessel arch configuration with separate origin of the left vertebral artery. Atherosclerotic calcifications of the aortic arch and arch vessel origins. Arch vessel origins are patent. Right carotid system: No evidence of dissection, stenosis (50% or greater), or occlusion. Calcified plaque of the right carotid bulb. Tortuous distal cervical ICA. Left carotid system: No evidence of dissection, stenosis (50% or greater), or occlusion. Mixed plaque of the left carotid bulb. Mildly tortuous distal cervical ICA. Vertebral arteries: Codominant. No evidence of dissection, stenosis (50% or greater), or occlusion. Skeleton: Mild cervical spondylosis without high-grade spinal canal stenosis.  Other neck: Unremarkable. Upper chest: Unremarkable. Review of the MIP images confirms the above findings CTA HEAD FINDINGS Anterior circulation: Calcified plaque along the carotid siphons without hemodynamically significant stenosis. The proximal ACAs and MCAs are patent without stenosis or aneurysm. Distal branches are symmetric. Posterior circulation: Normal basilar artery. The SCAs, AICAs and PICAs are patent proximally. The PCAs are patent proximally without stenosis or aneurysm. Distal branches are symmetric. Venous sinuses: As permitted by contrast timing, patent. Anatomic variants: Persistent fetal origin of the left PCA with hypoplastic left P1 segment. Review of the MIP images confirms the above findings IMPRESSION: No large vessel occlusion, hemodynamically significant stenosis, or aneurysm in the head or neck. Aortic Atherosclerosis (ICD10-I70.0). Electronically Signed   By: Orvan Falconer M.D.   On: 06/23/2023 17:15   CT HEAD CODE STROKE WO CONTRAST  Result Date: 06/23/2023 CLINICAL DATA:  Code stroke. Neuro deficit, acute,  stroke suspected. Slurred speech and right-sided weakness. EXAM: CT HEAD WITHOUT CONTRAST TECHNIQUE: Contiguous axial images were obtained from the base of the skull through the vertex without intravenous contrast. RADIATION DOSE REDUCTION: This exam was performed according to the departmental dose-optimization program which includes automated exposure control, adjustment of the mA and/or kV according to patient size and/or use of iterative reconstruction technique. COMPARISON:  Head CT 01/04/2023. FINDINGS: Brain: New asymmetric hypoattenuation in the posterior limb of the left internal capsule, suspicious for acute infarct. Old infarct in the left thalamus. Background of moderate chronic small-vessel disease. No hydrocephalus or extra-axial collection. No mass effect or midline shift. Vascular: No hyperdense vessel or unexpected calcification. Skull: No calvarial fracture or  suspicious bone lesion. Skull base is unremarkable. Sinuses/Orbits: Mild mucosal disease in the maxillary and frontal sinuses. Orbits are unremarkable. Other: None. ASPECTS ([place] Stroke Program Early CT Score) - Ganglionic level infarction (caudate, lentiform nuclei, internal capsule, insula, M1-M3 cortex): 6 - Supraganglionic infarction (M4-M6 cortex): 3 Total score (0-10 with 10 being normal): 9 IMPRESSION: 1. New asymmetric hypoattenuation in the posterior limb of the left internal capsule, suspicious for acute infarct. No hemorrhage or mass effect. 2. ASPECT score is 9. Code stroke imaging results were communicated on 06/23/2023 at 5:09 pm to provider Dr. Derry Lory via secure text paging. Electronically Signed   By: Orvan Falconer M.D.   On: 06/23/2023 17:09      LOS: 1 day     Joycelyn Das, MD [facility] Available via Epic secure chat 7am-7pm After these hours, please refer to coverage provider listed on [email or web address] 06/25/2023, 1:54 PM

## 2023-06-25 NOTE — Consult Note (Addendum)
Physical Medicine and Rehabilitation Consult Reason for Consult:right sided weakness, impaired functional mobility Referring Physician: Pokhrel     HPI: Michael Kidd is a 89 y.o. male 89 yo male with hx of HTN, prior CVA, AS/RAVR, CAD who presented on 06/23/23 with right sided weakness and altered mental status. CT demonstrated low attenuation area suspicious for infarct. CTA without LVO. MRI ultimately demonstrated small area of acute infarct in the left corpus callosum/internal capsule. Neurology recommends asa and brilinta for 4 weeks then asa alone.  Pt was up with PT yesterday and was mod assist sit-std transfers and walked 32' min assist with RW  shuffling, slow gait pattern. Pt lives at home with spouse who cannot provide physical assistance. Pt used RW PTA and able to perform basic ADL's on his own.   Review of Systems  Unable to perform ROS: Mental acuity   Past Medical History:  Diagnosis Date   Allergic rhinitis 03/29/2014   Aortic valve stenosis 05/23/2020   Arthralgia 07/15/2016   Arthritis    Benign prostatic hyperplasia 02/14/2014   Benign prostatic hyperplasia with urinary frequency 11/02/2018   Bilateral chronic knee pain    BPH (benign prostatic hyperplasia)    CAD (coronary artery disease)    s/p remote LAD PCI   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 PHQ-9 completed 02/26/14 PHQ-2 completed 02/14/14   Dizziness 03/12/2014   Dupuytren's contracture 07/15/2016   Dyspnea on exertion 08/15/2020   Dysthymia    Essential hypertension 03/12/2014   Flexor tendon bowstring 01/06/2016   Gastroesophageal reflux disease 08/18/2016   Hepatic steatosis 03/02/2014   History of CVA (cerebrovascular accident) 10/12/2019   HLD (hyperlipidemia) 02/14/2014   HTN (hypertension)    Late  effect of cerebrovascular accident (CVA) 10/19/2019   Melanoma in situ of right upper extremity including shoulder (HCC) 08/07/2016   Formatting of this note might be different from the original. August 2017.  Dr. Chancy Milroy, [facility].   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   Short of breath on exertion    per patient   Stroke [facility])    November   Symptomatic severe aortic stenosis with normal ejection fraction 08/15/2020   Past Surgical History:  Procedure Laterality Date   CATARACT EXTRACTION W/ INTRAOCULAR LENS  IMPLANT, BILATERAL     cataract repair     HERNIA REPAIR     unsure of date   LOOP RECORDER INSERTION N/A 08/28/2019   Procedure: LOOP RECORDER INSERTION;  Surgeon: Marinus Maw, MD;  Location: [facility] INVASIVE CV LAB;  Service: Cardiovascular;  Laterality: N/A;   PERCUTANEOUS CORONARY STENT INTERVENTION (PCI-S)     RIGHT/LEFT HEART CATH AND CORONARY ANGIOGRAPHY N/A 08/22/2020   Procedure: RIGHT/LEFT HEART CATH AND CORONARY ANGIOGRAPHY;  Surgeon: Marykay Lex, MD;  Location: [facility] INVASIVE CV LAB;  Service: Cardiovascular;  Laterality: N/A;   TEE WITHOUT CARDIOVERSION N/A 09/24/2020   Procedure: TRANSESOPHAGEAL ECHOCARDIOGRAM (TEE);  Surgeon: Tonny Bollman, MD;  Location: [facility] OR;  Service: Open Heart  Surgery;  Laterality: N/A;   TRANSCATHETER AORTIC VALVE REPLACEMENT, TRANSFEMORAL N/A 09/24/2020   Procedure: TRANSCATHETER AORTIC VALVE REPLACEMENT, TRANSFEMORAL - USING EDWARDS SAPIEN 3 ULTRA  VALVE SIZE ;  Surgeon: Tonny Bollman, MD;  Location: [facility] OR;  Service: Open Heart Surgery;  Laterality: N/A;   Family History  Problem Relation Age of Onset   Diabetes Mother    Stroke  Father    Social History:  reports that he has quit smoking. He has never used smokeless tobacco. He reports that he does not currently use alcohol. He reports that he does not use drugs. Allergies: No Known Allergies Medications Prior to Admission  Medication Sig Dispense Refill   acetaminophen (TYLENOL) 650 MG CR tablet Take 325 mg by mouth every 8 (eight) hours as needed for pain.     albuterol (VENTOLIN HFA) 108 (90 Base) MCG/ACT inhaler Inhale 1 puff into the lungs every 6 (six) hours as needed for shortness of breath or wheezing.     atorvastatin (LIPITOR) 80 MG tablet Take 1 tablet by mouth every evening.     clopidogrel (PLAVIX) 75 MG tablet Take 1 tablet by mouth once daily 90 tablet 0   Cyanocobalamin (B-12 PO) Take 1 tablet by mouth every evening.     finasteride (PROSCAR) 5 MG tablet Take 5 mg by mouth every evening.     lisinopril (ZESTRIL) 20 MG tablet Take 1 tablet by mouth daily.     memantine (NAMENDA) 10 MG tablet Take 1 tablet (10 mg total) by mouth 2 (two) times daily. 60 tablet 3   donepezil (ARICEPT) 5 MG tablet Take 5 mg by mouth at bedtime. (Patient not taking: Reported on 06/24/2023)     venlafaxine XR (EFFEXOR-XR) 75 MG 24 hr capsule Take 75 mg by mouth daily with breakfast. (Patient not taking: Reported on 06/24/2023)      Home: Home Living Family/patient expects to be discharged to:: Private residence Living Arrangements: Spouse/significant other Available Help at Discharge: Family, Friend(s) Type of Home: House Home Layout: One level Bathroom Shower/Tub: Sponge bathes at baseline [employer]: Standard Home Equipment: Agricultural consultant (2 wheels), Other (comment) (lift chair) Additional Comments: Wife unable to provide physical assistance  Functional History: Prior Function Prior Level of Function : Patient poor historian/Family not available, Needs assist Mobility Comments: uses RW for mobility ADLs Comments: sponge bathing for a few years per grandson,  able to dress self. wife does IADLs, neither drive so friends assist with groceries Functional Status:  Mobility: Bed Mobility Overal bed mobility: Needs Assistance Bed Mobility: Supine to Sit Supine to sit: Mod assist, HOB elevated General bed mobility comments: In recliner Transfers Overall transfer level: Needs assistance Equipment used: Rolling walker (2 wheels), 1 person hand held assist Transfers: Sit to/from Stand Sit to Stand: Mod assist General transfer comment: Mod assist for boost to stand with initial attempt no AD, hand held assist. With RW for support, required min assist for boost, Cues for hand placement. Ambulation/Gait Ambulation/Gait assistance: Min assist Gait Distance (Feet): 32 Feet Assistive device: Rolling walker (2 wheels) Gait Pattern/deviations: Step-through pattern, Decreased stride length, Decreased weight shift to right, Shuffle, Drifts right/left, Trunk flexed General Gait Details: Min assist for RW control initial half of distance covered. Cues for larger step length and foot clearance. Minor drift, started to self correct. Cues for awareness. No overt buckling. Able to maintain grasp on RW throughout. Gait velocity: decr Gait velocity interpretation: <1.31 ft/sec, indicative of household ambulator    ADL: ADL Overall  ADL's : Needs assistance/impaired Eating/Feeding: Set up Grooming: Min guard, Standing, Oral care, Wash/dry face Grooming Details (indicate cue type and reason): some assist for balance with posterior balance when standing unsupported Upper Body Bathing: Minimal assistance, Sitting Lower Body Bathing: Moderate assistance, Sit to/from stand Upper Body Dressing : Minimal assistance, Sitting Lower Body Dressing: Moderate assistance, Sit to/from stand Toilet Transfer: Minimal assistance, Ambulation, Rolling walker (2 wheels) Toileting- Clothing Manipulation and Hygiene: Moderate assistance, Sitting/lateral lean, Sit to/from  stand Functional mobility during ADLs: Minimal assistance, Rolling walker (2 wheels) General ADL Comments: Pt with limitations due to cognitive deficits, weakness and difficulty manuevering RW  Cognition: Cognition Overall Cognitive Status: No family/caregiver present to determine baseline cognitive functioning (Very short term memory impairement. Unaware of deficits. Oriented to self. Cannot recall month/season, and year incorrect "2026". Oriented to situation several times during session but unable to recall when asked why he was hospitalized.) Orientation Level: Oriented to person, Oriented to place, Disoriented to time, Disoriented to situation Memory: Impaired Memory Impairment: Prospective memory (did not recall that he asked SLP to bring him coffee when eval was done) Problem Solving: Impaired Problem Solving Impairment: Functional basic Safety/Judgment: Impaired (pt states he wants to go home despite knowing he had a stroke and has weakness with speech and language deficits) Cognition Arousal/Alertness: Awake/alert Behavior During Therapy: WFL for tasks assessed/performed Overall Cognitive Status: No family/caregiver present to determine baseline cognitive functioning (Very short term memory impairement. Unaware of deficits. Oriented to self. Cannot recall month/season, and year incorrect "2026". Oriented to situation several times during session but unable to recall when asked why he was hospitalized.) General Comments: per grandson, pt with memory impairments (sometimes will forget grandson's name if he has not been by in a couple of days). pt A&Ox2 today, unable to report month until provided options (typically able to report this) and reports coming in due to pain - unable to report stroke symptoms. Follows directions with multimodal cues, assist for DME and safety  Blood pressure (!) 171/106, pulse 63, temperature 97.9 F (36.6 C), temperature source Oral, resp. rate 18, height 5'  11" (1.803 m), weight 95 kg, SpO2 95%. Physical Exam Constitutional:      Comments: Pt lethargic but does arouse to conversation/verbal stimulation  HENT:     Right Ear: External ear normal.     Left Ear: External ear normal.     Mouth/Throat:     Pharynx: Oropharynx is clear.  Cardiovascular:     Rate and Rhythm: Normal rate.  Pulmonary:     Effort: Pulmonary effort is normal.  Abdominal:     Palpations: Abdomen is soft.  Musculoskeletal:        General: No swelling or tenderness.     Cervical back: Normal range of motion.  Skin:    General: Skin is warm.  Neurological:     Comments: Pt lethargic but arouses fairly easily. Oriented to self and can answer some basic questions about home (wife is there, where he lives). Follows basic commands. Speech very dysarthric. RUE grossly 3 to 4-/5. RLE similar at 3/5. Moves left side fairly easily. Senses pain in all 4's. No resting abnl tone. DTR's tr to 1+.  Psychiatric:     Comments: Pt flat, cooperates     Results for orders placed or performed during the hospital encounter of 06/23/23 (from the past 24 hour(s))  CBC     Status: None   Collection Time: 06/25/23  3:12 AM  Result Value Ref Range  WBC 7.1 4.0 - 10.5 K/uL   RBC 4.34 4.22 - 5.81 MIL/uL   Hemoglobin 13.8 13.0 - 17.0 g/dL   HCT 21.3 08.6 - 57.8 %   MCV 92.2 80.0 - 100.0 fL   MCH 31.8 26.0 - 34.0 pg   MCHC 34.5 30.0 - 36.0 g/dL   RDW 46.9 62.9 - 52.8 %   Platelets 212 150 - 400 K/uL   nRBC 0.0 0.0 - 0.2 %  Glucose, capillary     Status: Abnormal   Collection Time: 06/25/23 12:05 PM  Result Value Ref Range   Glucose-Capillary 134 (H) 70 - 99 mg/dL   Comment 1 Notify RN    Comment 2 Document in Chart    ECHOCARDIOGRAM COMPLETE  Result Date: 06/24/2023    ECHOCARDIOGRAM REPORT   Patient Name:   Michael Kidd Date of Exam: 06/24/2023 Medical Rec #:  [id number]     Height:       71.0 in Accession #:    [id number]    Weight:       209.4 lb Date of Birth:  02-Dec-1930     BSA:          2.150 m Patient Age:    89 years      BP:           156/84 mmHg Patient Gender: M             HR:           71 bpm. Exam Location:  Inpatient Procedure: 2D Echo, Color Doppler and Cardiac Doppler Indications:    Stroke  History:        Patient has prior history of Echocardiogram examinations, most                 recent 01/04/2023. CAD, Aortic Valve Disease and s/p TAVR w/                 on 09/24/20; Risk Factors:Dyslipidemia and                 Hypertension.  Sonographer:    Milbert Coulter Referring Phys: [id number] Cecille Po MELVIN IMPRESSIONS  1. Left ventricular ejection fraction, by estimation, is 60 to 65%. The left ventricle has normal function. The left ventricle has no regional wall motion abnormalities. There is mild concentric left ventricular hypertrophy. Left ventricular diastolic parameters are consistent with Grade I diastolic dysfunction (impaired relaxation).  2. Right ventricular systolic function is normal. The right ventricular size is normal.  3. Left atrial size was mild to moderately dilated.  4. The mitral valve is normal in structure. No evidence of mitral valve regurgitation. No evidence of mitral stenosis.  5. The aortic valve has been repaired/replaced. Aortic valve regurgitation is mild. No aortic stenosis is present. Echo findings are consistent with normal structure and function of the aortic valve prosthesis. Aortic valve mean gradient measures 9.0 mmHg. Aortic valve Vmax measures 2.10 m/s. FINDINGS  Left Ventricle: Left ventricular ejection fraction, by estimation, is 60 to 65%. The left ventricle has normal function. The left ventricle has no regional wall motion abnormalities. The left ventricular internal cavity size was normal in size. There is  mild concentric left ventricular hypertrophy. Left ventricular diastolic parameters are consistent with Grade I diastolic dysfunction (impaired relaxation). Right Ventricle: The right ventricular size is normal. No  increase in right ventricular wall thickness. Right ventricular systolic function is normal. Left Atrium: Left atrial size was mild to moderately dilated.  Right Atrium: Right atrial size was normal in size. Pericardium: There is no evidence of pericardial effusion. Mitral Valve: The mitral valve is normal in structure. No evidence of mitral valve regurgitation. No evidence of mitral valve stenosis. Tricuspid Valve: The tricuspid valve is normal in structure. Tricuspid valve regurgitation is mild . No evidence of tricuspid stenosis. Aortic Valve: The aortic valve has been repaired/replaced. Aortic valve regurgitation is mild. Aortic regurgitation PHT measures 611 msec. No aortic stenosis is present. Aortic valve mean gradient measures 9.0 mmHg. Aortic valve peak gradient measures 17.6 mmHg. There is a 26 mm Sapien prosthetic, stented (TAVR) valve present in the aortic position. Echo findings are consistent with normal structure and function of the aortic valve prosthesis. Pulmonic Valve: The pulmonic valve was normal in structure. Pulmonic valve regurgitation is trivial. No evidence of pulmonic stenosis. Aorta: The aortic root is normal in size and structure. Venous: The inferior vena cava was not well visualized. IAS/Shunts: No atrial level shunt detected by color flow Doppler.  LEFT VENTRICLE PLAX 2D LVIDd:         3.50 cm Diastology LVIDs:         2.80 cm LV e' medial:    5.55 cm/s LV PW:         1.40 cm LV E/e' medial:  8.0 LV IVS:        1.40 cm LV e' lateral:   7.18 cm/s                        LV E/e' lateral: 6.2  RIGHT VENTRICLE RV S prime:     11.60 cm/s TAPSE (M-mode): 1.7 cm LEFT ATRIUM             Index        RIGHT ATRIUM           Index LA Vol (A2C):   58.4 ml 27.16 ml/m  RA Area:     17.30 cm LA Vol (A4C):   52.2 ml 24.28 ml/m  RA Volume:   45.20 ml  21.02 ml/m LA Biplane Vol: 56.9 ml 26.46 ml/m  AORTIC VALVE AV Vmax:           210.00 cm/s AV Vmean:          139.000 cm/s AV VTI:            0.407 m  AV Peak Grad:      17.6 mmHg AV Mean Grad:      9.0 mmHg LVOT Vmax:         93.80 cm/s LVOT Vmean:        61.300 cm/s LVOT VTI:          0.202 m LVOT/AV VTI ratio: 0.50 AI PHT:            611 msec  AORTA Ao Asc diam: 3.30 cm MITRAL VALVE               TRICUSPID VALVE MV Area (PHT): 3.53 cm    TR Peak grad:   22.8 mmHg MV Decel Time: 215 msec    TR Vmax:        239.00 cm/s MV E velocity: 44.50 cm/s MV A velocity: 99.60 cm/s  SHUNTS MV E/A ratio:  0.45        Systemic VTI: 0.20 m Arvilla Meres MD Electronically signed by Arvilla Meres MD Signature Date/Time: 06/24/2023/1:04:46 PM    Final    MR BRAIN WO CONTRAST  Result  Date: 06/23/2023 CLINICAL DATA:  Acute neurologic deficit EXAM: MRI HEAD WITHOUT CONTRAST TECHNIQUE: Multiplanar, multiecho pulse sequences of the brain and surrounding structures were obtained without intravenous contrast. COMPARISON:  01/04/2023 FINDINGS: Brain: Small area of abnormal diffusion restriction of the left corpus striatum. No acute or chronic hemorrhage. There is multifocal hyperintense T2-weighted signal within the white matter. Generalized volume loss. Old right parietal and occipital infarcts. Old small vessel infarcts of the basal ganglia, cerebellum and corona radiata. The midline structures are normal. Vascular: Major flow voids are preserved. Skull and upper cervical spine: Normal calvarium and skull base. Visualized upper cervical spine and soft tissues are normal. Sinuses/Orbits:Mild maxillary sinus mucosal thickening. Normal orbits. IMPRESSION: 1. Small area of acute ischemia of the left corpus striatum. No hemorrhage or mass effect. 2. Old right parietal and occipital infarcts. 3. Old small vessel infarcts of the basal ganglia, cerebellum and corona radiata. Electronically Signed   By: Deatra Robinson M.D.   On: 06/23/2023 20:25   CT ANGIO HEAD NECK W WO CM (CODE STROKE)  Result Date: 06/23/2023 CLINICAL DATA:  Neuro deficit, acute, stroke suspected. Right-sided  deficit. EXAM: CT ANGIOGRAPHY HEAD AND NECK WITH AND WITHOUT CONTRAST TECHNIQUE: Multidetector CT imaging of the head and neck was performed using the standard protocol during bolus administration of intravenous contrast. Multiplanar CT image reconstructions and MIPs were obtained to evaluate the vascular anatomy. Carotid stenosis measurements (when applicable) are obtained utilizing NASCET criteria, using the distal internal carotid diameter as the denominator. RADIATION DOSE REDUCTION: This exam was performed according to the departmental dose-optimization program which includes automated exposure control, adjustment of the mA and/or kV according to patient size and/or use of iterative reconstruction technique. CONTRAST:  75mL OMNIPAQUE IOHEXOL 350 MG/ML SOLN COMPARISON:  Head CT 06/23/2023. CTA head 01/04/2023. MRI brain 08/26/2019. FINDINGS: CTA NECK FINDINGS Aortic arch: Four vessel arch configuration with separate origin of the left vertebral artery. Atherosclerotic calcifications of the aortic arch and arch vessel origins. Arch vessel origins are patent. Right carotid system: No evidence of dissection, stenosis (50% or greater), or occlusion. Calcified plaque of the right carotid bulb. Tortuous distal cervical ICA. Left carotid system: No evidence of dissection, stenosis (50% or greater), or occlusion. Mixed plaque of the left carotid bulb. Mildly tortuous distal cervical ICA. Vertebral arteries: Codominant. No evidence of dissection, stenosis (50% or greater), or occlusion. Skeleton: Mild cervical spondylosis without high-grade spinal canal stenosis. Other neck: Unremarkable. Upper chest: Unremarkable. Review of the MIP images confirms the above findings CTA HEAD FINDINGS Anterior circulation: Calcified plaque along the carotid siphons without hemodynamically significant stenosis. The proximal ACAs and MCAs are patent without stenosis or aneurysm. Distal branches are symmetric. Posterior circulation: Normal  basilar artery. The SCAs, AICAs and PICAs are patent proximally. The PCAs are patent proximally without stenosis or aneurysm. Distal branches are symmetric. Venous sinuses: As permitted by contrast timing, patent. Anatomic variants: Persistent fetal origin of the left PCA with hypoplastic left P1 segment. Review of the MIP images confirms the above findings IMPRESSION: No large vessel occlusion, hemodynamically significant stenosis, or aneurysm in the head or neck. Aortic Atherosclerosis (ICD10-I70.0). Electronically Signed   By: Orvan Falconer M.D.   On: 06/23/2023 17:15   CT HEAD CODE STROKE WO CONTRAST  Result Date: 06/23/2023 CLINICAL DATA:  Code stroke. Neuro deficit, acute, stroke suspected. Slurred speech and right-sided weakness. EXAM: CT HEAD WITHOUT CONTRAST TECHNIQUE: Contiguous axial images were obtained from the base of the skull through the vertex without intravenous contrast.  RADIATION DOSE REDUCTION: This exam was performed according to the departmental dose-optimization program which includes automated exposure control, adjustment of the mA and/or kV according to patient size and/or use of iterative reconstruction technique. COMPARISON:  Head CT 01/04/2023. FINDINGS: Brain: New asymmetric hypoattenuation in the posterior limb of the left internal capsule, suspicious for acute infarct. Old infarct in the left thalamus. Background of moderate chronic small-vessel disease. No hydrocephalus or extra-axial collection. No mass effect or midline shift. Vascular: No hyperdense vessel or unexpected calcification. Skull: No calvarial fracture or suspicious bone lesion. Skull base is unremarkable. Sinuses/Orbits: Mild mucosal disease in the maxillary and frontal sinuses. Orbits are unremarkable. Other: None. ASPECTS ([place]ta Stroke Program Early CT Score) - Ganglionic level infarction (caudate, lentiform nuclei, internal capsule, insula, M1-M3 cortex): 6 - Supraganglionic infarction (M4-M6 cortex): 3  Total score (0-10 with 10 being normal): 9 IMPRESSION: 1. New asymmetric hypoattenuation in the posterior limb of the left internal capsule, suspicious for acute infarct. No hemorrhage or mass effect. 2. ASPECT score is 9. Code stroke imaging results were communicated on 06/23/2023 at 5:09 pm to provider Dr. Derry Lory via secure text paging. Electronically Signed   By: Orvan Falconer M.D.   On: 06/23/2023 17:09    Assessment/Plan: Diagnosis: 89 yo male with new left corpus striatum infarct (prior left brain infarcts) Does the need for close, 24 hr/day medical supervision in concert with the patient's rehab needs make it unreasonable for this patient to be served in a less intensive setting? Yes Co-Morbidities requiring supervision/potential complications:  -BPH/bladder function -MI/HTN -hx of AS/TAVR Due to bladder management, bowel management, safety, skin/wound care, disease management, medication administration, pain management, and patient education, does the patient require 24 hr/day rehab nursing? Yes Does the patient require coordinated care of a physician, rehab nurse, therapy disciplines of PT, OT, SLP to address physical and functional deficits in the context of the above medical diagnosis(es)? Yes Addressing deficits in the following areas: balance, endurance, locomotion, strength, transferring, bowel/bladder control, bathing, dressing, feeding, grooming, toileting, cognition, speech, swallowing, and psychosocial support Can the patient actively participate in an intensive therapy program of at least 3 hrs of therapy per day at least 5 days per week? Yes The potential for patient to make measurable gains while on inpatient rehab is excellent Anticipated functional outcomes upon discharge from inpatient rehab are supervision  with PT, supervision with OT, supervision with SLP. Estimated rehab length of stay to reach the above functional goals is: 12-17 days Anticipated discharge  destination: Home Overall Rehab/Functional Prognosis: excellent  POST ACUTE RECOMMENDATIONS: This patient's condition is appropriate for continued rehabilitative care in the following setting: CIR Patient has agreed to participate in recommended program. Yes Note that insurance prior authorization may be required for reimbursement for recommended care.  Comment: Rehab Admissions Coordinator to follow up       I have personally performed a face to face diagnostic evaluation of this patient. Additionally, I have examined the patient's medical record including any pertinent labs and radiographic images. If the physician assistant has documented in this note, I have reviewed and edited or otherwise concur with the physician assistant's documentation.  Thanks,  Ranelle Oyster, MD 06/25/2023

## 2023-06-26 DIAGNOSIS — I639 Cerebral infarction, unspecified: Secondary | ICD-10-CM | POA: Diagnosis not present

## 2023-06-26 DIAGNOSIS — N400 Enlarged prostate without lower urinary tract symptoms: Secondary | ICD-10-CM | POA: Diagnosis not present

## 2023-06-26 DIAGNOSIS — I350 Nonrheumatic aortic (valve) stenosis: Secondary | ICD-10-CM | POA: Diagnosis not present

## 2023-06-26 DIAGNOSIS — I2510 Atherosclerotic heart disease of native coronary artery without angina pectoris: Secondary | ICD-10-CM | POA: Diagnosis not present

## 2023-06-26 MED ORDER — POLYETHYLENE GLYCOL 3350 17 G PO PACK
17.0000 g | PACK | Freq: Every day | ORAL | Status: DC
  Administered 2023-06-26 – 2023-07-01 (×6): 17 g via ORAL
  Filled 2023-06-26 (×6): qty 1

## 2023-06-26 MED ORDER — POTASSIUM CHLORIDE CRYS ER 20 MEQ PO TBCR
40.0000 meq | EXTENDED_RELEASE_TABLET | Freq: Once | ORAL | Status: AC
  Administered 2023-06-26: 40 meq via ORAL
  Filled 2023-06-26: qty 2

## 2023-06-26 MED ORDER — AMLODIPINE BESYLATE 5 MG PO TABS
5.0000 mg | ORAL_TABLET | Freq: Every day | ORAL | Status: DC
  Administered 2023-06-26 – 2023-07-01 (×6): 5 mg via ORAL
  Filled 2023-06-26 (×6): qty 1

## 2023-06-26 MED ORDER — DOCUSATE SODIUM 100 MG PO CAPS
100.0000 mg | ORAL_CAPSULE | Freq: Two times a day (BID) | ORAL | Status: DC
  Administered 2023-06-26 – 2023-07-01 (×11): 100 mg via ORAL
  Filled 2023-06-26 (×11): qty 1

## 2023-06-26 NOTE — Progress Notes (Addendum)
PROGRESS NOTE    [name]  Michael Kidd DOB: 1930-01-09 DOA: 06/23/2023 PCP: Barron Alvine, MD    Brief Narrative:   Michael Kidd is a 89 y.o. male with medical history significant of hypertension, hyperlipidemia, GERD, stroke, depression, BPH, aortic stenosis status post TAVR, CAD status post stent presented to the hospital with not being completely himself with the facial droop right-sided weakness and aphasia.  In the ED, patient had hypertension.  Labs showed hypokalemia.  CT head showed hypoattenuated area suspicious for infarct.  CTA of the head and neck without any large vessel occlusion.  Neurology was consulted and patient was admitted hospital for possible stroke. At this time, patient is stable and waiting for CIR.  Assessment/Plan   Acute CVA Patient presented with right-sided sensory deficit and aphasia.  No history of previous stroke.  CT head with possibility of stroke.  Neurology on board.   MRI of the brain shows a small area of acute ischemia in the left corpus stratum.  Continue daily aspirin and Plavix, statin, 2D echocardiogram with LVEF of 60-65 percent, grade I diastolic dysfunction..  Hemoglobin A1c of 5.7.  Lipid panel showing HDL of 31 with LDL of 50.  Seen by PT, OT and recommended CIR.  Continue Lipitor.   Hypertension On lisinopril as outpatient.  Has been resumed.  Hyperlipidemia Continue Lipitor.   Depression - Not currently on duloxetine   Memory impairment Continue Namenda   History of CAD Continue aspirin, Plavix and statins.  Lisinopril has been resumed..  Negative COVID   History of aortic stenosis status post TAVR Stable at this time.   Hypokalemia.  Will replenish.  Check levels in AM.    DVT prophylaxis: enoxaparin (LOVENOX) injection 40 mg Start: 06/24/23 1445   Code Status:     Code Status: Full Code  Disposition: CIR as per PT evaluation.  Medically stable for disposition.  Status is: inpatient  The patient is inpatient  because: Acute stroke, need for rehabilitation.   Family Communication:   Unable to reach the spouse despite multiple times.  I was unable to reach the patient's daughter again today  Consultants:  Neurology  Procedures:  None  Antimicrobials:  none  Anti-infectives (From admission, onward)    None       Subjective:  Today, patient was seen and examined at bedside.  Still has weakness on the right side.  He states that he has been constipated and wishes something for it.  Denies any dizziness, lightheadedness, nausea vomiting or headache.    Objective: Vitals:   06/25/23 2318 06/26/23 0354 06/26/23 0730 06/26/23 0740  BP: (!) 158/95 (!) 136/98 (!) 142/73 (!) 142/73  Pulse: 69 61 70 70  Resp: 17 16 18 16   Temp: 98.6 F (37 C) 98.4 F (36.9 C) 97.7 F (36.5 C) 97.7 F (36.5 C)  TempSrc: Oral Oral Oral   SpO2: 94% 95% 95% 95%  Weight:      Height:        Intake/Output Summary (Last 24 hours) at 06/26/2023 1112 Last data filed at 06/26/2023 0900 Gross per 24 hour  Intake 480 ml  Output --  Net 480 ml   Filed Weights   06/23/23 1600  Weight: 95 kg    Physical Examination: Body mass index is 29.21 kg/m.   General:  Average built, not in obvious distress, elderly male, Communicative. HENT:   No scleral pallor or icterus noted. Oral mucosa is moist.  Chest:  Clear breath sounds.  No crackles or wheezes.  CVS: S1 &S2 heard. No murmur.  Regular rate and rhythm. Abdomen: Soft, nontender, nondistended.  Bowel sounds are heard.   Extremities: No cyanosis, clubbing or edema.   Psych: Alert, awake and oriented to self and place, CNS: Right facial droop, right-sided weakness, oriented to self and place.  Speech comprehensible. Skin: Warm and dry.  No rashes noted.  Data Reviewed:   CBC: Recent Labs  Lab 06/23/23 1646 06/23/23 1648 06/25/23 0312  WBC  --  6.9 7.1  NEUTROABS  --  3.6  --   HGB 12.6* 13.1 13.8  HCT 37.0* 39.7 40.0  MCV  --  94.1 92.2  PLT   --  210 212    Basic Metabolic Panel: Recent Labs  Lab 06/23/23 1646 06/23/23 1648 06/25/23 0312  NA 143 142 139  K 3.5 3.4* 3.1*  CL 106 105 105  CO2  --  24 24  GLUCOSE 132* 136* 116*  BUN 17 16 14   CREATININE 1.00 1.01 1.03  CALCIUM  --  8.8* 8.3*  MG  --   --  2.2    Liver Function Tests: Recent Labs  Lab 06/23/23 1648  AST 30  ALT 26  ALKPHOS 89  BILITOT 0.6  PROT 6.0*  ALBUMIN 3.5     Radiology Studies: ECHOCARDIOGRAM COMPLETE  Result Date: 06/24/2023    ECHOCARDIOGRAM REPORT   Patient Name:   Michael Kidd Date of Exam: 06/24/2023 Medical Rec #:  [id number]     Height:       71.0 in Accession #:    [id number]    Weight:       209.4 lb Date of Birth:  [birth year]    BSA:          2.150 m Patient Age:    89 years      BP:           156/84 mmHg Patient Gender: M             HR:           71 bpm. Exam Location:  Inpatient Procedure: 2D Echo, Color Doppler and Cardiac Doppler Indications:    Stroke  History:        Patient has prior history of Echocardiogram examinations, most                 recent 01/04/2023. CAD, Aortic Valve Disease and s/p TAVR w/                 on 09/24/20; Risk Factors:Dyslipidemia and                 Hypertension.  Sonographer:    Milbert Coulter Referring Phys: [id number] Cecille Po MELVIN IMPRESSIONS  1. Left ventricular ejection fraction, by estimation, is 60 to 65%. The left ventricle has normal function. The left ventricle has no regional wall motion abnormalities. There is mild concentric left ventricular hypertrophy. Left ventricular diastolic parameters are consistent with Grade I diastolic dysfunction (impaired relaxation).  2. Right ventricular systolic function is normal. The right ventricular size is normal.  3. Left atrial size was mild to moderately dilated.  4. The mitral valve is normal in structure. No evidence of mitral valve regurgitation. No evidence of mitral stenosis.  5. The aortic valve has been repaired/replaced. Aortic valve  regurgitation is mild. No aortic stenosis is present. Echo findings are consistent with normal structure and function of the aortic valve prosthesis. Aortic valve  mean gradient measures 9.0 mmHg. Aortic valve Vmax measures 2.10 m/s. FINDINGS  Left Ventricle: Left ventricular ejection fraction, by estimation, is 60 to 65%. The left ventricle has normal function. The left ventricle has no regional wall motion abnormalities. The left ventricular internal cavity size was normal in size. There is  mild concentric left ventricular hypertrophy. Left ventricular diastolic parameters are consistent with Grade I diastolic dysfunction (impaired relaxation). Right Ventricle: The right ventricular size is normal. No increase in right ventricular wall thickness. Right ventricular systolic function is normal. Left Atrium: Left atrial size was mild to moderately dilated. Right Atrium: Right atrial size was normal in size. Pericardium: There is no evidence of pericardial effusion. Mitral Valve: The mitral valve is normal in structure. No evidence of mitral valve regurgitation. No evidence of mitral valve stenosis. Tricuspid Valve: The tricuspid valve is normal in structure. Tricuspid valve regurgitation is mild . No evidence of tricuspid stenosis. Aortic Valve: The aortic valve has been repaired/replaced. Aortic valve regurgitation is mild. Aortic regurgitation PHT measures 611 msec. No aortic stenosis is present. Aortic valve mean gradient measures 9.0 mmHg. Aortic valve peak gradient measures 17.6 mmHg. There is a 26 mm Sapien prosthetic, stented (TAVR) valve present in the aortic position. Echo findings are consistent with normal structure and function of the aortic valve prosthesis. Pulmonic Valve: The pulmonic valve was normal in structure. Pulmonic valve regurgitation is trivial. No evidence of pulmonic stenosis. Aorta: The aortic root is normal in size and structure. Venous: The inferior vena cava was not well visualized.  IAS/Shunts: No atrial level shunt detected by color flow Doppler.  LEFT VENTRICLE PLAX 2D LVIDd:         3.50 cm Diastology LVIDs:         2.80 cm LV e' medial:    5.55 cm/s LV PW:         1.40 cm LV E/e' medial:  8.0 LV IVS:        1.40 cm LV e' lateral:   7.18 cm/s                        LV E/e' lateral: 6.2  RIGHT VENTRICLE RV S prime:     11.60 cm/s TAPSE (M-mode): 1.7 cm LEFT ATRIUM             Index        RIGHT ATRIUM           Index LA Vol (A2C):   58.4 ml 27.16 ml/m  RA Area:     17.30 cm LA Vol (A4C):   52.2 ml 24.28 ml/m  RA Volume:   45.20 ml  21.02 ml/m LA Biplane Vol: 56.9 ml 26.46 ml/m  AORTIC VALVE AV Vmax:           210.00 cm/s AV Vmean:          139.000 cm/s AV VTI:            0.407 m AV Peak Grad:      17.6 mmHg AV Mean Grad:      9.0 mmHg LVOT Vmax:         93.80 cm/s LVOT Vmean:        61.300 cm/s LVOT VTI:          0.202 m LVOT/AV VTI ratio: 0.50 AI PHT:            611 msec  AORTA Ao Asc diam: 3.30 cm MITRAL VALVE  TRICUSPID VALVE MV Area (PHT): 3.53 cm    TR Peak grad:   22.8 mmHg MV Decel Time: 215 msec    TR Vmax:        239.00 cm/s MV E velocity: 44.50 cm/s MV A velocity: 99.60 cm/s  SHUNTS MV E/A ratio:  0.45        Systemic VTI: 0.20 m Arvilla Meres MD Electronically signed by Arvilla Meres MD Signature Date/Time: 06/24/2023/1:04:46 PM    Final       LOS: 2 days     Joycelyn Das, MD [facility] Available via Epic secure chat 7am-7pm After these hours, please refer to coverage provider listed on [email or web address] 06/26/2023, 11:12 AM

## 2023-06-26 NOTE — Progress Notes (Signed)
[phone] Pt on call light. This Water engineer in pt room. Pt found on floor. Pt stated he lost his footing. Pt assisted back to bed. Pt denies the presence of pain. Pt denies hitting his head. Pt moving all extremities, pt free of injury. MD made aware. No new orders noted.

## 2023-06-27 DIAGNOSIS — I639 Cerebral infarction, unspecified: Secondary | ICD-10-CM | POA: Diagnosis not present

## 2023-06-27 LAB — BASIC METABOLIC PANEL WITH GFR
Anion gap: 7 (ref 5–15)
BUN: 21 mg/dL (ref 8–23)
CO2: 21 mmol/L — ABNORMAL LOW (ref 22–32)
Calcium: 8.2 mg/dL — ABNORMAL LOW (ref 8.9–10.3)
Chloride: 112 mmol/L — ABNORMAL HIGH (ref 98–111)
Creatinine, Ser: 1.05 mg/dL (ref 0.61–1.24)
GFR, Estimated: >60 mL/min
Glucose, Bld: 116 mg/dL — ABNORMAL HIGH (ref 70–99)
Potassium: 3.9 mmol/L (ref 3.5–5.1)
Sodium: 140 mmol/L (ref 135–145)

## 2023-06-27 NOTE — Plan of Care (Signed)

## 2023-06-27 NOTE — Progress Notes (Addendum)
PROGRESS NOTE    Michael Kidd  ZHY:[id number] DOB: [birth year] DOA: 06/23/2023 PCP: Barron Alvine, MD    Brief Narrative:  89 year old with history of hypertension, hyperlipidemia, GERD, depression, BPH, status post TAVR, CAD status post stent presents from home with right-sided weakness and facial droop.  He was found to have acute stroke.  Waiting for rehab.   Assessment & Plan:   Acute left MCA territory stroke: Clinical findings, woke up confused in the afternoon, slow to respond, garbled speech and worsening of his facial droop. CT head findings, new asymmetric hypoattenuation in the posterior left internal capsule MRI of the brain, acute ischemia of the left corpus stratum, old right parietal and occipital infarcts CT angiogram head and neck, no large vessel occlusion 2D echocardiogram, normal ejection fraction, grade 1 diastolic dysfunction, no intracardiac thrombus. Antiplatelet therapy, on Plavix 75 mg daily before admission.  Now on aspirin and Brilinta for 4 weeks then aspirin alone. LDL 50, Lipitor 80 mg daily Hemoglobin A1c, 5.8.  No indication for treatment. DVT prophylaxis, Lovenox subcu. Therapy recommendations, acute inpatient rehab.  Chronic medical issues including Essential hypertension, lisinopril Hyperlipidemia, Lipitor as above Memory impairment, on Namenda History of coronary artery disease, on aspirin Plavix and statin.  Plavix substituted with Brilinta Aortic stenosis status post TAVR: Stable. Hypokalemia: Stable.  Replaced.     DVT prophylaxis: enoxaparin (LOVENOX) injection 40 mg Start: 06/24/23 1445   Code Status: Full code Family Communication: None Disposition Plan: Status is: Inpatient Remains inpatient appropriate because: Waiting for acute inpatient rehab     Consultants:  Neurology Procedures:  None  Antimicrobials:  None   Subjective: Patient seen and examined at the bedside.  He is emotional.  He is worried about him going  in to debt and owing [place] money for the care, I told him that his hospital stay will be paid by his insurance as well as rehab. Overnight events noted.  Patient was helped to the bathroom, left alone and fell on the floor before getting to the bed.  No injuries.  Objective: Vitals:   06/26/23 2357 06/27/23 0358 06/27/23 0832 06/27/23 1234  BP: (!) 144/92 (!) 143/77 (!) 170/87 (!) 172/74  Pulse: 71 61 77 67  Resp: 17 18 15 15   Temp: 97.7 F (36.5 C) 97.9 F (36.6 C) 98.2 F (36.8 C) 97.7 F (36.5 C)  TempSrc: Oral Oral Oral Oral  SpO2: 95% 94% 94% 95%  Weight:      Height:        Intake/Output Summary (Last 24 hours) at 06/27/2023 1425 Last data filed at 06/27/2023 0732 Gross per 24 hour  Intake 240 ml  Output 625 ml  Net -385 ml   Filed Weights   06/23/23 1600  Weight: 95 kg    Examination:  General exam: Appears calm and comfortable  Respiratory system: Clear to auscultation. Respiratory effort normal. Cardiovascular system: S1 & S2 heard, RRR.  Gastrointestinal system: Abdomen is nondistended, soft and nontender. No organomegaly or masses felt. Normal bowel sounds heard. Central nervous system: Alert and oriented.  Right-sided facial droop.   Speech is clear.  Very hard of hearing.   He has a right sided weakness.    Data Reviewed: I have personally reviewed following labs and imaging studies  CBC: Recent Labs  Lab 06/23/23 1646 06/23/23 1648 06/25/23 0312  WBC  --  6.9 7.1  NEUTROABS  --  3.6  --   HGB 12.6* 13.1 13.8  HCT 37.0* 39.7 40.0  MCV  --  94.1 92.2  PLT  --  210 212   Basic Metabolic Panel: Recent Labs  Lab 06/23/23 1646 06/23/23 1648 06/25/23 0312 06/27/23 0415  NA 143 142 139 140  K 3.5 3.4* 3.1* 3.9  CL 106 105 105 112*  CO2  --  24 24 21*  GLUCOSE 132* 136* 116* 116*  BUN 17 16 14 21   CREATININE 1.00 1.01 1.03 1.05  CALCIUM  --  8.8* 8.3* 8.2*  MG  --   --  2.2  --    GFR: Estimated Creatinine Clearance: 55 mL/min (by C-G  formula based on SCr of 1.05 mg/dL). Liver Function Tests: Recent Labs  Lab 06/23/23 1648  AST 30  ALT 26  ALKPHOS 89  BILITOT 0.6  PROT 6.0*  ALBUMIN 3.5   No results for input(s): "LIPASE", "AMYLASE" in the last 168 hours. No results for input(s): "AMMONIA" in the last 168 hours. Coagulation Profile: Recent Labs  Lab 06/23/23 1648  INR 1.2   Cardiac Enzymes: No results for input(s): "CKTOTAL", "CKMB", "CKMBINDEX", "TROPONINI" in the last 168 hours. BNP (last 3 results) No results for input(s): "PROBNP" in the last 8760 hours. HbA1C: No results for input(s): "HGBA1C" in the last 72 hours. CBG: Recent Labs  Lab 06/23/23 1642 06/25/23 1205  GLUCAP 127* 134*   Lipid Profile: No results for input(s): "CHOL", "HDL", "LDLCALC", "TRIG", "CHOLHDL", "LDLDIRECT" in the last 72 hours. Thyroid Function Tests: No results for input(s): "TSH", "T4TOTAL", "FREET4", "T3FREE", "THYROIDAB" in the last 72 hours. Anemia Panel: No results for input(s): "VITAMINB12", "FOLATE", "FERRITIN", "TIBC", "IRON", "RETICCTPCT" in the last 72 hours. Sepsis Labs: No results for input(s): "PROCALCITON", "LATICACIDVEN" in the last 168 hours.  No results found for this or any previous visit (from the past 240 hour(s)).       Radiology Studies: No results found.      Scheduled Meds:  amLODipine  5 mg Oral Daily   aspirin EC  81 mg Oral Daily   atorvastatin  80 mg Oral QPM   docusate sodium  100 mg Oral BID   enoxaparin (LOVENOX) injection  40 mg Subcutaneous Q24H   lisinopril  20 mg Oral Daily   memantine  10 mg Oral BID   polyethylene glycol  17 g Oral Daily   ticagrelor  90 mg Oral BID   Continuous Infusions:   LOS: 3 days    Time spent: 35 minutes    Dorcas Carrow, MD [facility]

## 2023-06-27 NOTE — Progress Notes (Signed)
Occupational Therapy Treatment Patient Details Name: Michael Kidd MRN: [id number] DOB: 03-Mar-1930 Today's Date: 06/27/2023   History of present illness Pt is a 89 y/o male presenting 7/17 with slurred speech, facial droop and R sided deficits. MRI brain showed small area of acute ischemia of L corpus striatum. PMH: HTN, HLD, GERD, CVA, depression, BPH, aortic stenosis status post TAVR, CAD s/p stenting.   OT comments  Pt in bed upon therapy arrival using urinal. Agreed to get out of bed and sit up for dinner. Pt required physical assist and VC for sequencing to transition to sitting on EOB. Reports of dizziness during functional mobility. BP was checked and nursing was informed. Pt did not require any assist to set up meal tray and was able to complete self feeding at Mod I level. Mentioned that he was interested in shaving and asked about a razor. OT will continue to work with pt acutely. Patient will benefit from intensive inpatient follow up therapy, >3 hours/day.    Recommendations for follow up therapy are one component of a multi-disciplinary discharge planning process, led by the attending physician.  Recommendations may be updated based on patient status, additional functional criteria and insurance authorization.    Assistance Recommended at Discharge Frequent or constant Supervision/Assistance  Patient can return home with the following  A little help with walking and/or transfers;A lot of help with bathing/dressing/bathroom;Direct supervision/assist for medications management;Direct supervision/assist for financial management   Equipment Recommendations  BSC/3in1    Recommendations for Other Services Rehab consult    Precautions / Restrictions Precautions Precautions: Fall Precaution Comments: aphasia Restrictions Weight Bearing Restrictions: No       Mobility Bed Mobility Overal bed mobility: Needs Assistance Bed Mobility: Supine to Sit     Supine to sit: Mod  assist, HOB elevated     General bed mobility comments: Pt asked for assistance to bring trunk off HOB. VC provided for sequencing to transition. Required VC and visual cues to scoot hips towards EOB prior to standing.    Transfers Overall transfer level: Needs assistance Equipment used: Rolling walker (2 wheels) Transfers: Sit to/from Stand, Bed to chair/wheelchair/BSC Sit to Stand: Mod assist, From elevated surface     Step pivot transfers: Min assist     General transfer comment: Increased time required to power up from sitting to standing. VC provided for hand placement during RW management. VC for direction and sequencing around bed to recliner with RW.     Balance Overall balance assessment: Needs assistance Sitting-balance support: No upper extremity supported, Feet supported Sitting balance-Leahy Scale: Fair     Standing balance support: During functional activity, Reliant on assistive device for balance, Bilateral upper extremity supported Standing balance-Leahy Scale: Poor        ADL either performed or assessed with clinical judgement   ADL   Eating/Feeding: Modified independent;Sitting            Toileting- Clothing Manipulation and Hygiene: Modified independent;Bed level (using urinal only)                         Cognition Arousal/Alertness: Awake/alert Behavior During Therapy: WFL for tasks assessed/performed Overall Cognitive Status: No family/caregiver present to determine baseline cognitive functioning                  General Comments Pt reports feeling dizzy during functional mobility. BP checked once seated in recliner 170/82. Informed nursing.    Pertinent Vitals/ Pain  Pain Assessment Pain Assessment: No/denies pain         Frequency  Min 2X/week        Progress Toward Goals  OT Goals(current goals can now be found in the care plan section)  Progress towards OT goals: Progressing toward goals     Plan  Discharge plan remains appropriate;Frequency remains appropriate       AM-PAC OT "6 Clicks" Daily Activity     Outcome Measure   Help from another person eating meals?: None Help from another person taking care of personal grooming?: A Little Help from another person toileting, which includes using toliet, bedpan, or urinal?: A Lot Help from another person bathing (including washing, rinsing, drying)?: A Lot Help from another person to put on and taking off regular upper body clothing?: A Little Help from another person to put on and taking off regular lower body clothing?: A Lot 6 Click Score: 16    End of Session Equipment Utilized During Treatment: Gait belt;Rolling walker (2 wheels)  OT Visit Diagnosis: Other abnormalities of gait and mobility (R26.89);Muscle weakness (generalized) (M62.81);Other symptoms and signs involving cognitive function   Activity Tolerance Patient tolerated treatment well   Patient Left in chair;with call bell/phone within reach;with chair alarm set   Nurse Communication Mobility status;Other (comment) (reports of dizziness)        Time: 1730-1750 OT Time Calculation (min): 20 min  Charges: OT General Charges $OT Visit: 1 Visit OT Treatments $Self Care/Home Management : 8-22 mins  Michael Kidd, OTR/L,CBIS  Supplemental OT - [facility] and [facility] Secure Chat Preferred    Michael Kidd, [name] 06/27/2023, 6:00 PM

## 2023-06-28 DIAGNOSIS — I639 Cerebral infarction, unspecified: Secondary | ICD-10-CM | POA: Diagnosis not present

## 2023-06-28 NOTE — Progress Notes (Signed)
Inpatient Rehab Admissions Coordinator:   Received a denial from [facility] for CIR request.  I spoke to pt's spouse and she agrees with pursuing a fast appeal.  I will start that today and would expect to hear within 72 hours of time received.    Estill Dooms, PT, DPT Admissions Coordinator [phone] 06/28/23  11:53 AM

## 2023-06-28 NOTE — Progress Notes (Signed)
PROGRESS NOTE    Michael Kidd  NUU:[id number] DOB: [birth year] DOA: 06/23/2023 PCP: Barron Alvine, MD    Brief Narrative:  89 year old with history of hypertension, hyperlipidemia, GERD, depression, BPH, status post TAVR, CAD status post stent presents from home with right-sided weakness and facial droop.  He was found to have acute stroke.  Waiting for rehab.   Assessment & Plan:   Acute left MCA territory stroke: Clinical findings, woke up confused in the afternoon, slow to respond, garbled speech and worsening of his facial droop. CT head findings, new asymmetric hypoattenuation in the posterior left internal capsule MRI of the brain, acute ischemia of the left corpus stratum, old right parietal and occipital infarcts CT angiogram head and neck, no large vessel occlusion 2D echocardiogram, normal ejection fraction, grade 1 diastolic dysfunction, no intracardiac thrombus. Antiplatelet therapy, on Plavix 75 mg daily before admission.  Now on aspirin and Brilinta for 4 weeks then aspirin alone. LDL 50, Lipitor 80 mg daily Hemoglobin A1c, 5.8.  No indication for treatment. DVT prophylaxis, Lovenox subcu. Therapy recommendations, acute inpatient rehab.  Chronic medical issues including Essential hypertension, lisinopril Hyperlipidemia, Lipitor as above Memory impairment, on Namenda History of coronary artery disease, on aspirin Plavix and statin.  Plavix substituted with Brilinta Aortic stenosis status post TAVR: Stable. Hypokalemia: Stable.  Replaced.     DVT prophylaxis: enoxaparin (LOVENOX) injection 40 mg Start: 06/24/23 1445   Code Status: Full code Family Communication: None Disposition Plan: Status is: Inpatient Remains inpatient appropriate because: Waiting for acute inpatient rehab     Consultants:  Neurology Procedures:  None  Antimicrobials:  None   Subjective: Patient seen and examined.  "I want to go home", then agrees that he has poor mobility so  he has to go to rehab.  Objective: Vitals:   06/27/23 1954 06/27/23 2336 06/28/23 0333 06/28/23 0743  BP: 133/71 120/78 138/80 126/88  Pulse: 78 70 67 67  Resp: 16 16 20 18   Temp: 98 F (36.7 C) 97.8 F (36.6 C) 97.8 F (36.6 C) 97.8 F (36.6 C)  TempSrc: Oral Oral  Oral  SpO2: 93% 94% 96% 94%  Weight:      Height:        Intake/Output Summary (Last 24 hours) at 06/28/2023 1007 Last data filed at 06/28/2023 0518 Gross per 24 hour  Intake 720 ml  Output 475 ml  Net 245 ml   Filed Weights   06/23/23 1600  Weight: 95 kg    Examination:  General exam: Appears calm and comfortable  Respiratory system: Clear to auscultation. Respiratory effort normal. Cardiovascular system: S1 & S2 heard, RRR.  Gastrointestinal system: Abdomen is nondistended, soft and nontender. No organomegaly or masses felt. Normal bowel sounds heard. Central nervous system: Alert and oriented.  Right-sided facial droop.   Speech is clear.  Very hard of hearing.   He has right-sided motor weakness 4/5.    Data Reviewed: I have personally reviewed following labs and imaging studies  CBC: Recent Labs  Lab 06/23/23 1646 06/23/23 1648 06/25/23 0312  WBC  --  6.9 7.1  NEUTROABS  --  3.6  --   HGB 12.6* 13.1 13.8  HCT 37.0* 39.7 40.0  MCV  --  94.1 92.2  PLT  --  210 212   Basic Metabolic Panel: Recent Labs  Lab 06/23/23 1646 06/23/23 1648 06/25/23 0312 06/27/23 0415  NA 143 142 139 140  K 3.5 3.4* 3.1* 3.9  CL 106 105 105 112*  CO2  --  24 24 21*  GLUCOSE 132* 136* 116* 116*  BUN 17 16 14 21   CREATININE 1.00 1.01 1.03 1.05  CALCIUM  --  8.8* 8.3* 8.2*  MG  --   --  2.2  --    GFR: Estimated Creatinine Clearance: 55 mL/min (by C-G formula based on SCr of 1.05 mg/dL). Liver Function Tests: Recent Labs  Lab 06/23/23 1648  AST 30  ALT 26  ALKPHOS 89  BILITOT 0.6  PROT 6.0*  ALBUMIN 3.5   No results for input(s): "LIPASE", "AMYLASE" in the last 168 hours. No results for  input(s): "AMMONIA" in the last 168 hours. Coagulation Profile: Recent Labs  Lab 06/23/23 1648  INR 1.2   Cardiac Enzymes: No results for input(s): "CKTOTAL", "CKMB", "CKMBINDEX", "TROPONINI" in the last 168 hours. BNP (last 3 results) No results for input(s): "PROBNP" in the last 8760 hours. HbA1C: No results for input(s): "HGBA1C" in the last 72 hours. CBG: Recent Labs  Lab 06/23/23 1642 06/25/23 1205  GLUCAP 127* 134*   Lipid Profile: No results for input(s): "CHOL", "HDL", "LDLCALC", "TRIG", "CHOLHDL", "LDLDIRECT" in the last 72 hours. Thyroid Function Tests: No results for input(s): "TSH", "T4TOTAL", "FREET4", "T3FREE", "THYROIDAB" in the last 72 hours. Anemia Panel: No results for input(s): "VITAMINB12", "FOLATE", "FERRITIN", "TIBC", "IRON", "RETICCTPCT" in the last 72 hours. Sepsis Labs: No results for input(s): "PROCALCITON", "LATICACIDVEN" in the last 168 hours.  No results found for this or any previous visit (from the past 240 hour(s)).       Radiology Studies: No results found.      Scheduled Meds:  amLODipine  5 mg Oral Daily   aspirin EC  81 mg Oral Daily   atorvastatin  80 mg Oral QPM   docusate sodium  100 mg Oral BID   enoxaparin (LOVENOX) injection  40 mg Subcutaneous Q24H   lisinopril  20 mg Oral Daily   memantine  10 mg Oral BID   polyethylene glycol  17 g Oral Daily   ticagrelor  90 mg Oral BID   Continuous Infusions:   LOS: 4 days    Time spent: 35 minutes    Dorcas Carrow, MD [facility]

## 2023-06-28 NOTE — Plan of Care (Signed)

## 2023-06-28 NOTE — Care Management Important Message (Signed)
Important Message  Patient Details  Name: CAIGE ALMEDA MRN: [id number] Date of Birth: [birth year]   Medicare Important Message Given:  Yes     Dorena Bodo 06/28/2023, 3:42 PM

## 2023-06-28 NOTE — Progress Notes (Signed)
Physical Therapy Treatment Patient Details Name: Michael Kidd MRN: [id number] DOB: [birth year] Today's Date: 06/28/2023   History of Present Illness Pt is a 89 y/o male presenting 7/17 with slurred speech, facial droop and R sided deficits. MRI brain showed small area of acute ischemia of L corpus striatum. PMH: HTN, HLD, GERD, CVA, depression, BPH, aortic stenosis status post TAVR, CAD s/p stenting.    PT Comments  Progressing well, improved strength with transfers from bed, but still up to mod assist to stand from low toilet with rail use. Still with significant short term memory deficits. Reoriented to situation but does not recall later in session. Gradually improving gait symmetry and stability. Utilizing RW for support, up to 65 feet today with one standing rest break due to fatigue. Min assist for RW control, VC for placement and proximity. Cues for larger step with RLE showing immediate carryover but later in distance regresses and needs further cues. Patient will continue to benefit from skilled physical therapy services to further improve independence with functional mobility. Patient will benefit from intensive inpatient follow up therapy, >3 hours/day      Assistance Recommended at Discharge Frequent or constant Supervision/Assistance  If plan is discharge home, recommend the following:  Can travel by private vehicle    A lot of help with walking and/or transfers;A lot of help with bathing/dressing/bathroom;Assistance with cooking/housework;Direct supervision/assist for medications management;Direct supervision/assist for financial management;Assist for transportation;Help with stairs or ramp for entrance      Equipment Recommendations  None recommended by PT (TBD next venue)    Recommendations for Other Services       Precautions / Restrictions Precautions Precautions: Fall Precaution Comments: aphasia Restrictions Weight Bearing Restrictions: No     Mobility  Bed  Mobility Overal bed mobility: Needs Assistance Bed Mobility: Supine to Sit     Supine to sit: HOB elevated, Min assist     General bed mobility comments: Min assist for trunk to rise to EOB, and assist with LE sequencing. Effortful to push through RUE. Cues for technique.    Transfers Overall transfer level: Needs assistance Equipment used: Rolling walker (2 wheels) Transfers: Sit to/from Stand Sit to Stand: Mod assist           General transfer comment: Min assist for boost from bed today with slight balance impairment, pushing through back of legs into bed to stabilize. Mod assist to boost from toilet, using hand rail to pull with VC.    Ambulation/Gait Ambulation/Gait assistance: Min assist Gait Distance (Feet): 65 Feet (+15) Assistive device: Rolling walker (2 wheels) Gait Pattern/deviations: Step-through pattern, Decreased stride length, Decreased weight shift to right, Shuffle, Drifts right/left, Trunk flexed Gait velocity: decr Gait velocity interpretation: <1.31 ft/sec, indicative of household ambulator   General Gait Details: Min assist for walker placement and frequent VC for awareness and proximity to device with upright posture. No buckling. Needs cues to advance RLE into RW intermittently. Focused on symmetry of gait. Easily fatigued, required 1 standing rest break to complete distance out of room today.   Stairs             Wheelchair Mobility     Tilt Bed    Modified Rankin (Stroke Patients Only) Modified Rankin (Stroke Patients Only) Pre-Morbid Rankin Score: Slight disability Modified Rankin: Moderately severe disability     Balance Overall balance assessment: Needs assistance Sitting-balance support: No upper extremity supported, Feet supported Sitting balance-Leahy Scale: Fair     Standing balance support: During functional  activity, Reliant on assistive device for balance, Single extremity supported Standing balance-Leahy Scale: Poor                               Cognition Arousal/Alertness: Awake/alert Behavior During Therapy: WFL for tasks assessed/performed Overall Cognitive Status: No family/caregiver present to determine baseline cognitive functioning                                 General Comments: (from prior note: per grandson, pt with memory impairments (sometimes will forget grandson's name if he has not been by in a couple of days). Still unaware of situation, poor recall when reoriented.        Exercises General Exercises - Lower Extremity Ankle Circles/Pumps: AROM, Both, 15 reps, Seated Quad Sets: Strengthening, Both, 10 reps, Seated Gluteal Sets: Strengthening, Both, 10 reps, Seated Long Arc Quad: Strengthening, Both, 15 reps, Seated Hip ABduction/ADduction: Strengthening, Both, 10 reps, Seated Hip Flexion/Marching: Strengthening, Both, 10 reps, Seated    General Comments        Pertinent Vitals/Pain Pain Assessment Pain Assessment: No/denies pain    Home Living                          Prior Function            PT Goals (current goals can now be found in the care plan section) Acute Rehab PT Goals Patient Stated Goal: Go home PT Goal Formulation: Patient unable to participate in goal setting Time For Goal Achievement: 07/08/23 Potential to Achieve Goals: Good Progress towards PT goals: Progressing toward goals    Frequency    Min 4X/week      PT Plan Current plan remains appropriate    Co-evaluation              AM-PAC PT "6 Clicks" Mobility   Outcome Measure  Help needed turning from your back to your side while in a flat bed without using bedrails?: A Little Help needed moving from lying on your back to sitting on the side of a flat bed without using bedrails?: A Little Help needed moving to and from a bed to a chair (including a wheelchair)?: A Little Help needed standing up from a chair using your arms (e.g., wheelchair or  bedside chair)?: A Little Help needed to walk in hospital room?: A Little Help needed climbing 3-5 steps with a railing? : A Lot 6 Click Score: 17    End of Session Equipment Utilized During Treatment: Gait belt Activity Tolerance: Patient tolerated treatment well Patient left: in chair;with call bell/phone within reach;with chair alarm set Nurse Communication: Mobility status PT Visit Diagnosis: Unsteadiness on feet (R26.81);Other abnormalities of gait and mobility (R26.89);Muscle weakness (generalized) (M62.81);Difficulty in walking, not elsewhere classified (R26.2);Other symptoms and signs involving the nervous system (R29.898)     Time: 3295-1884 PT Time Calculation (min) (ACUTE ONLY): 15 min  Charges:    $Gait Training: 8-22 mins PT General Charges $$ ACUTE PT VISIT: 1 Visit                     Kathlyn Sacramento, PT, DPT [facility]  Rehabilitation Services Physical Therapist Office: [phone] Website: [email or web address]    Berton Mount 06/28/2023, 10:04 AM

## 2023-06-28 NOTE — Progress Notes (Signed)
[facility]IT[facility]  CONSULT SERVICE NOTE   Spoke to our admissions team, reviewed the chart. This is a 89 yo male with a new cerebral infarct involving the left corpus striatum. He has right hemiparesis, dysarthria as a result. Pt with baseline memory loss and depression as well as hx of TAVR for AS and CAD s/p stenting. Pt on ASA and Brilinta for stroke prophylaxis. Blood pressure under reasonable control. He is requiring mod assist for sit to stand transfers and is walking min assist 27' with RW, shuffling gait, drifting right. Wife is at home who can only provide supervision.    Pt would benefit from comprehensive, interdisciplinary rehab to maximize functional gains in the areas of cognition/communication, mobility, and self-care so that he is able to return home to his wife who can only provide supervision-level assistance..   Rehab admissions coordinator has started an expedited appeal.    Ranelle Oyster, MD, [facility] Physical Medicine & Rehabilitation Medical Director Rehabilitation Services 06/28/2023

## 2023-06-28 NOTE — Care Management Important Message (Signed)
Important Message  Patient Details  Name: Michael Kidd MRN: [id number] Date of Birth: [birth year]   Medicare Important Message Given:  Yes     Dorena Bodo 06/28/2023, 3:43 PM

## 2023-06-29 DIAGNOSIS — I639 Cerebral infarction, unspecified: Secondary | ICD-10-CM | POA: Diagnosis not present

## 2023-06-29 MED ORDER — CALCIUM CARBONATE ANTACID 500 MG PO CHEW
1.0000 | CHEWABLE_TABLET | Freq: Three times a day (TID) | ORAL | Status: DC
  Administered 2023-06-29 – 2023-07-01 (×7): 200 mg via ORAL
  Filled 2023-06-29 (×7): qty 1

## 2023-06-29 NOTE — Progress Notes (Signed)
06/29/23 0924  SLP Visit Information  SLP Received On 06/29/23  General Information  HPI 89 yo male adm to [facility] with slurred speech, aphasia, right facial asymmetry and right sided weakness. Brain imaging showed Small area of acute ischemia of the left corpus striatum. No hemorrhage or mass effect. Old right parietal and occipital infarcts. Old small vessel infarcts of the basal ganglia, cerebellum and corona radiata. H/O CVA 2020 and 2024 Jan. Pt reports he lives with family and they manage all of his bills, medications, etc.  Speech treatment indicated to facilitate pt's basic communication.  Treatment Provided  Treatment provided Cognitive-Linquistic (see progress note)  Patient seen to address language and dysarthria goals.  He was greeted in his chair finishing his breakfast while looking through his condiments.  He independently requested more "sugar" for his grits - and recalled that he requested the sugar within 2 minutes with direct question cue.  Today pt became tearful stating he wanted his "mom" - after discussion - realized he was referring to his wife. Pt able to state her name independently - but unclear to her age *99, 31, 62?.    SLP wrote her phone number on paper for him - but he advised he did not want to call her as she "sleeps late".  In addition, he stated he "doesn't like the numbers on the phone" indicating he needs help to call.    There were a few episodes of rapid deep breathing (3 breaths each occurrence) during session - not immediately preceding or following tearfulness and pt touching his left forehead stating he just "feels bad".  He denies discomfort - but was noted to eructate = holding his chest.  Further discussion revealed that he could use some Tums.  His communication is better for basic needs than during initial evaluation.  Continued with 2 episodes of having difficulty articulating his needs - with discernible 2-3 words while holding his hand to his mouth.     He can't use the phone but will want to call his wife later *she sleeps late*.  Requested staff to help him to call his wife later via secure chat to RN. SLP wrote wife's name and phone number on paper and attached it to the window for pt and staff.  Pt independently stated that his wife "does not want to talk to him when he is sick".  Provided emotional support to pt re: his current circumstances and added that his wife likely needs rest also as she is elderly.    Pt has made good progress with communicating his needs via improved expressive language and speech intelligibility.  He does however appear to get distracted easily and thus his attention impairs his ability to answer factual yes/no questions related to his meal requiring max cues for correct responses.   Offered to request spiritual consult for pt for emotional needs.    Pt independently stated "You're nice" to this therapist at the end of the session.  No family present to establish baseline and pt reports wife sleeps late, thus did not call.  Continue SLP.   Pain Assessment  Pain Assessment No/denies pain  PMSV Trial  Intelligibility Intelligible  Word 75-100% accurate  Sentence 75-100% accurate  Conversation Not tested  SLP - End of Session  Patient left in chair;with call bell/phone within reach;with chair alarm set  Nurse Communication Cognitive/Linguistic strategies reviewed;Other (comment)  Assessment / Recommendations / Plan  Plan Continue with current plan of care  General Recommendations  Oral Care Recommendations Oral care BID  Follow Up Recommendations  (consider AIR)  SLP Visit Diagnosis Dysarthria and anarthria (R47.1);Aphasia (R47.01)  SLP Time Calculation  SLP Start Time (ACUTE ONLY) 0845  SLP Stop Time (ACUTE ONLY) 1610  SLP Time Calculation (min) (ACUTE ONLY) 36 min  SLP Evaluations  $ SLP Speech Visit 1 Visit  SLP Evaluations  $Speech Treatment for Individual 1 Procedure   Michael Infante, MS [facility] SLP Acute  Rehab Services Office [phone]

## 2023-06-29 NOTE — Progress Notes (Signed)
Physical Therapy Treatment  Patient Details Name: Michael Kidd MRN: [id number] DOB: Jun 22, 1930 Today's Date: 06/29/2023   History of Present Illness Pt is a 89 y/o male presenting 7/17 with slurred speech, facial droop and R sided deficits. MRI brain showed small area of acute ischemia of L corpus striatum. PMH: HTN, CVA, depression, BPH, aortic stenosis status post TAVR, CAD s/p stenting.    PT Comments  Pt progressing towards physical therapy goals. Focus of session was improving tolerance for functional activity, and pt was able to participate in 2 bouts of ambulation with seated rest break in between. Pt successfully had a BM and was able to power up to full stand from the low toilet height with min assist. Pt emotional and tearful at times, stating "My days are limited, I can't do anything anymore." Pt reports missing his wife and wants to be able to take care of his yard again. Continue to feel this patient will benefit from intensive, multidisciplinary rehab to maximize functional independence and safety, and facilitate return home with family support. Will continue to follow.     Assistance Recommended at Discharge Frequent or constant Supervision/Assistance  If plan is discharge home, recommend the following:  Can travel by private vehicle    A lot of help with walking and/or transfers;A lot of help with bathing/dressing/bathroom;Assistance with cooking/housework;Direct supervision/assist for medications management;Direct supervision/assist for financial management;Assist for transportation;Help with stairs or ramp for entrance      Equipment Recommendations  None recommended by PT (TBD next venue)    Recommendations for Other Services Rehab consult     Precautions / Restrictions Precautions Precautions: Fall Precaution Comments: aphasia Restrictions Weight Bearing Restrictions: No     Mobility  Bed Mobility Overal bed mobility: Needs Assistance Bed Mobility: Supine to  Sit     Supine to sit: HOB elevated, Min assist     General bed mobility comments: in chair on entry    Transfers Overall transfer level: Needs assistance Equipment used: Rolling walker (2 wheels) Transfers: Sit to/from Stand Sit to Stand: Mod assist, Min assist           General transfer comment: Mod assist from recliner after sitting for a few hours. Min assist from the low toilet however with big pull up from safety bar on the right. VC's for hand placement on seated surface for safety. Pt likes to pull from walker.    Ambulation/Gait Ambulation/Gait assistance: Min assist Gait Distance (Feet): 40 Feet (x2) Assistive device: Rolling walker (2 wheels) Gait Pattern/deviations: Step-through pattern, Decreased stride length, Decreased weight shift to right, Shuffle, Drifts right/left, Trunk flexed Gait velocity: Decreased Gait velocity interpretation: <1.31 ft/sec, indicative of household ambulator   General Gait Details: Pt ambulated 70' x2 with seated rest break in between bouts. Grossly min guard for balance but min assist for walker management, especially during turns.   Stairs             Wheelchair Mobility     Tilt Bed    Modified Rankin (Stroke Patients Only) Modified Rankin (Stroke Patients Only) Pre-Morbid Rankin Score: Slight disability Modified Rankin: Moderately severe disability     Balance Overall balance assessment: Needs assistance Sitting-balance support: No upper extremity supported, Feet supported Sitting balance-Leahy Scale: Fair     Standing balance support: During functional activity, Reliant on assistive device for balance, Single extremity supported Standing balance-Leahy Scale: Poor Standing balance comment: external support if standing unsupported at sink, BUE for mobility  Cognition Arousal/Alertness: Awake/alert Behavior During Therapy: WFL for tasks assessed/performed Overall Cognitive  Status: Impaired/Different from baseline Area of Impairment: Orientation, Attention, Memory, Awareness, Problem solving, Safety/judgement, Following commands                 Orientation Level: Disoriented to, Time Current Attention Level: Sustained Memory: Decreased short-term memory Following Commands: Follows one step commands consistently, Follows one step commands with increased time Safety/Judgement: Decreased awareness of safety, Decreased awareness of deficits Awareness: Intellectual, Emergent Problem Solving: Slow processing, Decreased initiation, Difficulty sequencing, Requires verbal cues          Exercises      General Comments        Pertinent Vitals/Pain Pain Assessment Pain Assessment: Faces Faces Pain Scale: Hurts little more Pain Location: abdomen/back with sit>stand. Pain Descriptors / Indicators: Grimacing, Guarding Pain Intervention(s): Limited activity within patient's tolerance, Monitored during session, Repositioned    Home Living                          Prior Function            PT Goals (current goals can now be found in the care plan section) Acute Rehab PT Goals Patient Stated Goal: Go home PT Goal Formulation: Patient unable to participate in goal setting Time For Goal Achievement: 07/08/23 Potential to Achieve Goals: Good Progress towards PT goals: Progressing toward goals    Frequency    Min 4X/week      PT Plan Current plan remains appropriate    Co-evaluation              AM-PAC PT "6 Clicks" Mobility   Outcome Measure  Help needed turning from your back to your side while in a flat bed without using bedrails?: A Little Help needed moving from lying on your back to sitting on the side of a flat bed without using bedrails?: A Little Help needed moving to and from a bed to a chair (including a wheelchair)?: A Little Help needed standing up from a chair using your arms (e.g., wheelchair or bedside  chair)?: A Little Help needed to walk in hospital room?: A Little Help needed climbing 3-5 steps with a railing? : A Lot 6 Click Score: 17    End of Session Equipment Utilized During Treatment: Gait belt Activity Tolerance: Patient tolerated treatment well Patient left: in chair;with call bell/phone within reach;with chair alarm set Nurse Communication: Mobility status PT Visit Diagnosis: Unsteadiness on feet (R26.81);Other abnormalities of gait and mobility (R26.89);Muscle weakness (generalized) (M62.81);Difficulty in walking, not elsewhere classified (R26.2);Other symptoms and signs involving the nervous system (R29.898)     Time: 1345-1425 PT Time Calculation (min) (ACUTE ONLY): 40 min  Charges:    $Gait Training: 38-52 mins PT General Charges $$ ACUTE PT VISIT: 1 Visit                     Conni Slipper, PT, DPT Acute Rehabilitation Services Secure Chat Preferred Office: [phone]    Marylynn Pearson 06/29/2023, 2:59 PM

## 2023-06-29 NOTE — Progress Notes (Signed)
Occupational Therapy Treatment Patient Details Name: Michael Kidd MRN: [id number] DOB: 21-Mar-1930 Today's Date: 06/29/2023   History of present illness Pt is a 89 y/o male presenting 7/17 with slurred speech, facial droop and R sided deficits. MRI brain showed small area of acute ischemia of L corpus striatum. PMH: HTN, HLD, GERD, CVA, depression, BPH, aortic stenosis status post TAVR, CAD s/p stenting.   OT comments  Pt making incremental progress towards OT goals. Pt continues to require up to Mod A to boost up from lower surfaces and hands on assist for balance during mobility. Focused session on trail making, sequencing and general awareness during ADLs standing at sink. Pt awareness improves with visual cues and benefits from verbal prompting to initiate problem solving/sequencing to next step with increased time. Continue to feel pt would progress well to a Supervision level with intensive rehab services at DC.   Recommendations for follow up therapy are one component of a multi-disciplinary discharge planning process, led by the attending physician.  Recommendations may be updated based on patient status, additional functional criteria and insurance authorization.    Assistance Recommended at Discharge Frequent or constant Supervision/Assistance  Patient can return home with the following  A little help with walking and/or transfers;A lot of help with bathing/dressing/bathroom;Direct supervision/assist for medications management;Direct supervision/assist for financial management   Equipment Recommendations  BSC/3in1    Recommendations for Other Services Rehab consult    Precautions / Restrictions Precautions Precautions: Fall Restrictions Weight Bearing Restrictions: No       Mobility Bed Mobility               General bed mobility comments: in chair on entry    Transfers Overall transfer level: Needs assistance Equipment used: Rolling walker (2 wheels) Transfers:  Sit to/from Stand Sit to Stand: Mod assist           General transfer comment: Mod A to stand from recliner with difficulty clearing bottom     Balance Overall balance assessment: Needs assistance Sitting-balance support: No upper extremity supported, Feet supported Sitting balance-Leahy Scale: Fair     Standing balance support: During functional activity, Reliant on assistive device for balance, Single extremity supported Standing balance-Leahy Scale: Poor Standing balance comment: external support if standing unsupported at sink, BUE for mobility                           ADL either performed or assessed with clinical judgement   ADL Overall ADL's : Needs assistance/impaired Eating/Feeding: Modified independent;Sitting   Grooming: Minimal assistance;Standing;Oral care Grooming Details (indicate cue type and reason): shaving at sink with cues needed to use water and shaving cream. up to Min A for balance when unsupported or moving dynamically with minor posterior LOB. When asked if anything else needed to be done at this in AM, pt reports denture care with pt able to perform with increased time and prompting to problem solve the next steps                             Functional mobility during ADLs: Minimal assistance;Rolling walker (2 wheels);Cueing for sequencing;Cueing for safety General ADL Comments: Continues to need cueing for problem solving, general awareness and safety with hands on assist needed to use typically familiar DME. Pt reports "i dont know" when asked what he wanted for breakfast and when asked what he liked in his coffee. On delivery  of breakfast tray, pt reports "wheres the coffee", able to locate and request creamer automatically    Extremity/Trunk Assessment Upper Extremity Assessment Upper Extremity Assessment: Generalized weakness   Lower Extremity Assessment Lower Extremity Assessment: Defer to PT evaluation        Vision    Vision Assessment?: No apparent visual deficits   Perception     Praxis      Cognition Arousal/Alertness: Awake/alert Behavior During Therapy: WFL for tasks assessed/performed Overall Cognitive Status: Impaired/Different from baseline Area of Impairment: Orientation, Attention, Memory, Awareness, Problem solving, Safety/judgement, Following commands                 Orientation Level: Disoriented to, Time Current Attention Level: Sustained Memory: Decreased short-term memory Following Commands: Follows one step commands consistently, Follows one step commands with increased time Safety/Judgement: Decreased awareness of safety, Decreased awareness of deficits Awareness: Intellectual, Emergent Problem Solving: Slow processing, Decreased initiation, Difficulty sequencing, Requires verbal cues General Comments: (from prior note: per grandson, pt with memory impairments (sometimes will forget grandson's name if he has not been by in a couple of days). pt reports "I've never had a stroke before" in relation to disbelief being in hospital. able to show some emergent awareness when asked what other tasks he may need to complete at sink the morning but still needs cues for sequencing and problem solving - pt attempting to shave with dry razor - cued to use water and shaving cream to prevent injury        Exercises      Shoulder Instructions       General Comments      Pertinent Vitals/ Pain       Pain Assessment Pain Assessment: No/denies pain  Home Living                                          Prior Functioning/Environment              Frequency  Min 1X/week        Progress Toward Goals  OT Goals(current goals can now be found in the care plan section)  Progress towards OT goals: Progressing toward goals  Acute Rehab OT Goals Patient Stated Goal: have some coffee, see my wife OT Goal Formulation: With patient/family Time For Goal  Achievement: 07/08/23 Potential to Achieve Goals: Good ADL Goals Pt Will Perform Lower Body Bathing: with supervision;sit to/from stand Pt Will Perform Lower Body Dressing: with supervision;sit to/from stand Pt Will Transfer to Toilet: with supervision;ambulating Additional ADL Goal #1: Pt to complete 3 step trail making task with no more than min verbal cues  Plan Discharge plan remains appropriate;Frequency remains appropriate    Co-evaluation                 AM-PAC OT "6 Clicks" Daily Activity     Outcome Measure   Help from another person eating meals?: None Help from another person taking care of personal grooming?: A Little Help from another person toileting, which includes using toliet, bedpan, or urinal?: A Lot Help from another person bathing (including washing, rinsing, drying)?: A Lot Help from another person to put on and taking off regular upper body clothing?: A Little Help from another person to put on and taking off regular lower body clothing?: A Lot 6 Click Score: 16    End of Session Equipment Utilized During Treatment:  Rolling walker (2 wheels)  OT Visit Diagnosis: Other abnormalities of gait and mobility (R26.89);Muscle weakness (generalized) (M62.81);Other symptoms and signs involving cognitive function   Activity Tolerance Patient tolerated treatment well   Patient Left in chair;with call bell/phone within reach;with chair alarm set   Nurse Communication Mobility status;Other (comment) (discussed with NT)        Time: 1610-9604 OT Time Calculation (min): 22 min  Charges: OT General Charges $OT Visit: 1 Visit OT Treatments $Self Care/Home Management : 8-22 mins  Bradd Canary, OTR/L Acute Rehab Services Office: [phone]   Lorre Munroe 06/29/2023, 8:51 AM

## 2023-06-29 NOTE — Progress Notes (Signed)
Inpatient Rehab Admissions Coordinator:   Awaiting determination on expedited appeal with [facility].   Estill Dooms, PT, DPT Admissions Coordinator [phone] 06/29/23  10:51 AM

## 2023-06-29 NOTE — Progress Notes (Signed)
PROGRESS NOTE    Michael Kidd  YQM:[id number] DOB: [birth year] DOA: 06/23/2023 PCP: Barron Alvine, MD    Brief Narrative:  89 year old with history of hypertension, hyperlipidemia, GERD, depression, BPH, status post TAVR, CAD status post stent presents from home with right-sided weakness and facial droop.  He was found to have acute stroke.  Waiting for rehab.   Assessment & Plan:   Acute left MCA territory stroke: Clinical findings, woke up confused in the afternoon, slow to respond, garbled speech and worsening of his facial droop. CT head findings, new asymmetric hypoattenuation in the posterior left internal capsule MRI of the brain, acute ischemia of the left corpus stratum, old right parietal and occipital infarcts CT angiogram head and neck, no large vessel occlusion 2D echocardiogram, normal ejection fraction, grade 1 diastolic dysfunction, no intracardiac thrombus. Antiplatelet therapy, on Plavix 75 mg daily before admission.  Now on aspirin and Brilinta for 4 weeks then aspirin alone. LDL 50, Lipitor 80 mg daily Hemoglobin A1c, 5.8.  No indication for treatment. DVT prophylaxis, Lovenox subcu. Therapy recommendations, acute inpatient rehab.  Chronic medical issues including Essential hypertension, lisinopril Hyperlipidemia, Lipitor as above Memory impairment, on Namenda History of coronary artery disease, on aspirin Plavix and statin.  Plavix substituted with Brilinta Aortic stenosis status post TAVR: Stable. Hypokalemia: Stable.  Replaced.     DVT prophylaxis: enoxaparin (LOVENOX) injection 40 mg Start: 06/24/23 1445   Code Status: Full code Family Communication: None Disposition Plan: Status is: Inpatient Remains inpatient appropriate because: Waiting for acute inpatient rehab     Consultants:  Neurology Procedures:  None  Antimicrobials:  None   Subjective: Seen and examined.  Learning to shave with occupational therapist.  Denies any  complaints.  Objective: Vitals:   06/29/23 0006 06/29/23 0421 06/29/23 0756 06/29/23 1056  BP: 124/76 115/82 136/68 128/80  Pulse: 72 62 65 61  Resp: 16 16 16 18   Temp: 97.9 F (36.6 C) 98 F (36.7 C) 98.1 F (36.7 C) 97.9 F (36.6 C)  TempSrc: Oral  Oral Oral  SpO2: 92% 91% 93% 95%  Weight:      Height:        Intake/Output Summary (Last 24 hours) at 06/29/2023 1109 Last data filed at 06/29/2023 0759 Gross per 24 hour  Intake 0 ml  Output 885 ml  Net -885 ml   Filed Weights   06/23/23 1600  Weight: 95 kg    Examination:  General exam: Appears calm and comfortable  Respiratory system: Clear to auscultation. Respiratory effort normal. Cardiovascular system: S1 & S2 heard, RRR.  Central nervous system: Alert and oriented.  Right-sided facial droop.   Speech is clear.  Very hard of hearing.   He has mild right-sided motor weakness 4/5.    Data Reviewed: I have personally reviewed following labs and imaging studies  CBC: Recent Labs  Lab 06/23/23 1646 06/23/23 1648 06/25/23 0312  WBC  --  6.9 7.1  NEUTROABS  --  3.6  --   HGB 12.6* 13.1 13.8  HCT 37.0* 39.7 40.0  MCV  --  94.1 92.2  PLT  --  210 212   Basic Metabolic Panel: Recent Labs  Lab 06/23/23 1646 06/23/23 1648 06/25/23 0312 06/27/23 0415  NA 143 142 139 140  K 3.5 3.4* 3.1* 3.9  CL 106 105 105 112*  CO2  --  24 24 21*  GLUCOSE 132* 136* 116* 116*  BUN 17 16 14 21   CREATININE 1.00 1.01 1.03 1.05  CALCIUM  --  8.8* 8.3* 8.2*  MG  --   --  2.2  --    GFR: Estimated Creatinine Clearance: 55 mL/min (by C-G formula based on SCr of 1.05 mg/dL). Liver Function Tests: Recent Labs  Lab 06/23/23 1648  AST 30  ALT 26  ALKPHOS 89  BILITOT 0.6  PROT 6.0*  ALBUMIN 3.5   No results for input(s): "LIPASE", "AMYLASE" in the last 168 hours. No results for input(s): "AMMONIA" in the last 168 hours. Coagulation Profile: Recent Labs  Lab 06/23/23 1648  INR 1.2   Cardiac Enzymes: No results  for input(s): "CKTOTAL", "CKMB", "CKMBINDEX", "TROPONINI" in the last 168 hours. BNP (last 3 results) No results for input(s): "PROBNP" in the last 8760 hours. HbA1C: No results for input(s): "HGBA1C" in the last 72 hours. CBG: Recent Labs  Lab 06/23/23 1642 06/25/23 1205  GLUCAP 127* 134*   Lipid Profile: No results for input(s): "CHOL", "HDL", "LDLCALC", "TRIG", "CHOLHDL", "LDLDIRECT" in the last 72 hours. Thyroid Function Tests: No results for input(s): "TSH", "T4TOTAL", "FREET4", "T3FREE", "THYROIDAB" in the last 72 hours. Anemia Panel: No results for input(s): "VITAMINB12", "FOLATE", "FERRITIN", "TIBC", "IRON", "RETICCTPCT" in the last 72 hours. Sepsis Labs: No results for input(s): "PROCALCITON", "LATICACIDVEN" in the last 168 hours.  No results found for this or any previous visit (from the past 240 hour(s)).       Radiology Studies: No results found.      Scheduled Meds:  amLODipine  5 mg Oral Daily   aspirin EC  81 mg Oral Daily   atorvastatin  80 mg Oral QPM   calcium carbonate  1 tablet Oral TID WC   docusate sodium  100 mg Oral BID   enoxaparin (LOVENOX) injection  40 mg Subcutaneous Q24H   lisinopril  20 mg Oral Daily   memantine  10 mg Oral BID   polyethylene glycol  17 g Oral Daily   ticagrelor  90 mg Oral BID   Continuous Infusions:   LOS: 5 days    Time spent: 35 minutes    Dorcas Carrow, MD [facility]

## 2023-06-29 NOTE — Plan of Care (Signed)

## 2023-06-30 DIAGNOSIS — I639 Cerebral infarction, unspecified: Secondary | ICD-10-CM | POA: Diagnosis not present

## 2023-06-30 NOTE — Progress Notes (Signed)
Inpatient Rehab Admissions Coordinator:   Received a call from expedited appeals dept requesting AOR form (sent with original appeal on 7/22).  Re-faxed to [phone].  Will await determination.   Estill Dooms, PT, DPT Admissions Coordinator [phone] 06/30/23  11:56 AM

## 2023-06-30 NOTE — Plan of Care (Signed)
  Problem: Education: Goal: Knowledge of disease or condition will improve Outcome: Progressing Goal: Knowledge of secondary prevention will improve (MUST DOCUMENT ALL) Outcome: Progressing   

## 2023-06-30 NOTE — Progress Notes (Signed)
PROGRESS NOTE    Michael RAMPY  Kidd:[id number] DOB: Jul 31, 1930 DOA: 06/23/2023 PCP: Barron Alvine, MD    Brief Narrative:  89 year old with history of hypertension, hyperlipidemia, GERD, depression, BPH, status post TAVR, CAD status post stent presents from home with right-sided weakness and facial droop.  He was found to have acute stroke.  Waiting for rehab.   Assessment & Plan:   Acute left MCA territory stroke: Clinical findings, woke up confused in the afternoon, slow to respond, garbled speech and worsening of his facial droop. CT head findings, new asymmetric hypoattenuation in the posterior left internal capsule MRI of the brain, acute ischemia of the left corpus stratum, old right parietal and occipital infarcts CT angiogram head and neck, no large vessel occlusion 2D echocardiogram, normal ejection fraction, grade 1 diastolic dysfunction, no intracardiac thrombus. Antiplatelet therapy, on Plavix 75 mg daily before admission.  Now on aspirin and Brilinta for 4 weeks then aspirin alone. LDL 50, Lipitor 80 mg daily Hemoglobin A1c, 5.8.  No indication for treatment. DVT prophylaxis, Lovenox subcu. Therapy recommendations, acute inpatient rehab.  Chronic medical issues including Essential hypertension, lisinopril Hyperlipidemia, Lipitor as above Memory impairment, on Namenda History of coronary artery disease, on aspirin Plavix and statin.  Plavix substituted with Brilinta Aortic stenosis status post TAVR: Stable. Hypokalemia: Stable.  Replaced.     DVT prophylaxis: enoxaparin (LOVENOX) injection 40 mg Start: 06/24/23 1445   Code Status: Full code Family Communication: wife called and updated  Disposition Plan: Status is: Inpatient Remains inpatient appropriate because: Waiting for insurance approval to go to inpatient rehab     Consultants:  Neurology Procedures:  None  Antimicrobials:  None   Subjective: Seen and examined.  Tearful.  He is asking  whether I can send him home.  Objective: Vitals:   06/29/23 1947 06/30/23 0014 06/30/23 0351 06/30/23 1111  BP: 131/83 114/70 (!) 153/76 125/68  Pulse: 78 60 66 83  Resp: 18 17 17 16   Temp: 98.1 F (36.7 C) 97.6 F (36.4 C) (!) 97.5 F (36.4 C) 98 F (36.7 C)  TempSrc: Oral Oral Oral   SpO2: 96% 92% (!) 88% 98%  Weight:      Height:        Intake/Output Summary (Last 24 hours) at 06/30/2023 1328 Last data filed at 06/30/2023 0600 Gross per 24 hour  Intake 400 ml  Output 630 ml  Net -230 ml   Filed Weights   06/23/23 1600  Weight: 95 kg    Examination:  General exam: Appears calm and comfortable  Respiratory system: Clear to auscultation. Respiratory effort normal. Cardiovascular system: S1 & S2 heard, RRR.  Central nervous system: Alert and oriented.  Right-sided facial droop.   Speech is clear.  Very hard of hearing.   He has mild right-sided motor weakness 4/5. He has chronic weakness on the left side     Data Reviewed: I have personally reviewed following labs and imaging studies  CBC: Recent Labs  Lab 06/23/23 1646 06/23/23 1648 06/25/23 0312  WBC  --  6.9 7.1  NEUTROABS  --  3.6  --   HGB 12.6* 13.1 13.8  HCT 37.0* 39.7 40.0  MCV  --  94.1 92.2  PLT  --  210 212   Basic Metabolic Panel: Recent Labs  Lab 06/23/23 1646 06/23/23 1648 06/25/23 0312 06/27/23 0415  NA 143 142 139 140  K 3.5 3.4* 3.1* 3.9  CL 106 105 105 112*  CO2  --  24 24  21*  GLUCOSE 132* 136* 116* 116*  BUN 17 16 14 21   CREATININE 1.00 1.01 1.03 1.05  CALCIUM  --  8.8* 8.3* 8.2*  MG  --   --  2.2  --    GFR: Estimated Creatinine Clearance: 55 mL/min (by C-G formula based on SCr of 1.05 mg/dL). Liver Function Tests: Recent Labs  Lab 06/23/23 1648  AST 30  ALT 26  ALKPHOS 89  BILITOT 0.6  PROT 6.0*  ALBUMIN 3.5   No results for input(s): "LIPASE", "AMYLASE" in the last 168 hours. No results for input(s): "AMMONIA" in the last 168 hours. Coagulation  Profile: Recent Labs  Lab 06/23/23 1648  INR 1.2   Cardiac Enzymes: No results for input(s): "CKTOTAL", "CKMB", "CKMBINDEX", "TROPONINI" in the last 168 hours. BNP (last 3 results) No results for input(s): "PROBNP" in the last 8760 hours. HbA1C: No results for input(s): "HGBA1C" in the last 72 hours. CBG: Recent Labs  Lab 06/23/23 1642 06/25/23 1205  GLUCAP 127* 134*   Lipid Profile: No results for input(s): "CHOL", "HDL", "LDLCALC", "TRIG", "CHOLHDL", "LDLDIRECT" in the last 72 hours. Thyroid Function Tests: No results for input(s): "TSH", "T4TOTAL", "FREET4", "T3FREE", "THYROIDAB" in the last 72 hours. Anemia Panel: No results for input(s): "VITAMINB12", "FOLATE", "FERRITIN", "TIBC", "IRON", "RETICCTPCT" in the last 72 hours. Sepsis Labs: No results for input(s): "PROCALCITON", "LATICACIDVEN" in the last 168 hours.  No results found for this or any previous visit (from the past 240 hour(s)).       Radiology Studies: No results found.      Scheduled Meds:  amLODipine  5 mg Oral Daily   aspirin EC  81 mg Oral Daily   atorvastatin  80 mg Oral QPM   calcium carbonate  1 tablet Oral TID WC   docusate sodium  100 mg Oral BID   enoxaparin (LOVENOX) injection  40 mg Subcutaneous Q24H   lisinopril  20 mg Oral Daily   memantine  10 mg Oral BID   polyethylene glycol  17 g Oral Daily   ticagrelor  90 mg Oral BID   Continuous Infusions:   LOS: 6 days    Time spent: 35 minutes    Dorcas Carrow, MD [facility]

## 2023-06-30 NOTE — H&P (Signed)
Physical Medicine and Rehabilitation Admission H&P    Chief Complaint  Patient presents with   Code Stroke  : HPI: Michael Kidd is a 89 year old right-handed male with history of hypertension, BPH, aortic stenosis status post TAVR 2021, CAD with stenting, hyperlipidemia, tobacco use, history of CVA January 2024 maintained on Plavix with workup completed at [facility] also has mild dementia on Namenda; depression.  Per chart review patient lives with spouse.  1 level at home.  Wife assist with ADLs.  Presented 06/23/2023 with right side side weakness and aphasia.  CT/MRI showed small area of acute ischemia of the left corpus stratum.  No hemorrhage or mass effect.  Old right parietal and occipital infarcts as well as old small vessel infarction of the basal ganglia, cerebellum and corona radiata.  CTA showed no large vessel occlusion or hemodynamically significant stenosis.  Admission chemistries unremarkable except potassium 3.4, glucose 136, hemoglobin A1c 5.7.  Echocardiogram with ejection fraction of 60 to 65% no wall motion abnormalities grade 1 diastolic dysfunction.  Neurology follow-up currently maintained on low-dose aspirin with Brilinta 90 mg twice daily.  Lovenox added for DVT prophylaxis.  Tolerating a regular consistency diet.  Therapy evaluations completed due to patient decreased functional mobility and right-sided weakness was admitted for a comprehensive rehab program.   Pt reports "feel terrible"- due to his weakness- his only concern is getting home.  He thinks his LBM was yesterday- he also appears to have been incontinent of bladder on 3W due to his stroke. Hard for nursing to get to him fast enough to be continent.     Review of Systems  Constitutional:  Negative for chills and fever.  HENT:  Negative for hearing loss.   Eyes:  Negative for blurred vision and double vision.  Respiratory:  Negative for cough, shortness of breath and wheezing.    Cardiovascular:  Positive for leg swelling. Negative for chest pain and palpitations.  Gastrointestinal:  Positive for constipation. Negative for heartburn, nausea and vomiting.  Genitourinary:  Positive for urgency. Negative for dysuria and hematuria.  Musculoskeletal:  Positive for joint pain and myalgias.  Skin:  Negative for rash.  Neurological:  Positive for dizziness, sensory change, speech change and weakness.  Psychiatric/Behavioral:  Positive for depression and memory loss.   All other systems reviewed and are negative.  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 PHQ-9 completed 02/26/14 PHQ-2 completed 02/14/14   Dizziness 03/12/2014   Dupuytren's contracture 07/15/2016   Dyspnea on exertion 08/15/2020   Dysthymia    Essential hypertension 03/12/2014   Flexor tendon bowstring 01/06/2016   Gastroesophageal reflux disease 08/18/2016   Hepatic steatosis 03/02/2014   History of CVA (cerebrovascular accident) 10/12/2019   HLD (hyperlipidemia) 02/14/2014   HTN (hypertension)    Late effect of cerebrovascular accident (CVA) 10/19/2019   Melanoma in situ of right upper extremity including shoulder (HCC)  08/07/2016   Formatting of this note might be different from the original. August 2017.  Dr. Chancy Milroy, [facility].   Murmur 05/23/2020   Myocardial infarction [facility])    Neuropathic pain, leg, bilateral 11/23/2017    Nonsustained ventricular tachycardia (HCC) 11/12/2020   Pure hypercholesterolemia 09/14/2018   Quadriceps weakness 09/01/2017   Renal cyst    Risk for falls 10/01/2016   S/P TAVR (transcatheter aortic valve replacement) 09/24/2020   Short of breath on exertion    per patient   Stroke [facility])    November   Symptomatic severe aortic stenosis with normal ejection fraction 08/15/2020   Past Surgical History:  Procedure Laterality Date   CATARACT EXTRACTION W/ INTRAOCULAR LENS  IMPLANT, BILATERAL     cataract repair     HERNIA REPAIR     unsure of date   LOOP RECORDER INSERTION N/A 08/28/2019   Procedure: LOOP RECORDER INSERTION;  Surgeon: Marinus Maw, MD;  Location: [facility] INVASIVE CV LAB;  Service: Cardiovascular;  Laterality: N/A;   PERCUTANEOUS CORONARY STENT INTERVENTION (PCI-S)     RIGHT/LEFT HEART CATH AND CORONARY ANGIOGRAPHY N/A 08/22/2020   Procedure: RIGHT/LEFT HEART CATH AND CORONARY ANGIOGRAPHY;  Surgeon: Marykay Lex, MD;  Location: [facility] INVASIVE CV LAB;  Service: Cardiovascular;  Laterality: N/A;   TEE WITHOUT CARDIOVERSION N/A 09/24/2020   Procedure: TRANSESOPHAGEAL ECHOCARDIOGRAM (TEE);  Surgeon: Tonny Bollman, MD;  Location: [facility] OR;  Service: Open Heart Surgery;  Laterality: N/A;   TRANSCATHETER AORTIC VALVE REPLACEMENT, TRANSFEMORAL N/A 09/24/2020   Procedure: TRANSCATHETER AORTIC VALVE REPLACEMENT, TRANSFEMORAL - USING EDWARDS SAPIEN 3 ULTRA  VALVE SIZE ;  Surgeon: Tonny Bollman, MD;  Location: [facility] OR;  Service: Open Heart Surgery;  Laterality: N/A;   Family History  Problem Relation Age of Onset   Diabetes Mother    Stroke Father    Social History:  reports that he has quit smoking. He has never used smokeless tobacco. He reports that he does not currently use alcohol. He reports that he does not use drugs. Allergies: No Known Allergies Medications Prior to Admission  Medication Sig Dispense Refill   acetaminophen (TYLENOL) 650 MG CR tablet Take 325 mg by mouth  every 8 (eight) hours as needed for pain.     albuterol (VENTOLIN HFA) 108 (90 Base) MCG/ACT inhaler Inhale 1 puff into the lungs every 6 (six) hours as needed for shortness of breath or wheezing.     atorvastatin (LIPITOR) 80 MG tablet Take 1 tablet by mouth every evening.     clopidogrel (PLAVIX) 75 MG tablet Take 1 tablet by mouth once daily 90 tablet 0   Cyanocobalamin (B-12 PO) Take 1 tablet by mouth every evening.     finasteride (PROSCAR) 5 MG tablet Take 5 mg by mouth every evening.     lisinopril (ZESTRIL) 20 MG tablet Take 1 tablet by mouth daily.     memantine (NAMENDA) 10 MG tablet Take 1 tablet (10 mg total) by mouth 2 (two) times daily. 60 tablet 3   donepezil (ARICEPT) 5 MG tablet Take 5 mg by mouth at bedtime. (Patient not taking: Reported on 06/24/2023)     venlafaxine XR (EFFEXOR-XR) 75 MG 24 hr capsule Take 75 mg by mouth daily with breakfast. (Patient not taking: Reported on 06/24/2023)        Home: Home Living Family/patient expects to be discharged to:: Private residence Living Arrangements: Spouse/significant other Available Help at Discharge: Family, Friend(s) Type of Home: House Home Layout: One level Bathroom  Shower/Tub: Sponge bathes at baseline Bathroom Toilet: Standard Home Equipment: Agricultural consultant (2 wheels), Other (comment) (lift chair) Additional Comments: Wife unable to provide physical assistance   Functional History: Prior Function Prior Level of Function : Patient poor historian/Family not available, Needs assist Mobility Comments: uses RW for mobility ADLs Comments: sponge bathing for a few years per grandson, able to dress self. wife does IADLs, neither drive so friends assist with groceries  Functional Status:  Mobility: Bed Mobility Overal bed mobility: Needs Assistance Bed Mobility: Supine to Sit Supine to sit: Min assist, Mod assist (more assist from flatter HOB to boost up via R elbow) General bed mobility comments: in chair on  entry Transfers Overall transfer level: Needs assistance Equipment used: Rolling walker (2 wheels) Transfers: Sit to/from Stand Sit to Stand: Mod assist, Min assist Bed to/from chair/wheelchair/BSC transfer type:: Step pivot Step pivot transfers: Min assist General transfer comment: cues for hand placement and mod assist to come forward fromt he bed after nap Ambulation/Gait Ambulation/Gait assistance: Min assist Gait Distance (Feet): 130 Feet (total, 4 stops in standing to regroup posture and proximity to the RW.  Noted more fatigue with each bout and degradation of gait pattern, R LE increasingly lagging behind and with worsening heel/toe ability) Assistive device: Rolling walker (2 wheels) Gait Pattern/deviations: Step-through pattern, Decreased stride length, Decreased weight shift to right, Shuffle, Drifts right/left, Trunk flexed General Gait Details: Improved distance, but needed more standing rest to make adjustments in attempt to improve pattern on the R LE and correct posture and use of the RW. Gait velocity: Decreased Gait velocity interpretation: <1.31 ft/sec, indicative of household ambulator    ADL: ADL Overall ADL's : Needs assistance/impaired Eating/Feeding: Modified independent, Sitting Grooming: Minimal assistance, Standing, Oral care Grooming Details (indicate cue type and reason): shaving at sink with cues needed to use water and shaving cream. up to Min A for balance when unsupported or moving dynamically with minor posterior LOB. When asked if anything else needed to be done at this in AM, pt reports denture care with pt able to perform with increased time and prompting to problem solve the next steps Upper Body Bathing: Minimal assistance, Sitting Lower Body Bathing: Moderate assistance, Sit to/from stand Upper Body Dressing : Minimal assistance, Sitting Lower Body Dressing: Moderate assistance, Sit to/from stand Toilet Transfer: Minimal assistance, Ambulation,  Rolling walker (2 wheels) Toileting- Clothing Manipulation and Hygiene: Modified independent, Bed level (using urinal only) Functional mobility during ADLs: Minimal assistance, Rolling walker (2 wheels), Cueing for sequencing, Cueing for safety General ADL Comments: Continues to need cueing for problem solving, general awareness and safety with hands on assist needed to use typically familiar DME. Pt reports "i dont know" when asked what he wanted for breakfast and when asked what he liked in his coffee. On delivery of breakfast tray, pt reports "wheres the coffee", able to locate and request creamer automatically  Cognition: Cognition Overall Cognitive Status: Impaired/Different from baseline Orientation Level: Oriented to person, Oriented to place, Disoriented to person, Disoriented to time Memory: Impaired Memory Impairment: Prospective memory (did not recall that he asked SLP to bring him coffee when eval was done) Problem Solving: Impaired Problem Solving Impairment: Functional basic Safety/Judgment: Impaired (pt states he wants to go home despite knowing he had a stroke and has weakness with speech and language deficits) Cognition Arousal/Alertness: Awake/alert Behavior During Therapy: WFL for tasks assessed/performed Overall Cognitive Status: Impaired/Different from baseline Area of Impairment: Orientation, Attention, Memory, Awareness, Problem solving, Safety/judgement, Following commands  Orientation Level: Disoriented to, Time Current Attention Level: Sustained Memory: Decreased short-term memory Following Commands: Follows one step commands consistently, Follows one step commands with increased time Safety/Judgement: Decreased awareness of safety, Decreased awareness of deficits Awareness: Intellectual, Emergent Problem Solving: Slow processing, Difficulty sequencing, Requires verbal cues General Comments: (from prior note: per grandson, pt with memory impairments (sometimes will  forget grandson's name if he has not been by in a couple of days). pt reports "I've never had a stroke before" in relation to disbelief being in hospital. able to show some emergent awareness when asked what other tasks he may need to complete at sink the morning but still needs cues for sequencing and problem solving - pt attempting to shave with dry razor - cued to use water and shaving cream to prevent injury  Physical Exam: Blood pressure 132/64, pulse 72, temperature 98.3 F (36.8 C), temperature source Oral, resp. rate 18, height 5\' 11"  (1.803 m), weight 95 kg, SpO2 96%. Physical Exam Vitals and nursing note reviewed.  Constitutional:      Comments: Pt awake, alert, but repetitive, almost supine in bed; NAD; appears sad  HENT:     Head: Normocephalic and atraumatic.     Comments: R facial droop R tongue deviation but not as severe as Tuesday    Nose: Nose normal.     Mouth/Throat:     Mouth: Mucous membranes are dry.     Pharynx: Oropharynx is clear. No oropharyngeal exudate.  Eyes:     General:        Right eye: No discharge.        Left eye: No discharge.     Comments: R eye lagged behind L eye with EOM's- but did complete movements No nystagmus seen  Cardiovascular:     Rate and Rhythm: Normal rate and regular rhythm.     Heart sounds: Normal heart sounds. No murmur heard.    No gallop.  Pulmonary:     Effort: Pulmonary effort is normal. No respiratory distress.     Breath sounds: Normal breath sounds. No wheezing, rhonchi or rales.  Abdominal:     General: There is no distension.     Palpations: Abdomen is soft.     Tenderness: There is no abdominal tenderness.     Comments: Somewhat hypoactive BS  Musculoskeletal:     Cervical back: Neck supple. No tenderness.     Comments: RUE- biceps 4+/5; Triceps 4/5; WE 4+/5; Grip 4=/5 and FA 3+/5 LUE 5-/5 in same muscles RLE- 4+/5 in HF, KE, KF, DF and PF LLE- 5-/5 in same muscles   Skin:    General: Skin is warm and dry.      Comments: L ear bandage IV looks OK  Neurological:     Comments: Patient is alert.  Makes eye contact with examiner.  Speech is dysarthric but intelligible.  Provides name but limited medical historian.  Follows commands. Somewhat dysarthric Intact to light touch in all 4 extremities and face Naming 3/3 objects Finished 2 different sentences/phrases Questionable mild inattention- gave me L hand to shake, offered L side initially on legs Some emotional lability- near tears 2 different times  Psychiatric:     Comments: Emotional lability noted     No results found for this or any previous visit (from the past 48 hour(s)). No results found.    Blood pressure 132/64, pulse 72, temperature 98.3 F (36.8 C), temperature source Oral, resp. rate 18, height 5\' 11"  (1.803 m), weight  95 kg, SpO2 96%.  Medical Problem List and Plan: 1. Functional deficits secondary to ischemic infarction left internal capsule as well as history of prior left brain infarcts  -patient may  shower  -ELOS/Goals: 10-14 days -supervision  Admit to CIR 2.  Antithrombotics: -DVT/anticoagulation:  Pharmaceutical: Lovenox  -antiplatelet therapy: Aspirin 81 mg daily and Brilinta 90 mg twice daily x 4 weeks then aspirin alone-since failed Plavix 3. Pain Management: Tylenol as needed 4. Mood/Behavior/Sleep: Namenda 10 mg twice daily  -antipsychotic agents: Provide emotional support 5. Neuropsych/cognition: This patient is not capable of making decisions on his own behalf. 6. Skin/Wound Care: Routine skin checks 7. Fluids/Electrolytes/Nutrition: Routine and helpful follow-up chemistries 8.  Hypertension.  Norvasc 5 mg daily, lisinopril 20 mg daily.  Monitor with increased mobility 9.  Hyperlipidemia.  Lipitor 10.  BPH.  Check PVR 11.  Aortic stenosis status post TAVR March 2021 as well as history of CAD with stenting.  Follow-up outpatient with Dr. Gypsy Balsam. 12. Hx of mild dementia- on Namenda 13. Urinary  urgency/frequency/incontinence- suggest timed voiding.      Mcarthur Rossetti Angiulli, PA-C 07/01/2023  I have personally performed a face to face diagnostic evaluation of this patient and formulated the key components of the plan.  Additionally, I have personally reviewed laboratory data, imaging studies, as well as relevant notes and concur with the physician assistant's documentation above.   The patient's status has not changed from the original H&P.  Any changes in documentation from the acute care chart have been noted above.

## 2023-06-30 NOTE — Progress Notes (Signed)
Physical Therapy Treatment Patient Details Name: Michael Kidd MRN: [id number] DOB: 17-Apr-1930 Today's Date: 06/30/2023   History of Present Illness Pt is a 89 y/o male presenting 7/17 with slurred speech, facial droop and R sided deficits. MRI brain showed small area of acute ischemia of L corpus striatum. PMH: HTN, CVA, depression, BPH, aortic stenosis status post TAVR, CAD s/p stenting.    PT Comments  Pt showing improvements toward goals, limited by both general and R side weaknesses witnessed as uncoordinated assist of bed mobility/transfers with R UE and uncoordinated/weak R LE during boost to stand and uncoordinated gait with r LE lag and degradation of heel/toe-toe off as he fatigued.      Assistance Recommended at Discharge Frequent or constant Supervision/Assistance  If plan is discharge home, recommend the following:  Can travel by private vehicle    A lot of help with walking and/or transfers;A lot of help with bathing/dressing/bathroom;Assistance with cooking/housework;Direct supervision/assist for medications management;Direct supervision/assist for financial management;Assist for transportation;Help with stairs or ramp for entrance      Equipment Recommendations  None recommended by PT (TBD next venue)    Recommendations for Other Services Rehab consult     Precautions / Restrictions Precautions Precautions: Fall Precaution Comments: aphasia Restrictions Weight Bearing Restrictions: No     Mobility  Bed Mobility Overal bed mobility: Needs Assistance Bed Mobility: Supine to Sit     Supine to sit: Min assist, Mod assist (more assist from flatter HOB to boost up via R elbow)          Transfers Overall transfer level: Needs assistance Equipment used: Rolling walker (2 wheels) Transfers: Sit to/from Stand Sit to Stand: Mod assist, Min assist           General transfer comment: cues for hand placement and mod assist to come forward fromt he bed after  nap    Ambulation/Gait Ambulation/Gait assistance: Min assist Gait Distance (Feet): 130 Feet (total, 4 stops in standing to regroup posture and proximity to the RW.  Noted more fatigue with each bout and degradation of gait pattern, R LE increasingly lagging behind and with worsening heel/toe ability) Assistive device: Rolling walker (2 wheels) Gait Pattern/deviations: Step-through pattern, Decreased stride length, Decreased weight shift to right, Shuffle, Drifts right/left, Trunk flexed Gait velocity: Decreased Gait velocity interpretation: <1.31 ft/sec, indicative of household ambulator   General Gait Details: Improved distance, but needed more standing rest to make adjustments in attempt to improve pattern on the R LE and correct posture and use of the RW.   Stairs             Wheelchair Mobility     Tilt Bed    Modified Rankin (Stroke Patients Only) Modified Rankin (Stroke Patients Only) Pre-Morbid Rankin Score: Slight disability Modified Rankin: Moderately severe disability     Balance Overall balance assessment: Needs assistance Sitting-balance support: No upper extremity supported, Feet supported Sitting balance-Leahy Scale: Fair     Standing balance support: During functional activity, Reliant on assistive device for balance, Single extremity supported Standing balance-Leahy Scale: Poor Standing balance comment: external support if standing unsupported at sink, BUE for mobility                            Cognition Arousal/Alertness: Awake/alert Behavior During Therapy: WFL for tasks assessed/performed Overall Cognitive Status: Impaired/Different from baseline Area of Impairment: Orientation, Attention, Memory, Awareness, Problem solving, Safety/judgement, Following commands  Current Attention Level: Sustained Memory: Decreased short-term memory Following Commands: Follows one step commands consistently, Follows one step  commands with increased time   Awareness: Intellectual, Emergent Problem Solving: Slow processing, Difficulty sequencing, Requires verbal cues          Exercises      General Comments        Pertinent Vitals/Pain Pain Assessment Pain Assessment: Faces Faces Pain Scale: No hurt Pain Intervention(s): Monitored during session    Home Living                          Prior Function            PT Goals (current goals can now be found in the care plan section) Acute Rehab PT Goals Patient Stated Goal: Go home PT Goal Formulation: Patient unable to participate in goal setting Time For Goal Achievement: 07/08/23 Potential to Achieve Goals: Good Progress towards PT goals: Progressing toward goals    Frequency    Min 1X/week      PT Plan Current plan remains appropriate    Co-evaluation              AM-PAC PT "6 Clicks" Mobility   Outcome Measure  Help needed turning from your back to your side while in a flat bed without using bedrails?: A Little Help needed moving from lying on your back to sitting on the side of a flat bed without using bedrails?: A Little Help needed moving to and from a bed to a chair (including a wheelchair)?: A Little Help needed standing up from a chair using your arms (e.g., wheelchair or bedside chair)?: A Little Help needed to walk in hospital room?: A Little Help needed climbing 3-5 steps with a railing? : A Lot 6 Click Score: 17    End of Session Equipment Utilized During Treatment: Gait belt Activity Tolerance: Patient tolerated treatment well Patient left: with call bell/phone within reach;in bed;with bed alarm set Nurse Communication: Mobility status PT Visit Diagnosis: Unsteadiness on feet (R26.81);Muscle weakness (generalized) (M62.81);Difficulty in walking, not elsewhere classified (R26.2);Other symptoms and signs involving the nervous system (R29.898)     Time: 4098-1191 PT Time Calculation (min) (ACUTE  ONLY): 15 min  Charges:    $Gait Training: 8-22 mins PT General Charges $$ ACUTE PT VISIT: 1 Visit                     06/30/2023  Jacinto Halim., PT Acute Rehabilitation Services [phone]  (office)   [name] 06/30/2023, 5:18 PM

## 2023-06-30 NOTE — TOC Progression Note (Signed)
Transition of Care [facility]) - Progression Note    Patient Details  Name: Michael Kidd MRN: [id number] Date of Birth: 1930/09/14  [facility] CM/SW Contact  Kermit Balo, RN Phone Number: 06/30/2023, 2:43 PM  Clinical Narrative:    Awaiting appeal for CIR.   TOC following.   Expected Discharge Plan: IP Rehab Facility    Expected Discharge Plan and Services                                               Social Determinants of Health (SDOH) Interventions SDOH Screenings   Food Insecurity: No Food Insecurity (06/23/2023)  Housing: Low Risk  (06/23/2023)  Transportation Needs: No Transportation Needs (06/23/2023)  Utilities: Not At Risk (06/23/2023)  Tobacco Use: Medium Risk (06/23/2023)    Readmission Risk Interventions     No data to display

## 2023-06-30 NOTE — Progress Notes (Signed)
Carelink Summary Report / Loop Recorder 

## 2023-07-01 ENCOUNTER — Inpatient Hospital Stay (HOSPITAL_COMMUNITY)
Admission: RE | Admit: 2023-07-01 | Discharge: 2023-07-20 | DRG: 057 | Disposition: A | Discharge status: Skilled Nursing Facility | Payer: Medicare HMO | Source: Intra-hospital | Attending: Physical Medicine and Rehabilitation | Admitting: Physical Medicine and Rehabilitation

## 2023-07-01 ENCOUNTER — Other Ambulatory Visit: Payer: Self-pay

## 2023-07-01 ENCOUNTER — Encounter (HOSPITAL_COMMUNITY): Payer: Self-pay | Admitting: Physical Medicine and Rehabilitation

## 2023-07-01 DIAGNOSIS — Z953 Presence of xenogenic heart valve: Secondary | ICD-10-CM

## 2023-07-01 DIAGNOSIS — G5793 Unspecified mononeuropathy of bilateral lower limbs: Secondary | ICD-10-CM | POA: Diagnosis present

## 2023-07-01 DIAGNOSIS — R194 Change in bowel habit: Secondary | ICD-10-CM | POA: Diagnosis not present

## 2023-07-01 DIAGNOSIS — N39498 Other specified urinary incontinence: Secondary | ICD-10-CM | POA: Diagnosis present

## 2023-07-01 DIAGNOSIS — F0153 Vascular dementia, unspecified severity, with mood disturbance: Secondary | ICD-10-CM | POA: Diagnosis present

## 2023-07-01 DIAGNOSIS — M1990 Unspecified osteoarthritis, unspecified site: Secondary | ICD-10-CM | POA: Diagnosis present

## 2023-07-01 DIAGNOSIS — N401 Enlarged prostate with lower urinary tract symptoms: Secondary | ICD-10-CM | POA: Diagnosis present

## 2023-07-01 DIAGNOSIS — Z1152 Encounter for screening for COVID-19: Secondary | ICD-10-CM | POA: Diagnosis not present

## 2023-07-01 DIAGNOSIS — Z823 Family history of stroke: Secondary | ICD-10-CM

## 2023-07-01 DIAGNOSIS — I63412 Cerebral infarction due to embolism of left middle cerebral artery: Secondary | ICD-10-CM | POA: Diagnosis not present

## 2023-07-01 DIAGNOSIS — F03A3 Unspecified dementia, mild, with mood disturbance: Secondary | ICD-10-CM | POA: Diagnosis present

## 2023-07-01 DIAGNOSIS — Z833 Family history of diabetes mellitus: Secondary | ICD-10-CM | POA: Diagnosis not present

## 2023-07-01 DIAGNOSIS — G479 Sleep disorder, unspecified: Secondary | ICD-10-CM | POA: Diagnosis not present

## 2023-07-01 DIAGNOSIS — I69320 Aphasia following cerebral infarction: Secondary | ICD-10-CM

## 2023-07-01 DIAGNOSIS — Z87891 Personal history of nicotine dependence: Secondary | ICD-10-CM

## 2023-07-01 DIAGNOSIS — Z79899 Other long term (current) drug therapy: Secondary | ICD-10-CM | POA: Diagnosis not present

## 2023-07-01 DIAGNOSIS — I10 Essential (primary) hypertension: Secondary | ICD-10-CM | POA: Diagnosis present

## 2023-07-01 DIAGNOSIS — I2510 Atherosclerotic heart disease of native coronary artery without angina pectoris: Secondary | ICD-10-CM | POA: Diagnosis present

## 2023-07-01 DIAGNOSIS — I639 Cerebral infarction, unspecified: Secondary | ICD-10-CM | POA: Diagnosis not present

## 2023-07-01 DIAGNOSIS — R3915 Urgency of urination: Secondary | ICD-10-CM | POA: Diagnosis present

## 2023-07-01 DIAGNOSIS — I252 Old myocardial infarction: Secondary | ICD-10-CM

## 2023-07-01 DIAGNOSIS — R451 Restlessness and agitation: Secondary | ICD-10-CM | POA: Diagnosis not present

## 2023-07-01 DIAGNOSIS — I69351 Hemiplegia and hemiparesis following cerebral infarction affecting right dominant side: Secondary | ICD-10-CM | POA: Diagnosis present

## 2023-07-01 DIAGNOSIS — F03A0 Unspecified dementia, mild, without behavioral disturbance, psychotic disturbance, mood disturbance, and anxiety: Secondary | ICD-10-CM | POA: Diagnosis present

## 2023-07-01 DIAGNOSIS — Z955 Presence of coronary angioplasty implant and graft: Secondary | ICD-10-CM | POA: Diagnosis not present

## 2023-07-01 DIAGNOSIS — I951 Orthostatic hypotension: Secondary | ICD-10-CM | POA: Diagnosis not present

## 2023-07-01 DIAGNOSIS — N179 Acute kidney failure, unspecified: Secondary | ICD-10-CM | POA: Diagnosis present

## 2023-07-01 DIAGNOSIS — E7800 Pure hypercholesterolemia, unspecified: Secondary | ICD-10-CM | POA: Diagnosis present

## 2023-07-01 DIAGNOSIS — F322 Major depressive disorder, single episode, severe without psychotic features: Secondary | ICD-10-CM | POA: Diagnosis present

## 2023-07-01 DIAGNOSIS — K5900 Constipation, unspecified: Secondary | ICD-10-CM | POA: Diagnosis not present

## 2023-07-01 DIAGNOSIS — Z85820 Personal history of malignant melanoma of skin: Secondary | ICD-10-CM

## 2023-07-01 DIAGNOSIS — N400 Enlarged prostate without lower urinary tract symptoms: Secondary | ICD-10-CM | POA: Diagnosis present

## 2023-07-01 DIAGNOSIS — R350 Frequency of micturition: Secondary | ICD-10-CM | POA: Diagnosis present

## 2023-07-01 DIAGNOSIS — F32A Depression, unspecified: Secondary | ICD-10-CM | POA: Diagnosis not present

## 2023-07-01 DIAGNOSIS — I69322 Dysarthria following cerebral infarction: Secondary | ICD-10-CM

## 2023-07-01 DIAGNOSIS — R45851 Suicidal ideations: Secondary | ICD-10-CM | POA: Diagnosis not present

## 2023-07-01 DIAGNOSIS — I350 Nonrheumatic aortic (valve) stenosis: Secondary | ICD-10-CM | POA: Diagnosis present

## 2023-07-01 DIAGNOSIS — I6930 Unspecified sequelae of cerebral infarction: Secondary | ICD-10-CM | POA: Diagnosis not present

## 2023-07-01 LAB — CBC
HCT: 39.4 % (ref 39.0–52.0)
Hemoglobin: 13.4 g/dL (ref 13.0–17.0)
MCH: 31.6 pg (ref 26.0–34.0)
MCHC: 34 g/dL (ref 30.0–36.0)
MCV: 92.9 fL (ref 80.0–100.0)
Platelets: 252 10*3/uL (ref 150–400)
RBC: 4.24 MIL/uL (ref 4.22–5.81)
RDW: 14 % (ref 11.5–15.5)
WBC: 7.6 10*3/uL (ref 4.0–10.5)
nRBC: 0 % (ref 0.0–0.2)

## 2023-07-01 LAB — CREATININE, SERUM
Creatinine, Ser: 1.09 mg/dL (ref 0.61–1.24)
GFR, Estimated: >60 mL/min (ref >60)

## 2023-07-01 MED ORDER — POLYETHYLENE GLYCOL 3350 17 G PO PACK
17.0000 g | PACK | Freq: Every day | ORAL | Status: DC
  Administered 2023-07-02 – 2023-07-10 (×9): 17 g via ORAL
  Filled 2023-07-01 (×9): qty 1

## 2023-07-01 MED ORDER — ATORVASTATIN CALCIUM 80 MG PO TABS
80.0000 mg | ORAL_TABLET | Freq: Every evening | ORAL | Status: DC
  Administered 2023-07-01 – 2023-07-19 (×19): 80 mg via ORAL
  Filled 2023-07-01 (×19): qty 1

## 2023-07-01 MED ORDER — ACETAMINOPHEN 160 MG/5ML PO SOLN: 650.0000 mg | ORAL | Status: DC | PRN

## 2023-07-01 MED ORDER — DOCUSATE SODIUM 100 MG PO CAPS
100.0000 mg | ORAL_CAPSULE | Freq: Two times a day (BID) | ORAL | Status: DC
  Administered 2023-07-01 – 2023-07-02 (×2): 100 mg via ORAL
  Filled 2023-07-01 (×2): qty 1

## 2023-07-01 MED ORDER — ENOXAPARIN SODIUM 40 MG/0.4ML IJ SOSY
40.0000 mg | PREFILLED_SYRINGE | INTRAMUSCULAR | Status: DC
  Administered 2023-07-01 – 2023-07-19 (×19): 40 mg via SUBCUTANEOUS
  Filled 2023-07-01 (×19): qty 0.4

## 2023-07-01 MED ORDER — LISINOPRIL 20 MG PO TABS
20.0000 mg | ORAL_TABLET | Freq: Every day | ORAL | Status: DC
  Administered 2023-07-02 – 2023-07-04 (×3): 20 mg via ORAL
  Filled 2023-07-01 (×3): qty 1

## 2023-07-01 MED ORDER — ASPIRIN 81 MG PO TBEC: 81.0000 mg | DELAYED_RELEASE_TABLET | Freq: Every day | ORAL | Status: DC

## 2023-07-01 MED ORDER — AMLODIPINE BESYLATE 5 MG PO TABS: 5.0000 mg | ORAL_TABLET | Freq: Every day | ORAL | Status: DC

## 2023-07-01 MED ORDER — TICAGRELOR 90 MG PO TABS
90.0000 mg | ORAL_TABLET | Freq: Two times a day (BID) | ORAL | Status: AC
  Administered 2023-07-01 – 2023-07-15 (×29): 90 mg via ORAL
  Filled 2023-07-01 (×29): qty 1

## 2023-07-01 MED ORDER — ASPIRIN 81 MG PO TBEC
81.0000 mg | DELAYED_RELEASE_TABLET | Freq: Every day | ORAL | Status: DC
  Administered 2023-07-02 – 2023-07-20 (×19): 81 mg via ORAL
  Filled 2023-07-01 (×19): qty 1

## 2023-07-01 MED ORDER — DOCUSATE SODIUM 100 MG PO CAPS: 100.0000 mg | ORAL_CAPSULE | Freq: Two times a day (BID) | ORAL | 0 refills | Status: DC

## 2023-07-01 MED ORDER — ACETAMINOPHEN 650 MG RE SUPP: 650.0000 mg | RECTAL | Status: DC | PRN

## 2023-07-01 MED ORDER — MEMANTINE HCL 10 MG PO TABS
10.0000 mg | ORAL_TABLET | Freq: Two times a day (BID) | ORAL | Status: DC
  Administered 2023-07-01 – 2023-07-20 (×38): 10 mg via ORAL
  Filled 2023-07-01 (×38): qty 1

## 2023-07-01 MED ORDER — TICAGRELOR 90 MG PO TABS: 90.0000 mg | ORAL_TABLET | Freq: Two times a day (BID) | ORAL | Status: DC

## 2023-07-01 MED ORDER — AMLODIPINE BESYLATE 5 MG PO TABS
5.0000 mg | ORAL_TABLET | Freq: Every day | ORAL | Status: DC
  Administered 2023-07-02 – 2023-07-06 (×5): 5 mg via ORAL
  Filled 2023-07-01 (×5): qty 1

## 2023-07-01 MED ORDER — CALCIUM CARBONATE ANTACID 500 MG PO CHEW: 1.0000 | CHEWABLE_TABLET | Freq: Three times a day (TID) | ORAL | Status: DC

## 2023-07-01 MED ORDER — ACETAMINOPHEN 325 MG PO TABS
650.0000 mg | ORAL_TABLET | ORAL | Status: DC | PRN
  Administered 2023-07-08 – 2023-07-19 (×7): 650 mg via ORAL
  Filled 2023-07-01 (×7): qty 2

## 2023-07-01 MED ORDER — CALCIUM CARBONATE ANTACID 500 MG PO CHEW
1.0000 | CHEWABLE_TABLET | Freq: Three times a day (TID) | ORAL | Status: DC
  Administered 2023-07-01 – 2023-07-20 (×54): 200 mg via ORAL
  Filled 2023-07-01 (×51): qty 1

## 2023-07-01 MED ORDER — POLYETHYLENE GLYCOL 3350 17 G PO PACK: 17.0000 g | PACK | Freq: Every day | ORAL | 0 refills | Status: DC

## 2023-07-01 MED ORDER — ENOXAPARIN SODIUM 40 MG/0.4ML IJ SOSY: 40.0000 mg | PREFILLED_SYRINGE | INTRAMUSCULAR | Status: DC

## 2023-07-01 NOTE — PMR Pre-admission (Signed)
PMR Admission Coordinator Pre-Admission Assessment  Patient: Michael Kidd is an 89 y.o., male MRN: [id number] DOB: [birth year] Height: 5\' 11"  (180.3 cm) Weight: 95 kg              Insurance Information HMO:     PPO: yes     PCP:      IPA:      80/20:      OTHER:  PRIMARY: Orpah Clinton       Policy#: [id number]      Subscriber: pt CM Name: via portal      Phone#:      Fax#: [phone] Pre-Cert#: [id number] auth for CIR via portal for admit 7/25 with updates due to fax listed above on 7/31      Employer:  Benefits:  Phone #: [phone]     Name:  Eff. Date: 12/07/22     Deduct: $0      Out of Pocket Max: $4500  [phone] met)    Life Max: n/a  CIR: $295/day for days 1-6      SNF: 20 full days Outpatient:      Co-Pay: $20/visit Home Health: 100%      Co-Pay:  DME: 80%     Co-Pay: 20% Providers:  SECONDARY:       Policy#:       Phone#:   Artist:       Phone#:   The Engineer, materials Information Summary" for patients in Inpatient Rehabilitation Facilities with attached "Privacy Act Statement-Health Care Records" was provided and verbally reviewed with: Patient and Family  Emergency Contact Information Contact Information     Name Relation Home Work Soso Spouse [phone]        Other Contacts     Name Relation Home Work Mobile   Primm,Danny Relative [phone]  [phone]   Myrtice Lauth" Daughter   [phone]   PRIMM,LORI Relative   [phone]      Current Medical History  Patient Admitting Diagnosis: CVA   History of Present Illness: Michael Kidd is a 89 year old right-handed male with history of hypertension, BPH, aortic stenosis status post TAVR 2021, CAD with stenting, hyperlipidemia, tobacco use, history of CVA January 2024 maintained on Plavix with workup completed at [facility]. Presented on 06/23/23 with c/o hemiparesis.  CT/MRI showed small area of acute ischemia of the left corpus stratum. No hemorrhage  or mass effect. Old right parietal and occipital infarcts as well as old small vessel infarction of the basal ganglia, cerebellum and corona radiata. CTA showed no large vessel occlusion or hemodynamically significant stenosis. Admission chemistries unremarkable except potassium 3.4, glucose 136, hemoglobin A1c 5.7. Echocardiogram with ejection fraction of 60 to 65% no wall motion abnormalities grade 1 diastolic dysfunction. Neurology follow-up currently maintained on low-dose aspirin with Brilinta 90 mg twice daily. Lovenox added for DVT prophylaxis. Tolerating a regular consistency diet. Therapy evaluations completed and pt was recommended for a comprehensive rehab program.   Complete NIHSS TOTAL: 5 Glasgow Coma Scale Score: 14  Patient's medical record from [facility] has been reviewed by the rehabilitation admission coordinator and physician.  Past Medical History  Past Medical History:  Diagnosis Date   Allergic rhinitis 03/29/2014   Aortic valve stenosis 05/23/2020   Arthralgia 07/15/2016   Arthritis    Benign prostatic hyperplasia 02/14/2014   Benign prostatic hyperplasia with urinary frequency 11/02/2018   Bilateral chronic knee pain    BPH (benign prostatic hyperplasia)    CAD (coronary artery disease)  s/p remote LAD PCI   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 PHQ-9 completed 02/26/14 PHQ-2 completed 02/14/14   Dizziness 03/12/2014   Dupuytren's contracture 07/15/2016   Dyspnea on exertion 08/15/2020   Dysthymia    Essential hypertension 03/12/2014   Flexor tendon bowstring 01/06/2016   Gastroesophageal reflux disease 08/18/2016   Hepatic steatosis 03/02/2014   History of CVA (cerebrovascular accident) 10/12/2019   HLD (hyperlipidemia) 02/14/2014   HTN (hypertension)    Late effect of  cerebrovascular accident (CVA) 10/19/2019   Melanoma in situ of right upper extremity including shoulder (HCC) 08/07/2016   Formatting of this note might be different from the original. August 2017.  Dr. Chancy Milroy, [facility].   Murmur 05/23/2020   Myocardial infarction [facility])    Neuropathic pain, leg, bilateral 11/23/2017   Nonsustained ventricular tachycardia (HCC) 11/12/2020   Pure hypercholesterolemia 09/14/2018   Quadriceps weakness 09/01/2017   Renal cyst    Risk for falls 10/01/2016   S/P TAVR (transcatheter aortic valve replacement) 09/24/2020   Short of breath on exertion    per patient   Stroke [facility])    November   Symptomatic severe aortic stenosis with normal ejection fraction 08/15/2020    Has the patient had major surgery during 100 days prior to admission? No  Family History  family history includes Diabetes in his mother; Stroke in his father.   Current Medications   Current Facility-Administered Medications:    acetaminophen (TYLENOL) tablet 650 mg, 650 mg, Oral, Q4H PRN, 650 mg at 06/24/23 2106 **OR** acetaminophen (TYLENOL) 160 MG/5ML solution 650 mg, 650 mg, Per Tube, Q4H PRN **OR** acetaminophen (TYLENOL) suppository 650 mg, 650 mg, Rectal, Q4H PRN, Synetta Fail, MD   amLODipine (NORVASC) tablet 5 mg, 5 mg, Oral, Daily, Pokhrel, Laxman, MD, 5 mg at 07/01/23 6578   aspirin EC tablet 81 mg, 81 mg, Oral, Daily, Richardo Priest, Erin C, NP, 81 mg at 07/01/23 0839   atorvastatin (LIPITOR) tablet 80 mg, 80 mg, Oral, QPM, Lehner, Erin C, NP, 80 mg at 06/30/23 1837   calcium carbonate (TUMS - dosed in mg elemental calcium) chewable tablet 200 mg of elemental calcium, 1 tablet, Oral, TID WC, Ghimire, Kuber, MD, 200 mg of elemental calcium at 07/01/23 0839   docusate sodium (COLACE) capsule 100 mg, 100 mg, Oral, BID, Pokhrel, Laxman, MD, 100 mg at 07/01/23 0839   enoxaparin (LOVENOX) injection 40 mg, 40 mg, Subcutaneous, Q24H, Reome, Earle J, RPH, 40 mg at 06/30/23  1447   lisinopril (ZESTRIL) tablet 20 mg, 20 mg, Oral, Daily, Pokhrel, Laxman, MD, 20 mg at 07/01/23 0839   memantine (NAMENDA) tablet 10 mg, 10 mg, Oral, BID, Synetta Fail, MD, 10 mg at 07/01/23 0839   polyethylene glycol (MIRALAX / GLYCOLAX) packet 17 g, 17 g, Oral, Daily, Pokhrel, Laxman, MD, 17 g at 07/01/23 0840   ticagrelor (BRILINTA) tablet 90 mg, 90 mg, Oral, BID, Hetty Blend C, NP, 90 mg at 07/01/23 4696  Patients Current Diet:  Diet Order             Diet Heart Room service appropriate? Yes; Fluid consistency: Thin  Diet effective now                   Precautions / Restrictions Precautions Precautions: Fall Precaution  Comments: aphasia Restrictions Weight Bearing Restrictions: No   Has the patient had 2 or more falls or a fall with injury in the past year?No  Prior Activity Level Household: mod I with RW at home, sponge bathes at baseline, does not drive  Prior Functional Level Prior Function Prior Level of Function : Patient poor historian/Family not available, Needs assist Mobility Comments: uses RW for mobility ADLs Comments: sponge bathing for a few years per grandson, able to dress self. wife does IADLs, neither drive so friends assist with groceries  Self Care: Did the patient need help bathing, dressing, using the toilet or eating?  Needed some help  Indoor Mobility: Did the patient need assistance with walking from room to room (with or without device)? Independent  Stairs: Did the patient need assistance with internal or external stairs (with or without device)? Independent  Functional Cognition: Did the patient need help planning regular tasks such as shopping or remembering to take medications? Needed some help  Patient Information Are you of Hispanic, Latino/a,or Spanish origin?: A. No, not of Hispanic, Latino/a, or Spanish origin What is your race?: A. White Do you need or want an interpreter to communicate with a doctor or health care  staff?: 0. No  Patient's Response To:  Health Literacy and Transportation Is the patient able to respond to health literacy and transportation needs?: Yes Health Literacy - How often do you need to have someone help you when you read instructions, pamphlets, or other written material from your doctor or pharmacy?: Never In the past 12 months, has lack of transportation kept you from medical appointments or from getting medications?: No In the past 12 months, has lack of transportation kept you from meetings, work, or from getting things needed for daily living?: No  Journalist, newspaper / Equipment Home Assistive Devices/Equipment: None Home Equipment: Agricultural consultant (2 wheels), Other (comment) (lift chair)  Prior Device Use: Indicate devices/aids used by the patient prior to current illness, exacerbation or injury? Walker  Current Functional Level Cognition  Overall Cognitive Status: Impaired/Different from baseline Current Attention Level: Sustained Orientation Level: Oriented to person, Disoriented to place, Disoriented to time, Disoriented to situation Following Commands: Follows one step commands consistently, Follows one step commands with increased time Safety/Judgement: Decreased awareness of safety, Decreased awareness of deficits General Comments: (from prior note: per grandson, pt with memory impairments (sometimes will forget grandson's name if he has not been by in a couple of days). pt reports "I've never had a stroke before" in relation to disbelief being in hospital. able to show some emergent awareness when asked what other tasks he may need to complete at sink the morning but still needs cues for sequencing and problem solving - pt attempting to shave with dry razor - cued to use water and shaving cream to prevent injury Memory: Impaired Memory Impairment: Prospective memory (did not recall that he asked SLP to bring him coffee when eval was done) Problem Solving:  Impaired Problem Solving Impairment: Functional basic Safety/Judgment: Impaired (pt states he wants to go home despite knowing he had a stroke and has weakness with speech and language deficits)    Extremity Assessment (includes Sensation/Coordination)  Upper Extremity Assessment: Generalized weakness  Lower Extremity Assessment: Defer to PT evaluation RLE Deficits / Details: Reports hx of Rt knee arthritis and pain. RLE Sensation: decreased light touch (Rt distal LE)    ADLs  Overall ADL's : Needs assistance/impaired Eating/Feeding: Modified independent, Sitting Grooming: Minimal assistance, Standing, Oral care  Grooming Details (indicate cue type and reason): shaving at sink with cues needed to use water and shaving cream. up to Min A for balance when unsupported or moving dynamically with minor posterior LOB. When asked if anything else needed to be done at this in AM, pt reports denture care with pt able to perform with increased time and prompting to problem solve the next steps Upper Body Bathing: Minimal assistance, Sitting Lower Body Bathing: Moderate assistance, Sit to/from stand Upper Body Dressing : Minimal assistance, Sitting Lower Body Dressing: Moderate assistance, Sit to/from stand Toilet Transfer: Minimal assistance, Ambulation, Rolling walker (2 wheels) Toileting- Clothing Manipulation and Hygiene: Modified independent, Bed level (using urinal only) Functional mobility during ADLs: Minimal assistance, Rolling walker (2 wheels), Cueing for sequencing, Cueing for safety General ADL Comments: Continues to need cueing for problem solving, general awareness and safety with hands on assist needed to use typically familiar DME. Pt reports "i dont know" when asked what he wanted for breakfast and when asked what he liked in his coffee. On delivery of breakfast tray, pt reports "wheres the coffee", able to locate and request creamer automatically    Mobility  Overal bed mobility:  Needs Assistance Bed Mobility: Supine to Sit Supine to sit: Min assist, Mod assist (more assist from flatter HOB to boost up via R elbow) General bed mobility comments: in chair on entry    Transfers  Overall transfer level: Needs assistance Equipment used: Rolling walker (2 wheels) Transfers: Sit to/from Stand Sit to Stand: Mod assist, Min assist Bed to/from chair/wheelchair/BSC transfer type:: Step pivot Step pivot transfers: Min assist General transfer comment: cues for hand placement and mod assist to come forward fromt he bed after nap    Ambulation / Gait / Stairs / Wheelchair Mobility  Ambulation/Gait Ambulation/Gait assistance: Min assist Gait Distance (Feet): 130 Feet (total, 4 stops in standing to regroup posture and proximity to the RW.  Noted more fatigue with each bout and degradation of gait pattern, R LE increasingly lagging behind and with worsening heel/toe ability) Assistive device: Rolling walker (2 wheels) Gait Pattern/deviations: Step-through pattern, Decreased stride length, Decreased weight shift to right, Shuffle, Drifts right/left, Trunk flexed General Gait Details: Improved distance, but needed more standing rest to make adjustments in attempt to improve pattern on the R LE and correct posture and use of the RW. Gait velocity: Decreased Gait velocity interpretation: <1.31 ft/sec, indicative of household ambulator    Posture / Balance Balance Overall balance assessment: Needs assistance Sitting-balance support: No upper extremity supported, Feet supported Sitting balance-Leahy Scale: Fair Standing balance support: During functional activity, Reliant on assistive device for balance, Single extremity supported Standing balance-Leahy Scale: Poor Standing balance comment: external support if standing unsupported at sink, BUE for mobility    Special needs/care consideration N/a     Previous Home Environment (from acute therapy documentation) Living  Arrangements: Spouse/significant other Available Help at Discharge: Family, Friend(s) Type of Home: House Home Layout: One level Bathroom Shower/Tub: Sponge bathes at baseline Firefighter: Standard Home Care Services: No Additional Comments: Wife unable to provide physical assistance  Discharge Living Setting Plans for Discharge Living Setting: Patient's home, Lives with (comment) (spouse) Type of Home at Discharge: House Discharge Home Layout: One level Discharge Home Access: Ramped entrance Discharge Bathroom Shower/Tub:  (sponge bathes) Discharge Bathroom Toilet: Standard Discharge Bathroom Accessibility: Yes How Accessible: Accessible via walker Does the patient have any problems obtaining your medications?: No  Social/Family/Support Systems Patient Roles: Spouse Anticipated Caregiver: spouse, Talbert Forest  Wallace Cullens Anticipated Caregiver's Contact Information: [phone] Ability/Limitations of Caregiver: can provide ONLY supervision, no physical assist Caregiver Availability: 24/7 Discharge Plan Discussed with Primary Caregiver: Yes Is Caregiver In Agreement with Plan?: Yes Does Caregiver/Family have Issues with Lodging/Transportation while Pt is in Rehab?: No   Goals Patient/Family Goal for Rehab: PT/OT/SLP supervision Expected length of stay: 10-14 days Additional Information: Discharge plan: home with spouse 24/7, Talbert Forest can provide ONLY supervision, no physical assist Pt/Family Agrees to Admission and willing to participate: Yes Program Orientation Provided & Reviewed with Pt/Caregiver Including Roles  & Responsibilities: Yes  Barriers to Discharge: Insurance for SNF coverage, Decreased caregiver support   Decrease burden of Care through IP rehab admission: n/a   Possible need for SNF placement upon discharge: Not anticipated.  Plan to d/c home with pt's spouse 24/7.  She can provide only supervision, no physical assist. May need STSNF if he does not progress to  supervision level as expected by rehab MD>    Patient Condition: This patient's medical and functional status has changed since the consult dated: 06/25/2023 in which the Rehabilitation Physician determined and documented that the patient's condition is appropriate for intensive rehabilitative care in an inpatient rehabilitation facility. See "History of Present Illness" (above) for medical update. Functional changes are: min assist. Patient's medical and functional status update has been discussed with the Rehabilitation physician and patient remains appropriate for inpatient rehabilitation. Will admit to inpatient rehab today.  Preadmission Screen Completed By:  Stephania Fragmin, PT, DPT7/25/2024 10:04 AM ______________________________________________________________________   Discussed status with Dr. Berline Chough on 07/01/23 at 10:10 AM  and received approval for admission today.  Admission Coordinator:  Stephania Fragmin, PT, DPT time 10:10 AM Dorna Bloom 07/01/23

## 2023-07-01 NOTE — Progress Notes (Signed)
PMR Admission Coordinator Pre-Admission Assessment   Patient: Michael Kidd is an 89 y.o., male MRN: [id number] DOB: 30-Jun-1930 Height: 5\' 11"  (180.3 cm) Weight: 95 kg                                                                                                                                                  Insurance Information HMO:     PPO: yes     PCP:      IPA:      80/20:      OTHER:  PRIMARY: Orpah Clinton       Policy#: [id number]      Subscriber: pt CM Name: via portal      Phone#:      Fax#: [phone] Pre-Cert#: [id number] auth for CIR via portal for admit 7/25 with updates due to fax listed above on 7/31      Employer:  Benefits:  Phone #: [phone]     Name:  Eff. Date: 12/07/22     Deduct: $0      Out of Pocket Max: $4500  [phone] met)    Life Max: n/a  CIR: $295/day for days 1-6      SNF: 20 full days Outpatient:      Co-Pay: $20/visit Home Health: 100%      Co-Pay:  DME: 80%     Co-Pay: 20% Providers:  SECONDARY:       Policy#:       Phone#:    Artist:       Phone#:    The Engineer, materials Information Summary" for patients in Inpatient Rehabilitation Facilities with attached "Privacy Act Statement-Health Care Records" was provided and verbally reviewed with: Patient and Family   Emergency Contact Information Contact Information       Name Relation Home Work [place] Spouse [phone]             Other Contacts       Name Relation Home Work Mobile    Primm,Danny Relative [phone]   [phone]    Myrtice Lauth" Daughter     [phone]    PRIMM,LORI Relative     [phone]         Current Medical History  Patient Admitting Diagnosis: CVA    History of Present Illness: Michael Kidd is a 89 year old right-handed male with history of hypertension, BPH, aortic stenosis status post TAVR 2021, CAD with stenting, hyperlipidemia, tobacco use, history of CVA January 2024 maintained on Plavix with workup  completed at [facility]. Presented on 06/23/23 with c/o hemiparesis.  CT/MRI showed small area of acute ischemia of the left corpus stratum. No hemorrhage or mass effect. Old right parietal and occipital infarcts as well as old small vessel infarction of the basal ganglia, cerebellum and corona radiata. CTA showed no large vessel occlusion or  hemodynamically significant stenosis. Admission chemistries unremarkable except potassium 3.4, glucose 136, hemoglobin A1c 5.7. Echocardiogram with ejection fraction of 60 to 65% no wall motion abnormalities grade 1 diastolic dysfunction. Neurology follow-up currently maintained on low-dose aspirin with Brilinta 90 mg twice daily. Lovenox added for DVT prophylaxis. Tolerating a regular consistency diet. Therapy evaluations completed and pt was recommended for a comprehensive rehab program.    Complete NIHSS TOTAL: 5 Glasgow Coma Scale Score: 14   Patient's medical record from [facility] has been reviewed by the rehabilitation admission coordinator and physician.   Past Medical History      Past Medical History:  Diagnosis Date   Allergic rhinitis 03/29/2014   Aortic valve stenosis 05/23/2020   Arthralgia 07/15/2016   Arthritis     Benign prostatic hyperplasia 02/14/2014   Benign prostatic hyperplasia with urinary frequency 11/02/2018   Bilateral chronic knee pain     BPH (benign prostatic hyperplasia)     CAD (coronary artery disease)      s/p remote LAD PCI   Chronic ischemic heart disease 08/13/2010   Chronic pain of both knees 09/01/2017   Coronary artery disease with PCI and stenting many years ago 10/19/2019   Depression 02/14/2014    Formatting of this note might be different from the original. PHQ-9 completed 02/26/14 PHQ-2 completed 02/14/14   Dizziness 03/12/2014   Dupuytren's contracture 07/15/2016   Dyspnea on exertion 08/15/2020   Dysthymia     Essential hypertension 03/12/2014   Flexor tendon bowstring 01/06/2016   Gastroesophageal reflux disease  08/18/2016   Hepatic steatosis 03/02/2014   History of CVA (cerebrovascular accident) 10/12/2019   HLD (hyperlipidemia) 02/14/2014   HTN (hypertension)     Late effect of cerebrovascular accident (CVA) 10/19/2019   Melanoma in situ of right upper extremity including shoulder (HCC) 08/07/2016    Formatting of this note might be different from the original. August 2017.  Dr. Chancy Milroy, [facility].   Murmur 05/23/2020   Myocardial infarction [facility])     Neuropathic pain, leg, bilateral 11/23/2017   Nonsustained ventricular tachycardia (HCC) 11/12/2020   Pure hypercholesterolemia 09/14/2018   Quadriceps weakness 09/01/2017   Renal cyst     Risk for falls 10/01/2016   S/P TAVR (transcatheter aortic valve replacement) 09/24/2020   Short of breath on exertion      per patient   Stroke [facility])      November   Symptomatic severe aortic stenosis with normal ejection fraction 08/15/2020          Has the patient had major surgery during 100 days prior to admission? No   Family History  family history includes Diabetes in his mother; Stroke in his father.     Current Medications   Current Medications    Current Facility-Administered Medications:    acetaminophen (TYLENOL) tablet 650 mg, 650 mg, Oral, Q4H PRN, 650 mg at 06/24/23 2106 **OR** acetaminophen (TYLENOL) 160 MG/5ML solution 650 mg, 650 mg, Per Tube, Q4H PRN **OR** acetaminophen (TYLENOL) suppository 650 mg, 650 mg, Rectal, Q4H PRN, Synetta Fail, MD   amLODipine (NORVASC) tablet 5 mg, 5 mg, Oral, Daily, Pokhrel, Laxman, MD, 5 mg at 07/01/23 1610   aspirin EC tablet 81 mg, 81 mg, Oral, Daily, Richardo Priest, Erin C, NP, 81 mg at 07/01/23 0839   atorvastatin (LIPITOR) tablet 80 mg, 80 mg, Oral, QPM, Lehner, Erin C, NP, 80 mg at 06/30/23 1837   calcium carbonate (TUMS - dosed in mg elemental calcium) chewable tablet 200 mg of  elemental calcium, 1 tablet, Oral, TID WC, Ghimire, Kuber, MD, 200 mg of elemental calcium at 07/01/23 0839    docusate sodium (COLACE) capsule 100 mg, 100 mg, Oral, BID, Pokhrel, Laxman, MD, 100 mg at 07/01/23 0839   enoxaparin (LOVENOX) injection 40 mg, 40 mg, Subcutaneous, Q24H, Reome, Earle J, RPH, 40 mg at 06/30/23 1447   lisinopril (ZESTRIL) tablet 20 mg, 20 mg, Oral, Daily, Pokhrel, Laxman, MD, 20 mg at 07/01/23 0839   memantine (NAMENDA) tablet 10 mg, 10 mg, Oral, BID, Synetta Fail, MD, 10 mg at 07/01/23 0839   polyethylene glycol (MIRALAX / GLYCOLAX) packet 17 g, 17 g, Oral, Daily, Pokhrel, Laxman, MD, 17 g at 07/01/23 0840   ticagrelor (BRILINTA) tablet 90 mg, 90 mg, Oral, BID, Hetty Blend C, NP, 90 mg at 07/01/23 1610     Patients Current Diet:  Diet Order                  Diet Heart Room service appropriate? Yes; Fluid consistency: Thin  Diet effective now                         Precautions / Restrictions Precautions Precautions: Fall Precaution Comments: aphasia Restrictions Weight Bearing Restrictions: No    Has the patient had 2 or more falls or a fall with injury in the past year?No   Prior Activity Level Household: mod I with RW at home, sponge bathes at baseline, does not drive   Prior Functional Level Prior Function Prior Level of Function : Patient poor historian/Family not available, Needs assist Mobility Comments: uses RW for mobility ADLs Comments: sponge bathing for a few years per grandson, able to dress self. wife does IADLs, neither drive so friends assist with groceries   Self Care: Did the patient need help bathing, dressing, using the toilet or eating?  Needed some help   Indoor Mobility: Did the patient need assistance with walking from room to room (with or without device)? Independent   Stairs: Did the patient need assistance with internal or external stairs (with or without device)? Independent   Functional Cognition: Did the patient need help planning regular tasks such as shopping or remembering to take medications? Needed some help    Patient Information Are you of Hispanic, Latino/a,or Spanish origin?: A. No, not of Hispanic, Latino/a, or Spanish origin What is your race?: A. White Do you need or want an interpreter to communicate with a doctor or health care staff?: 0. No   Patient's Response To:  Health Literacy and Transportation Is the patient able to respond to health literacy and transportation needs?: Yes Health Literacy - How often do you need to have someone help you when you read instructions, pamphlets, or other written material from your doctor or pharmacy?: Never In the past 12 months, has lack of transportation kept you from medical appointments or from getting medications?: No In the past 12 months, has lack of transportation kept you from meetings, work, or from getting things needed for daily living?: No   Journalist, newspaper / Equipment Home Assistive Devices/Equipment: None Home Equipment: Agricultural consultant (2 wheels), Other (comment) (lift chair)   Prior Device Use: Indicate devices/aids used by the patient prior to current illness, exacerbation or injury? Walker   Current Functional Level Cognition   Overall Cognitive Status: Impaired/Different from baseline Current Attention Level: Sustained Orientation Level: Oriented to person, Disoriented to place, Disoriented to time, Disoriented to situation Following Commands: Follows  one step commands consistently, Follows one step commands with increased time Safety/Judgement: Decreased awareness of safety, Decreased awareness of deficits General Comments: (from prior note: per grandson, pt with memory impairments (sometimes will forget grandson's name if he has not been by in a couple of days). pt reports "I've never had a stroke before" in relation to disbelief being in hospital. able to show some emergent awareness when asked what other tasks he may need to complete at sink the morning but still needs cues for sequencing and problem solving - pt  attempting to shave with dry razor - cued to use water and shaving cream to prevent injury Memory: Impaired Memory Impairment: Prospective memory (did not recall that he asked SLP to bring him coffee when eval was done) Problem Solving: Impaired Problem Solving Impairment: Functional basic Safety/Judgment: Impaired (pt states he wants to go home despite knowing he had a stroke and has weakness with speech and language deficits)    Extremity Assessment (includes Sensation/Coordination)   Upper Extremity Assessment: Generalized weakness  Lower Extremity Assessment: Defer to PT evaluation RLE Deficits / Details: Reports hx of Rt knee arthritis and pain. RLE Sensation: decreased light touch (Rt distal LE)     ADLs   Overall ADL's : Needs assistance/impaired Eating/Feeding: Modified independent, Sitting Grooming: Minimal assistance, Standing, Oral care Grooming Details (indicate cue type and reason): shaving at sink with cues needed to use water and shaving cream. up to Min A for balance when unsupported or moving dynamically with minor posterior LOB. When asked if anything else needed to be done at this in AM, pt reports denture care with pt able to perform with increased time and prompting to problem solve the next steps Upper Body Bathing: Minimal assistance, Sitting Lower Body Bathing: Moderate assistance, Sit to/from stand Upper Body Dressing : Minimal assistance, Sitting Lower Body Dressing: Moderate assistance, Sit to/from stand Toilet Transfer: Minimal assistance, Ambulation, Rolling walker (2 wheels) Toileting- Clothing Manipulation and Hygiene: Modified independent, Bed level (using urinal only) Functional mobility during ADLs: Minimal assistance, Rolling walker (2 wheels), Cueing for sequencing, Cueing for safety General ADL Comments: Continues to need cueing for problem solving, general awareness and safety with hands on assist needed to use typically familiar DME. Pt reports "i dont  know" when asked what he wanted for breakfast and when asked what he liked in his coffee. On delivery of breakfast tray, pt reports "wheres the coffee", able to locate and request creamer automatically     Mobility   Overal bed mobility: Needs Assistance Bed Mobility: Supine to Sit Supine to sit: Min assist, Mod assist (more assist from flatter HOB to boost up via R elbow) General bed mobility comments: in chair on entry     Transfers   Overall transfer level: Needs assistance Equipment used: Rolling walker (2 wheels) Transfers: Sit to/from Stand Sit to Stand: Mod assist, Min assist Bed to/from chair/wheelchair/BSC transfer type:: Step pivot Step pivot transfers: Min assist General transfer comment: cues for hand placement and mod assist to come forward fromt he bed after nap     Ambulation / Gait / Stairs / Wheelchair Mobility   Ambulation/Gait Ambulation/Gait assistance: Min assist Gait Distance (Feet): 130 Feet (total, 4 stops in standing to regroup posture and proximity to the RW.  Noted more fatigue with each bout and degradation of gait pattern, R LE increasingly lagging behind and with worsening heel/toe ability) Assistive device: Rolling walker (2 wheels) Gait Pattern/deviations: Step-through pattern, Decreased stride length, Decreased weight shift  to right, Shuffle, Drifts right/left, Trunk flexed General Gait Details: Improved distance, but needed more standing rest to make adjustments in attempt to improve pattern on the R LE and correct posture and use of the RW. Gait velocity: Decreased Gait velocity interpretation: <1.31 ft/sec, indicative of household ambulator     Posture / Balance Balance Overall balance assessment: Needs assistance Sitting-balance support: No upper extremity supported, Feet supported Sitting balance-Leahy Scale: Fair Standing balance support: During functional activity, Reliant on assistive device for balance, Single extremity supported Standing  balance-Leahy Scale: Poor Standing balance comment: external support if standing unsupported at sink, BUE for mobility     Special needs/care consideration N/a        Previous Home Environment (from acute therapy documentation) Living Arrangements: Spouse/significant other Available Help at Discharge: Family, Friend(s) Type of Home: House Home Layout: One level Bathroom Shower/Tub: Sponge bathes at baseline Firefighter: Standard Home Care Services: No Additional Comments: Wife unable to provide physical assistance   Discharge Living Setting Plans for Discharge Living Setting: Patient's home, Lives with (comment) (spouse) Type of Home at Discharge: House Discharge Home Layout: One level Discharge Home Access: Ramped entrance Discharge Bathroom Shower/Tub:  (sponge bathes) Discharge Bathroom Toilet: Standard Discharge Bathroom Accessibility: Yes How Accessible: Accessible via walker Does the patient have any problems obtaining your medications?: No   Social/Family/Support Systems Patient Roles: Spouse Anticipated Caregiver: spouse, Erek Kowal Anticipated Caregiver's Contact Information: [phone] Ability/Limitations of Caregiver: can provide ONLY supervision, no physical assist Caregiver Availability: 24/7 Discharge Plan Discussed with Primary Caregiver: Yes Is Caregiver In Agreement with Plan?: Yes Does Caregiver/Family have Issues with Lodging/Transportation while Pt is in Rehab?: No     Goals Patient/Family Goal for Rehab: PT/OT/SLP supervision Expected length of stay: 10-14 days Additional Information: Discharge plan: home with spouse 24/7, Talbert Forest can provide ONLY supervision, no physical assist Pt/Family Agrees to Admission and willing to participate: Yes Program Orientation Provided & Reviewed with Pt/Caregiver Including Roles  & Responsibilities: Yes  Barriers to Discharge: Insurance for SNF coverage, Decreased caregiver support     Decrease burden of  Care through IP rehab admission: n/a     Possible need for SNF placement upon discharge: Not anticipated.  Plan to d/c home with pt's spouse 24/7.  She can provide only supervision, no physical assist. May need STSNF if he does not progress to supervision level as expected by rehab MD>      Patient Condition: This patient's medical and functional status has changed since the consult dated: 06/25/2023 in which the Rehabilitation Physician determined and documented that the patient's condition is appropriate for intensive rehabilitative care in an inpatient rehabilitation facility. See "History of Present Illness" (above) for medical update. Functional changes are: min assist. Patient's medical and functional status update has been discussed with the Rehabilitation physician and patient remains appropriate for inpatient rehabilitation. Will admit to inpatient rehab today.   Preadmission Screen Completed By:  Stephania Fragmin, PT, DPT7/25/2024 10:04 AM ______________________________________________________________________   Discussed status with Dr. Berline Chough on 07/01/23 at 10:10 AM  and received approval for admission today.   Admission Coordinator:  Stephania Fragmin, PT, DPT time 10:10 AM Dorna Bloom 07/01/23

## 2023-07-01 NOTE — Progress Notes (Signed)
Inpatient Rehabilitation Admission Medication Review by a Pharmacist  A complete drug regimen review was completed for this patient to identify any potential clinically significant medication issues.  High Risk Drug Classes Is patient taking? Indication by Medication  Antipsychotic No   Anticoagulant Yes Lovenox- vte ppx  Antibiotic No   Opioid No   Antiplatelet Yes Aspirin, brilinta- CAD s/p stent  Hypoglycemics/insulin No   Vasoactive Medication Yes Lisinopril, norvasc- HTN  Chemotherapy No   Other Yes Namenda- dementia Lipitor- HLD     Type of Medication Issue Identified Description of Issue Recommendation(s)  Drug Interaction(s) (clinically significant)     Duplicate Therapy     Allergy     No Medication Administration End Date     Incorrect Dose     Additional Drug Therapy Needed     Significant med changes from prior encounter (inform family/care partners about these prior to discharge). Plavix Communicate to patient that Plavix is no longer prescribed and has been substituted with brilinta  Other  PTA meds: Proscar B-12 Albuterol inh Restart PTA meds when and if necessary during CIR admission or at time of discharge, if warranted    Clinically significant medication issues were identified that warrant physician communication and completion of prescribed/recommended actions by midnight of the next day:  No   Time spent performing this drug regimen review (minutes):  30   Doris Mcgilvery BS, PharmD, BCPS Clinical Pharmacist 07/01/2023 12:27 PM  Contact: [phone] after 3 PM  "Be curious, not judgmental..." -Debbora Dus

## 2023-07-01 NOTE — Discharge Instructions (Signed)
Inpatient Rehab Discharge Instructions  Michael Kidd Discharge date and time: No discharge date for patient encounter.   Activities/Precautions/ Functional Status: Activity: activity as tolerated Diet: regular diet Wound Care: Routine skin checks Functional status:  ___ No restrictions     ___ Walk up steps independently ___ 24/7 supervision/assistance   ___ Walk up steps with assistance ___ Intermittent supervision/assistance  ___ Bathe/dress independently ___ Walk with walker     _x__ Bathe/dress with assistance ___ Walk Independently    ___ Shower independently ___ Walk with assistance    ___ Shower with assistance ___ No alcohol     ___ Return to work/school ________  Special Instructions:  No driving smoking or alcoholSTROKE/TIA DISCHARGE INSTRUCTIONS SMOKING Cigarette smoking nearly doubles your risk of having a stroke & is the single most alterable risk factor  If you smoke or have smoked in the last 12 months, you are advised to quit smoking for your health. Most of the excess cardiovascular risk related to smoking disappears within a year of stopping. Ask you doctor about anti-smoking medications [place] Quit Line: 1-800-QUIT NOW Free Smoking Cessation Classes [phone]  CHOLESTEROL Know your levels; limit fat & cholesterol in your diet  Lipid Panel     Component Value Date/Time   CHOL 100 06/24/2023 0126   TRIG 94 06/24/2023 0126   HDL 31 (L) 06/24/2023 0126   CHOLHDL 3.2 06/24/2023 0126   VLDL 19 06/24/2023 0126   LDLCALC 50 06/24/2023 0126     Many patients benefit from treatment even if their cholesterol is at goal. Goal: Total Cholesterol (CHOL) less than 160 Goal:  Triglycerides (TRIG) less than 150 Goal:  HDL greater than 40 Goal:  LDL (LDLCALC) less than 100   BLOOD PRESSURE American Stroke Association blood pressure target is less that 120/80 mm/Hg  Your discharge blood pressure is:    Monitor your blood pressure Limit your salt and alcohol intake Many  individuals will require more than one medication for high blood pressure  DIABETES (A1c is a blood sugar average for last 3 months) Goal HGBA1c is under 7% (HBGA1c is blood sugar average for last 3 months)  Diabetes: No known diagnosis of diabetes    Lab Results  Component Value Date   HGBA1C 5.7 (H) 06/24/2023    Your HGBA1c can be lowered with medications, healthy diet, and exercise. Check your blood sugar as directed by your physician Call your physician if you experience unexplained or low blood sugars.  PHYSICAL ACTIVITY/REHABILITATION Goal is 30 minutes at least 4 days per week  Activity: Increase activity slowly, Therapies: Physical Therapy: Home Health Return to work:  Activity decreases your risk of heart attack and stroke and makes your heart stronger.  It helps control your weight and blood pressure; helps you relax and can improve your mood. Participate in a regular exercise program. Talk with your doctor about the best form of exercise for you (dancing, walking, swimming, cycling).  DIET/WEIGHT Goal is to maintain a healthy weight  Your discharge diet is:  Diet Order     None       liquids Your height is:    Your current weight is:   Your Body Mass Index (BMI) is:    Following the type of diet specifically designed for you will help prevent another stroke. Your goal weight range is:   Your goal Body Mass Index (BMI) is 19-24. Healthy food habits can help reduce 3 risk factors for stroke:  High cholesterol, hypertension,  and excess weight.  RESOURCES Stroke/Support Group:  Call [phone]   STROKE EDUCATION PROVIDED/REVIEWED AND GIVEN TO PATIENT Stroke warning signs and symptoms How to activate emergency medical system (call 911). Medications prescribed at discharge. Need for follow-up after discharge. Personal risk factors for stroke. Pneumonia vaccine given: No Flu vaccine given: No My questions have been answered, the writing is legible, and I understand  these instructions.  I will adhere to these goals & educational materials that have been provided to me after my discharge from the hospital.     My questions have been answered and I understand these instructions. I will adhere to these goals and the provided educational materials after my discharge from the hospital.  Patient/Caregiver Signature _______________________________ Date __________  Clinician Signature _______________________________________ Date __________  Please bring this form and your medication list with you to all your follow-up doctor's appointments.

## 2023-07-01 NOTE — Progress Notes (Signed)
Physical Medicine and Rehabilitation Consult Reason for Consult:right sided weakness, impaired functional mobility Referring Physician: Pokhrel      HPI: Michael Kidd is a 89 y.o. male 89 yo male with hx of HTN, prior CVA, AS/RAVR, CAD who presented on 06/23/23 with right sided weakness and altered mental status. CT demonstrated low attenuation area suspicious for infarct. CTA without LVO. MRI ultimately demonstrated small area of acute infarct in the left corpus callosum/internal capsule. Neurology recommends asa and brilinta for 4 weeks then asa alone.  Pt was up with PT yesterday and was mod assist sit-std transfers and walked 32' min assist with RW  shuffling, slow gait pattern. Pt lives at home with spouse who cannot provide physical assistance. Pt used RW PTA and able to perform basic ADL's on his own.     Review of Systems  Unable to perform ROS: Mental acuity        Past Medical History:  Diagnosis Date   Allergic rhinitis 03/29/2014   Aortic valve stenosis 05/23/2020   Arthralgia 07/15/2016   Arthritis     Benign prostatic hyperplasia 02/14/2014   Benign prostatic hyperplasia with urinary frequency 11/02/2018   Bilateral chronic knee pain     BPH (benign prostatic hyperplasia)     CAD (coronary artery disease)      s/p remote LAD PCI   Chronic ischemic heart disease 08/13/2010   Chronic pain of both knees 09/01/2017   Coronary artery disease with PCI and stenting many years ago 10/19/2019   Depression 02/14/2014    Formatting of this note might be different from the original. PHQ-9 completed 02/26/14 PHQ-2 completed 02/14/14   Dizziness 03/12/2014   Dupuytren's contracture 07/15/2016   Dyspnea on exertion 08/15/2020   Dysthymia     Essential hypertension 03/12/2014   Flexor tendon bowstring 01/06/2016   Gastroesophageal reflux disease 08/18/2016   Hepatic steatosis 03/02/2014   History of CVA (cerebrovascular accident) 10/12/2019   HLD (hyperlipidemia) 02/14/2014   HTN  (hypertension)     Late effect of cerebrovascular accident (CVA) 10/19/2019   Melanoma in situ of right upper extremity including shoulder (HCC) 08/07/2016    Formatting of this note might be different from the original. August 2017.  Dr. Chancy Milroy, [facility].   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   Short of breath on exertion      per patient   Stroke [facility])      November   Symptomatic severe aortic stenosis with normal ejection fraction 08/15/2020             Past Surgical History:  Procedure Laterality Date   CATARACT EXTRACTION W/ INTRAOCULAR LENS  IMPLANT, BILATERAL       cataract repair       HERNIA REPAIR        unsure of date   LOOP RECORDER INSERTION N/A 08/28/2019    Procedure: LOOP RECORDER INSERTION;  Surgeon: Marinus Maw, MD;  Location: [facility] INVASIVE CV LAB;  Service: Cardiovascular;  Laterality: N/A;   PERCUTANEOUS CORONARY STENT INTERVENTION (PCI-S)       RIGHT/LEFT HEART CATH AND CORONARY ANGIOGRAPHY N/A 08/22/2020    Procedure: RIGHT/LEFT HEART  CATH AND CORONARY ANGIOGRAPHY;  Surgeon: Marykay Lex, MD;  Location: [facility] INVASIVE CV LAB;  Service: Cardiovascular;  Laterality: N/A;   TEE WITHOUT CARDIOVERSION N/A 09/24/2020    Procedure: TRANSESOPHAGEAL ECHOCARDIOGRAM (TEE);  Surgeon: Tonny Bollman, MD;  Location: [facility] OR;  Service: Open Heart Surgery;  Laterality: N/A;   TRANSCATHETER AORTIC VALVE REPLACEMENT, TRANSFEMORAL N/A 09/24/2020    Procedure: TRANSCATHETER AORTIC VALVE REPLACEMENT, TRANSFEMORAL - USING EDWARDS SAPIEN 3 ULTRA  VALVE SIZE ;  Surgeon: Tonny Bollman, MD;  Location: [facility] OR;  Service: Open Heart Surgery;  Laterality: N/A;             Family  History  Problem Relation Age of Onset   Diabetes Mother     Stroke Father          Social History:  reports that he has quit smoking. He has never used smokeless tobacco. He reports that he does not currently use alcohol. He reports that he does not use drugs. Allergies:  Allergies  No Known Allergies         Medications Prior to Admission  Medication Sig Dispense Refill   acetaminophen (TYLENOL) 650 MG CR tablet Take 325 mg by mouth every 8 (eight) hours as needed for pain.       albuterol (VENTOLIN HFA) 108 (90 Base) MCG/ACT inhaler Inhale 1 puff into the lungs every 6 (six) hours as needed for shortness of breath or wheezing.       atorvastatin (LIPITOR) 80 MG tablet Take 1 tablet by mouth every evening.       clopidogrel (PLAVIX) 75 MG tablet Take 1 tablet by mouth once daily 90 tablet 0   Cyanocobalamin (B-12 PO) Take 1 tablet by mouth every evening.       finasteride (PROSCAR) 5 MG tablet Take 5 mg by mouth every evening.       lisinopril (ZESTRIL) 20 MG tablet Take 1 tablet by mouth daily.       memantine (NAMENDA) 10 MG tablet Take 1 tablet (10 mg total) by mouth 2 (two) times daily. 60 tablet 3   donepezil (ARICEPT) 5 MG tablet Take 5 mg by mouth at bedtime. (Patient not taking: Reported on 06/24/2023)       venlafaxine XR (EFFEXOR-XR) 75 MG 24 hr capsule Take 75 mg by mouth daily with breakfast. (Patient not taking: Reported on 06/24/2023)              Home: Home Living Family/patient expects to be discharged to:: Private residence Living Arrangements: Spouse/significant other Available Help at Discharge: Family, Friend(s) Type of Home: House Home Layout: One level Bathroom Shower/Tub: Sponge bathes at baseline [employer]: Standard Home Equipment: Agricultural consultant (2 wheels), Other (comment) (lift chair) Additional Comments: Wife unable to provide physical assistance  Functional History: Prior Function Prior Level of Function : Patient poor historian/Family not  available, Needs assist Mobility Comments: uses RW for mobility ADLs Comments: sponge bathing for a few years per grandson, able to dress self. wife does IADLs, neither drive so friends assist with groceries Functional Status:  Mobility: Bed Mobility Overal bed mobility: Needs Assistance Bed Mobility: Supine to Sit Supine to sit: Mod assist, HOB elevated General bed mobility comments: In recliner Transfers Overall transfer level: Needs assistance Equipment used: Rolling walker (2 wheels), 1 person hand held assist Transfers: Sit to/from Stand Sit to Stand: Mod assist General transfer comment: Mod assist for boost to stand with initial attempt no AD, hand held assist. With RW for support,  required min assist for boost, Cues for hand placement. Ambulation/Gait Ambulation/Gait assistance: Min assist Gait Distance (Feet): 32 Feet Assistive device: Rolling walker (2 wheels) Gait Pattern/deviations: Step-through pattern, Decreased stride length, Decreased weight shift to right, Shuffle, Drifts right/left, Trunk flexed General Gait Details: Min assist for RW control initial half of distance covered. Cues for larger step length and foot clearance. Minor drift, started to self correct. Cues for awareness. No overt buckling. Able to maintain grasp on RW throughout. Gait velocity: decr Gait velocity interpretation: <1.31 ft/sec, indicative of household ambulator   ADL: ADL Overall ADL's : Needs assistance/impaired Eating/Feeding: Set up Grooming: Min guard, Standing, Oral care, Wash/dry face Grooming Details (indicate cue type and reason): some assist for balance with posterior balance when standing unsupported Upper Body Bathing: Minimal assistance, Sitting Lower Body Bathing: Moderate assistance, Sit to/from stand Upper Body Dressing : Minimal assistance, Sitting Lower Body Dressing: Moderate assistance, Sit to/from stand Toilet Transfer: Minimal assistance, Ambulation, Rolling walker (2  wheels) Toileting- Clothing Manipulation and Hygiene: Moderate assistance, Sitting/lateral lean, Sit to/from stand Functional mobility during ADLs: Minimal assistance, Rolling walker (2 wheels) General ADL Comments: Pt with limitations due to cognitive deficits, weakness and difficulty manuevering RW   Cognition: Cognition Overall Cognitive Status: No family/caregiver present to determine baseline cognitive functioning (Very short term memory impairement. Unaware of deficits. Oriented to self. Cannot recall month/season, and year incorrect "2026". Oriented to situation several times during session but unable to recall when asked why he was hospitalized.) Orientation Level: Oriented to person, Oriented to place, Disoriented to time, Disoriented to situation Memory: Impaired Memory Impairment: Prospective memory (did not recall that he asked SLP to bring him coffee when eval was done) Problem Solving: Impaired Problem Solving Impairment: Functional basic Safety/Judgment: Impaired (pt states he wants to go home despite knowing he had a stroke and has weakness with speech and language deficits) Cognition Arousal/Alertness: Awake/alert Behavior During Therapy: WFL for tasks assessed/performed Overall Cognitive Status: No family/caregiver present to determine baseline cognitive functioning (Very short term memory impairement. Unaware of deficits. Oriented to self. Cannot recall month/season, and year incorrect "2026". Oriented to situation several times during session but unable to recall when asked why he was hospitalized.) General Comments: per grandson, pt with memory impairments (sometimes will forget grandson's name if he has not been by in a couple of days). pt A&Ox2 today, unable to report month until provided options (typically able to report this) and reports coming in due to pain - unable to report stroke symptoms. Follows directions with multimodal cues, assist for DME and safety   Blood  pressure (!) 171/106, pulse 63, temperature 97.9 F (36.6 C), temperature source Oral, resp. rate 18, height 5\' 11"  (1.803 m), weight 95 kg, SpO2 95%. Physical Exam Constitutional:      Comments: Pt lethargic but does arouse to conversation/verbal stimulation  HENT:     Right Ear: External ear normal.     Left Ear: External ear normal.     Mouth/Throat:     Pharynx: Oropharynx is clear.  Cardiovascular:     Rate and Rhythm: Normal rate.  Pulmonary:     Effort: Pulmonary effort is normal.  Abdominal:     Palpations: Abdomen is soft.  Musculoskeletal:        General: No swelling or tenderness.     Cervical back: Normal range of motion.  Skin:    General: Skin is warm.  Neurological:     Comments: Pt lethargic but arouses fairly easily. Oriented  to self and can answer some basic questions about home (wife is there, where he lives). Follows basic commands. Speech very dysarthric. RUE grossly 3 to 4-/5. RLE similar at 3/5. Moves left side fairly easily. Senses pain in all 4's. No resting abnl tone. DTR's tr to 1+.  Psychiatric:     Comments: Pt flat, cooperates        Lab Results Last 24 Hours       Results for orders placed or performed during the hospital encounter of 06/23/23 (from the past 24 hour(s))  CBC     Status: None    Collection Time: 06/25/23  3:12 AM  Result Value Ref Range    WBC 7.1 4.0 - 10.5 K/uL    RBC 4.34 4.22 - 5.81 MIL/uL    Hemoglobin 13.8 13.0 - 17.0 g/dL    HCT 16.1 09.6 - 04.5 %    MCV 92.2 80.0 - 100.0 fL    MCH 31.8 26.0 - 34.0 pg    MCHC 34.5 30.0 - 36.0 g/dL    RDW 40.9 81.1 - 91.4 %    Platelets 212 150 - 400 K/uL    nRBC 0.0 0.0 - 0.2 %  Glucose, capillary     Status: Abnormal    Collection Time: 06/25/23 12:05 PM  Result Value Ref Range    Glucose-Capillary 134 (H) 70 - 99 mg/dL    Comment 1 Notify RN      Comment 2 Document in Chart         Imaging Results (Last 48 hours)  ECHOCARDIOGRAM COMPLETE   Result Date: 06/24/2023     ECHOCARDIOGRAM REPORT   Patient Name:   AMARIYON MAYNES Date of Exam: 06/24/2023 Medical Rec #:  [id number]     Height:       71.0 in Accession #:    [id number]    Weight:       209.4 lb Date of Birth:  08-Aug-1930    BSA:          2.150 m Patient Age:    89 years      BP:           156/84 mmHg Patient Gender: M             HR:           71 bpm. Exam Location:  Inpatient Procedure: 2D Echo, Color Doppler and Cardiac Doppler Indications:    Stroke  History:        Patient has prior history of Echocardiogram examinations, most                 recent 01/04/2023. CAD, Aortic Valve Disease and s/p TAVR w/                 on 09/24/20; Risk Factors:Dyslipidemia and                 Hypertension.  Sonographer:    Milbert Coulter Referring Phys: [id number] Cecille Po MELVIN IMPRESSIONS  1. Left ventricular ejection fraction, by estimation, is 60 to 65%. The left ventricle has normal function. The left ventricle has no regional wall motion abnormalities. There is mild concentric left ventricular hypertrophy. Left ventricular diastolic parameters are consistent with Grade I diastolic dysfunction (impaired relaxation).  2. Right ventricular systolic function is normal. The right ventricular size is normal.  3. Left atrial size was mild to moderately dilated.  4. The mitral valve is normal in structure. No evidence of  mitral valve regurgitation. No evidence of mitral stenosis.  5. The aortic valve has been repaired/replaced. Aortic valve regurgitation is mild. No aortic stenosis is present. Echo findings are consistent with normal structure and function of the aortic valve prosthesis. Aortic valve mean gradient measures 9.0 mmHg. Aortic valve Vmax measures 2.10 m/s. FINDINGS  Left Ventricle: Left ventricular ejection fraction, by estimation, is 60 to 65%. The left ventricle has normal function. The left ventricle has no regional wall motion abnormalities. The left ventricular internal cavity size was normal in size. There is   mild concentric left ventricular hypertrophy. Left ventricular diastolic parameters are consistent with Grade I diastolic dysfunction (impaired relaxation). Right Ventricle: The right ventricular size is normal. No increase in right ventricular wall thickness. Right ventricular systolic function is normal. Left Atrium: Left atrial size was mild to moderately dilated. Right Atrium: Right atrial size was normal in size. Pericardium: There is no evidence of pericardial effusion. Mitral Valve: The mitral valve is normal in structure. No evidence of mitral valve regurgitation. No evidence of mitral valve stenosis. Tricuspid Valve: The tricuspid valve is normal in structure. Tricuspid valve regurgitation is mild . No evidence of tricuspid stenosis. Aortic Valve: The aortic valve has been repaired/replaced. Aortic valve regurgitation is mild. Aortic regurgitation PHT measures 611 msec. No aortic stenosis is present. Aortic valve mean gradient measures 9.0 mmHg. Aortic valve peak gradient measures 17.6 mmHg. There is a 26 mm Sapien prosthetic, stented (TAVR) valve present in the aortic position. Echo findings are consistent with normal structure and function of the aortic valve prosthesis. Pulmonic Valve: The pulmonic valve was normal in structure. Pulmonic valve regurgitation is trivial. No evidence of pulmonic stenosis. Aorta: The aortic root is normal in size and structure. Venous: The inferior vena cava was not well visualized. IAS/Shunts: No atrial level shunt detected by color flow Doppler.  LEFT VENTRICLE PLAX 2D LVIDd:         3.50 cm Diastology LVIDs:         2.80 cm LV e' medial:    5.55 cm/s LV PW:         1.40 cm LV E/e' medial:  8.0 LV IVS:        1.40 cm LV e' lateral:   7.18 cm/s                        LV E/e' lateral: 6.2  RIGHT VENTRICLE RV S prime:     11.60 cm/s TAPSE (M-mode): 1.7 cm LEFT ATRIUM             Index        RIGHT ATRIUM           Index LA Vol (A2C):   58.4 ml 27.16 ml/m  RA Area:     17.30  cm LA Vol (A4C):   52.2 ml 24.28 ml/m  RA Volume:   45.20 ml  21.02 ml/m LA Biplane Vol: 56.9 ml 26.46 ml/m  AORTIC VALVE AV Vmax:           210.00 cm/s AV Vmean:          139.000 cm/s AV VTI:            0.407 m AV Peak Grad:      17.6 mmHg AV Mean Grad:      9.0 mmHg LVOT Vmax:         93.80 cm/s LVOT Vmean:        61.300 cm/s  LVOT VTI:          0.202 m LVOT/AV VTI ratio: 0.50 AI PHT:            611 msec  AORTA Ao Asc diam: 3.30 cm MITRAL VALVE               TRICUSPID VALVE MV Area (PHT): 3.53 cm    TR Peak grad:   22.8 mmHg MV Decel Time: 215 msec    TR Vmax:        239.00 cm/s MV E velocity: 44.50 cm/s MV A velocity: 99.60 cm/s  SHUNTS MV E/A ratio:  0.45        Systemic VTI: 0.20 m Arvilla Meres MD Electronically signed by Arvilla Meres MD Signature Date/Time: 06/24/2023/1:04:46 PM    Final     MR BRAIN WO CONTRAST   Result Date: 06/23/2023 CLINICAL DATA:  Acute neurologic deficit EXAM: MRI HEAD WITHOUT CONTRAST TECHNIQUE: Multiplanar, multiecho pulse sequences of the brain and surrounding structures were obtained without intravenous contrast. COMPARISON:  01/04/2023 FINDINGS: Brain: Small area of abnormal diffusion restriction of the left corpus striatum. No acute or chronic hemorrhage. There is multifocal hyperintense T2-weighted signal within the white matter. Generalized volume loss. Old right parietal and occipital infarcts. Old small vessel infarcts of the basal ganglia, cerebellum and corona radiata. The midline structures are normal. Vascular: Major flow voids are preserved. Skull and upper cervical spine: Normal calvarium and skull base. Visualized upper cervical spine and soft tissues are normal. Sinuses/Orbits:Mild maxillary sinus mucosal thickening. Normal orbits. IMPRESSION: 1. Small area of acute ischemia of the left corpus striatum. No hemorrhage or mass effect. 2. Old right parietal and occipital infarcts. 3. Old small vessel infarcts of the basal ganglia, cerebellum and corona  radiata. Electronically Signed   By: Deatra Robinson M.D.   On: 06/23/2023 20:25    CT ANGIO HEAD NECK W WO CM (CODE STROKE)   Result Date: 06/23/2023 CLINICAL DATA:  Neuro deficit, acute, stroke suspected. Right-sided deficit. EXAM: CT ANGIOGRAPHY HEAD AND NECK WITH AND WITHOUT CONTRAST TECHNIQUE: Multidetector CT imaging of the head and neck was performed using the standard protocol during bolus administration of intravenous contrast. Multiplanar CT image reconstructions and MIPs were obtained to evaluate the vascular anatomy. Carotid stenosis measurements (when applicable) are obtained utilizing NASCET criteria, using the distal internal carotid diameter as the denominator. RADIATION DOSE REDUCTION: This exam was performed according to the departmental dose-optimization program which includes automated exposure control, adjustment of the mA and/or kV according to patient size and/or use of iterative reconstruction technique. CONTRAST:  75mL OMNIPAQUE IOHEXOL 350 MG/ML SOLN COMPARISON:  Head CT 06/23/2023. CTA head 01/04/2023. MRI brain 08/26/2019. FINDINGS: CTA NECK FINDINGS Aortic arch: Four vessel arch configuration with separate origin of the left vertebral artery. Atherosclerotic calcifications of the aortic arch and arch vessel origins. Arch vessel origins are patent. Right carotid system: No evidence of dissection, stenosis (50% or greater), or occlusion. Calcified plaque of the right carotid bulb. Tortuous distal cervical ICA. Left carotid system: No evidence of dissection, stenosis (50% or greater), or occlusion. Mixed plaque of the left carotid bulb. Mildly tortuous distal cervical ICA. Vertebral arteries: Codominant. No evidence of dissection, stenosis (50% or greater), or occlusion. Skeleton: Mild cervical spondylosis without high-grade spinal canal stenosis. Other neck: Unremarkable. Upper chest: Unremarkable. Review of the MIP images confirms the above findings CTA HEAD FINDINGS Anterior  circulation: Calcified plaque along the carotid siphons without hemodynamically significant stenosis. The proximal ACAs and MCAs  are patent without stenosis or aneurysm. Distal branches are symmetric. Posterior circulation: Normal basilar artery. The SCAs, AICAs and PICAs are patent proximally. The PCAs are patent proximally without stenosis or aneurysm. Distal branches are symmetric. Venous sinuses: As permitted by contrast timing, patent. Anatomic variants: Persistent fetal origin of the left PCA with hypoplastic left P1 segment. Review of the MIP images confirms the above findings IMPRESSION: No large vessel occlusion, hemodynamically significant stenosis, or aneurysm in the head or neck. Aortic Atherosclerosis (ICD10-I70.0). Electronically Signed   By: Orvan Falconer M.D.   On: 06/23/2023 17:15    CT HEAD CODE STROKE WO CONTRAST   Result Date: 06/23/2023 CLINICAL DATA:  Code stroke. Neuro deficit, acute, stroke suspected. Slurred speech and right-sided weakness. EXAM: CT HEAD WITHOUT CONTRAST TECHNIQUE: Contiguous axial images were obtained from the base of the skull through the vertex without intravenous contrast. RADIATION DOSE REDUCTION: This exam was performed according to the departmental dose-optimization program which includes automated exposure control, adjustment of the mA and/or kV according to patient size and/or use of iterative reconstruction technique. COMPARISON:  Head CT 01/04/2023. FINDINGS: Brain: New asymmetric hypoattenuation in the posterior limb of the left internal capsule, suspicious for acute infarct. Old infarct in the left thalamus. Background of moderate chronic small-vessel disease. No hydrocephalus or extra-axial collection. No mass effect or midline shift. Vascular: No hyperdense vessel or unexpected calcification. Skull: No calvarial fracture or suspicious bone lesion. Skull base is unremarkable. Sinuses/Orbits: Mild mucosal disease in the maxillary and frontal sinuses.  Orbits are unremarkable. Other: None. ASPECTS ([place] Stroke Program Early CT Score) - Ganglionic level infarction (caudate, lentiform nuclei, internal capsule, insula, M1-M3 cortex): 6 - Supraganglionic infarction (M4-M6 cortex): 3 Total score (0-10 with 10 being normal): 9 IMPRESSION: 1. New asymmetric hypoattenuation in the posterior limb of the left internal capsule, suspicious for acute infarct. No hemorrhage or mass effect. 2. ASPECT score is 9. Code stroke imaging results were communicated on 06/23/2023 at 5:09 pm to provider Dr. Derry Lory via secure text paging. Electronically Signed   By: Orvan Falconer M.D.   On: 06/23/2023 17:09       Assessment/Plan: Diagnosis: 89 yo male with new left corpus striatum infarct (prior left brain infarcts) Does the need for close, 24 hr/day medical supervision in concert with the patient's rehab needs make it unreasonable for this patient to be served in a less intensive setting? Yes Co-Morbidities requiring supervision/potential complications:  -BPH/bladder function -MI/HTN -hx of AS/TAVR Due to bladder management, bowel management, safety, skin/wound care, disease management, medication administration, pain management, and patient education, does the patient require 24 hr/day rehab nursing? Yes Does the patient require coordinated care of a physician, rehab nurse, therapy disciplines of PT, OT, SLP to address physical and functional deficits in the context of the above medical diagnosis(es)? Yes Addressing deficits in the following areas: balance, endurance, locomotion, strength, transferring, bowel/bladder control, bathing, dressing, feeding, grooming, toileting, cognition, speech, swallowing, and psychosocial support Can the patient actively participate in an intensive therapy program of at least 3 hrs of therapy per day at least 5 days per week? Yes The potential for patient to make measurable gains while on inpatient rehab is excellent Anticipated  functional outcomes upon discharge from inpatient rehab are supervision  with PT, supervision with OT, supervision with SLP. Estimated rehab length of stay to reach the above functional goals is: 12-17 days Anticipated discharge destination: Home Overall Rehab/Functional Prognosis: excellent   POST ACUTE RECOMMENDATIONS: This patient's condition is appropriate for  continued rehabilitative care in the following setting: CIR Patient has agreed to participate in recommended program. Yes Note that insurance prior authorization may be required for reimbursement for recommended care.   Comment: Rehab Admissions Coordinator to follow up          I have personally performed a face to face diagnostic evaluation of this patient. Additionally, I have examined the patient's medical record including any pertinent labs and radiographic images. If the physician assistant has documented in this note, I have reviewed and edited or otherwise concur with the physician assistant's documentation.   Thanks,   Ranelle Oyster, MD 06/25/2023

## 2023-07-01 NOTE — Discharge Summary (Signed)
Physician Discharge Summary  BRISON FIUMARA ZOX:[id number] DOB: 03-01-1930 DOA: 06/23/2023  PCP: Barron Alvine, MD  Admit date: 06/23/2023 Discharge date: 07/01/2023  Admitted From: Home Disposition: Acute inpatient rehab  Recommendations for Outpatient Follow-up:  Follow up with PCP in 1-2 weeks Follow-up with neurology, will send referral  discharge Condition: Fair CODE STATUS: Full code Diet recommendation: Low-salt diet, nutritional supplements  Discharge summary: 89 year old with history of hypertension, hyperlipidemia, GERD, depression, BPH, status post TAVR, CAD status post stent presents from home with right-sided weakness and facial droop.  He was found to have acute stroke.  Patient does have generalized debility and recently worsening left-sided weakness.     Assessment & Plan:   Acute left MCA territory stroke: Clinical findings, woke up confused in the afternoon, slow to respond, garbled speech and worsening of his facial droop. CT head findings, new asymmetric hypoattenuation in the posterior left internal capsule MRI of the brain, acute ischemia of the left corpus stratum, old right parietal and occipital infarcts CT angiogram head and neck, no large vessel occlusion 2D echocardiogram, normal ejection fraction, grade 1 diastolic dysfunction, no intracardiac thrombus. Antiplatelet therapy, on Plavix 75 mg daily before admission.  Now on aspirin and Brilinta for 4 weeks then aspirin alone. LDL 50, Lipitor 80 mg daily Hemoglobin A1c, 5.8.  No indication for treatment.    Chronic medical issues including Essential hypertension, lisinopril and amlodipine. Hyperlipidemia, Lipitor as above Memory impairment, on Namenda History of coronary artery disease, on aspirin Plavix and statin.  Plavix substituted with Brilinta. Aortic stenosis status post TAVR: Stable.  Stable to transfer to acute inpatient rehab for comprehensive rehab.   Discharge Diagnoses:  Principal  Problem:   Acute CVA (cerebrovascular accident) [facility]) Active Problems:   Coronary artery disease with PCI and stenting many years ago   Symptomatic severe aortic stenosis with normal ejection fraction   S/P TAVR (transcatheter aortic valve replacement)   HTN (hypertension)   Depression   Gastroesophageal reflux disease   History of CVA (cerebrovascular accident)   Pure hypercholesterolemia   BPH (benign prostatic hyperplasia)    Discharge Instructions  Discharge Instructions     Ambulatory referral to Neurology   Complete by: As directed    An appointment is requested in approximately: 4 weeks   Diet - low sodium heart healthy   Complete by: As directed    Increase activity slowly   Complete by: As directed       Allergies as of 07/01/2023   No Known Allergies      Medication List     STOP taking these medications    clopidogrel 75 MG tablet Commonly known as: PLAVIX   donepezil 5 MG tablet Commonly known as: ARICEPT   venlafaxine XR 75 MG 24 hr capsule Commonly known as: EFFEXOR-XR       TAKE these medications    acetaminophen 650 MG CR tablet Commonly known as: TYLENOL Take 325 mg by mouth every 8 (eight) hours as needed for pain.   albuterol 108 (90 Base) MCG/ACT inhaler Commonly known as: VENTOLIN HFA Inhale 1 puff into the lungs every 6 (six) hours as needed for shortness of breath or wheezing.   amLODipine 5 MG tablet Commonly known as: NORVASC Take 1 tablet (5 mg total) by mouth daily. Start taking on: July 02, 2023   aspirin EC 81 MG tablet Take 1 tablet (81 mg total) by mouth daily. Swallow whole. Start taking on: July 02, 2023   atorvastatin 80 MG  tablet Commonly known as: LIPITOR Take 1 tablet by mouth every evening.   B-12 PO Take 1 tablet by mouth every evening.   calcium carbonate 500 MG chewable tablet Commonly known as: TUMS - dosed in mg elemental calcium Chew 1 tablet (200 mg of elemental calcium total) by mouth 3 (three)  times daily with meals.   docusate sodium 100 MG capsule Commonly known as: COLACE Take 1 capsule (100 mg total) by mouth 2 (two) times daily.   finasteride 5 MG tablet Commonly known as: PROSCAR Take 5 mg by mouth every evening.   lisinopril 20 MG tablet Commonly known as: ZESTRIL Take 1 tablet by mouth daily.   memantine 10 MG tablet Commonly known as: Namenda Take 1 tablet (10 mg total) by mouth 2 (two) times daily.   polyethylene glycol 17 g packet Commonly known as: MIRALAX / GLYCOLAX Take 17 g by mouth daily. Start taking on: July 02, 2023   ticagrelor 90 MG Tabs tablet Commonly known as: BRILINTA Take 1 tablet (90 mg total) by mouth 2 (two) times daily for 25 days.        No Known Allergies  Consultations: Neurology   Procedures/Studies: ECHOCARDIOGRAM COMPLETE  Result Date: 06/24/2023    ECHOCARDIOGRAM REPORT   Patient Name:   Michael Kidd Date of Exam: 06/24/2023 Medical Rec #:  [id number]     Height:       71.0 in Accession #:    [id number]    Weight:       209.4 lb Date of Birth:  March 24, 1930    BSA:          2.150 m Patient Age:    89 years      BP:           156/84 mmHg Patient Gender: M             HR:           71 bpm. Exam Location:  Inpatient Procedure: 2D Echo, Color Doppler and Cardiac Doppler Indications:    Stroke  History:        Patient has prior history of Echocardiogram examinations, most                 recent 01/04/2023. CAD, Aortic Valve Disease and s/p TAVR w/                 on 09/24/20; Risk Factors:Dyslipidemia and                 Hypertension.  Sonographer:    Milbert Coulter Referring Phys: [id number] Cecille Po MELVIN IMPRESSIONS  1. Left ventricular ejection fraction, by estimation, is 60 to 65%. The left ventricle has normal function. The left ventricle has no regional wall motion abnormalities. There is mild concentric left ventricular hypertrophy. Left ventricular diastolic parameters are consistent with Grade I diastolic dysfunction  (impaired relaxation).  2. Right ventricular systolic function is normal. The right ventricular size is normal.  3. Left atrial size was mild to moderately dilated.  4. The mitral valve is normal in structure. No evidence of mitral valve regurgitation. No evidence of mitral stenosis.  5. The aortic valve has been repaired/replaced. Aortic valve regurgitation is mild. No aortic stenosis is present. Echo findings are consistent with normal structure and function of the aortic valve prosthesis. Aortic valve mean gradient measures 9.0 mmHg. Aortic valve Vmax measures 2.10 m/s. FINDINGS  Left Ventricle: Left ventricular ejection fraction, by estimation, is 60 to  65%. The left ventricle has normal function. The left ventricle has no regional wall motion abnormalities. The left ventricular internal cavity size was normal in size. There is  mild concentric left ventricular hypertrophy. Left ventricular diastolic parameters are consistent with Grade I diastolic dysfunction (impaired relaxation). Right Ventricle: The right ventricular size is normal. No increase in right ventricular wall thickness. Right ventricular systolic function is normal. Left Atrium: Left atrial size was mild to moderately dilated. Right Atrium: Right atrial size was normal in size. Pericardium: There is no evidence of pericardial effusion. Mitral Valve: The mitral valve is normal in structure. No evidence of mitral valve regurgitation. No evidence of mitral valve stenosis. Tricuspid Valve: The tricuspid valve is normal in structure. Tricuspid valve regurgitation is mild . No evidence of tricuspid stenosis. Aortic Valve: The aortic valve has been repaired/replaced. Aortic valve regurgitation is mild. Aortic regurgitation PHT measures 611 msec. No aortic stenosis is present. Aortic valve mean gradient measures 9.0 mmHg. Aortic valve peak gradient measures 17.6 mmHg. There is a 26 mm Sapien prosthetic, stented (TAVR) valve present in the aortic position.  Echo findings are consistent with normal structure and function of the aortic valve prosthesis. Pulmonic Valve: The pulmonic valve was normal in structure. Pulmonic valve regurgitation is trivial. No evidence of pulmonic stenosis. Aorta: The aortic root is normal in size and structure. Venous: The inferior vena cava was not well visualized. IAS/Shunts: No atrial level shunt detected by color flow Doppler.  LEFT VENTRICLE PLAX 2D LVIDd:         3.50 cm Diastology LVIDs:         2.80 cm LV e' medial:    5.55 cm/s LV PW:         1.40 cm LV E/e' medial:  8.0 LV IVS:        1.40 cm LV e' lateral:   7.18 cm/s                        LV E/e' lateral: 6.2  RIGHT VENTRICLE RV S prime:     11.60 cm/s TAPSE (M-mode): 1.7 cm LEFT ATRIUM             Index        RIGHT ATRIUM           Index LA Vol (A2C):   58.4 ml 27.16 ml/m  RA Area:     17.30 cm LA Vol (A4C):   52.2 ml 24.28 ml/m  RA Volume:   45.20 ml  21.02 ml/m LA Biplane Vol: 56.9 ml 26.46 ml/m  AORTIC VALVE AV Vmax:           210.00 cm/s AV Vmean:          139.000 cm/s AV VTI:            0.407 m AV Peak Grad:      17.6 mmHg AV Mean Grad:      9.0 mmHg LVOT Vmax:         93.80 cm/s LVOT Vmean:        61.300 cm/s LVOT VTI:          0.202 m LVOT/AV VTI ratio: 0.50 AI PHT:            611 msec  AORTA Ao Asc diam: 3.30 cm MITRAL VALVE               TRICUSPID VALVE MV Area (PHT): 3.53 cm    TR Peak  grad:   22.8 mmHg MV Decel Time: 215 msec    TR Vmax:        239.00 cm/s MV E velocity: 44.50 cm/s MV A velocity: 99.60 cm/s  SHUNTS MV E/A ratio:  0.45        Systemic VTI: 0.20 m Arvilla Meres MD Electronically signed by Arvilla Meres MD Signature Date/Time: 06/24/2023/1:04:46 PM    Final    MR BRAIN WO CONTRAST  Result Date: 06/23/2023 CLINICAL DATA:  Acute neurologic deficit EXAM: MRI HEAD WITHOUT CONTRAST TECHNIQUE: Multiplanar, multiecho pulse sequences of the brain and surrounding structures were obtained without intravenous contrast. COMPARISON:  01/04/2023  FINDINGS: Brain: Small area of abnormal diffusion restriction of the left corpus striatum. No acute or chronic hemorrhage. There is multifocal hyperintense T2-weighted signal within the white matter. Generalized volume loss. Old right parietal and occipital infarcts. Old small vessel infarcts of the basal ganglia, cerebellum and corona radiata. The midline structures are normal. Vascular: Major flow voids are preserved. Skull and upper cervical spine: Normal calvarium and skull base. Visualized upper cervical spine and soft tissues are normal. Sinuses/Orbits:Mild maxillary sinus mucosal thickening. Normal orbits. IMPRESSION: 1. Small area of acute ischemia of the left corpus striatum. No hemorrhage or mass effect. 2. Old right parietal and occipital infarcts. 3. Old small vessel infarcts of the basal ganglia, cerebellum and corona radiata. Electronically Signed   By: Deatra Robinson M.D.   On: 06/23/2023 20:25   CT ANGIO HEAD NECK W WO CM (CODE STROKE)  Result Date: 06/23/2023 CLINICAL DATA:  Neuro deficit, acute, stroke suspected. Right-sided deficit. EXAM: CT ANGIOGRAPHY HEAD AND NECK WITH AND WITHOUT CONTRAST TECHNIQUE: Multidetector CT imaging of the head and neck was performed using the standard protocol during bolus administration of intravenous contrast. Multiplanar CT image reconstructions and MIPs were obtained to evaluate the vascular anatomy. Carotid stenosis measurements (when applicable) are obtained utilizing NASCET criteria, using the distal internal carotid diameter as the denominator. RADIATION DOSE REDUCTION: This exam was performed according to the departmental dose-optimization program which includes automated exposure control, adjustment of the mA and/or kV according to patient size and/or use of iterative reconstruction technique. CONTRAST:  75mL OMNIPAQUE IOHEXOL 350 MG/ML SOLN COMPARISON:  Head CT 06/23/2023. CTA head 01/04/2023. MRI brain 08/26/2019. FINDINGS: CTA NECK FINDINGS Aortic  arch: Four vessel arch configuration with separate origin of the left vertebral artery. Atherosclerotic calcifications of the aortic arch and arch vessel origins. Arch vessel origins are patent. Right carotid system: No evidence of dissection, stenosis (50% or greater), or occlusion. Calcified plaque of the right carotid bulb. Tortuous distal cervical ICA. Left carotid system: No evidence of dissection, stenosis (50% or greater), or occlusion. Mixed plaque of the left carotid bulb. Mildly tortuous distal cervical ICA. Vertebral arteries: Codominant. No evidence of dissection, stenosis (50% or greater), or occlusion. Skeleton: Mild cervical spondylosis without high-grade spinal canal stenosis. Other neck: Unremarkable. Upper chest: Unremarkable. Review of the MIP images confirms the above findings CTA HEAD FINDINGS Anterior circulation: Calcified plaque along the carotid siphons without hemodynamically significant stenosis. The proximal ACAs and MCAs are patent without stenosis or aneurysm. Distal branches are symmetric. Posterior circulation: Normal basilar artery. The SCAs, AICAs and PICAs are patent proximally. The PCAs are patent proximally without stenosis or aneurysm. Distal branches are symmetric. Venous sinuses: As permitted by contrast timing, patent. Anatomic variants: Persistent fetal origin of the left PCA with hypoplastic left P1 segment. Review of the MIP images confirms the above findings IMPRESSION: No large vessel  occlusion, hemodynamically significant stenosis, or aneurysm in the head or neck. Aortic Atherosclerosis (ICD10-I70.0). Electronically Signed   By: Orvan Falconer M.D.   On: 06/23/2023 17:15   CT HEAD CODE STROKE WO CONTRAST  Result Date: 06/23/2023 CLINICAL DATA:  Code stroke. Neuro deficit, acute, stroke suspected. Slurred speech and right-sided weakness. EXAM: CT HEAD WITHOUT CONTRAST TECHNIQUE: Contiguous axial images were obtained from the base of the skull through the vertex  without intravenous contrast. RADIATION DOSE REDUCTION: This exam was performed according to the departmental dose-optimization program which includes automated exposure control, adjustment of the mA and/or kV according to patient size and/or use of iterative reconstruction technique. COMPARISON:  Head CT 01/04/2023. FINDINGS: Brain: New asymmetric hypoattenuation in the posterior limb of the left internal capsule, suspicious for acute infarct. Old infarct in the left thalamus. Background of moderate chronic small-vessel disease. No hydrocephalus or extra-axial collection. No mass effect or midline shift. Vascular: No hyperdense vessel or unexpected calcification. Skull: No calvarial fracture or suspicious bone lesion. Skull base is unremarkable. Sinuses/Orbits: Mild mucosal disease in the maxillary and frontal sinuses. Orbits are unremarkable. Other: None. ASPECTS (Alberta Stroke Program Early CT Score) - Ganglionic level infarction (caudate, lentiform nuclei, internal capsule, insula, M1-M3 cortex): 6 - Supraganglionic infarction (M4-M6 cortex): 3 Total score (0-10 with 10 being normal): 9 IMPRESSION: 1. New asymmetric hypoattenuation in the posterior limb of the left internal capsule, suspicious for acute infarct. No hemorrhage or mass effect. 2. ASPECT score is 9. Code stroke imaging results were communicated on 06/23/2023 at 5:09 pm to provider Dr. Derry Lory via secure text paging. Electronically Signed   By: Orvan Falconer M.D.   On: 06/23/2023 17:09   CUP PACEART REMOTE DEVICE CHECK  Result Date: 06/15/2023 ILR summary report received. Battery status OK. Normal device function. No new symptom, tachy, brady, or pause episodes. No new AF episodes. Monthly summary reports and ROV/PRN LA, CVRS  (Echo, Carotid, EGD, Colonoscopy, ERCP)    Subjective: Patient seen and examined.  Hard of hearing.  Denies any complaints.  Looks anxious.   Discharge Exam: Vitals:   07/01/23 0421 07/01/23 0810  BP: 132/64  133/70  Pulse: 72 74  Resp: 18   Temp: 98.3 F (36.8 C) 97.9 F (36.6 C)  SpO2: 96% 94%   Vitals:   07/01/23 0013 07/01/23 0337 07/01/23 0421 07/01/23 0810  BP: (!) 110/52 132/64 132/64 133/70  Pulse: 61 (!) 58 72 74  Resp: 16 17 18    Temp: 98.2 F (36.8 C) 98.3 F (36.8 C) 98.3 F (36.8 C) 97.9 F (36.6 C)  TempSrc: Oral Oral Oral Oral  SpO2: (!) 83% 97% 96% 94%  Weight:      Height:        General: Pt is alert, awake, not in acute distress Slightly anxious.  He is calm and comfortable. Cardiovascular: RRR, S1/S2 +, no rubs, no gallops Respiratory: CTA bilaterally, no wheezing, no rhonchi Abdominal: Soft, NT, ND, bowel sounds + Extremities: no edema, no cyanosis Hide appearing.  Speech is clear.  He has right-sided facial droop.  He has mild right-sided motor weakness 4/5.  He has chronic weakness of the left upper and lower extremity.    The results of significant diagnostics from this hospitalization (including imaging, microbiology, ancillary and laboratory) are listed below for reference.     Microbiology: No results found for this or any previous visit (from the past 240 hour(s)).   Labs: BNP (last 3 results) No results for input(s): "BNP" in  the last 8760 hours. Basic Metabolic Panel: Recent Labs  Lab 06/25/23 0312 06/27/23 0415  NA 139 140  K 3.1* 3.9  CL 105 112*  CO2 24 21*  GLUCOSE 116* 116*  BUN 14 21  CREATININE 1.03 1.05  CALCIUM 8.3* 8.2*  MG 2.2  --    Liver Function Tests: No results for input(s): "AST", "ALT", "ALKPHOS", "BILITOT", "PROT", "ALBUMIN" in the last 168 hours. No results for input(s): "LIPASE", "AMYLASE" in the last 168 hours. No results for input(s): "AMMONIA" in the last 168 hours. CBC: Recent Labs  Lab 06/25/23 0312  WBC 7.1  HGB 13.8  HCT 40.0  MCV 92.2  PLT 212   Cardiac Enzymes: No results for input(s): "CKTOTAL", "CKMB", "CKMBINDEX", "TROPONINI" in the last 168 hours. BNP: Invalid input(s):  "POCBNP" CBG: Recent Labs  Lab 06/25/23 1205  GLUCAP 134*   D-Dimer No results for input(s): "DDIMER" in the last 72 hours. Hgb A1c No results for input(s): "HGBA1C" in the last 72 hours. Lipid Profile No results for input(s): "CHOL", "HDL", "LDLCALC", "TRIG", "CHOLHDL", "LDLDIRECT" in the last 72 hours. Thyroid function studies No results for input(s): "TSH", "T4TOTAL", "T3FREE", "THYROIDAB" in the last 72 hours.  Invalid input(s): "FREET3" Anemia work up No results for input(s): "VITAMINB12", "FOLATE", "FERRITIN", "TIBC", "IRON", "RETICCTPCT" in the last 72 hours. Urinalysis    Component Value Date/Time   COLORURINE YELLOW 09/20/2020 1134   APPEARANCEUR CLEAR 09/20/2020 1134   LABSPEC 1.023 09/20/2020 1134   PHURINE 5.0 09/20/2020 1134   GLUCOSEU NEGATIVE 09/20/2020 1134   HGBUR NEGATIVE 09/20/2020 1134   BILIRUBINUR NEGATIVE 09/20/2020 1134   KETONESUR NEGATIVE 09/20/2020 1134   PROTEINUR NEGATIVE 09/20/2020 1134   NITRITE NEGATIVE 09/20/2020 1134   LEUKOCYTESUR NEGATIVE 09/20/2020 1134   Sepsis Labs Recent Labs  Lab 06/25/23 0312  WBC 7.1   Microbiology No results found for this or any previous visit (from the past 240 hour(s)).   Time coordinating discharge:  35 minutes  SIGNED:   Dorcas Carrow, MD  [facility] 07/01/2023, 10:25 AM

## 2023-07-01 NOTE — Plan of Care (Signed)

## 2023-07-01 NOTE — TOC Transition Note (Signed)
Transition of Care [facility]) - CM/SW Discharge Note   Patient Details  Name: Michael Kidd MRN: [id number] Date of Birth: 04-Nov-1930  [facility] CM/SW Contact:  Kermit Balo, RN Phone Number: 07/01/2023, 11:10 AM   Clinical Narrative:    Pt has won his appeal. He is discharging to CIR today. CM signing off.   Final next level of care: IP Rehab Facility Barriers to Discharge: No Barriers Identified   Patient Goals and CMS Choice CMS Medicare.gov Compare Post Acute Care list provided to:: Patient Choice offered to / list presented to : Patient  Discharge Placement                         Discharge Plan and Services Additional resources added to the After Visit Summary for                                       Social Determinants of Health (SDOH) Interventions SDOH Screenings   Food Insecurity: No Food Insecurity (06/23/2023)  Housing: Low Risk  (06/23/2023)  Transportation Needs: No Transportation Needs (06/23/2023)  Utilities: Not At Risk (06/23/2023)  Tobacco Use: Medium Risk (06/23/2023)     Readmission Risk Interventions     No data to display

## 2023-07-01 NOTE — H&P (Signed)
Physical Medicine and Rehabilitation Admission H&P        Chief Complaint  Patient presents with   Code Stroke  : HPI: Michael Kidd is a 89 year old right-handed male with history of hypertension, BPH, aortic stenosis status post TAVR 2021, CAD with stenting, hyperlipidemia, tobacco use, history of CVA January 2024 maintained on Plavix with workup completed at [facility] also has mild dementia on Namenda; depression.  Per chart review patient lives with spouse.  1 level at home.  Wife assist with ADLs.  Presented 06/23/2023 with right side side weakness and aphasia.  CT/MRI showed small area of acute ischemia of the left corpus stratum.  No hemorrhage or mass effect.  Old right parietal and occipital infarcts as well as old small vessel infarction of the basal ganglia, cerebellum and corona radiata.  CTA showed no large vessel occlusion or hemodynamically significant stenosis.  Admission chemistries unremarkable except potassium 3.4, glucose 136, hemoglobin A1c 5.7.  Echocardiogram with ejection fraction of 60 to 65% no wall motion abnormalities grade 1 diastolic dysfunction.  Neurology follow-up currently maintained on low-dose aspirin with Brilinta 90 mg twice daily.  Lovenox added for DVT prophylaxis.  Tolerating a regular consistency diet.  Therapy evaluations completed due to patient decreased functional mobility and right-sided weakness was admitted for a comprehensive rehab program.     Pt reports "feel terrible"- due to his weakness- his only concern is getting home.  He thinks his LBM was yesterday- he also appears to have been incontinent of bladder on 3W due to his stroke. Hard for nursing to get to him fast enough to be continent.        Review of Systems  Constitutional:  Negative for chills and fever.  HENT:  Negative for hearing loss.   Eyes:  Negative for blurred vision and double vision.  Respiratory:  Negative for cough, shortness of breath and wheezing.    Cardiovascular:  Positive for leg swelling. Negative for chest pain and palpitations.  Gastrointestinal:  Positive for constipation. Negative for heartburn, nausea and vomiting.  Genitourinary:  Positive for urgency. Negative for dysuria and hematuria.  Musculoskeletal:  Positive for joint pain and myalgias.  Skin:  Negative for rash.  Neurological:  Positive for dizziness, sensory change, speech change and weakness.  Psychiatric/Behavioral:  Positive for depression and memory loss.   All other systems reviewed and are negative.       Past Medical History:  Diagnosis Date   Allergic rhinitis 03/29/2014   Aortic valve stenosis 05/23/2020   Arthralgia 07/15/2016   Arthritis     Benign prostatic hyperplasia 02/14/2014   Benign prostatic hyperplasia with urinary frequency 11/02/2018   Bilateral chronic knee pain     BPH (benign prostatic hyperplasia)     CAD (coronary artery disease)      s/p remote LAD PCI   Chronic ischemic heart disease 08/13/2010   Chronic pain of both knees 09/01/2017   Coronary artery disease with PCI and stenting many years ago 10/19/2019   Depression 02/14/2014    Formatting of this note might be different from the original. PHQ-9 completed 02/26/14 PHQ-2 completed 02/14/14   Dizziness 03/12/2014   Dupuytren's contracture 07/15/2016   Dyspnea on exertion 08/15/2020   Dysthymia     Essential hypertension 03/12/2014   Flexor tendon bowstring 01/06/2016   Gastroesophageal reflux disease 08/18/2016   Hepatic steatosis 03/02/2014   History of CVA (cerebrovascular accident) 10/12/2019   HLD (hyperlipidemia) 02/14/2014   HTN (  hypertension)     Late effect of cerebrovascular accident (CVA) 10/19/2019   Melanoma in situ of right upper extremity including shoulder (HCC) 08/07/2016    Formatting of this note might be different from the original. August 2017.  Dr. Chancy Milroy, [facility].   Murmur 05/23/2020   Myocardial infarction [facility])     Neuropathic pain, leg, bilateral  11/23/2017   Nonsustained ventricular tachycardia (HCC) 11/12/2020   Pure hypercholesterolemia 09/14/2018   Quadriceps weakness 09/01/2017   Renal cyst     Risk for falls 10/01/2016   S/P TAVR (transcatheter aortic valve replacement) 09/24/2020   Short of breath on exertion      per patient   Stroke [facility])      November   Symptomatic severe aortic stenosis with normal ejection fraction 08/15/2020             Past Surgical History:  Procedure Laterality Date   CATARACT EXTRACTION W/ INTRAOCULAR LENS  IMPLANT, BILATERAL       cataract repair       HERNIA REPAIR        unsure of date   LOOP RECORDER INSERTION N/A 08/28/2019    Procedure: LOOP RECORDER INSERTION;  Surgeon: Marinus Maw, MD;  Location: [facility] INVASIVE CV LAB;  Service: Cardiovascular;  Laterality: N/A;   PERCUTANEOUS CORONARY STENT INTERVENTION (PCI-S)       RIGHT/LEFT HEART CATH AND CORONARY ANGIOGRAPHY N/A 08/22/2020    Procedure: RIGHT/LEFT HEART CATH AND CORONARY ANGIOGRAPHY;  Surgeon: Marykay Lex, MD;  Location: [facility] INVASIVE CV LAB;  Service: Cardiovascular;  Laterality: N/A;   TEE WITHOUT CARDIOVERSION N/A 09/24/2020    Procedure: TRANSESOPHAGEAL ECHOCARDIOGRAM (TEE);  Surgeon: Tonny Bollman, MD;  Location: [facility] OR;  Service: Open Heart Surgery;  Laterality: N/A;   TRANSCATHETER AORTIC VALVE REPLACEMENT, TRANSFEMORAL N/A 09/24/2020    Procedure: TRANSCATHETER AORTIC VALVE REPLACEMENT, TRANSFEMORAL - USING EDWARDS SAPIEN 3 ULTRA  VALVE SIZE ;  Surgeon: Tonny Bollman, MD;  Location: [facility] OR;  Service: Open Heart Surgery;  Laterality: N/A;             Family History  Problem Relation Age of Onset   Diabetes Mother     Stroke Father          Social History:  reports that he has quit smoking. He has never used smokeless tobacco. He reports that he does not currently use alcohol. He reports that he does not use drugs. Allergies:  Allergies  No Known Allergies         Medications Prior to Admission   Medication Sig Dispense Refill   acetaminophen (TYLENOL) 650 MG CR tablet Take 325 mg by mouth every 8 (eight) hours as needed for pain.       albuterol (VENTOLIN HFA) 108 (90 Base) MCG/ACT inhaler Inhale 1 puff into the lungs every 6 (six) hours as needed for shortness of breath or wheezing.       atorvastatin (LIPITOR) 80 MG tablet Take 1 tablet by mouth every evening.       clopidogrel (PLAVIX) 75 MG tablet Take 1 tablet by mouth once daily 90 tablet 0   Cyanocobalamin (B-12 PO) Take 1 tablet by mouth every evening.       finasteride (PROSCAR) 5 MG tablet Take 5 mg by mouth every evening.       lisinopril (ZESTRIL) 20 MG tablet Take 1 tablet by mouth daily.       memantine (NAMENDA) 10 MG tablet Take 1 tablet (10  mg total) by mouth 2 (two) times daily. 60 tablet 3   donepezil (ARICEPT) 5 MG tablet Take 5 mg by mouth at bedtime. (Patient not taking: Reported on 06/24/2023)       venlafaxine XR (EFFEXOR-XR) 75 MG 24 hr capsule Take 75 mg by mouth daily with breakfast. (Patient not taking: Reported on 06/24/2023)                  Home: Home Living Family/patient expects to be discharged to:: Private residence Living Arrangements: Spouse/significant other Available Help at Discharge: Family, Friend(s) Type of Home: House Home Layout: One level Bathroom Shower/Tub: Sponge bathes at baseline [employer]: Standard Home Equipment: Agricultural consultant (2 wheels), Other (comment) (lift chair) Additional Comments: Wife unable to provide physical assistance   Functional History: Prior Function Prior Level of Function : Patient poor historian/Family not available, Needs assist Mobility Comments: uses RW for mobility ADLs Comments: sponge bathing for a few years per grandson, able to dress self. wife does IADLs, neither drive so friends assist with groceries   Functional Status:  Mobility: Bed Mobility Overal bed mobility: Needs Assistance Bed Mobility: Supine to Sit Supine to sit: Min  assist, Mod assist (more assist from flatter HOB to boost up via R elbow) General bed mobility comments: in chair on entry Transfers Overall transfer level: Needs assistance Equipment used: Rolling walker (2 wheels) Transfers: Sit to/from Stand Sit to Stand: Mod assist, Min assist Bed to/from chair/wheelchair/BSC transfer type:: Step pivot Step pivot transfers: Min assist General transfer comment: cues for hand placement and mod assist to come forward fromt he bed after nap Ambulation/Gait Ambulation/Gait assistance: Min assist Gait Distance (Feet): 130 Feet (total, 4 stops in standing to regroup posture and proximity to the RW.  Noted more fatigue with each bout and degradation of gait pattern, R LE increasingly lagging behind and with worsening heel/toe ability) Assistive device: Rolling walker (2 wheels) Gait Pattern/deviations: Step-through pattern, Decreased stride length, Decreased weight shift to right, Shuffle, Drifts right/left, Trunk flexed General Gait Details: Improved distance, but needed more standing rest to make adjustments in attempt to improve pattern on the R LE and correct posture and use of the RW. Gait velocity: Decreased Gait velocity interpretation: <1.31 ft/sec, indicative of household ambulator   ADL: ADL Overall ADL's : Needs assistance/impaired Eating/Feeding: Modified independent, Sitting Grooming: Minimal assistance, Standing, Oral care Grooming Details (indicate cue type and reason): shaving at sink with cues needed to use water and shaving cream. up to Min A for balance when unsupported or moving dynamically with minor posterior LOB. When asked if anything else needed to be done at this in AM, pt reports denture care with pt able to perform with increased time and prompting to problem solve the next steps Upper Body Bathing: Minimal assistance, Sitting Lower Body Bathing: Moderate assistance, Sit to/from stand Upper Body Dressing : Minimal assistance,  Sitting Lower Body Dressing: Moderate assistance, Sit to/from stand Toilet Transfer: Minimal assistance, Ambulation, Rolling walker (2 wheels) Toileting- Clothing Manipulation and Hygiene: Modified independent, Bed level (using urinal only) Functional mobility during ADLs: Minimal assistance, Rolling walker (2 wheels), Cueing for sequencing, Cueing for safety General ADL Comments: Continues to need cueing for problem solving, general awareness and safety with hands on assist needed to use typically familiar DME. Pt reports "i dont know" when asked what he wanted for breakfast and when asked what he liked in his coffee. On delivery of breakfast tray, pt reports "wheres the coffee", able to locate and  request creamer automatically   Cognition: Cognition Overall Cognitive Status: Impaired/Different from baseline Orientation Level: Oriented to person, Oriented to place, Disoriented to person, Disoriented to time Memory: Impaired Memory Impairment: Prospective memory (did not recall that he asked SLP to bring him coffee when eval was done) Problem Solving: Impaired Problem Solving Impairment: Functional basic Safety/Judgment: Impaired (pt states he wants to go home despite knowing he had a stroke and has weakness with speech and language deficits) Cognition Arousal/Alertness: Awake/alert Behavior During Therapy: WFL for tasks assessed/performed Overall Cognitive Status: Impaired/Different from baseline Area of Impairment: Orientation, Attention, Memory, Awareness, Problem solving, Safety/judgement, Following commands Orientation Level: Disoriented to, Time Current Attention Level: Sustained Memory: Decreased short-term memory Following Commands: Follows one step commands consistently, Follows one step commands with increased time Safety/Judgement: Decreased awareness of safety, Decreased awareness of deficits Awareness: Intellectual, Emergent Problem Solving: Slow processing, Difficulty  sequencing, Requires verbal cues General Comments: (from prior note: per grandson, pt with memory impairments (sometimes will forget grandson's name if he has not been by in a couple of days). pt reports "I've never had a stroke before" in relation to disbelief being in hospital. able to show some emergent awareness when asked what other tasks he may need to complete at sink the morning but still needs cues for sequencing and problem solving - pt attempting to shave with dry razor - cued to use water and shaving cream to prevent injury   Physical Exam: Blood pressure 132/64, pulse 72, temperature 98.3 F (36.8 C), temperature source Oral, resp. rate 18, height 5\' 11"  (1.803 m), weight 95 kg, SpO2 96%. Physical Exam Vitals and nursing note reviewed.  Constitutional:      Comments: Pt awake, alert, but repetitive, almost supine in bed; NAD; appears sad  HENT:     Head: Normocephalic and atraumatic.     Comments: R facial droop R tongue deviation but not as severe as Tuesday    Nose: Nose normal.     Mouth/Throat:     Mouth: Mucous membranes are dry.     Pharynx: Oropharynx is clear. No oropharyngeal exudate.  Eyes:     General:        Right eye: No discharge.        Left eye: No discharge.     Comments: R eye lagged behind L eye with EOM's- but did complete movements No nystagmus seen  Cardiovascular:     Rate and Rhythm: Normal rate and regular rhythm.     Heart sounds: Normal heart sounds. No murmur heard.    No gallop.  Pulmonary:     Effort: Pulmonary effort is normal. No respiratory distress.     Breath sounds: Normal breath sounds. No wheezing, rhonchi or rales.  Abdominal:     General: There is no distension.     Palpations: Abdomen is soft.     Tenderness: There is no abdominal tenderness.     Comments: Somewhat hypoactive BS  Musculoskeletal:     Cervical back: Neck supple. No tenderness.     Comments: RUE- biceps 4+/5; Triceps 4/5; WE 4+/5; Grip 4=/5 and FA 3+/5 LUE  5-/5 in same muscles RLE- 4+/5 in HF, KE, KF, DF and PF LLE- 5-/5 in same muscles   Skin:    General: Skin is warm and dry.     Comments: L ear bandage IV looks OK  Neurological:     Comments: Patient is alert.  Makes eye contact with examiner.  Speech is dysarthric but intelligible.  Provides name but limited medical historian.  Follows commands. Somewhat dysarthric Intact to light touch in all 4 extremities and face Naming 3/3 objects Finished 2 different sentences/phrases Questionable mild inattention- gave me L hand to shake, offered L side initially on legs Some emotional lability- near tears 2 different times  Psychiatric:     Comments: Emotional lability noted        Lab Results Last 48 Hours  No results found for this or any previous visit (from the past 48 hour(s)).   Imaging Results (Last 48 hours)  No results found.         Blood pressure 132/64, pulse 72, temperature 98.3 F (36.8 C), temperature source Oral, resp. rate 18, height 5\' 11"  (1.803 m), weight 95 kg, SpO2 96%.   Medical Problem List and Plan: 1. Functional deficits secondary to ischemic infarction left internal capsule as well as history of prior left brain infarcts             -patient may  shower             -ELOS/Goals: 10-14 days -supervision             Admit to CIR 2.  Antithrombotics: -DVT/anticoagulation:  Pharmaceutical: Lovenox             -antiplatelet therapy: Aspirin 81 mg daily and Brilinta 90 mg twice daily x 4 weeks then aspirin alone-since failed Plavix 3. Pain Management: Tylenol as needed 4. Mood/Behavior/Sleep: Namenda 10 mg twice daily             -antipsychotic agents: Provide emotional support 5. Neuropsych/cognition: This patient is not capable of making decisions on his own behalf. 6. Skin/Wound Care: Routine skin checks 7. Fluids/Electrolytes/Nutrition: Routine and helpful follow-up chemistries 8.  Hypertension.  Norvasc 5 mg daily, lisinopril 20 mg daily.  Monitor with  increased mobility 9.  Hyperlipidemia.  Lipitor 10.  BPH.  Check PVR 11.  Aortic stenosis status post TAVR March 2021 as well as history of CAD with stenting.  Follow-up outpatient with Dr. Gypsy Balsam. 12. Hx of mild dementia- on Namenda 13. Urinary urgency/frequency/incontinence- suggest timed voiding.          Mcarthur Rossetti Angiulli, PA-C 07/01/2023   I have personally performed a face to face diagnostic evaluation of this patient and formulated the key components of the plan.  Additionally, I have personally reviewed laboratory data, imaging studies, as well as relevant notes and concur with the physician assistant's documentation above.   The patient's status has not changed from the original H&P.  Any changes in documentation from the acute care chart have been noted above.

## 2023-07-01 NOTE — Progress Notes (Signed)
Occupational Therapy Treatment Patient Details Name: Michael Kidd MRN: [id number] DOB: Mar 18, 1930 Today's Date: 07/01/2023   History of present illness Pt is a 89 y/o male presenting 7/17 with slurred speech, facial droop and R sided deficits. MRI brain showed small area of acute ischemia of L corpus striatum. PMH: HTN, CVA, depression, BPH, aortic stenosis status post TAVR, CAD s/p stenting.   OT comments  Pt making incremental progress towards OT goals. Pt with improving insight into ADL needs and anticipating next steps with decreased cues today. Pt continues to require Min-Mod A to stand from surfaces and gradual improvements in RW negotiation in room today. Pt remains hopeful to regain independence and return home with wife soon.    Recommendations for follow up therapy are one component of a multi-disciplinary discharge planning process, led by the attending physician.  Recommendations may be updated based on patient status, additional functional criteria and insurance authorization.    Assistance Recommended at Discharge Frequent or constant Supervision/Assistance  Patient can return home with the following  A little help with walking and/or transfers;A lot of help with bathing/dressing/bathroom;Direct supervision/assist for medications management;Direct supervision/assist for financial management   Equipment Recommendations  BSC/3in1    Recommendations for Other Services Rehab consult    Precautions / Restrictions Precautions Precautions: Fall Restrictions Weight Bearing Restrictions: No       Mobility Bed Mobility Overal bed mobility: Needs Assistance Bed Mobility: Supine to Sit     Supine to sit: Min assist, HOB elevated     General bed mobility comments: assist to lift trunk    Transfers Overall transfer level: Needs assistance Equipment used: Rolling walker (2 wheels) Transfers: Sit to/from Stand Sit to Stand: Mod assist, Min assist           General  transfer comment: Mod A for standing from bedside with cues for hand placement on bed and RW. Min A to stand from toilet with pt pulling on grab bar and doorframe with good leverage     Balance Overall balance assessment: Needs assistance Sitting-balance support: No upper extremity supported, Feet supported Sitting balance-Leahy Scale: Good     Standing balance support: During functional activity, Reliant on assistive device for balance, Single extremity supported Standing balance-Leahy Scale: Fair                             ADL either performed or assessed with clinical judgement   ADL Overall ADL's : Needs assistance/impaired Eating/Feeding: Modified independent;Sitting   Grooming: Min guard;Supervision/safety;Standing;Oral care;Wash/dry face Grooming Details (indicate cue type and reason): min guard initially progressing to supervision with improving standing balance with tasks at sink while unsupported. pt able to sequence denture care, turning on water and placing in mouth. pt then reaching for washcloth to dry mouth then attempted to wipe face with it. pt asked "can you turn the water on?" prompted pt to turn on the faucet with pt able to return demo, wet cloth and wash face afterwards. decreasing cues needed                 Toilet Transfer: Minimal assistance;Ambulation;Rolling walker (2 wheels) Toilet Transfer Details (indicate cue type and reason): Min A to stand from low toilet with pt using grab bar and doorframe. improving direction with RW with one instance of Min A to walk around bed Toileting- Clothing Manipulation and Hygiene: Minimal assistance;Sit to/from stand;Sitting/lateral lean Toileting - Clothing Manipulation Details (indicate cue type and  reason): assist for hygiene attempts in standing per pt request (did not void)     Functional mobility during ADLs: Min guard;Minimal assistance;Rolling walker (2 wheels);Cueing for sequencing       Extremity/Trunk Assessment Upper Extremity Assessment Upper Extremity Assessment: Generalized weakness   Lower Extremity Assessment Lower Extremity Assessment: Defer to PT evaluation        Vision   Vision Assessment?: No apparent visual deficits   Perception     Praxis      Cognition Arousal/Alertness: Awake/alert Behavior During Therapy: WFL for tasks assessed/performed Overall Cognitive Status: Impaired/Different from baseline Area of Impairment: Attention, Memory, Awareness, Problem solving, Safety/judgement, Following commands                   Current Attention Level: Sustained Memory: Decreased short-term memory Following Commands: Follows one step commands consistently, Follows one step commands with increased time Safety/Judgement: Decreased awareness of safety, Decreased awareness of deficits Awareness: Intellectual, Emergent Problem Solving: Slow processing, Difficulty sequencing, Requires verbal cues General Comments: (from prior note: per grandson, pt with memory impairments (sometimes will forget grandson's name if he has not been by in a couple of days). showing some emergent awareness in regards to needed ADLs to complete at sink in prep for other tasks (cleaning dentures before placing for breakfast). Still with some AMS as pt again unable to report if he likes cream/sugar in coffee but when coffee placed in view, pt able to indicate desire for cream/sugar        Exercises      Shoulder Instructions       General Comments      Pertinent Vitals/ Pain       Pain Assessment Pain Assessment: Faces Faces Pain Scale: Hurts a little bit Pain Location: BLE Pain Descriptors / Indicators: Sore Pain Intervention(s): Monitored during session  Home Living                                          Prior Functioning/Environment              Frequency  Min 1X/week        Progress Toward Goals  OT Goals(current goals can  now be found in the care plan section)  Progress towards OT goals: Progressing toward goals  Acute Rehab OT Goals Patient Stated Goal: go home OT Goal Formulation: With patient/family Time For Goal Achievement: 07/08/23 Potential to Achieve Goals: Good ADL Goals Pt Will Perform Lower Body Bathing: with supervision;sit to/from stand Pt Will Perform Lower Body Dressing: with supervision;sit to/from stand Pt Will Transfer to Toilet: with supervision;ambulating Additional ADL Goal #1: Pt to complete 3 step trail making task with no more than min verbal cues  Plan Discharge plan remains appropriate    Co-evaluation                 AM-PAC OT "6 Clicks" Daily Activity     Outcome Measure   Help from another person eating meals?: None Help from another person taking care of personal grooming?: A Little Help from another person toileting, which includes using toliet, bedpan, or urinal?: A Little Help from another person bathing (including washing, rinsing, drying)?: A Lot Help from another person to put on and taking off regular upper body clothing?: A Little Help from another person to put on and taking off regular lower body clothing?: A Lot 6  Click Score: 17    End of Session Equipment Utilized During Treatment: Gait belt;Rolling walker (2 wheels)  OT Visit Diagnosis: Other abnormalities of gait and mobility (R26.89);Muscle weakness (generalized) (M62.81);Other symptoms and signs involving cognitive function   Activity Tolerance Patient tolerated treatment well   Patient Left in chair;with call bell/phone within reach;with chair alarm set   Nurse Communication          Time: [phone] OT Time Calculation (min): 26 min  Charges: OT General Charges $OT Visit: 1 Visit OT Treatments $Self Care/Home Management : 23-37 mins  Bradd Canary, OTR/L Acute Rehab Services Office: [phone]   Lorre Munroe 07/01/2023, 10:42 AM

## 2023-07-01 NOTE — Progress Notes (Signed)
Inpatient Rehab Admissions Coordinator:    I have insurance approval and a bed available for pt to admit to CIR today. Dr. Jerral Ralph in agreement.  Will let pt/family and TOC team know.   Estill Dooms, PT, DPT Admissions Coordinator [phone] 07/01/23  9:45 AM

## 2023-07-01 NOTE — Plan of Care (Signed)
  Problem: Education: Goal: Knowledge of disease or condition will improve Outcome: Progressing   

## 2023-07-01 NOTE — Progress Notes (Signed)
Met with patient. Brought up to unit via w/c. Introduced self. Says that he isn't doing well. Spouse not at bedside. Started asking questions but he was unsure.  Updated board. Will see if wife comes in later. All needs met, bed alarm on and activated.

## 2023-07-02 DIAGNOSIS — R194 Change in bowel habit: Secondary | ICD-10-CM

## 2023-07-02 DIAGNOSIS — I6930 Unspecified sequelae of cerebral infarction: Secondary | ICD-10-CM | POA: Diagnosis not present

## 2023-07-02 DIAGNOSIS — R451 Restlessness and agitation: Secondary | ICD-10-CM

## 2023-07-02 LAB — CBC WITH DIFFERENTIAL/PLATELET: Eosinophils Absolute: 0.2 10*3/uL (ref 0.0–0.5)

## 2023-07-02 MED ORDER — TRAZODONE HCL 50 MG PO TABS
50.0000 mg | ORAL_TABLET | Freq: Every evening | ORAL | Status: DC | PRN
  Administered 2023-07-02 – 2023-07-14 (×11): 50 mg via ORAL
  Filled 2023-07-02 (×11): qty 1

## 2023-07-02 MED ORDER — SENNOSIDES-DOCUSATE SODIUM 8.6-50 MG PO TABS
1.0000 | ORAL_TABLET | Freq: Two times a day (BID) | ORAL | Status: DC
  Administered 2023-07-02 – 2023-07-20 (×36): 1 via ORAL
  Filled 2023-07-02 (×38): qty 1

## 2023-07-02 MED ORDER — FLUOXETINE HCL 10 MG PO CAPS
10.0000 mg | ORAL_CAPSULE | Freq: Every day | ORAL | Status: DC
  Administered 2023-07-02 – 2023-07-15 (×14): 10 mg via ORAL
  Filled 2023-07-02 (×14): qty 1

## 2023-07-02 NOTE — Progress Notes (Signed)
Physical Therapy Assessment and Plan  Patient Details  Name: Michael Kidd MRN: [id number] Date of Birth: Oct 16, 1930  PT Diagnosis: Abnormal posture, Abnormality of gait, Cognitive deficits, Difficulty walking, Hemiplegia dominant, and Impaired cognition Rehab Potential: Good ELOS: 2-2.5 weeks   Today's Date: 07/02/2023 PT Individual Time: 5638-7564 PT Individual Time Calculation (min): 70 min    Hospital Problem: Principal Problem:   Ischemic cerebrovascular accident (CVA) [facility])   Past Medical History:  Past Medical History:  Diagnosis Date   Allergic rhinitis 03/29/2014   Aortic valve stenosis 05/23/2020   Arthralgia 07/15/2016   Arthritis    Benign prostatic hyperplasia 02/14/2014   Benign prostatic hyperplasia with urinary frequency 11/02/2018   Bilateral chronic knee pain    BPH (benign prostatic hyperplasia)    CAD (coronary artery disease)    s/p remote LAD PCI   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 PHQ-9 completed 02/26/14 PHQ-2 completed 02/14/14   Dizziness 03/12/2014   Dupuytren's contracture 07/15/2016   Dyspnea on exertion 08/15/2020   Dysthymia    Essential hypertension 03/12/2014   Flexor tendon bowstring 01/06/2016   Gastroesophageal reflux disease 08/18/2016   Hepatic steatosis 03/02/2014   History of CVA (cerebrovascular accident) 10/12/2019   HLD (hyperlipidemia) 02/14/2014   HTN (hypertension)    Late effect of cerebrovascular accident (CVA) 10/19/2019   Melanoma in situ of right upper extremity including shoulder (HCC) 08/07/2016   Formatting of this note might be different from the original. August 2017.  Dr. Chancy Milroy, [facility].   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   Short of breath on exertion    per patient   Stroke [facility])    November   Symptomatic severe aortic stenosis with normal ejection fraction 08/15/2020   Past Surgical History:  Past Surgical History:  Procedure Laterality Date   CATARACT EXTRACTION W/ INTRAOCULAR LENS  IMPLANT, BILATERAL     cataract repair     HERNIA REPAIR     unsure of date   LOOP RECORDER INSERTION N/A 08/28/2019   Procedure: LOOP RECORDER INSERTION;  Surgeon: Marinus Maw, MD;  Location: [facility] INVASIVE CV LAB;  Service: Cardiovascular;  Laterality: N/A;   PERCUTANEOUS CORONARY STENT INTERVENTION (PCI-S)     RIGHT/LEFT HEART CATH AND CORONARY ANGIOGRAPHY N/A 08/22/2020   Procedure: RIGHT/LEFT HEART CATH AND CORONARY ANGIOGRAPHY;  Surgeon: Marykay Lex, MD;  Location: [facility] INVASIVE CV LAB;  Service: Cardiovascular;  Laterality: N/A;   TEE WITHOUT CARDIOVERSION N/A 09/24/2020   Procedure: TRANSESOPHAGEAL ECHOCARDIOGRAM (TEE);  Surgeon: Tonny Bollman, MD;  Location: [facility] OR;  Service: Open Heart Surgery;  Laterality: N/A;   TRANSCATHETER AORTIC VALVE REPLACEMENT, TRANSFEMORAL N/A 09/24/2020   Procedure: TRANSCATHETER AORTIC VALVE REPLACEMENT, TRANSFEMORAL - USING EDWARDS SAPIEN 3 ULTRA  VALVE SIZE ;  Surgeon: Tonny Bollman, MD;  Location: [facility] OR;  Service: Open Heart Surgery;  Laterality: N/A;    Assessment & Plan Clinical Impression: Patient is a 89 year old right-handed male with history of hypertension, BPH, aortic stenosis status post TAVR 2021,  CAD with stenting, hyperlipidemia, tobacco use, history of CVA January 2024 maintained on Plavix with workup completed at [facility] also has mild dementia on Namenda; depression. Per chart review patient lives with spouse. 1 level at home. Wife assist with  ADLs. Presented 06/23/2023 with right side side weakness and aphasia. CT/MRI showed small area of acute ischemia of the left corpus stratum. No hemorrhage or mass effect. Old right parietal and occipital infarcts as well as old small vessel infarction of the basal ganglia, cerebellum and corona radiata. CTA showed no large vessel occlusion or hemodynamically significant stenosis. Admission chemistries unremarkable except potassium 3.4, glucose 136, hemoglobin A1c 5.7. Echocardiogram with ejection fraction of 60 to 65% no wall motion abnormalities grade 1 diastolic dysfunction. Neurology follow-up currently maintained on low-dose aspirin with Brilinta 90 mg twice daily. Lovenox added for DVT prophylaxis. Tolerating a regular consistency diet. Therapy evaluations completed due to patient decreased functional mobility and right-sided weakness was admitted for a comprehensive rehab program.   Patient currently requires mod with mobility secondary to muscle weakness, decreased cardiorespiratoy endurance, decreased coordination, decreased midline orientation, decreased attention to right, and decreased motor planning, decreased initiation, decreased awareness, decreased problem solving, decreased safety awareness, decreased memory, and delayed processing, and decreased sitting balance, decreased standing balance, hemiplegia, and decreased balance strategies.  Prior to hospitalization, patient was independent  with mobility and lived with Spouse in a House home.  Home access is 3Stairs to enter.  Patient will benefit from skilled PT intervention to maximize safe functional mobility, minimize fall risk, and decrease caregiver burden for planned discharge home with 24 hour supervision.  Anticipate patient will benefit from follow up HH at discharge.  PT - End of Session Activity Tolerance: Tolerates 30+ min activity with multiple rests Endurance Deficit: Yes Endurance Deficit Description: Global deconditioning PT  Assessment Rehab Potential (ACUTE/IP ONLY): Good PT Barriers to Discharge: Decreased caregiver support;Home environment access/layout;Insurance for SNF coverage;Lack of/limited family support;Behavior PT Patient demonstrates impairments in the following area(s): Balance;Behavior;Perception;Safety;Endurance;Motor PT Transfers Functional Problem(s): Bed Mobility;Bed to Chair;Car;Furniture PT Locomotion Functional Problem(s): Ambulation;Wheelchair Mobility;Stairs PT Plan PT Intensity: Minimum of 1-2 x/day ,45 to 90 minutes PT Frequency: 5 out of 7 days PT Duration Estimated Length of Stay: 2-2.5 weeks PT Treatment/Interventions: Ambulation/gait training;Cognitive remediation/compensation;Discharge planning;DME/adaptive equipment instruction;Functional mobility training;Pain management;Splinting/orthotics;Psychosocial support;Therapeutic Activities;UE/LE Strength taining/ROM;Visual/perceptual remediation/compensation;Balance/vestibular training;Community reintegration;Disease management/prevention;Functional electrical stimulation;Neuromuscular re-education;Patient/family education;Stair training;Therapeutic Exercise;UE/LE Coordination activities;Wheelchair propulsion/positioning PT Transfers Anticipated Outcome(s): Supv with LRAD PT Locomotion Anticipated Outcome(s): Supv with LRAD PT Recommendation Recommendations for Other Services: Neuropsych consult;Therapeutic Recreation consult Therapeutic Recreation Interventions: Pet therapy;Stress management Follow Up Recommendations: Home health PT Patient destination: Home Equipment Recommended: To be determined   PT Evaluation Precautions/Restrictions Precautions Precautions: Fall Restrictions Weight Bearing Restrictions: No Pain Pain Assessment Pain Scale: 0-10 Pain Score: 0-No pain Pain Interference Pain Interference Pain Effect on Sleep: 1. Rarely or not at all Pain Interference with Therapy Activities: 1. Rarely or not at all Pain  Interference with Day-to-Day Activities: 1. Rarely or not at all Home Living/Prior Functioning Home Living Living Arrangements: Spouse/significant other Available Help at Discharge: Family Type of Home: House Home Access: Stairs to enter [employer] of Steps: 3 Entrance Stairs-Rails: Right;Left Home Layout: One level Bathroom Shower/Tub: Sponge bathes at baseline Bathroom Toilet: Standard Bathroom Accessibility: Yes Additional Comments: All the above information needs to be verified with patient's wife as patient is a poor historian  Lives With: Spouse Prior Function Level of Independence: Independent with transfers;Independent with gait  Able to Take  Stairs?: Yes Driving: No Vocation: Retired Leisure: Hobbies-yes (Comment) (Enjoys spending time with his wife, Talbert Forest.) Vision/Perception  Vision - History Ability to See in Adequate Light: 0 Adequate Perception Perception: Within Functional Limits Praxis Praxis: Impaired Praxis Impairment Details: Perseveration Praxis-Other Comments: Patient verbally perseverates on his wife throughout treatment session, as well as not wanting to live anymore  Cognition Overall Cognitive Status: No family/caregiver present to determine baseline cognitive functioning Arousal/Alertness: Awake/alert Orientation Level: Oriented to person;Oriented to place;Disoriented to situation;Disoriented to time Year:  (States "I don't know") Month:  (States "I don't know") Day of Week: Incorrect (States "I don't know") Memory: Impaired Awareness: Impaired Problem Solving: Impaired Problem Solving Impairment: Functional basic Safety/Judgment: Impaired Sensation Sensation Light Touch: Appears Intact Coordination Gross Motor Movements are Fluid and Coordinated: No Fine Motor Movements are Fluid and Coordinated: No Coordination and Movement Description: R hemiplegia, decreased smoothness and accracy with R hand and R LE Motor  Motor Motor:  Hemiplegia Motor - Skilled Clinical Observations: R hemiplegia   Trunk/Postural Assessment  Cervical Assessment Cervical Assessment: Exceptions to [facility] (Forward head) Thoracic Assessment Thoracic Assessment: Exceptions to [facility] (Rounded shoulders) Lumbar Assessment Lumbar Assessment: Exceptions to [facility] (Posterior pelvic tilt) Postural Control Postural Control: Deficits on evaluation Righting Reactions: Delayed and inadequate  Balance Balance Balance Assessed: Yes Static Sitting Balance Static Sitting - Balance Support: Feet supported Static Sitting - Level of Assistance: 5: Stand by assistance Dynamic Sitting Balance Dynamic Sitting - Balance Support: Feet supported;During functional activity Dynamic Sitting - Level of Assistance: 5: Stand by assistance;4: Min assist Static Standing Balance Static Standing - Balance Support: Bilateral upper extremity supported Static Standing - Level of Assistance: 4: Min assist;5: Stand by assistance (CGA) Dynamic Standing Balance Dynamic Standing - Balance Support: During functional activity;Bilateral upper extremity supported Dynamic Standing - Level of Assistance: 4: Min assist Extremity Assessment  RUE Assessment RUE Assessment: Exceptions to [facility] Active Range of Motion (AROM) Comments: shoulder FF limited to ~70 degrees, 3/5 strength grossly LUE Assessment LUE Assessment: Exceptions to [facility] General Strength Comments: Shoulder FF limited to ~ 90 degrees at baseline RLE Assessment RLE Assessment: Exceptions to [facility] General Strength Comments: Grossly 3+/5- Difficult to perform formal MMT secondary to impaired cognition LLE Assessment LLE Assessment: Within Functional Limits General Strength Comments: Grossly 4/5  Care Tool Care Tool Bed Mobility Roll left and right activity   Roll left and right assist level: Minimal Assistance - Patient > 75%    Sit to lying activity   Sit to lying assist level: Minimal Assistance - Patient > 75%     Lying to sitting on side of bed activity   Lying to sitting on side of bed assist level: the ability to move from lying on the back to sitting on the side of the bed with no back support.: Minimal Assistance - Patient > 75%     Care Tool Transfers Sit to stand transfer   Sit to stand assist level: Moderate Assistance - Patient 50 - 74%    Chair/bed transfer   Chair/bed transfer assist level: Moderate Assistance - Patient 50 - 74%     Toilet transfer   Assist Level: Moderate Assistance - Patient 50 - 74%    Car transfer          Care Tool Locomotion Ambulation   Assist level: Minimal Assistance - Patient > 75% Assistive device: Walker-rolling Max distance: 180'  Walk 10 feet activity   Assist level: Minimal Assistance - Patient > 75% Assistive device: Walker-rolling  Walk 50 feet with 2 turns activity   Assist level: Minimal Assistance - Patient > 75% Assistive device: Walker-rolling  Walk 150 feet activity   Assist level: Minimal Assistance - Patient > 75% Assistive device: Walker-rolling  Walk 10 feet on uneven surfaces activity   Assist level: Minimal Assistance - Patient > 75% Assistive device: Walker-rolling  Stairs   Assist level: Minimal Assistance - Patient > 75% Stairs assistive device: 2 hand rails Max number of stairs: 4  Walk up/down 1 step activity   Walk up/down 1 step (curb) assist level: Minimal Assistance - Patient > 75% Walk up/down 1 step or curb assistive device: 2 hand rails  Walk up/down 4 steps activity   Walk up/down 4 steps assist level: Minimal Assistance - Patient > 75% Walk up/down 4 steps assistive device: 2 hand rails  Walk up/down 12 steps activity   Walk up/down 12 steps assist level: Minimal Assistance - Patient > 75% Walk up/down 12 steps assistive device: 2 hand rails  Pick up small objects from floor   Pick up small object from the floor assist level: Minimal Assistance - Patient > 75% Pick up small object from the floor  assistive device: RW  Wheelchair Is the patient using a wheelchair?: Yes Type of Wheelchair: Manual   Wheelchair assist level: Dependent - Patient 0% Max wheelchair distance: 150'  Wheel 50 feet with 2 turns activity   Assist Level: Dependent - Patient 0%  Wheel 150 feet activity   Assist Level: Dependent - Patient 0%    Refer to Care Plan for Long Term Goals  SHORT TERM GOAL WEEK 1 PT Short Term Goal 1 (Week 1): Patient will complete bed/chair transfer with LRAD and MinA PT Short Term Goal 2 (Week 1): Patient will ambulate >71' with LRAD and CGA PT Short Term Goal 3 (Week 1): Patient will ascend/descend x4 steps with BHR and CGA  Recommendations for other services: Neuropsych and Therapeutic Recreation  Pet therapy and Stress management  Skilled Therapeutic Intervention Mobility Bed Mobility Bed Mobility: Rolling Right;Rolling Left;Supine to Sit;Sit to Supine Rolling Right: Minimal Assistance - Patient > 75% Rolling Left: Minimal Assistance - Patient > 75% Supine to Sit: Minimal Assistance - Patient > 75% Sit to Supine: Minimal Assistance - Patient > 75% Transfers Transfers: Sit to Stand;Stand to Sit;Stand Pivot Transfers Sit to Stand: Moderate Assistance - Patient 50-74% Stand to Sit: Moderate Assistance - Patient 50-74% Stand Pivot Transfers: Moderate Assistance - Patient 50 - 74% Stand Pivot Transfer Details: Verbal cues for sequencing;Verbal cues for precautions/safety;Verbal cues for technique;Verbal cues for safe use of DME/AE Transfer (Assistive device): Rolling walker Locomotion  Gait Ambulation: Yes Gait Assistance: Minimal Assistance - Patient > 75% Gait Distance (Feet): 180 Feet Assistive device: Rolling walker Gait Assistance Details: Verbal cues for technique;Verbal cues for sequencing;Verbal cues for precautions/safety;Verbal cues for safe use of DME/AE;Verbal cues for gait pattern Gait Gait: Yes Gait Pattern: Impaired (Impaired RW management, decreased R  foot clearance and step length) Gait velocity: Decreased Stairs / Additional Locomotion Stairs: Yes Stairs Assistance: Minimal Assistance - Patient > 75% Stair Management Technique: Two rails;Step to pattern Number of Stairs: 4 Height of Stairs: 6 Ramp: Minimal Assistance - Patient >75% Curb: Minimal Assistance - Patient >75% Wheelchair Mobility Wheelchair Mobility: Yes Wheelchair Assistance: Dependent - Patient 0% Wheelchair Parts Management: Needs assistance Distance: 150'  Skilled Intervention- Patient greeted sitting EOB in his room and agreeable to PT treatment session. Patient requesting to call his wife prior to leaving  the room so therapist helped him dial her number and re-oriented him when he was unsure who he was talking with. Therapist made a sign for the room to assist patient with calling his wife and providing orientation- RN and NT notified and aware. Evaluation completed (see details above and below) with education on PT POC and goals and individual treatment initiated with focus on bed mobility, sit/stands, transfers, gait training, stair mobility, NMR, discharge planning, education and re-orientation throughout. Patient left supine in bed with bed alarm on, call bell within reach and all needs met.    Discharge Criteria: Patient will be discharged from PT if patient refuses treatment 3 consecutive times without medical reason, if treatment goals not met, if there is a change in medical status, if patient makes no progress towards goals or if patient is discharged from hospital.  The above assessment, treatment plan, treatment alternatives and goals were discussed and mutually agreed upon: by patient  [place]  Abid Bolla 07/02/2023, 3:11 PM

## 2023-07-02 NOTE — Plan of Care (Signed)
  Problem: RH Balance Goal: LTG Patient will maintain dynamic standing with ADLs (OT) Description: LTG:  Patient will maintain dynamic standing balance with assist during activities of daily living (OT)  Flowsheets (Taken 07/02/2023 1308) LTG: Pt will maintain dynamic standing balance during ADLs with: Supervision/Verbal cueing   Problem: Sit to Stand Goal: LTG:  Patient will perform sit to stand in prep for activites of daily living with assistance level (OT) Description: LTG:  Patient will perform sit to stand in prep for activites of daily living with assistance level (OT) Flowsheets (Taken 07/02/2023 1308) LTG: PT will perform sit to stand in prep for activites of daily living with assistance level: Supervision/Verbal cueing   Problem: RH Eating Goal: LTG Patient will perform eating w/assist, cues/equip (OT) Description: LTG: Patient will perform eating with assist, with/without cues using equipment (OT) Flowsheets (Taken 07/02/2023 1308) LTG: Pt will perform eating with assistance level of: Independent with assistive device    Problem: RH Grooming Goal: LTG Patient will perform grooming w/assist,cues/equip (OT) Description: LTG: Patient will perform grooming with assist, with/without cues using equipment (OT) Flowsheets (Taken 07/02/2023 1308) LTG: Pt will perform grooming with assistance level of: Independent with assistive device    Problem: RH Bathing Goal: LTG Patient will bathe all body parts with assist levels (OT) Description: LTG: Patient will bathe all body parts with assist levels (OT) Flowsheets (Taken 07/02/2023 1308) LTG: Pt will perform bathing with assistance level/cueing: Supervision/Verbal cueing   Problem: RH Dressing Goal: LTG Patient will perform upper body dressing (OT) Description: LTG Patient will perform upper body dressing with assist, with/without cues (OT). Flowsheets (Taken 07/02/2023 1308) LTG: Pt will perform upper body dressing with assistance level of:  Supervision/Verbal cueing Goal: LTG Patient will perform lower body dressing w/assist (OT) Description: LTG: Patient will perform lower body dressing with assist, with/without cues in positioning using equipment (OT) Flowsheets (Taken 07/02/2023 1308) LTG: Pt will perform lower body dressing with assistance level of: Supervision/Verbal cueing   Problem: RH Toileting Goal: LTG Patient will perform toileting task (3/3 steps) with assistance level (OT) Description: LTG: Patient will perform toileting task (3/3 steps) with assistance level (OT)  Flowsheets (Taken 07/02/2023 1308) LTG: Pt will perform toileting task (3/3 steps) with assistance level: Supervision/Verbal cueing   Problem: RH Functional Use of Upper Extremity Goal: LTG Patient will use RT/LT upper extremity as a (OT) Description: LTG: Patient will use right/left upper extremity as a stabilizer/gross assist/diminished/nondominant/dominant level with assist, with/without cues during functional activity (OT) Flowsheets (Taken 07/02/2023 1308) LTG: Use of upper extremity in functional activities: RUE as nondominant level   Problem: RH Toilet Transfers Goal: LTG Patient will perform toilet transfers w/assist (OT) Description: LTG: Patient will perform toilet transfers with assist, with/without cues using equipment (OT) Flowsheets (Taken 07/02/2023 1308) LTG: Pt will perform toilet transfers with assistance level of: Supervision/Verbal cueing   Problem: RH Tub/Shower Transfers Goal: LTG Patient will perform tub/shower transfers w/assist (OT) Description: LTG: Patient will perform tub/shower transfers with assist, with/without cues using equipment (OT) Flowsheets (Taken 07/02/2023 1308) LTG: Pt will perform tub/shower stall transfers with assistance level of: Supervision/Verbal cueing   Problem: RH Awareness Goal: LTG: Patient will demonstrate awareness during functional activites type of (OT) Description: LTG: Patient will demonstrate  awareness during functional activites type of (OT) Flowsheets (Taken 07/02/2023 1308) LTG: Patient will demonstrate awareness during functional activites type of (OT): Supervision

## 2023-07-02 NOTE — Evaluation (Signed)
Speech Language Pathology Assessment and Plan  Patient Details  Name: Michael Kidd MRN: [id number] Date of Birth: March 19, 1930  SLP Diagnosis: Dysarthria;Aphasia  Rehab Potential: Fair ELOS: 10-14 days    Today's Date: 07/02/2023 SLP Individual Time: 3244-0102 SLP Individual Time Calculation (min): 45 min and Today's Date: 07/02/2023 SLP Missed Time: 15 Minutes Missed Time Reason: Other (Comment) (assessment completed)   Hospital Problem: Principal Problem:   Ischemic cerebrovascular accident (CVA) [facility])  Past Medical History:  Past Medical History:  Diagnosis Date   Allergic rhinitis 03/29/2014   Aortic valve stenosis 05/23/2020   Arthralgia 07/15/2016   Arthritis    Benign prostatic hyperplasia 02/14/2014   Benign prostatic hyperplasia with urinary frequency 11/02/2018   Bilateral chronic knee pain    BPH (benign prostatic hyperplasia)    CAD (coronary artery disease)    s/p remote LAD PCI   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 PHQ-9 completed 02/26/14 PHQ-2 completed 02/14/14   Dizziness 03/12/2014   Dupuytren's contracture 07/15/2016   Dyspnea on exertion 08/15/2020   Dysthymia    Essential hypertension 03/12/2014   Flexor tendon bowstring 01/06/2016   Gastroesophageal reflux disease 08/18/2016   Hepatic steatosis 03/02/2014   History of CVA (cerebrovascular accident) 10/12/2019   HLD (hyperlipidemia) 02/14/2014   HTN (hypertension)    Late effect of cerebrovascular accident (CVA) 10/19/2019   Melanoma in situ of right upper extremity including shoulder (HCC) 08/07/2016   Formatting of this note might be different from the original. August 2017.  Dr. Chancy Milroy, [facility].   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   Short of breath on exertion    per patient   Stroke  [facility])    November   Symptomatic severe aortic stenosis with normal ejection fraction 08/15/2020   Past Surgical History:  Past Surgical History:  Procedure Laterality Date   CATARACT EXTRACTION W/ INTRAOCULAR LENS  IMPLANT, BILATERAL     cataract repair     HERNIA REPAIR     unsure of date   LOOP RECORDER INSERTION N/A 08/28/2019   Procedure: LOOP RECORDER INSERTION;  Surgeon: Marinus Maw, MD;  Location: [facility] INVASIVE CV LAB;  Service: Cardiovascular;  Laterality: N/A;   PERCUTANEOUS CORONARY STENT INTERVENTION (PCI-S)     RIGHT/LEFT HEART CATH AND CORONARY ANGIOGRAPHY N/A 08/22/2020   Procedure: RIGHT/LEFT HEART CATH AND CORONARY ANGIOGRAPHY;  Surgeon: Marykay Lex, MD;  Location: [facility] INVASIVE CV LAB;  Service: Cardiovascular;  Laterality: N/A;   TEE WITHOUT CARDIOVERSION N/A 09/24/2020   Procedure: TRANSESOPHAGEAL ECHOCARDIOGRAM (TEE);  Surgeon: Tonny Bollman, MD;  Location: [facility] OR;  Service: Open Heart Surgery;  Laterality: N/A;   TRANSCATHETER AORTIC VALVE REPLACEMENT, TRANSFEMORAL N/A 09/24/2020   Procedure: TRANSCATHETER AORTIC VALVE REPLACEMENT, TRANSFEMORAL - USING EDWARDS SAPIEN 3 ULTRA  VALVE SIZE ;  Surgeon: Tonny Bollman, MD;  Location: [facility] OR;  Service: Open Heart Surgery;  Laterality: N/A;    Assessment / Plan / Recommendation Clinical Impression  Michael Kidd is a 89 year old right-handed male with history  of hypertension, BPH, aortic stenosis status post TAVR 2021, CAD with stenting, hyperlipidemia, tobacco use, history of CVA January 2024 maintained on Plavix with workup completed at [facility] also has mild dementia on Namenda; depression. Per chart review patient lives  with spouse. 1 level at home. Wife assist with ADLs. Presented 06/23/2023 with right side side weakness and aphasia. CT/MRI showed small area of acute ischemia of the left corpus stratum. No hemorrhage or mass effect. Old right parietal and occipital infarcts as well as old small vessel infarction of the basal ganglia, cerebellum and corona radiata. CTA showed no large vessel occlusion or hemodynamically significant stenosis. Admission chemistries unremarkable except potassium 3.4, glucose 136, hemoglobin A1c 5.7. Echocardiogram with ejection fraction of 60 to 65% no wall motion abnormalities grade 1 diastolic dysfunction. Neurology follow-up currently maintained on low-dose aspirin with Brilinta 90 mg twice daily. Lovenox added for DVT prophylaxis. Tolerating a regular consistency diet. Therapy evaluations completed due to patient decreased functional mobility and right-sided weakness was admitted for a comprehensive rehab program.    Skilled Therapeutic Interventions          Pt seen for informal speech and language assessment. No family present at bedside to provide baseline information. Per EMR, pt has cognitive deficits at baseline and his wife assists with ADLs.   Pt presents with a mild mixed expressive/receptive aphasia and mild dysarthria per assessment completed today. Running speech observed to be ~60-70% intelligible to unfamiliar listener. Dysarthria characterized by reduced vocal intensity and reduced articulatory precision. Expressive language deficits characterized by reduced word-fining in conversation, especially with open-ended questions. Of note, memory impairments may also be contributing to pt's anomia. Pt able to answer object naming/function questions with 100% accuracy. Repetition reduced at the phrase level. Receptive language deficits included reduced auditory comprehension of multi-step commands. Question if pt is hard of hearing. He denied hearing impairment at this time and does not  use hearing aids.   Recommend skilled SLP services to address speech, language, and orientation goals. Will adjust goals to include functional cognitive skills if pt's family feels that he is not at his cognitive baseline.   Pt left in bed with bed alarm activated and call bell/needs in reach. Continue SLP PoC.    SLP Assessment  Patient will need skilled Speech Lanaguage Pathology Services during CIR admission    Recommendations  Oral Care Recommendations: Oral care BID Patient destination: Home Follow up Recommendations: 24 hour supervision/assistance;Home Health SLP Equipment Recommended: None recommended by SLP    SLP Frequency 1 to 3 out of 7 days   SLP Duration  SLP Intensity  SLP Treatment/Interventions 10-14 days  Minumum of 1-2 x/day, 30 to 90 minutes  Cueing hierarchy;Functional tasks;Patient/family education;Therapeutic Activities;Speech/Language facilitation    Pain Pain Assessment Pain Scale: 0-10 Pain Score: 0-No pain  Prior Functioning Cognitive/Linguistic Baseline: Baseline deficits Baseline deficit details: Per acute SLP evaluation" Per prior evaluation 08/2019 pt with decreased memory, exec function, problem solving, attention- which was baseline at that time per his wife report to evaluating SLP". No family at bedside to provide updated information on this date. Type of Home: House  Lives With: Spouse Available Help at Discharge: Family  SLP Evaluation Cognition Overall Cognitive Status: No family/caregiver present to determine baseline cognitive functioning Arousal/Alertness: Awake/alert Orientation Level: Oriented to person;Oriented to place;Disoriented to situation;Disoriented to time Year:  (did not know) Month: December (incorrect)  Comprehension Auditory Comprehension Overall Auditory Comprehension: Impaired Yes/No Questions: Impaired Basic Biographical Questions: 76-100% accurate Basic Immediate Environment  Questions: 75-100%  accurate Complex Questions: 75-100% accurate Commands: Impaired One Step Basic Commands: 75-100% accurate Two Step Basic Commands: 0-24% accurate Conversation: Simple EffectiveTechniques: Extra processing time;Repetition;Visual/Gestural cues Visual Recognition/Discrimination Discrimination: Not tested Reading Comprehension Reading Status: Not tested Expression Expression Primary Mode of Expression: Verbal Verbal Expression Overall Verbal Expression: Impaired Initiation: No impairment Level of Generative/Spontaneous Verbalization: Phrase Repetition: Impaired Level of Impairment: Sentence level Naming: No impairment Pragmatics: No impairment Non-Verbal Means of Communication: Not applicable Written Expression Dominant Hand: Right Written Expression: Not tested Oral Motor    Care Tool Care Tool Cognition Ability to hear (with hearing aid or hearing appliances if normally used Ability to hear (with hearing aid or hearing appliances if normally used): 1. Minimal difficulty - difficulty in some environments (e.g. when person speaks softly or setting is noisy)   Expression of Ideas and Wants Expression of Ideas and Wants: 3. Some difficulty - exhibits some difficulty with expressing needs and ideas (e.g, some words or finishing thoughts) or speech is not clear   Understanding Verbal and Non-Verbal Content Understanding Verbal and Non-Verbal Content: 2. Sometimes understands - understands only basic conversations or simple, direct phrases. Frequently requires cues to understand  Memory/Recall Ability     Swallow screen Pt consumed consecutive straw sips of thin liquids totaling 4-5 ounces with no overt or subtle s/s of aspiration observed. Pt denied hx of dysphagia or acute onset of dysphagia at this time.     Short Term Goals: Week 1: SLP Short Term Goal 1 (Week 1): Pt will utilize compensatory word finding strategies to repair communication breakdowns with >75% accuracy given  min cues SLP Short Term Goal 2 (Week 1): Pt will follow 2-3 step auditory commands with >75% accuracy given min cues. SLP Short Term Goal 3 (Week 1): Pt will utilize external aids to answer orientation questions with >80% accuracy  Refer to Care Plan for Long Term Goals  Recommendations for other services: None   Discharge Criteria: Patient will be discharged from SLP if patient refuses treatment 3 consecutive times without medical reason, if treatment goals not met, if there is a change in medical status, if patient makes no progress towards goals or if patient is discharged from hospital.  The above assessment, treatment plan, treatment alternatives and goals were discussed and mutually agreed upon: by patient  Ellery Plunk 07/02/2023, 12:08 PM

## 2023-07-02 NOTE — Plan of Care (Signed)
Problem: RH Balance Goal: LTG Patient will maintain dynamic standing balance (PT) Description: LTG:  Patient will maintain dynamic standing balance with assistance during mobility activities (PT) Flowsheets (Taken 07/02/2023 1553) LTG: Pt will maintain dynamic standing balance during mobility activities with:: Supervision/Verbal cueing   Problem: Sit to Stand Goal: LTG:  Patient will perform sit to stand with assistance level (PT) Description: LTG:  Patient will perform sit to stand with assistance level (PT) Flowsheets (Taken 07/02/2023 1553) LTG: PT will perform sit to stand in preparation for functional mobility with assistance level: Supervision/Verbal cueing   Problem: RH Bed Mobility Goal: LTG Patient will perform bed mobility with assist (PT) Description: LTG: Patient will perform bed mobility with assistance, with/without cues (PT). Flowsheets (Taken 07/02/2023 1553) LTG: Pt will perform bed mobility with assistance level of: Independent with assistive device    Problem: RH Bed to Chair Transfers Goal: LTG Patient will perform bed/chair transfers w/assist (PT) Description: LTG: Patient will perform bed to chair transfers with assistance (PT). Flowsheets (Taken 07/02/2023 1553) LTG: Pt will perform Bed to Chair Transfers with assistance level: Supervision/Verbal cueing   Problem: RH Car Transfers Goal: LTG Patient will perform car transfers with assist (PT) Description: LTG: Patient will perform car transfers with assistance (PT). Flowsheets (Taken 07/02/2023 1553) LTG: Pt will perform car transfers with assist:: Supervision/Verbal cueing   Problem: RH Furniture Transfers Goal: LTG Patient will perform furniture transfers w/assist (OT/PT) Description: LTG: Patient will perform furniture transfers  with assistance (OT/PT). Flowsheets (Taken 07/02/2023 1553) LTG: Pt will perform furniture transfers with assist:: Supervision/Verbal cueing   Problem: RH Ambulation Goal: LTG Patient  will ambulate in home environment (PT) Description: LTG: Patient will ambulate in home environment, # of feet with assistance (PT). Flowsheets (Taken 07/02/2023 1553) LTG: Pt will ambulate in home environ  assist needed:: Supervision/Verbal cueing LTG: Ambulation distance in home environment: 50' Goal: LTG Patient will ambulate in community environment (PT) Description: LTG: Patient will ambulate in community environment, # of feet with assistance (PT). Flowsheets (Taken 07/02/2023 1553) LTG: Pt will ambulate in community environ  assist needed:: Supervision/Verbal cueing LTG: Ambulation distance in community environment: 150'   Problem: RH Wheelchair Mobility Goal: LTG Patient will propel w/c in community environment (PT) Description: LTG: Patient will propel wheelchair in community environment, # of feet with assist (PT) Flowsheets (Taken 07/02/2023 1553) LTG: Pt will propel w/c in community environ  assist needed:: Supervision/Verbal cueing Distance: wheelchair distance in controlled environment: 150   Problem: RH Stairs Goal: LTG Patient will ambulate up and down stairs w/assist (PT) Description: LTG: Patient will ambulate up and down # of stairs with assistance (PT) Flowsheets (Taken 07/02/2023 1553) LTG: Pt will ambulate up/down stairs assist needed:: Supervision/Verbal cueing LTG: Pt will  ambulate up and down number of stairs: 4

## 2023-07-02 NOTE — Progress Notes (Signed)
PROGRESS NOTE   Subjective/Complaints:  No events overnight.  Nursing reports some straining with BM this a.m., patient requesting increase in bowel regimen. Also reports patient was restless overnight, he endorses that he does not use anything to sleep at home, does not generally have difficulty sleeping.  Reported agitation. Patient is up at sink shaving this a.m.; has no complaints, concerns, or questions.  AM labs with AKI 1.05 -> 1.22, BUN 24; otherwise stable POs 25-50%. Continent b/b, LBM 7/25  ROS: Denies fevers, chills, N/V, abdominal pain, constipation, diarrhea, SOB, cough, chest pain, new weakness or paraesthesias.    Objective:   No results found. Recent Labs    07/01/23 1525 07/02/23 0539  WBC 7.6 6.5  HGB 13.4 13.2  HCT 39.4 38.9*  PLT 252 239   Recent Labs    07/01/23 1253 07/02/23 0539  NA  --  139  K  --  3.7  CL  --  106  CO2  --  22  GLUCOSE  --  109*  BUN  --  24*  CREATININE 1.09 1.22  CALCIUM  --  8.6*    Intake/Output Summary (Last 24 hours) at 07/02/2023 0917 Last data filed at 07/02/2023 0900 Gross per 24 hour  Intake 352 ml  Output 600 ml  Net -248 ml        Physical Exam: Vital Signs Blood pressure 137/73, pulse 74, temperature 97.7 F (36.5 C), resp. rate 16, height 5\' 11"  (1.803 m), weight 93.2 kg, SpO2 95%. Constitutional: No apparent distress. Appropriate appearance for age.  Sitting up edge sink, shaving HENT: No JVD. Neck Supple. Trachea midline. Atraumatic, normocephalic. Eyes: PERRLA. EOMI. Visual fields grossly intact.  Cardiovascular: RRR, no murmurs/rub/gallops. No Edema. Peripheral pulses 2+  Respiratory: CTAB. No rales, rhonchi, or wheezing. On RA.  Abdomen: + bowel sounds, normoactive. No distention or tenderness.  Skin: C/D/I. No apparent lesions. MSK:      No apparent deformity.  Neurologic exam:  Strength: RUE- biceps 4+/5; Triceps 4/5; WE 4+/5; Grip  4=/5 and FA 3+/5 LUE 5-/5 in same muscles RLE- 4+/5 in HF, KE, KF, DF and PF LLE- 5-/5 in same muscles Cognition: AAO to person, place, and partially to time Language: Fluent, No substitutions or neoglisms. No dysarthria.  Memory: Moderate to severe deficits Insight: Poor insight into current condition.  Mood: Endorses some depression, no active SI.  Appropriate affect. Sensation: Equal and intact in BL UE and Les.  Reflexes: 2+ in BL UE and LEs. Negative Hoffman's and babinski signs bilaterally.  CN: 2-12 grossly intact.  Coordination: No apparent tremors. No ataxia on FTN, HTS bilaterally.  Spasticity: MAS 0 in all extremities.         Assessment/Plan: 1. Functional deficits which require 3+ hours per day of interdisciplinary therapy in a comprehensive inpatient rehab setting. Physiatrist is providing close team supervision and 24 hour management of active medical problems listed below. Physiatrist and rehab team continue to assess barriers to discharge/monitor patient progress toward functional and medical goals  Care Tool:  Bathing              Bathing assist       Upper Body  Dressing/Undressing Upper body dressing        Upper body assist      Lower Body Dressing/Undressing Lower body dressing            Lower body assist       Toileting Toileting    Toileting assist       Transfers Chair/bed transfer  Transfers assist           Locomotion Ambulation   Ambulation assist              Walk 10 feet activity   Assist           Walk 50 feet activity   Assist           Walk 150 feet activity   Assist           Walk 10 feet on uneven surface  activity   Assist           Wheelchair     Assist               Wheelchair 50 feet with 2 turns activity    Assist            Wheelchair 150 feet activity     Assist          Blood pressure 137/73, pulse 74, temperature 97.7 F  (36.5 C), resp. rate 16, height 5\' 11"  (1.803 m), weight 93.2 kg, SpO2 95%.  Medical Problem List and Plan: 1. Functional deficits secondary to ischemic infarction left internal capsule as well as history of prior left brain infarcts             -patient may  shower             -ELOS/Goals: 10-14 days -supervision             Admit to CIR  -7-26: Report per wife she fell at home and broke her wrist, is currently hospitalized.  No others caregiver support available at home.    2.  Antithrombotics: -DVT/anticoagulation:  Pharmaceutical: Lovenox             -antiplatelet therapy: Aspirin 81 mg daily and Brilinta 90 mg twice daily x 4 weeks then aspirin alone-since failed Plavix 3. Pain Management: Tylenol as needed 4. Mood/Behavior/Sleep: Namenda 10 mg twice daily.  Trazodone 50 mg as needed for sleep.             -antipsychotic agents: Provide emotional support  -Endorsing some very mild passive SI to PT; start on fluoxetine 10 mg daily, scheduled for neuropsych eval  5. Neuropsych/cognition: This patient is not capable of making decisions on his own behalf.  6. Skin/Wound Care: Routine skin checks 7. Fluids/Electrolytes/Nutrition: Routine and helpful follow-up chemistries  -Mild AKI on admission documentation, encourage p.o. intakes, recheck Monday 8.  Hypertension.  Norvasc 5 mg daily, lisinopril 20 mg daily.  Monitor with increased mobility  Well-controlled    07/02/2023    1:22 PM 07/02/2023    8:17 AM 07/02/2023    4:22 AM  Vitals with BMI  Systolic 132 137 295  Diastolic 74 73 73  Pulse 73 74 70     9.  Hyperlipidemia.  Lipitor 10.  BPH/Urinary urgency/frequency/incontinence- suggest timed voiding. Check PVR.  No documented retention 11.  Aortic stenosis status post TAVR March 2021 as well as history of CAD with stenting.  Follow-up outpatient with Dr. Gypsy Balsam. 12. Hx of mild dementia- on Namenda    LOS:  1 days A FACE TO FACE EVALUATION WAS PERFORMED  Angelina Sheriff 07/02/2023, 9:17 AM

## 2023-07-02 NOTE — Progress Notes (Signed)
Inpatient Rehabilitation  Patient information reviewed and entered into eRehab system by Melissa M. Bowie, M.A., CCC/SLP, PPS Coordinator.  Information including medical coding, functional ability and quality indicators will be reviewed and updated through discharge.    

## 2023-07-02 NOTE — Plan of Care (Signed)
  Problem: RH Cognition - SLP Goal: RH LTG Patient will demonstrate orientation with cues Description:  LTG:  Patient will demonstrate orientation to person/place/time/situation with cues (SLP)   Flowsheets (Taken 07/02/2023 1153) LTG: Patient will demonstrate orientation using cueing (SLP): Supervision   Problem: RH Comprehension Communication Goal: LTG Patient will comprehend basic/complex auditory (SLP) Description: LTG: Patient will comprehend basic/complex auditory information with cues (SLP). Flowsheets (Taken 07/02/2023 1153) LTG: Patient will comprehend auditory information with cueing (SLP): Supervision   Problem: RH Expression Communication Goal: LTG Patient will increase speech intelligibility (SLP) Description: LTG: Patient will increase speech intelligibility at word/phrase/conversation level with cues, % of the time (SLP) Flowsheets (Taken 07/02/2023 1153) LTG: Patient will increase speech intelligibility (SLP): Supervision Goal: LTG Patient will increase word finding of common (SLP) Description: LTG:  Patient will increase word finding of common objects/daily info/abstract thoughts with cues using compensatory strategies (SLP). Flowsheets (Taken 07/02/2023 1153) LTG: Patient will increase word finding of common (SLP): Supervision

## 2023-07-02 NOTE — Progress Notes (Signed)
Occupational Therapy Assessment and Plan  Patient Details  Name: Michael Kidd MRN: [id number] Date of Birth: [birth year]  OT Diagnosis: cognitive deficits, hemiplegia affecting dominant side, and muscle weakness (generalized) Rehab Potential: Rehab Potential (ACUTE ONLY): Good ELOS: 18-21 days   Today's Date: 07/02/2023 OT Individual Time: 6644-0347 OT Individual Time Calculation (min): 60 min     Hospital Problem: Principal Problem:   Ischemic cerebrovascular accident (CVA) (HCC)   Past Medical History:  Past Medical History:  Diagnosis Date   Allergic rhinitis 03/29/2014   Aortic valve stenosis 05/23/2020   Arthralgia 07/15/2016   Arthritis    Benign prostatic hyperplasia 02/14/2014   Benign prostatic hyperplasia with urinary frequency 11/02/2018   Bilateral chronic knee pain    BPH (benign prostatic hyperplasia)    CAD (coronary artery disease)    s/p remote LAD PCI   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 PHQ-9 completed 02/26/14 PHQ-2 completed 02/14/14   Dizziness 03/12/2014   Dupuytren's contracture 07/15/2016   Dyspnea on exertion 08/15/2020   Dysthymia    Essential hypertension 03/12/2014   Flexor tendon bowstring 01/06/2016   Gastroesophageal reflux disease 08/18/2016   Hepatic steatosis 03/02/2014   History of CVA (cerebrovascular accident) 10/12/2019   HLD (hyperlipidemia) 02/14/2014   HTN (hypertension)    Late effect of cerebrovascular accident (CVA) 10/19/2019   Melanoma in situ of right upper extremity including shoulder (HCC) 08/07/2016   Formatting of this note might be different from the original. August 2017.  Dr. Chancy Milroy, [facility].   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   Short of breath on exertion    per patient   Stroke [facility])    November   Symptomatic severe aortic stenosis with normal ejection fraction 08/15/2020   Past Surgical History:  Past Surgical History:  Procedure Laterality Date   CATARACT EXTRACTION W/ INTRAOCULAR LENS  IMPLANT, BILATERAL     cataract repair     HERNIA REPAIR     unsure of date   LOOP RECORDER INSERTION N/A 08/28/2019   Procedure: LOOP RECORDER INSERTION;  Surgeon: Marinus Maw, MD;  Location: [facility] INVASIVE CV LAB;  Service: Cardiovascular;  Laterality: N/A;   PERCUTANEOUS CORONARY STENT INTERVENTION (PCI-S)     RIGHT/LEFT HEART CATH AND CORONARY ANGIOGRAPHY N/A 08/22/2020   Procedure: RIGHT/LEFT HEART CATH AND CORONARY ANGIOGRAPHY;  Surgeon: Marykay Lex, MD;  Location: [facility] INVASIVE CV LAB;  Service: Cardiovascular;  Laterality: N/A;   TEE WITHOUT CARDIOVERSION N/A 09/24/2020   Procedure: TRANSESOPHAGEAL ECHOCARDIOGRAM (TEE);  Surgeon: Tonny Bollman, MD;  Location: [facility] OR;  Service: Open Heart Surgery;  Laterality: N/A;   TRANSCATHETER AORTIC VALVE REPLACEMENT, TRANSFEMORAL N/A 09/24/2020   Procedure: TRANSCATHETER AORTIC VALVE REPLACEMENT, TRANSFEMORAL - USING EDWARDS SAPIEN 3 ULTRA  VALVE SIZE ;  Surgeon: Tonny Bollman, MD;  Location: [facility] OR;  Service: Open Heart Surgery;  Laterality: N/A;    Assessment & Plan Clinical Impression: Patient is a 89 y.o. year old male with recent admission to the hospital on 7/17  with slurred speech, facial droop and R sided deficits. MRI brain showed small area of acute ischemia of L corpus striatum. PMH: HTN, CVA, depression, BPH, aortic stenosis status post TAVR, CAD s/p stenting.  Patient transferred to CIR on 07/01/2023 .    Patient currently requires mod with basic self-care  skills secondary to muscle weakness, decreased cardiorespiratoy endurance, decreased initiation, decreased attention, decreased awareness, and decreased safety awareness, and decreased sitting balance, decreased standing balance, decreased postural control, hemiplegia, and decreased balance strategies.  Prior to hospitalization, patient could complete BADL with modified independent /supervision.   Patient will benefit from skilled intervention to increase independence with basic self-care skills prior to discharge home with care partner.  Anticipate patient will require 24 hour supervision and follow up home health.  OT - End of Session Endurance Deficit: Yes Endurance Deficit Description: Rest breaks within BADL tasks OT Assessment Rehab Potential (ACUTE ONLY): Good OT Barriers to Discharge: Decreased caregiver support OT Barriers to Discharge Comments: Spouse can only provide supervision assist OT Patient demonstrates impairments in the following area(s): Balance;Cognition;Endurance;Motor OT Basic ADL's Functional Problem(s): Grooming;Eating;Bathing;Dressing;Toileting OT Transfers Functional Problem(s): Toilet OT Additional Impairment(s): Fuctional Use of Upper Extremity OT Plan OT Intensity: Minimum of 1-2 x/day, 45 to 90 minutes OT Frequency: 5 out of 7 days OT Duration/Estimated Length of Stay: 18-21 days OT Treatment/Interventions: Balance/vestibular training;Cognitive remediation/compensation;Community reintegration;Discharge planning;Disease mangement/prevention;DME/adaptive equipment instruction;Functional electrical stimulation;Functional mobility training;Neuromuscular re-education;Pain management;Patient/family education;Psychosocial support;Self Care/advanced ADL retraining;Skin care/wound managment;Splinting/orthotics;Therapeutic Activities;Therapeutic Exercise;UE/LE Strength taining/ROM;UE/LE Coordination activities;Visual/perceptual remediation/compensation;Wheelchair  propulsion/positioning OT Self Feeding Anticipated Outcome(s): mod I OT Basic Self-Care Anticipated Outcome(s): Supervision OT Toileting Anticipated Outcome(s): Supervision OT Bathroom Transfers Anticipated Outcome(s): Supervision OT Recommendation Patient destination: Home Follow Up Recommendations: Home health OT Equipment Recommended: To be determined   OT Evaluation Precautions/Restrictions  Precautions Precautions: Fall Restrictions Weight Bearing Restrictions: No Pain Pain Assessment Pain Scale: 0-10 Pain Score: 0-No pain Home Living/Prior Functioning Home Living Living Arrangements: Spouse/significant other Available Help at Discharge: Family Type of Home: House Home Access: Stairs to enter Secretary/administrator of Steps: 3 Entrance Stairs-Rails: Right, Left Home Layout: One level Bathroom Shower/Tub: Sponge bathes at baseline Bathroom Toilet: Standard Additional Comments: information taken from chart review. 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"-per chart review Vision Ability to See in Adequate Light: 0 Adequate Patient Visual Report: No change from baseline Cognition Cognition Overall Cognitive Status: No family/caregiver present to determine baseline cognitive functioning Arousal/Alertness: Awake/alert Orientation Level: Person;Situation;Place Person: Oriented Place: Oriented Situation: Oriented Memory: Impaired Awareness: Impaired Problem Solving: Impaired Problem Solving Impairment: Functional basic Safety/Judgment: Impaired Brief Interview for Mental Status (BIMS) Repetition of Three Words (First Attempt): 3 Temporal Orientation: Year: Missed by more than 5 years Temporal Orientation: Month: Missed  by more than 1 month Temporal Orientation: Day: Incorrect Recall: "Sock": No, could not recall Recall: "Blue": No, could not recall Recall: "Bed": No, could not recall BIMS Summary Score: 3 Sensation Sensation Light Touch: Appears Intact Coordination Gross Motor Movements are Fluid and Coordinated: No Fine Motor Movements are Fluid and Coordinated: No Coordination and Movement Description: R hemiplegia, decreased smoothness and accracy with R hand Motor  Motor Motor: Hemiplegia Motor - Skilled Clinical Observations: R hemiplegia  Trunk/Postural Assessment     Balance Balance Balance Assessed: Yes  Static Sitting Balance Static Sitting - Balance Support: Feet supported Static Sitting - Level of Assistance: 5: Stand by assistance Dynamic Sitting Balance Dynamic Sitting - Balance Support: Feet supported Dynamic Sitting - Level of Assistance: 5: Stand by assistance;4: Min assist Static Standing Balance Static Standing - Balance Support: During functional activity Static Standing - Level of Assistance: 4: Min assist Dynamic Standing Balance Dynamic Standing - Balance Support: During functional activity Dynamic Standing - Level of Assistance: 3: Mod assist Extremity/Trunk Assessment RUE Assessment RUE Assessment: Exceptions to [facility] Active Range of Motion (AROM) Comments: shoulder FF limited to ~70 degrees, 3/5 strength grossly LUE Assessment LUE Assessment: Exceptions to [facility] Strength Comments: Shoulder FF limited to ~ 90 degrees at baseline  Care Tool Care Tool Self Care Eating   Eating Assist Level: Supervision/Verbal cueing    Oral Care    Oral Care Assist Level: Supervision/Verbal cueing    Bathing   Body parts bathed by patient: Right arm;Left arm;Chest;Abdomen;Front perineal area;Buttocks;Right upper leg;Right lower leg;Face;Left lower leg;Left upper leg     Assist Level: Moderate Assistance - Patient 50 - 74%    Upper Body Dressing(including orthotics)    What is the patient wearing?: Pull over shirt   Assist Level: Minimal Assistance - Patient > 75%    Lower Body Dressing (excluding footwear)   What is the patient wearing?: Pants Assist for lower body dressing: Maximal Assistance - Patient 25 - 49%    Putting on/Taking off footwear   What is the patient wearing?: Non-skid slipper socks Assist for footwear: Maximal Assistance - Patient 25 - 49%       Care Tool Toileting Toileting activity   Assist for toileting: Maximal Assistance - Patient 25 - 49%     Care Tool Bed Mobility Roll left and right activity        Sit to lying activity        Lying to sitting on side of bed activity   Lying to sitting on side of bed assist level: the ability to move from lying on the back to sitting on the side of the bed with no back support.: Minimal Assistance - Patient > 75%     Care Tool Transfers Sit to stand transfer   Sit to stand assist level: Maximal Assistance - Patient 25 - 49%    Chair/bed transfer   Chair/bed transfer assist level: Moderate Assistance - Patient 50 - 74%     Toilet transfer   Assist Level: Moderate Assistance - Patient 50 - 74%     Care Tool Cognition  Expression of Ideas and Wants Expression of Ideas and Wants: 3. Some difficulty - exhibits some difficulty with expressing needs and ideas (e.g, some words or finishing thoughts) or speech is not clear  Understanding Verbal and Non-Verbal Content Understanding Verbal and Non-Verbal Content: 2. Sometimes understands - understands only basic conversations or simple, direct phrases. Frequently requires cues to understand   Memory/Recall Ability Memory/Recall Ability : That he or she is in a hospital/hospital unit   Refer to Care Plan for Long Term Goals  SHORT TERM GOAL WEEK 1 OT Short Term Goal 1 (Week 1): Patient will complete LB dressing with mod A OT Short Term Goal 2 (Week 1): Patient will recall hemi dressing techniqes with min questioning cues OT Short  Term Goal 3 (Week 1): Patient will perform sit<.stand with consistent mod A and LRAD  Recommendations for other services: None    Skilled Therapeutic Intervention Pt greeted semi-reclined  In bed awake and agreeable to OT eval an treat. OT eval completed addressing rehab process, OT purpose, POC, ELOS, and goals. Transfers completed sit<>stand with mod up to max A when fatigued. Mod A to pivot. Shaving task from seated position at the sink with pt able to manipulate razor with R hand with min A. Pt completed UB bathing/dressing with Min A, LB bathing/dressing completed with mod A overall. Sit<>stands at the sink with max A when LB fatigued. Once powered up, he could maintain balance with min A. Pt left seated in wc at end of session with alarm belt on, call bell in reach, and needs met. See below for further details regarding BADL performance.   ADL ADL Eating: Set up Grooming: Minimal assistance Upper Body Bathing: Minimal assistance Lower Body Bathing: Moderate assistance Upper Body Dressing: Minimal assistance Lower Body Dressing: Maximal assistance Toileting: Maximal assistance;Moderate assistance Toilet Transfer: Moderate assistance;Maximal assistance Mobility  Bed Mobility Bed Mobility: Sit to Supine;Supine to Sit Supine to Sit: Minimal Assistance - Patient > 75% Sit to Supine: Minimal Assistance - Patient > 75% Transfers Sit to Stand: Maximal Assistance - Patient 25-49% Stand to Sit: Maximal Assistance - Patient 25-49%   Discharge Criteria: Patient will be discharged from OT if patient refuses treatment 3 consecutive times without medical reason, if treatment goals not met, if there is a change in medical status, if patient makes no progress towards goals or if patient is discharged from hospital.  The above assessment, treatment plan, treatment alternatives and goals were discussed and mutually agreed upon: by patient  Mal Amabile 07/02/2023, 1:06 PM

## 2023-07-02 NOTE — Progress Notes (Signed)
Inpatient Rehabilitation Care Coordinator Assessment and Plan Patient Details  Name: Michael Kidd MRN: [id number] Date of Birth: Aug 27, 1930  Today's Date: 07/02/2023  Hospital Problems: Principal Problem:   Ischemic cerebrovascular accident (CVA) [facility])  Past Medical History:  Past Medical History:  Diagnosis Date   Allergic rhinitis 03/29/2014   Aortic valve stenosis 05/23/2020   Arthralgia 07/15/2016   Arthritis    Benign prostatic hyperplasia 02/14/2014   Benign prostatic hyperplasia with urinary frequency 11/02/2018   Bilateral chronic knee pain    BPH (benign prostatic hyperplasia)    CAD (coronary artery disease)    s/p remote LAD PCI   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 PHQ-9 completed 02/26/14 PHQ-2 completed 02/14/14   Dizziness 03/12/2014   Dupuytren's contracture 07/15/2016   Dyspnea on exertion 08/15/2020   Dysthymia    Essential hypertension 03/12/2014   Flexor tendon bowstring 01/06/2016   Gastroesophageal reflux disease 08/18/2016   Hepatic steatosis 03/02/2014   History of CVA (cerebrovascular accident) 10/12/2019   HLD (hyperlipidemia) 02/14/2014   HTN (hypertension)    Late effect of cerebrovascular accident (CVA) 10/19/2019   Melanoma in situ of right upper extremity including shoulder (HCC) 08/07/2016   Formatting of this note might be different from the original. August 2017.  Dr. Chancy Milroy, [facility].   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   Short of breath on exertion     per patient   Stroke [facility])    November   Symptomatic severe aortic stenosis with normal ejection fraction 08/15/2020   Past Surgical History:  Past Surgical History:  Procedure Laterality Date   CATARACT EXTRACTION W/ INTRAOCULAR LENS  IMPLANT, BILATERAL     cataract repair     HERNIA REPAIR     unsure of date   LOOP RECORDER INSERTION N/A 08/28/2019   Procedure: LOOP RECORDER INSERTION;  Surgeon: Marinus Maw, MD;  Location: [facility] INVASIVE CV LAB;  Service: Cardiovascular;  Laterality: N/A;   PERCUTANEOUS CORONARY STENT INTERVENTION (PCI-S)     RIGHT/LEFT HEART CATH AND CORONARY ANGIOGRAPHY N/A 08/22/2020   Procedure: RIGHT/LEFT HEART CATH AND CORONARY ANGIOGRAPHY;  Surgeon: Marykay Lex, MD;  Location: [facility] INVASIVE CV LAB;  Service: Cardiovascular;  Laterality: N/A;   TEE WITHOUT CARDIOVERSION N/A 09/24/2020   Procedure: TRANSESOPHAGEAL ECHOCARDIOGRAM (TEE);  Surgeon: Tonny Bollman, MD;  Location: [facility] OR;  Service: Open Heart Surgery;  Laterality: N/A;   TRANSCATHETER AORTIC VALVE REPLACEMENT, TRANSFEMORAL N/A 09/24/2020   Procedure: TRANSCATHETER AORTIC VALVE REPLACEMENT, TRANSFEMORAL - USING EDWARDS SAPIEN 3 ULTRA  VALVE SIZE ;  Surgeon: Tonny Bollman, MD;  Location: [facility] OR;  Service: Open Heart Surgery;  Laterality: N/A;   Social History:  reports that he has quit smoking. He has never used smokeless tobacco. He reports that he does not currently use alcohol. He reports that he does not use drugs.  Family / Support Systems Marital Status: Married How Long?: 66  years (anniversary in December) Patient Roles: Spouse Spouse/Significant Other: Talbert Forest (wife) Children: 2 children. 1 dtr  living that is on disablity and not able to help. Son is deceased (10 yrs). Other Supports: None reported Anticipated Caregiver: Wife Ability/Limitations of Caregiver: Wife broke wrist on Thursday and is not able to do any phsical assistance. Only supervision Caregiver Availability:  24/7 Family Dynamics: Pt and wife live together.  Social History Preferred language: English Religion: Christian Cultural Background: Pt worked in [employer] and [employer] until retirement. Retired from [employer] at age 56. Education: 5ht grade. Health Literacy - How often do you need to have someone help you when you read instructions, pamphlets, or other written material from your doctor or pharmacy?: Often Writes: Yes Employment Status: Retired Age Retired: 69 Marine scientist Issues: Denies Guardian/Conservator: No HCPOA   Abuse/Neglect Abuse/Neglect Assessment Can Be Completed: Unable to assess, patient is non-responsive or altered mental status Physical Abuse: Denies Verbal Abuse: Denies Sexual Abuse: Denies Exploitation of patient/patient's resources: Denies Self-Neglect: Denies  Patient response to: Social Isolation - How often do you feel lonely or isolated from those around you?: Patient unable to respond  Emotional Status Pt's affect, behavior and adjustment status: Pt pleasant at time of encounter. He was not able to undertand SW questions. Recent Psychosocial Issues: See below Psychiatric History: Wife reports that he is often a "downer" and she has to encourage him to have a better attitude. Substance Abuse History: PT wife reports that he quit smoking cigarettes in 1968 when he got "saved," and quit drinking beer years ago. She denies rec drug use.  Patient / Family Perceptions, Expectations & Goals Pt/Family understanding of illness & functional limitations: Pt wife has general understanding of pt care needs Premorbid pt/family roles/activities: Independent Anticipated changes in roles/activities/participation: Assistance with ADLs/IADLs Pt/family expectations/goals: Pt wife would like for him to be as independent as possible since she cannot physically assist him.  Community Resources [employer]: None Premorbid Home Care/DME  Agencies: None Transportation available at discharge: TBD Is the patient able to respond to transportation needs?: Yes In the past 12 months, has lack of transportation kept you from medical appointments or from getting medications?: No In the past 12 months, has lack of transportation kept you from meetings, work, or from getting things needed for daily living?: No Resource referrals recommended: Neuropsychology  Discharge Planning Living Arrangements: Spouse/significant other Support Systems: Spouse/significant other Type of Residence: Private residence Insurance Resources: Media planner (specify) Administrator Medicare) Financial Resources: Restaurant manager, fast food Screen Referred: No Living Expenses: Own Money Management: Spouse Does the patient have any problems obtaining your medications?: No Home Management: Pt wife manages all homecare Patient/Family Preliminary Plans: TBD Care Coordinator Barriers to Discharge: Decreased caregiver support, Lack of/limited family support, Insurance for SNF coverage Care Coordinator Anticipated Follow Up Needs: HH/OP  Clinical Impression SW made efforts to complete assessment with SW, however, pt appeared to be confused with SW questions. SW completed assessment with collateral reports, primarily his wife. 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 No HCPOA. Has ramped entrance.   Wisam Siefring A Jenifer Struve 07/02/2023, 1:11 PM

## 2023-07-03 DIAGNOSIS — I10 Essential (primary) hypertension: Secondary | ICD-10-CM

## 2023-07-03 DIAGNOSIS — F32A Depression, unspecified: Secondary | ICD-10-CM

## 2023-07-03 DIAGNOSIS — N179 Acute kidney failure, unspecified: Secondary | ICD-10-CM

## 2023-07-03 DIAGNOSIS — N400 Enlarged prostate without lower urinary tract symptoms: Secondary | ICD-10-CM

## 2023-07-03 NOTE — Progress Notes (Signed)
Occupational Therapy Session Note  Patient Details  Name: Michael Kidd MRN: [id number] Date of Birth: 1930/03/29  Today's Date: 07/03/2023 OT Individual Time: 1030-1100 OT Individual Time Calculation (min): 30 min    Short Term Goals: Week 1:  OT Short Term Goal 1 (Week 1): Patient will complete LB dressing with mod A OT Short Term Goal 2 (Week 1): Patient will recall hemi dressing techniqes with min questioning cues OT Short Term Goal 3 (Week 1): Patient will perform sit<.stand with consistent mod A and LRAD  Skilled Therapeutic Interventions/Progress Updates:  (1st) OT Treatment Session: Patient seen this day for skilled OT. Patient had no report of pain at the time of treatment, patient indicated that he rested well and was willing to participate in OT treatment.  The pt tolerated manual manipulation of the scapular and surrounding structures to improve communication to the joint . The pt was then able to engaged in PROM/AROM 2 sets of 10 each to improve mobility with his RUE for opportunities to perform strength training with  >  fluency of the extremity.  The pt  was able to complete dowel rod exercise using 1lb dowel with attention on symmetry of the R and LUE during shld flexion, horizontal abduction, and shld rotation 1 set of 10 for each exercise.  The pt was instructed to incorporate his eyes when performing the exercise  to improve his outcome. At the end of the treatment session , the pt remained at w/c LOF with his chair alarm activated and his bed side table within reach.  All additional needs were addressed prior to exiting the room.    (2nd) OT Treatment Session:The pt was in bed at the time of arrival, but was in agreement with completing skilled OT to improve his functional outcome. The pt completed exercises using the 1lb dowel for bring BUE into shld flexion 2 sets of 10 with 1 rest break.  The pt went on to complete UB exercises using the yellow theraband for chest press, and  horizontal abduction 2 sets of 10 with rest breaks as needed, the pt required 1 rest break. The pt completed the session by completing a toilet transfer from w/c LOF using the grab bars and the arm of the w/c for additional balance at CGA. The pt was able to doff his brief and pants with CGA and go from standing to sitting using the grab bars  for  placement onto the commode with MinA.  The pt was able to complete toileting hygiene with SBA. The pt was ModA for donning his brief and pants secondary to challenges with coordination. The pt was transported to his living quarters and was able to wash his hands with s/u assist . The patient transferred back to bed LOF with MinA for managing BLE.  The call light and bedside table were placed within reach and all additional needs were addressed prior to exiting the room.      Therapy Documentation Precautions:  Precautions Precautions: Fall Restrictions Weight Bearing Restrictions: No  Therapy/Group: Individual Therapy  Lavona Mound 07/03/2023, 12:45 PM

## 2023-07-03 NOTE — Progress Notes (Signed)
Speech Language Pathology Daily Session Note  Patient Details  Name: Michael Kidd MRN: [id number] Date of Birth: 1930/04/10  Today's Date: 07/03/2023 SLP Individual Time: 0730-0830 SLP Individual Time Calculation (min): 60 min  Short Term Goals: Week 1: SLP Short Term Goal 1 (Week 1): Pt will utilize compensatory word finding strategies to repair communication breakdowns with >75% accuracy given min cues SLP Short Term Goal 2 (Week 1): Pt will follow 2-3 step auditory commands with >75% accuracy given min cues. SLP Short Term Goal 3 (Week 1): Pt will utilize external aids to answer orientation questions with >80% accuracy  Skilled Therapeutic Interventions:   Pt seen for skilled ST with focus on cognitive communication goals, pt receiving sitting EOB with AM meal and nursing present for med pass. SLP facilitating following 2-3 step directions during functional tasks by providing overall mod verbal cues for 70% accuracy. Pt benefits from increased volume and repetition of information to increase accuracy, question HOH however patient denies. Pt demonstrates some confusion with multistep directions, putting syrup in oatmeal vs on french toast, etc. SLP attempting to have patient verbally repeat directions before completing which increased effectiveness. Pt continues to demonstrate occasional word finding challenges during structured and non structured tasks. Pt initially perseverating on going home today but was easily redirected with education and visual orientation aids. Pt able to recall "I had a stroke" and benefits from verbal cues to increase awareness of deficits s/p CVA. SLP utilizing July calendar to orient patient to DOW, MOY, date, admission to hospital, etc. Pt re-educated on use of call button for all assistance. Pt was left in bed with alarm set and all needs met, cont ST POC.   Pain Pain Assessment Pain Scale: 0-10 Pain Score: 0-No pain  Therapy/Group: Individual Therapy  Tacey Ruiz 07/03/2023, 8:17 AM

## 2023-07-03 NOTE — Progress Notes (Signed)
PROGRESS NOTE   Subjective/Complaints: No acute events overnight.  No new complaints elicited however patient indicates he is anxious to get home.   ROS: Denies fevers, chills, rash, N/V, abdominal pain, constipation, diarrhea, SOB, cough, chest pain, new weakness or paraesthesias.    Objective:   No results found. Recent Labs    07/01/23 1525 07/02/23 0539  WBC 7.6 6.5  HGB 13.4 13.2  HCT 39.4 38.9*  PLT 252 239   Recent Labs    07/01/23 1253 07/02/23 0539  NA  --  139  K  --  3.7  CL  --  106  CO2  --  22  GLUCOSE  --  109*  BUN  --  24*  CREATININE 1.09 1.22  CALCIUM  --  8.6*    Intake/Output Summary (Last 24 hours) at 07/03/2023 1359 Last data filed at 07/03/2023 0820 Gross per 24 hour  Intake 320 ml  Output --  Net 320 ml        Physical Exam: Vital Signs Blood pressure 103/68, pulse 71, temperature 98 F (36.7 C), temperature source Oral, resp. rate 18, height 5\' 11"  (1.803 m), weight 93.2 kg, SpO2 97%. Constitutional: No apparent distress. Appropriate appearance for age.  Lying in bed   HENT: No JVD. Neck Supple. Trachea midline. Atraumatic, normocephalic. Eyes: PERRLA. EOMI. Visual fields grossly intact.  Cardiovascular: RRR, no murmurs/rub/gallops. No Edema.  Respiratory: CTAB. No rales, rhonchi, or wheezing. On RA.  Abdomen: + bowel sounds, normoactive. No distention or tenderness.  Skin: C/D/I. No apparent lesions. 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 Functional deficits which require 3+ hours per day of interdisciplinary therapy in a comprehensive inpatient rehab setting. Physiatrist is providing close team supervision and 24 hour management of active medical problems listed below. Physiatrist and rehab team continue to assess  barriers to discharge/monitor patient progress toward functional and medical goals  Care Tool:  Bathing    Body parts bathed by patient: Right arm, Left arm, Chest, Abdomen, Front perineal area, Buttocks, Right upper leg, Right lower leg, Face, Left lower leg, Left upper leg         Bathing assist Assist Level: Moderate Assistance - Patient 50 - 74%     Upper Body Dressing/Undressing Upper body dressing   What is the patient wearing?: Pull over shirt    Upper body assist Assist Level: Minimal Assistance - Patient > 75%    Lower Body Dressing/Undressing Lower body dressing      What is the patient wearing?: Pants     Lower body assist Assist for lower body dressing: Maximal Assistance - Patient 25 - 49%     Toileting Toileting    Toileting assist Assist for toileting: Maximal Assistance - Patient 25 - 49%     Transfers Chair/bed transfer  Transfers assist     Chair/bed transfer assist level: Moderate Assistance - Patient 50 - 74%     Locomotion Ambulation   Ambulation assist  Assist level: Minimal Assistance - Patient > 75% Assistive device: Walker-rolling Max distance: 180'   Walk 10 feet activity   Assist     Assist level: Minimal Assistance - Patient > 75% Assistive device: Walker-rolling   Walk 50 feet activity   Assist    Assist level: Minimal Assistance - Patient > 75% Assistive device: Walker-rolling    Walk 150 feet activity   Assist    Assist level: Minimal Assistance - Patient > 75% Assistive device: Walker-rolling    Walk 10 feet on uneven surface  activity   Assist     Assist level: Minimal Assistance - Patient > 75% Assistive device: Walker-rolling   Wheelchair     Assist Is the patient using a wheelchair?: Yes Type of Wheelchair: Manual    Wheelchair assist level: Dependent - Patient 0% Max wheelchair distance: 150'    Wheelchair 50 feet with 2 turns activity    Assist        Assist  Level: Dependent - Patient 0%   Wheelchair 150 feet activity     Assist      Assist Level: Dependent - Patient 0%   Blood pressure 103/68, pulse 71, temperature 98 F (36.7 C), temperature source Oral, resp. rate 18, height 5\' 11"  (1.803 m), weight 93.2 kg, SpO2 97%.  Medical Problem List and Plan: 1. Functional deficits secondary to ischemic infarction left internal capsule as well as history of prior left brain infarcts             -patient may  shower             -ELOS/Goals: 10-14 days -supervision             Admit to CIR  -7-26: Report per wife she fell at home and broke her wrist, is currently hospitalized.  No others caregiver support available at home.    2.  Antithrombotics: -DVT/anticoagulation:  Pharmaceutical: Lovenox             -antiplatelet therapy: Aspirin 81 mg daily and Brilinta 90 mg twice daily x 4 weeks then aspirin alone-since failed Plavix 3. Pain Management: Tylenol as needed 4. Mood/Behavior/Sleep: Namenda 10 mg twice daily.  Trazodone 50 mg as needed for sleep.             -antipsychotic agents: Provide emotional support  -Endorsing some very mild passive SI to PT; start on fluoxetine 10 mg daily, scheduled for neuropsych eval  -7/27 did not endorse SI today however indicates he is very anxious to get home  5. Neuropsych/cognition: This patient is not capable of making decisions on his own behalf.  6. Skin/Wound Care: Routine skin checks 7. Fluids/Electrolytes/Nutrition: Routine and helpful follow-up chemistries  -Mild AKI on admission documentation, encourage p.o. intakes, recheck Monday  -7/27 continue encourage p.o. fluid intake 8.  Hypertension.  Norvasc 5 mg daily, lisinopril 20 mg daily.  Monitor with increased mobility  7/27 BP well-controlled continue to monitor    07/03/2023    3:41 AM 07/02/2023    8:24 PM 07/02/2023    1:22 PM  Vitals with BMI  Systolic 103 102 161  Diastolic 68 60 74  Pulse 71 75 73     9.  Hyperlipidemia.   Lipitor 10.  BPH/Urinary urgency/frequency/incontinence- suggest timed voiding. Check PVR.  No documented retention  -7/27 continent of bladder, denies dysuria, will asked nursing to check PVR 11.  Aortic stenosis status post TAVR March 2021 as well as history of CAD with stenting.  Follow-up outpatient with Dr. Gypsy Balsam. 12. Hx of mild dementia- on Namenda    LOS: 2 days A FACE TO FACE EVALUATION WAS PERFORMED  Fanny Dance 07/03/2023, 1:59 PM

## 2023-07-03 NOTE — Progress Notes (Signed)
Patient ambulated well from bed to toilet, but was a little unsteady and slightly confused navigating in bathroom patient turned toward wall instead of door and had to be stabilized and redirected by this nurse. Patient ambulated well to bed after redirection.

## 2023-07-03 NOTE — Progress Notes (Signed)
Physical Therapy Session Note  Patient Details  Name: Michael Kidd MRN: [id number] Date of Birth: 02/27/1930  Today's Date: 07/03/2023 PT Individual Time: 0900-1000 PT Individual Time Calculation (min): 60 min   Short Term Goals: Week 1:  PT Short Term Goal 1 (Week 1): Patient will complete bed/chair transfer with LRAD and MinA PT Short Term Goal 2 (Week 1): Patient will ambulate >8' with LRAD and MinA PT Short Term Goal 3 (Week 1): Patient will ascend/descend x4 steps with BHR and MinA  Skilled Therapeutic Interventions/Progress Updates:  Patient greeted supine in bed and agreeable to PT treatment session. Patient requesting need to use the bathroom this morning- Patient transitioned to sitting EOB form a flat surface with the use of a bed rail and MinA for righting trunk with VC throughout for improved sequencing. Patient attempted to stand from lowered bed with RW and Mod/MaxA, however despite multiple attempts patient was unable to come to a standing position with reports of RLE discomfort/weakness. With raised bed, patient was able to stand with RW and ModA with max multimodal cues for improved sequencing. Patient gait trained from EOB to bathroom with RW and ModA- Multimodal cues throughout for stepping within the frame of the walker and increased RLE step length/foot clearance with minimal improvements noted. Patient demonstrated poor sequencing and eccentric control when transitioning to sitting on the toilet. Patient with continent void and BM (charted in flowsheet)- Patient performed back pericare with MinA for improved sitting balance. Patient then stood from toilet with R grab bar and RW and Mod/MaxA- Once standing, therapist performed back pericare in order to ensure cleanliness. Patient then gait trained to his wheelchair with RW and ModA and same VC as above. While seated in his wheelchair patient washed his face, donned deodorant and donned his shirt with MinA overall. Therapist  threaded brief and pants and donned tennis shoes for time management. Patient propelled manual wheelchair from his room to day room with BUE and supv- Increased time required to complete wc mobility with VC/TC for proper hand placement, propulsion technique and obstacle negotiation. While taking a seated rest break, therapist located new RW for patient in order to improve overall body mechanics and ease with functional mobility. Patient completed Kinetron 2 x 5 minutes on level 25 cm/sec with multimodal cues for increased RLE activation with good effort noted. Patient returned to his room and left sitting upright in wheelchair with call bell within reach, posey belt on and all needs met.    Therapy Documentation Precautions:  Precautions Precautions: Fall Restrictions Weight Bearing Restrictions: No  Pain: Patient reported Rt knee discomfort/weakness, however did not provide a number. With increased mobility, patient reported improvements. Pain reported when initially getting out of bed, however with time improved.    Therapy/Group: Individual Therapy  Calub Tarnow 07/03/2023, 10:25 AM

## 2023-07-04 MED ORDER — LISINOPRIL 10 MG PO TABS
10.0000 mg | ORAL_TABLET | Freq: Every day | ORAL | Status: DC
  Administered 2023-07-05 – 2023-07-06 (×2): 10 mg via ORAL
  Filled 2023-07-04 (×3): qty 1

## 2023-07-04 NOTE — Progress Notes (Addendum)
PROGRESS NOTE   Subjective/Complaints: No acute events overnight, no new concerns elicited this morning.   ROS: Denies fevers, chills, rash, N/V, abdominal pain, constipation, diarrhea, SOB, cough, chest pain, headache, new weakness or paraesthesias.    Objective:   No results found. Recent Labs    07/02/23 0539  WBC 6.5  HGB 13.2  HCT 38.9*  PLT 239   Recent Labs    07/02/23 0539  NA 139  K 3.7  CL 106  CO2 22  GLUCOSE 109*  BUN 24*  CREATININE 1.22  CALCIUM 8.6*    Intake/Output Summary (Last 24 hours) at 07/04/2023 1821 Last data filed at 07/04/2023 1753 Gross per 24 hour  Intake 651 ml  Output 600 ml  Net 51 ml        Physical Exam: Vital Signs Blood pressure (!) 116/57, pulse 68, temperature 97.8 F (36.6 C), temperature source Oral, resp. rate 18, height 5\' 11"  (1.803 m), weight 93.2 kg, SpO2 97%. Constitutional: No apparent distress. Appropriate appearance for age.  Lying in bed, appears comfortable  HENT: No JVD. Neck Supple. Trachea midline. Atraumatic, normocephalic. Eyes: Conjugate gaze Cardiovascular: RRR, no murmurs/rub/gallops. No Edema.  Respiratory: CTAB. No rales, rhonchi, or wheezing. On RA.  Abdomen: + bowel sounds, normoactive. No distention or tenderness.  Skin: C/D/I. No apparent lesions. 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 Functional deficits which require 3+ hours per day of interdisciplinary therapy in a comprehensive inpatient rehab setting. Physiatrist is providing close team supervision and 24 hour management of active medical problems listed below. Physiatrist and rehab team continue to assess barriers to discharge/monitor patient progress toward functional and medical goals  Care Tool:  Bathing    Body  parts bathed by patient: Right arm, Left arm, Chest, Abdomen, Front perineal area, Buttocks, Right upper leg, Right lower leg, Face, Left lower leg, Left upper leg         Bathing assist Assist Level: Moderate Assistance - Patient 50 - 74%     Upper Body Dressing/Undressing Upper body dressing   What is the patient wearing?: Pull over shirt    Upper body assist Assist Level: Minimal Assistance - Patient > 75%    Lower Body Dressing/Undressing Lower body dressing      What is the patient wearing?: Pants     Lower body assist Assist for lower body dressing: Maximal Assistance - Patient 25 - 49%     Toileting Toileting    Toileting assist Assist for toileting: Minimal Assistance - Patient > 75%     Transfers Chair/bed transfer  Transfers assist     Chair/bed transfer assist level: Moderate Assistance - Patient 50 - 74%     Locomotion Ambulation   Ambulation assist      Assist level: Minimal Assistance - Patient > 75% Assistive device: Walker-rolling Max distance: 180'   Walk 10 feet activity   Assist     Assist level: Minimal Assistance - Patient > 75% Assistive device: Walker-rolling  Walk 50 feet activity   Assist    Assist level: Minimal Assistance - Patient > 75% Assistive device: Walker-rolling    Walk 150 feet activity   Assist    Assist level: Minimal Assistance - Patient > 75% Assistive device: Walker-rolling    Walk 10 feet on uneven surface  activity   Assist     Assist level: Minimal Assistance - Patient > 75% Assistive device: Walker-rolling   Wheelchair     Assist Is the patient using a wheelchair?: Yes Type of Wheelchair: Manual    Wheelchair assist level: Dependent - Patient 0% Max wheelchair distance: 150'    Wheelchair 50 feet with 2 turns activity    Assist        Assist Level: Dependent - Patient 0%   Wheelchair 150 feet activity     Assist      Assist Level: Dependent - Patient  0%   Blood pressure (!) 116/57, pulse 68, temperature 97.8 F (36.6 C), temperature source Oral, resp. rate 18, height 5\' 11"  (1.803 m), weight 93.2 kg, SpO2 97%.  Medical Problem List and Plan: 1. Functional deficits secondary to ischemic infarction left internal capsule as well as history of prior left brain infarcts             -patient may  shower             -ELOS/Goals: 10-14 days -supervision             Admit to CIR  -7-26: Report per wife she fell at home and broke her wrist, is currently hospitalized.  No others caregiver support available at home.    -Ipoc completed 2.  Antithrombotics: -DVT/anticoagulation:  Pharmaceutical: Lovenox             -antiplatelet therapy: Aspirin 81 mg daily and Brilinta 90 mg twice daily x 4 weeks then aspirin alone-since failed Plavix 3. Pain Management: Tylenol as needed 4. Mood/Behavior/Sleep: Namenda 10 mg twice daily.  Trazodone 50 mg as needed for sleep.             -antipsychotic agents: Provide emotional support  -Endorsing some very mild passive SI to PT; start on fluoxetine 10 mg daily, scheduled for neuropsych eval  -7/27-8 did not endorse SI today however indicates he is very anxious to get home.    5. Neuropsych/cognition: This patient is not capable of making decisions on his own behalf.  6. Skin/Wound Care: Routine skin checks 7. Fluids/Electrolytes/Nutrition: Routine and helpful follow-up chemistries  -Mild AKI on admission documentation, encourage p.o. intakes, recheck Monday  -7/27 continue encourage p.o. fluid intake  -728 recheck tomorrow BMP ordered 8.  Hypertension.  Norvasc 5 mg daily, lisinopril 20 mg daily.  Monitor with increased mobility  7/28 decrease lisinopril to 10 mg daily    07/04/2023    1:41 PM 07/04/2023    5:12 AM 07/03/2023    7:23 PM  Vitals with BMI  Systolic 116 104 213  Diastolic 57 52 61  Pulse 68 61 67     9.  Hyperlipidemia.  Lipitor 10.  BPH/Urinary urgency/frequency/incontinence- suggest  timed voiding. Check PVR.  No documented retention  --7/28 remains continent, bladder scan yesterday 122 continue to monitor 11.  Aortic stenosis status post TAVR March 2021 as well as history of CAD with stenting.  Follow-up outpatient with Dr. Gypsy Balsam. 12. Hx of mild dementia- on Namenda    LOS: 3 days A FACE TO FACE EVALUATION WAS PERFORMED  Fanny Dance  07/04/2023, 6:21 PM

## 2023-07-04 NOTE — Plan of Care (Signed)
  Problem: RH BLADDER ELIMINATION Goal: RH STG MANAGE BLADDER WITH ASSISTANCE Description: STG Manage Bladder With min Assistance Outcome: Not Progressing; difficulty starting urine

## 2023-07-04 NOTE — IPOC Note (Signed)
Overall Plan of Care [facility]) Patient Details Name: Michael Kidd MRN: [id number] DOB: Nov 10, 1930  Admitting Diagnosis: Ischemic cerebrovascular accident (CVA) [facility])  Hospital Problems: Principal Problem:   Ischemic cerebrovascular accident (CVA) (HCC)     Functional Problem List: Nursing Safety, Sensory, Endurance, Medication Management  PT Balance, Behavior, Perception, Safety, Endurance, Motor  OT Balance, Cognition, Endurance, Motor  SLP Motor, Linguistic  TR         Basic ADL's: OT Grooming, Eating, Bathing, Dressing, Toileting     Advanced  ADL's: OT       Transfers: PT Bed Mobility, Bed to Chair, Car, Oncologist: PT Ambulation, Psychologist, prison and probation services, Stairs     Additional Impairments: OT Fuctional Use of Upper Extremity  SLP Communication comprehension, expression    TR      Anticipated Outcomes Item Anticipated Outcome  Self Feeding mod I  Swallowing      Basic self-care  Marketing executive Transfers Supervision  Bowel/Bladder  continent bowel/bladder  Transfers  Supv with LRAD  Locomotion  Supv with LRAD  Communication  Supervision  Cognition  Supervision  Pain  less than 4  Safety/Judgment      Therapy Plan: PT Intensity: Minimum of 1-2 x/day ,45 to 90 minutes PT Frequency: 5 out of 7 days PT Duration Estimated Length of Stay: 2-2.5 weeks OT Intensity: Minimum of 1-2 x/day, 45 to 90 minutes OT Frequency: 5 out of 7 days OT Duration/Estimated Length of Stay: 18-21 days SLP Intensity: Minumum of 1-2 x/day, 30 to 90 minutes SLP Frequency: 1 to 3 out of 7 days SLP Duration/Estimated Length of Stay: 10-14 days   Team Interventions: Nursing Interventions Patient/Family Education, Disease Management/Prevention, Medication Management, Cognitive Remediation/Compensation, Discharge Planning  PT interventions Ambulation/gait training, Cognitive remediation/compensation, Discharge planning,  DME/adaptive equipment instruction, Functional mobility training, Pain management, Splinting/orthotics, Psychosocial support, Therapeutic Activities, UE/LE Strength taining/ROM, Visual/perceptual remediation/compensation, Warden/ranger, Community reintegration, Disease management/prevention, Development worker, international aid stimulation, Neuromuscular re-education, Patient/family education, Museum/gallery curator, Therapeutic Exercise, UE/LE Coordination activities, Wheelchair propulsion/positioning  OT Interventions Warden/ranger, Cognitive remediation/compensation, Firefighter, Discharge planning, Disease mangement/prevention, Fish farm manager, Functional electrical stimulation, Functional mobility training, Neuromuscular re-education, Pain management, Patient/family education, Psychosocial support, Self Care/advanced ADL retraining, Skin care/wound managment, Splinting/orthotics, Therapeutic Activities, Therapeutic Exercise, UE/LE Strength taining/ROM, UE/LE Coordination activities, Visual/perceptual remediation/compensation, Wheelchair propulsion/positioning  SLP Interventions Cueing hierarchy, Functional tasks, Patient/family education, Therapeutic Activities, Speech/Language facilitation  TR Interventions    SW/CM Interventions Discharge Planning, Psychosocial Support, Patient/Family Education   Barriers to Discharge MD  Medical stability  Nursing Decreased caregiver support, Home environment access/layout 1 level with ramp entry  PT Decreased caregiver support, Home environment Best boy, Insurance for SNF coverage, Lack of/limited family support, Behavior    OT Decreased caregiver support Spouse can only provide supervision assist  SLP      SW Decreased caregiver support, Lack of/limited family support, Community education officer for SNF coverage     Team Discharge Planning: Destination: PT-Home ,OT- Home , SLP-Home Projected Follow-up: PT-Home health PT, OT-   Home health OT, SLP-24 hour supervision/assistance, Home Health SLP Projected Equipment Needs: PT-To be determined, OT- To be determined, SLP-None recommended by SLP Equipment Details: PT- , OT-  Patient/family involved in discharge planning: PT- Patient,  OT-Patient, SLP-Patient  MD ELOS: 10-14 Medical Rehab Prognosis:  Excellent Assessment: The patient has been admitted for CIR therapies with the diagnosis of ischemic infarction left internal capsule . The team will be addressing  functional mobility, strength, stamina, balance, safety, adaptive techniques and equipment, self-care, bowel and bladder mgt, patient and caregiver education. Goals have been set at supervision. Anticipated discharge destination is home.       See Team Conference Notes for weekly updates to the plan of care

## 2023-07-05 DIAGNOSIS — F0153 Vascular dementia, unspecified severity, with mood disturbance: Secondary | ICD-10-CM | POA: Diagnosis not present

## 2023-07-05 DIAGNOSIS — I639 Cerebral infarction, unspecified: Secondary | ICD-10-CM | POA: Diagnosis not present

## 2023-07-05 DIAGNOSIS — F322 Major depressive disorder, single episode, severe without psychotic features: Secondary | ICD-10-CM

## 2023-07-05 NOTE — Care Management (Signed)
Inpatient Rehabilitation Center Individual Statement of Services  Patient Name:  Michael Kidd  Date:  07/05/2023  Welcome to the Inpatient Rehabilitation Center.  Our goal is to provide you with an individualized program based on your diagnosis and situation, designed to meet your specific needs.  With this comprehensive rehabilitation program, you will be expected to participate in at least 3 hours of rehabilitation therapies Monday-Friday, with modified therapy programming on the weekends.  Your rehabilitation program will include the following services:  Physical Therapy (PT), Occupational Therapy (OT), Speech Therapy (ST), 24 hour per day rehabilitation nursing, Therapeutic Recreaction (TR), Psychology, Neuropsychology, Care Coordinator, Rehabilitation Medicine, Nutrition Services, Pharmacy Services, and Other  Weekly team conferences will be held on Tuesdays to discuss your progress.  Your Inpatient Rehabilitation Care Coordinator will talk with you frequently to get your input and to update you on team discussions.  Team conferences with you and your family in attendance may also be held.  Expected length of stay: 18-21 days  Overall anticipated outcome: Supervision  Depending on your progress and recovery, your program may change. Your Inpatient Rehabilitation Care Coordinator will coordinate services and will keep you informed of any changes. Your Inpatient Rehabilitation Care Coordinator's name and contact numbers are listed  below.  The following services may also be recommended but are not provided by the Inpatient Rehabilitation Center:  Driving Evaluations Home Health Rehabiltiation Services Outpatient Rehabilitation Services Vocational Rehabilitation   Arrangements will be made to provide these services after discharge if needed.  Arrangements include referral to agencies that provide these services.  Your insurance has been verified to be:  [employer]  Your primary  doctor is:  Barron Alvine  Pertinent information will be shared with your doctor and your insurance company.  Inpatient Rehabilitation Care Coordinator:  Susie Cassette [phone] or [phone]  Information discussed with and copy given to patient by: Gretchen Short, 07/05/2023, 9:10 AM

## 2023-07-05 NOTE — Progress Notes (Signed)
Physical Therapy Session Note  Patient Details  Name: Michael Kidd MRN: [id number] Date of Birth: November 09, 1930  Today's Date: 07/05/2023 PT Individual Time: 1500-1545 PT Individual Time Calculation (min): 45 min   Short Term Goals: Week 1:  PT Short Term Goal 1 (Week 1): Patient will complete bed/chair transfer with LRAD and MinA PT Short Term Goal 2 (Week 1): Patient will ambulate >32' with LRAD and MinA PT Short Term Goal 3 (Week 1): Patient will ascend/descend x4 steps with BHR and MinA  Skilled Therapeutic Interventions/Progress Updates:  Patient greeted supine in bed and initially not agreeable to PT treatment session due to fatigue, however agreeable to therapist returning in 10 minutes. At 1500, patient agreeable to PT treatment session and transitioned from supine to sitting EOB with MinA for righting trunk due to Rt-sided weakness. While sitting EOB, therapist donned shoes for time management. Patient stood from EOB with RW and MinA for initiating standing- Patient then performed stand pivot transfer to wheelchair with CGA for safety. Patient wheeled from his room to day room via wheelchair for time management and energy conservation.   Patient gait trained x75' and x150' (back to his room) with RW and MinA- VC for improved postural extension, stepping within the frame of the walker and increased Rt foot clearance/step length with good improvements noted, however unable to sustain without consistent VC.   Patient stood with RW and performed alternating foot taps to 4" step with CGA for safety- VC for increased Rt foot clearance and not dragging his foot with good improvements noted.   Patient returned to his room and transitioned from sitting EOB to supine with supv. Patient left supine in bed with bed alarm on, call bell within reach and all needs met.    Therapy Documentation Precautions:  Precautions Precautions: Fall Restrictions Weight Bearing Restrictions:  No  Pain: No/Denies pain.    Therapy/Group: Individual Therapy  Jeneal Vogl 07/05/2023, 3:30 PM

## 2023-07-05 NOTE — Consult Note (Signed)
Neuropsychological Consultation Comprehensive Inpatient Rehab   Patient:   Michael Kidd   DOB:   1930/07/12  MR Number:  [id number]  Location:  [facility] [address] CENTER A [address] [place] [address] Dept: [phone] Loc: [phone]           Date of Service:   07/05/2023  Start Time:   8 AM End Time:   9 AM  Provider/Observer:  Arley Phenix, Psy.D.       Clinical Neuropsychologist       Billing Code/Service: [phone]  Reason for Service:    Michael Kidd is a 89 year old male referred for neuropsychological consultation during his ongoing admission onto the comprehensive inpatient rehabilitation unit.  Patient has a past medical history including hypertension, BPH, aortic stenosis, CAD, hyperlipidemia and history of CVA in January 2024.  Patient has been followed previously by neurology (Dr. Pearlean Brownie) post stroke and had ongoing cognitive deficits during follow-up visits post stroke in January 2024.  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  Patient presented on 06/23/2023 with right-sided weakness and aphasia.  MRI showed small area of acute ischemia in left corpus stratum and evidence of past right parietal and occipital infarcts as well as old small vessel infarction in the basal ganglia, cerebellum and corona radiata.    Patient had significant impairments even prior to admission on previous mental status exam performed in April 2024 outpatient neurology and patient continues with moderate expressive aphasia but able to express himself at a basic level, indications of receptive language deficits.  It is difficult to get an assessment of full residual effects of parietal and occipital lobe infarcts previously.  I suspect that the majority of his cognitive deficits were previous to his most recent corpus stratum infarct and likely is a combination of previous and recent vascular events.  Patient has  been perseverative around the fact that he does not feel well but denies pain today.  While patient denies any intent to harm himself and denies suicidal ideation he clearly states how bad he feels and acknowledges depressive symptoms that are ongoing.  I suspect that the majority of these are due to cortical and subcortical cerebrovascular events combined with significant loss of function.  Patient is likely to make minimal gains and will need significant assistance going forward.  HPI for the current admission:    HPI: Michael Kidd is a 89 year old right-handed male with history of hypertension, BPH, aortic stenosis status post TAVR 2021, CAD with stenting, hyperlipidemia, tobacco use, history of CVA January 2024 maintained on Plavix with [facility]up [facility] also has mild dementia on Namenda; depression. Per chart review patient lives with spouse. 1 level at home. Wife assist with ADLs. Presented 06/23/2023 with right side side weakness and aphasia. CT/MRI showed small area of acute ischemia of the left corpus stratum. No hemorrhage or mass effect. Old right parietal and occipital infarcts as well as old small vessel infarction of the basal ganglia, cerebellum and corona radiata. CTA showed no large vessel occlusion or hemodynamically significant stenosis. Admission chemistries unremarkable except potassium 3.4, glucose 136, hemoglobin A1c 5.7. Echocardiogram with ejection fraction of 60 to 65% no wall motion abnormalities grade 1 diastolic dysfunction. Neurology follow-up currently maintained on low-dose aspirin with Brilinta 90 mg twice daily. Lovenox added for DVT prophylaxis. Tolerating a regular consistency diet. Therapy evaluations completed due to patient decreased functional mobility and right-sided weakness was admitted for  a comprehensive rehab program.   Medical History:   Past Medical History:  Diagnosis Date   Allergic rhinitis 03/29/2014   Aortic valve stenosis  05/23/2020   Arthralgia 07/15/2016   Arthritis    Benign prostatic hyperplasia 02/14/2014   Benign prostatic hyperplasia with urinary frequency 11/02/2018   Bilateral chronic knee pain    BPH (benign prostatic hyperplasia)    CAD (coronary artery disease)    s/p remote LAD PCI   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 PHQ-9 completed 02/26/14 PHQ-2 completed 02/14/14   Dizziness 03/12/2014   Dupuytren's contracture 07/15/2016   Dyspnea on exertion 08/15/2020   Dysthymia    Essential hypertension 03/12/2014   Flexor tendon bowstring 01/06/2016   Gastroesophageal reflux disease 08/18/2016   Hepatic steatosis 03/02/2014   History of CVA (cerebrovascular accident) 10/12/2019   HLD (hyperlipidemia) 02/14/2014   HTN (hypertension)    Late effect of cerebrovascular accident (CVA) 10/19/2019   Melanoma in situ of right upper extremity including shoulder (HCC) 08/07/2016   Formatting of this note might be different from the original. August 2017.  Dr. Chancy Milroy, [facility].   Murmur 05/23/2020   Myocardial infarction [facility])    Neuropathic pain, leg, bilateral 11/23/2017   Nonsustained ventricular tachycardia (HCC) 11/12/2020   Pure hypercholesterolemia 09/14/2018   Quadriceps weakness 09/01/2017   Renal cyst    Risk for falls 10/01/2016   S/P TAVR (transcatheter aortic valve replacement) 09/24/2020   Short of breath on exertion    per patient   Stroke [facility])    November   Symptomatic severe aortic stenosis with normal ejection fraction 08/15/2020         Patient Active Problem List   Diagnosis Date Noted   Current severe episode of major depressive disorder without psychotic features (HCC) 07/05/2023   Ischemic cerebrovascular accident (CVA) (HCC) 07/01/2023   Acute CVA (cerebrovascular accident)  (HCC) 06/23/2023   History of malignant melanoma 07/02/2021   Myocardial infarction (HCC)    Dysthymia    BPH (benign prostatic hyperplasia)    Bilateral chronic knee pain    Nonsustained ventricular tachycardia (HCC) 11/12/2020   HTN (hypertension)    Arthritis    S/P TAVR (transcatheter aortic valve replacement) 09/24/2020   Symptomatic severe aortic stenosis with normal ejection fraction 08/15/2020   Dyspnea on exertion 08/15/2020   Murmur 05/23/2020   Coronary artery disease with PCI and stenting many years ago 10/19/2019   Late effect of cerebrovascular accident (CVA) 10/19/2019   History of CVA (cerebrovascular accident) 10/12/2019   Pure hypercholesterolemia 09/14/2018   Neuropathic pain, leg, bilateral 11/23/2017   Chronic pain of both knees 09/01/2017   Quadriceps weakness 09/01/2017   Risk for falls 10/01/2016   Gastroesophageal reflux disease 08/18/2016   Melanoma in situ of right upper extremity including shoulder (HCC) 08/07/2016   Dupuytren's contracture 07/15/2016   Flexor tendon bowstring 01/06/2016   Allergic rhinitis 03/29/2014   Dizziness 03/12/2014   Renal cyst 03/02/2014   Hepatic steatosis 03/02/2014   Vascular dementia with depressed mood (HCC) 02/14/2014   Chronic ischemic heart disease 08/13/2010    Behavioral Observation/Mental Status:   EMITERIO NAU  presents as a 89 y.o.-year-old Right handed Caucasian Male who appeared his stated age. his dress was Appropriate and he was Well Groomed  and his manners were Appropriate, inappropriate to the situation.  his participation was indicative of Inattentive and Redirectable behaviors.  There were physical disabilities noted.  he displayed an appropriate level of cooperation and motivation.    Interactions:    Active Inattentive and Redirectable  Attention:   abnormal and attention span appeared shorter than expected for age  Memory:   abnormal; global memory impairment  noted  Visuo-spatial:   abnormal  Speech (Volume):  low  Speech:   non-fluent aphasia; slurred  Thought Process:  Circumstantial and Disorganized  Concrete, Circumstantial, and Preoccupied  Though Content:  Rumination; not suicidal and not homicidal  Orientation:   person and place  Judgment:   Poor  Planning:   Poor  Affect:    Depressed  Mood:    Dysphoric  Insight:   Shallow  Intelligence:   normal  Psychiatric History:  History of depressive symptoms over past two years.  Family Med/Psych History:  Family History  Problem Relation Age of Onset   Diabetes Mother    Stroke Father     Risk of Suicide/Violence: low while the patient has reiterated how "bad" he feels and describes depressive symptomatology and has at times verbalized not wanting to live the patient is a low risk for suicide attempt due to his severe deficits and denies today any current intention of harming himself.  Patient is significantly cognitively impaired and physically impaired due to multiple cerebrovascular events.    Impression/DX:   Late effect of cerebrovascular events with depressed mood and loss of function with recent further exacerbation with small corpus stratum stroke in the setting with previous parietal, occipital and subcortical strokes including basal ganglia stroke.  Patient is not going to be able to independently take care of himself and will need significant assistance going forward.          Electronically Signed   _______________________ Arley Phenix, Psy.D. Clinical Neuropsychologist

## 2023-07-05 NOTE — Progress Notes (Signed)
Patient ID: Michael Kidd, male   DOB: June 25, 1930, 89 y.o.   MRN: [id number]  SW informed pt wife on ELOS. She is hopeful that he will be able to stay here longer so she can work on her own health (as she has a doctor's appointment tomorrow). SW will follow-up with updates.   Cecile Sheerer, MSW, LCSWA Office: [phone] Cell: [phone] Fax: [phone]

## 2023-07-05 NOTE — [place] FL2 (Signed)
[place] MEDICAID FL2 LEVEL OF CARE FORM     IDENTIFICATION  Patient Name: Michael Kidd Birthdate: 1930/06/07 Sex: male Admission Date (Current Location): 07/01/2023  [facility] and [place] Number:  Producer, television/film/video and Address:  [facility] [place], [address], [place], [place] [address]      Provider Number: [id number]  Attending Physician Name and Address:  Angelina Sheriff, DO  Relative Name and Phone Number:  Beckam Lobello (wife) #[phone]    Current Level of Care: Hospital Recommended Level of Care: Skilled Nursing Facility Prior Approval Number:    Date Approved/Denied:   PASRR Number:    Discharge Plan: SNF    Current Diagnoses: Patient Active Problem List   Diagnosis Date Noted   Current severe episode of major depressive disorder without psychotic features (HCC) 07/05/2023   Ischemic cerebrovascular accident (CVA) (HCC) 07/01/2023   Acute CVA (cerebrovascular accident) (HCC) 06/23/2023   History of malignant melanoma 07/02/2021   Myocardial infarction [facility])    Dysthymia    BPH (benign prostatic hyperplasia)    Bilateral chronic knee pain    Nonsustained ventricular tachycardia (HCC) 11/12/2020   HTN (hypertension)    Arthritis    S/P TAVR (transcatheter aortic valve replacement) 09/24/2020   Symptomatic severe aortic stenosis with normal ejection fraction 08/15/2020   Dyspnea on exertion 08/15/2020   Murmur 05/23/2020   Coronary artery disease with PCI and stenting many years ago 10/19/2019   Late effect of cerebrovascular accident (CVA) 10/19/2019   History of CVA (cerebrovascular accident) 10/12/2019   Pure hypercholesterolemia 09/14/2018   Neuropathic pain, leg, bilateral 11/23/2017   Chronic pain of both knees 09/01/2017   Quadriceps weakness 09/01/2017   Risk for falls 10/01/2016   Gastroesophageal reflux disease 08/18/2016   Melanoma in situ of right upper extremity including shoulder (HCC) 08/07/2016   Dupuytren's  contracture 07/15/2016   Flexor tendon bowstring 01/06/2016   Allergic rhinitis 03/29/2014   Dizziness 03/12/2014   Renal cyst 03/02/2014   Hepatic steatosis 03/02/2014   Vascular dementia with depressed mood (HCC) 02/14/2014   Chronic ischemic heart disease 08/13/2010    Orientation RESPIRATION BLADDER Height & Weight     Self, Time, Place  Normal Incontinent Weight: 205 lb 7.5 oz (93.2 kg) Height:  5\' 11"  (180.3 cm)  BEHAVIORAL SYMPTOMS/MOOD NEUROLOGICAL BOWEL NUTRITION STATUS      Incontinent Diet (low sodium heart healthy)  AMBULATORY STATUS COMMUNICATION OF NEEDS Skin   Supervision Verbally Normal                       Personal Care Assistance Level of Assistance  Bathing, Feeding, Dressing Bathing Assistance: Limited assistance Feeding assistance: Limited assistance Dressing Assistance: Limited assistance     Functional Limitations Info  Sight, Hearing, Speech Sight Info: Adequate Hearing Info: Adequate Speech Info: Adequate    SPECIAL CARE FACTORS FREQUENCY  PT (By licensed PT), OT (By licensed OT), Speech therapy     PT Frequency: 5xs per week OT Frequency: 5xs per week     Speech Therapy Frequency: 5xs per week      Contractures      Additional Factors Info  Code Status, Allergies Code Status Info: Full Allergies Info: see d/c instructions           Current Medications (07/05/2023):  This is the current hospital active medication list Current Facility-Administered Medications  Medication Dose Route Frequency Provider Last Rate Last Admin   acetaminophen (TYLENOL) tablet  650 mg  650 mg Oral Q4H PRN AngiulliMcarthur Rossetti, PA-C       Or   acetaminophen (TYLENOL) 160 MG/5ML solution 650 mg  650 mg Per Tube Q4H PRN Angiulli, Mcarthur Rossetti, PA-C       Or   acetaminophen (TYLENOL) suppository 650 mg  650 mg Rectal Q4H PRN Angiulli, Mcarthur Rossetti, PA-C       amLODipine (NORVASC) tablet 5 mg  5 mg Oral Daily Charlton Amor, PA-C   5 mg at 07/05/23 1096    aspirin EC tablet 81 mg  81 mg Oral Daily Charlton Amor, PA-C   81 mg at 07/05/23 0454   atorvastatin (LIPITOR) tablet 80 mg  80 mg Oral QPM Charlton Amor, PA-C   80 mg at 07/04/23 1723   calcium carbonate (TUMS - dosed in mg elemental calcium) chewable tablet 200 mg of elemental calcium  1 tablet Oral TID WC Angiulli, Mcarthur Rossetti, PA-C   200 mg of elemental calcium at 07/05/23 0904   enoxaparin (LOVENOX) injection 40 mg  40 mg Subcutaneous Q24H AngiulliMcarthur Rossetti, PA-C   40 mg at 07/04/23 1349   FLUoxetine (PROZAC) capsule 10 mg  10 mg Oral Daily Elijah Birk C, DO   10 mg at 07/05/23 0904   lisinopril (ZESTRIL) tablet 10 mg  10 mg Oral Daily Fanny Dance, MD   10 mg at 07/05/23 0904   memantine (NAMENDA) tablet 10 mg  10 mg Oral BID Charlton Amor, PA-C   10 mg at 07/05/23 0981   polyethylene glycol (MIRALAX / GLYCOLAX) packet 17 g  17 g Oral Daily Elijah Birk C, DO   17 g at 07/05/23 1914   senna-docusate (Senokot-S) tablet 1 tablet  1 tablet Oral BID Elijah Birk C, DO   1 tablet at 07/05/23 7829   ticagrelor (BRILINTA) tablet 90 mg  90 mg Oral BID Charlton Amor, PA-C   90 mg at 07/05/23 5621   traZODone (DESYREL) tablet 50 mg  50 mg Oral QHS PRN Elijah Birk C, DO   50 mg at 07/04/23 2030     Discharge Medications: Please see discharge summary for a list of discharge medications.  Relevant Imaging Results:  Relevant Lab Results:   Additional Information HY#[id number]  Gretchen Short, LCSW

## 2023-07-05 NOTE — Progress Notes (Signed)
PROGRESS NOTE   Subjective/Complaints: Pt eating breakfast, asked if [facility] was a good hospital and then became tearful for several seconds    ROS: Denies CP, SOB, N/V/D  Objective:   No results found. Recent Labs    07/05/23 0711  WBC 6.3  HGB 12.9*  HCT 37.7*  PLT 214   No results for input(s): "NA", "K", "CL", "CO2", "GLUCOSE", "BUN", "CREATININE", "CALCIUM" in the last 72 hours.   Intake/Output Summary (Last 24 hours) at 07/05/2023 0813 Last data filed at 07/05/2023 0542 Gross per 24 hour  Intake 297 ml  Output 500 ml  Net -203 ml        Physical Exam: Vital Signs Blood pressure 123/77, pulse 62, temperature 98 F (36.7 C), resp. rate 16, height 5\' 11"  (1.803 m), weight 93.2 kg, SpO2 95%. Constitutional: No apparent distress. Appropriate appearance for age.  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 Functional deficits which require 3+ hours per day of interdisciplinary therapy in a comprehensive inpatient rehab setting. Physiatrist is providing close team supervision and 24 hour management of active medical problems listed below. Physiatrist and rehab team continue to assess barriers to discharge/monitor patient progress toward functional and medical goals  Care  Tool:  Bathing    Body parts bathed by patient: Right arm, Left arm, Chest, Abdomen, Front perineal area, Buttocks, Right upper leg, Right lower leg, Face, Left lower leg, Left upper leg         Bathing assist Assist Level: Moderate Assistance - Patient 50 - 74%     Upper Body Dressing/Undressing Upper body dressing   What is the patient wearing?: Pull over shirt    Upper body assist Assist Level: Minimal Assistance - Patient > 75%    Lower Body Dressing/Undressing Lower body dressing      What is the patient wearing?: Pants     Lower body assist Assist for lower body dressing: Maximal Assistance - Patient 25 - 49%     Toileting Toileting    Toileting assist Assist for toileting: Minimal Assistance - Patient > 75%     Transfers Chair/bed transfer  Transfers assist     Chair/bed transfer assist level: Moderate Assistance - Patient 50 - 74%     Locomotion Ambulation   Ambulation assist      Assist level: Minimal Assistance - Patient > 75% Assistive device: Walker-rolling Max distance: 180'   Walk 10 feet activity   Assist     Assist level: Minimal Assistance - Patient > 75% Assistive device: Walker-rolling   Walk  50 feet activity   Assist    Assist level: Minimal Assistance - Patient > 75% Assistive device: Walker-rolling    Walk 150 feet activity   Assist    Assist level: Minimal Assistance - Patient > 75% Assistive device: Walker-rolling    Walk 10 feet on uneven surface  activity   Assist     Assist level: Minimal Assistance - Patient > 75% Assistive device: Walker-rolling   Wheelchair     Assist Is the patient using a wheelchair?: Yes Type of Wheelchair: Manual    Wheelchair assist level: Dependent - Patient 0% Max wheelchair distance: 150'    Wheelchair 50 feet with 2 turns activity    Assist        Assist Level: Dependent - Patient 0%   Wheelchair 150 feet activity     Assist      Assist  Level: Dependent - Patient 0%   Blood pressure 123/77, pulse 62, temperature 98 F (36.7 C), resp. rate 16, height 5\' 11"  (1.803 m), weight 93.2 kg, SpO2 95%.  Medical Problem List and Plan: 1. Functional deficits secondary to ischemic infarction left internal capsule as well as history of prior left brain infarcts             -patient may  shower             -ELOS/Goals: 10-14 days -supervision             Admit to CIR  -7-26: Report per wife she fell at home and broke her wrist, is currently hospitalized.  No others caregiver support available at home.    -Ipoc completed 2.  Antithrombotics: -DVT/anticoagulation:  Pharmaceutical: Lovenox             -antiplatelet therapy: Aspirin 81 mg daily and Brilinta 90 mg twice daily x 4 weeks then aspirin alone-since failed Plavix 3. Pain Management: Tylenol as needed 4. Mood/Behavior/Sleep: Namenda 10 mg twice daily.  Trazodone 50 mg as needed for sleep.             -antipsychotic agents: Provide emotional support  -Endorsing some very mild passive SI to PT; start on fluoxetine 10 mg daily, scheduled for neuropsych eval Pt with some lability but affect is otherwise normal endorses good appetite  5. Neuropsych/cognition: This patient is not capable of making decisions on his own behalf.  6. Skin/Wound Care: Routine skin checks 7. Fluids/Electrolytes/Nutrition: Routine and helpful follow-up chemistries  -Mild AKI on admission documentation, encourage p.o. intakes, recheck Monday  -7/27 continue encourage p.o. fluid intake  -728 recheck tomorrow BMP ordered 8.  Hypertension.  Norvasc 5 mg daily, lisinopril 20 mg daily.  Monitor with increased mobility  7/28 decrease lisinopril to 10 mg daily    07/05/2023    5:45 AM 07/05/2023    5:42 AM 07/04/2023    7:40 PM  Vitals with BMI  Systolic 123 118 93  Diastolic 77 43 69  Pulse 62 59 80     9.  Hyperlipidemia.  Lipitor 10.  BPH/Urinary urgency/frequency/incontinence- suggest timed voiding.  Check PVR.  No documented retention  --7/28 remains continent, bladder scan yesterday 122 continue to monitor 11.  Aortic stenosis status post TAVR March 2021 as well as history of CAD with stenting.  Follow-up outpatient with Dr. Gypsy Balsam. 12. Hx of mild dementia- on Namenda    LOS: 4 days A FACE TO FACE EVALUATION WAS PERFORMED  Erick Colace 07/05/2023, 8:13 AM

## 2023-07-05 NOTE — Progress Notes (Signed)
Speech Language Pathology Daily Session Note  Patient Details  Name: Michael Kidd MRN: [id number] Date of Birth: 11-11-1930  Today's Date: 07/05/2023 SLP Individual Time: 1100-1158 SLP Individual Time Calculation (min): 58 min  Short Term Goals: Week 1: SLP Short Term Goal 1 (Week 1): Pt will utilize compensatory word finding strategies to repair communication breakdowns with >75% accuracy given min cues SLP Short Term Goal 2 (Week 1): Pt will follow 2-3 step auditory commands with >75% accuracy given min cues. SLP Short Term Goal 3 (Week 1): Pt will utilize external aids to answer orientation questions with >80% accuracy  Skilled Therapeutic Interventions:  Pt was seen in late am to address cognitive re- training and expressive language. Pt was alert and seated upright in WC upon SLP arrival. He reported "not feeling well" when asked however, he was unable to elaborate further. Pt presented with low vocal intensity requiring consistent cues across session to elevate voice. SLP challenging pt in orientation, repair of communication break downs and following multi step directions. Given external visual aid located within his room pt initially requiring mod A to locate month, year, day of week, and admission to hospital. As session progressed cues were able to be reduced to sup - min A for identification of temporal concepts. He was indep oriented to spatial concepts however, required min A cues including external visual aid to identify situation. In additional minutes of session, SLP challenged pt in use of compensatory word finding strategies. Given components of semantic feature analysis SLP challenged pt in describing objects in a structured task. Pt completed task with 100% acc with min A cues which were faded to sup to min A mostly for encouragement. Finally, pt was challenged in following multi step directions. He followed 2 step directions with 75% acc with min A. Pt left seated upright in WC  with call button within reach and chair alarm active. SLP to continue POC.   Pain Pain Assessment Pain Scale: 0-10 Pain Score: 0-No pain  Therapy/Group: Individual Therapy  Renaee Munda 07/05/2023, 12:41 PM

## 2023-07-05 NOTE — Progress Notes (Signed)
Occupational Therapy Session Note  Patient Details  Name: Michael Kidd MRN: [id number] Date of Birth: 1930/09/01  Today's Date: 07/05/2023 Session 1 OT Individual Time: 4010-2725 OT Individual Time Calculation (min): 70 min   Session 2 OT Individual Time: 1300-1345 OT Individual Time Calculation (min): 45 min    Short Term Goals: Week 1:  OT Short Term Goal 1 (Week 1): Patient will complete LB dressing with mod A OT Short Term Goal 2 (Week 1): Patient will recall hemi dressing techniqes with min questioning cues OT Short Term Goal 3 (Week 1): Patient will perform sit<.stand with consistent mod A and LRAD  Skilled Therapeutic Interventions/Progress Updates:  Session 1   Pt greeted semi-reclined in bed and agreeable to OT treatment session. Pt reported need to go to the bathroom. Pt needed mod A to elevate trunk into sitting EOB. Sit<>stand w/ min A, w/ RW, then min A to ambulate to the bathroom w/ RW. Pt voided bladder and successful large BM. Pt needed max A to stand from lower commode. Pt able to assist with washing peri-area, but needed OT assist for thoroughness. Pt with one lateral LOB to the R wile standing for peri-care requiring max A to correct. Pt ambulated back out of bathroom w/ RW and min A, but mod A when turning to sit on wc. Pt brought to the sink for UB bathing and dresssing. Pt needed increased time and multimodal cues to initiate doffing old shirt. Worked on initiating and sequencing within UB bathing tasks. Pt needed min A overall for UB BADLs. Pt reported missing his wife and wanted to call her. OT assist pt with dialing number to call his wife. Pt left seated in wc with alarm belt on, call bell in reach, and needs met, on the phone with wife.   Session 2 Pt greeted seated in wc and agreeable to OT treatment sesion. Pt brought to therapy gym in wc. Mod A stand-pivot to therapy mat without device. Addressed standing balance/endurance  and incorporated grip strengthening  with graded clothes pins placed around waist and reaching outside base of support to place on basketball net. This task also simulates clothing management. Pt needed mod  A to power up into standing. He then needed guided A from OT to reach with his R UE due to proximal shoulder weakness. Pt reported fatigue an being scared he was going to fall, so he returned to sitting. Pt took seated rest break, then put on 4 more clothes pins in similar fashion. Pt requested to shave today and returned to room for grooming task. Pt needed set-up A and increased time, but was able to shave with R hand. Pt ambulated to bed after shaving task and left semi-reclined in bed with bed alarm on, call bell in reach, and needs met.    Therapy Documentation Precautions:  Precautions Precautions: Fall Restrictions Weight Bearing Restrictions: No Pain: Pain Assessment Pain Scale: 0-10 Pain Score: 0-No pain   Therapy/Group: Individual Therapy  Mal Amabile 07/05/2023, 3:17 PM

## 2023-07-05 NOTE — Plan of Care (Signed)
  Problem: Consults Goal: RH STROKE PATIENT EDUCATION Description: See Patient Education module for education specifics  Outcome: Progressing   Problem: RH BOWEL ELIMINATION Goal: RH STG MANAGE BOWEL WITH ASSISTANCE Description: STG Manage Bowel with min Assistance. Outcome: Progressing   Problem: RH BLADDER ELIMINATION Goal: RH STG MANAGE BLADDER WITH ASSISTANCE Description: STG Manage Bladder With min Assistance Outcome: Progressing Goal: RH STG MANAGE BLADDER WITH MEDICATION WITH ASSISTANCE Description: STG Manage Bladder With Medication With min Assistance. Outcome: Progressing   Problem: RH SAFETY Goal: RH STG ADHERE TO SAFETY PRECAUTIONS W/ASSISTANCE/DEVICE Description: STG Adhere to Safety Precautions With cueing Assistance/Device. Outcome: Progressing Goal: RH STG DECREASED RISK OF FALL WITH ASSISTANCE Description: STG Decreased Risk of Fall With min Assistance. Outcome: Progressing   Problem: RH COGNITION-NURSING Goal: RH STG USES MEMORY AIDS/STRATEGIES W/ASSIST TO PROBLEM SOLVE Description: STG Uses Memory Aids/Strategies With cueing Assistance to Problem Solve. Outcome: Progressing   Problem: RH PAIN MANAGEMENT Goal: RH STG PAIN MANAGED AT OR BELOW PT'S PAIN GOAL Description: Pain will be managed 4 out of 10 on pain scale with PRN medications min assist Outcome: Progressing   Problem: RH KNOWLEDGE DEFICIT Goal: RH STG INCREASE KNOWLEDGE OF HYPERTENSION Description: Patient/caregiver will be able to manage medications and monitor blood pressure from nursing education, nursing handouts, and from other resources independently   Outcome: Progressing Goal: RH STG INCREASE KNOWLEGDE OF HYPERLIPIDEMIA Description: Patient/caregiver will be able to manage medications and improve HDL levels from nursing education, nursing handouts and other resources independently  Outcome: Progressing Goal: RH STG INCREASE KNOWLEDGE OF STROKE PROPHYLAXIS Description: Patient/caregiver  will be able to manage stroke medications (ASA, Brilinta) from nursing education and nursing handouts independently  Outcome: Progressing

## 2023-07-06 DIAGNOSIS — I951 Orthostatic hypotension: Secondary | ICD-10-CM

## 2023-07-06 MED ORDER — SODIUM CHLORIDE 0.9 % IV SOLN: INTRAVENOUS | Status: DC

## 2023-07-06 NOTE — Progress Notes (Addendum)
New skin tear to right lateral forearm. Notified MD/PA and care coordinator.    Tilden Dome, LPN

## 2023-07-06 NOTE — Progress Notes (Signed)
Speech Language Pathology Daily Session Note  Patient Details  Name: Michael Kidd MRN: [id number] Date of Birth: October 12, 1930  Today's Date: 07/06/2023 SLP Individual Time: 1300-1345 SLP Individual Time Calculation (min): 45 min  Short Term Goals: Week 1: SLP Short Term Goal 1 (Week 1): Pt will utilize compensatory word finding strategies to repair communication breakdowns with >75% accuracy given min cues SLP Short Term Goal 2 (Week 1): Pt will follow 2-3 step auditory commands with >75% accuracy given min cues. SLP Short Term Goal 3 (Week 1): Pt will utilize external aids to answer orientation questions with >80% accuracy  Skilled Therapeutic Interventions: Pt greeted at bedside. He was resting after his noon meal, but woke easily upon SLP arrival. SLP facilitated a variety of tx tasks targeting language and motor speech. He benefited from modA visual/verbal cues to complete responsive naming task re household and community items. He frequently responded "I don't know," but demonstrated a positive response to verbal encouragement and semantic cues. He was able to recall hospital name and state INDly but benefited from modA cues to utilize his calendar and ID remaining orientation information. Towards the end of the tx session, he was slightly perseverative re calling his wife and became fearful that something bad happened to her. However, again, SLP was able to redirect with functional word finding tasks and verbal encouragement to reduce pt anxiety. Pt left in his bed with alarm set and call light within reach. Recommend cont ST.   Pain  No pain reported.   Therapy/Group: Individual Therapy  [name] 07/06/2023, 1:32 PM

## 2023-07-06 NOTE — Progress Notes (Addendum)
Physical Therapy Session Note  Patient Details  Name: Michael Kidd MRN: [id number] Date of Birth: 01-07-1930  Today's Date: 07/06/2023 PT Individual Time: 1st Treatment Session: [phone]; 2nd Treatment Session: [id number] PT Individual Time Calculation (min): 75 min; 30 min  Short Term Goals: Week 1:  PT Short Term Goal 1 (Week 1): Patient will complete bed/chair transfer with LRAD and MinA PT Short Term Goal 2 (Week 1): Patient will ambulate >35' with LRAD and MinA PT Short Term Goal 3 (Week 1): Patient will ascend/descend x4 steps with BHR and MinA  Skilled Therapeutic Interventions/Progress Updates: 1st Treatment Session-  Patient greeted supine in bed and agreeable to PT treatment session. Patient transitioned to sitting EOB with use of bed rail and Supv for safety. While sitting EOB, therapist donned tennis shoes for time management. Patient stood with the RW and MinA with VC for proper hand placement- Patient then performed stand pivot transfer to wheelchair with CGA/MinA and VC for proper sequencing and RW management. Patient remained sitting in his wheelchair while he washed his face and brushed his dentures with set-up assistance. Patient wheeled to the day room for time management and energy conservation.   Upon arrival in rehab gym, patient reported "the room is going around." BP assessed in sitting: 112/70, HR 65 and in standing: 93/53, HR - Patient reporting feeling "terrible," but unable to truly explain what his symptoms feel like. Therapist donned knee-high ted hose to BLE, patient stood again and BP was assessed in standing: 81/54, HR 87 with patient reporting feeling symptomatic. When returned to a seated position, patient's BP is 102/67 and decrease in symptoms. RN, [name], and covering physician notified and aware. Covering physician present for morning rounds.   While seated, patient performed marches with 4# weight donned to each ankle and using therapist's hands as  external visual cues for a target. Patient completed x10 on each LE.   Patient requested to return to his room to use the bathroom. Patient gait trained to/from his wheelchair and bathroom with RW and CGA/MinA for improved stability. While turning toward the toilet, patient removed both hands from the walker leading to moderate LOB and requiring steadying assistance from therapist due to poor righting reactions. Patient with continent BM and required total assist for pericare. Patient left sitting upright in wheelchair in his room with posey belt on, call bell within reach and all needs met.   2nd Treatment Session-  Patient greeted supine in bed and agreeable to PT treatment session. Patient transitioned from supine to sitting EOB with SBA for safety and increased time required to complete secondary to Rt sided weakness- VC throughout for increased L lateral lean and pushing through Rt hand in order to right his trunk. While sitting EOB, patient became emotionally labile regarding his current condition and being unable to see his family/not having visitors. Therapist spent time being an active listener and consoling patient in order ot improve mood and affect. Called patient's spouse twice during treatment session, however she did not answer. Patient stood multiple times from raised EOB with RW and MinA for initiating standing with VC/TC for proper hand placement. Patient gait trained 2 x 25' to/from his bed and the doorway with RW and MinA- VC throughout for stepping within the frame of the walker and increased Rt step length/foot clearance with minimal improvements noted. Therapist facilitating Lt lateral weight shift throughout gait trials in order to improve Rt swing phase. Patient unable to ambulate further than 25' at this time  secondary to reports of "no feeling well," however unable to verbalize/describe symptoms and when asked multiple questions regarding his symptoms patient said no to each one.  Patient returned to supine in bed with bed alarm on, call bell within reach and all needs met.    Therapy Documentation Precautions:  Precautions Precautions: Fall Restrictions Weight Bearing Restrictions: No  Pain: No/Denies pain.    Therapy/Group: Individual Therapy  Michael Kidd 07/06/2023, 7:48 AM

## 2023-07-06 NOTE — Progress Notes (Signed)
Notified PA of drop in blood pressure during therapy session with teds on Sitting: 125/62 HR 66 Standing: 87/59 HR 73  . New order placed for abdominal binder.     Tilden Dome, LPN

## 2023-07-06 NOTE — Progress Notes (Addendum)
New skin tear to right forearm from picking at old bruises. Notified MD/PA and care coordinator. New orders placed.    Tilden Dome, LPN

## 2023-07-06 NOTE — Progress Notes (Signed)
Occupational Therapy Session Note  Patient Details  Name: Michael Kidd MRN: [id number] Date of Birth: 10-Mar-1930  Today's Date: 07/06/2023 OT Individual Time: 1103-1200 OT Individual Time Calculation (min): 57 min    Short Term Goals: Week 1:  OT Short Term Goal 1 (Week 1): Patient will complete LB dressing with mod A OT Short Term Goal 2 (Week 1): Patient will recall hemi dressing techniqes with min questioning cues OT Short Term Goal 3 (Week 1): Patient will perform sit<.stand with consistent mod A and LRAD  Skilled Therapeutic Interventions/Progress Updates:    OT entered room as nursing covering a skin tear that pt acquired by picking at his skin. Nursing addressed skin tear. Per discussion with PT, pt orthostatic in earlier PT session. Pt already with TED hose on. BP taken as noted below. Supine: n/a Sitting: 125/62 HR 66 Standing: 87/59 HR 73  Pt unable to explain how he was feeling but was shaking his head and acting like he felt off. Pt needed mod A to get to standing w./ RW. Mod A to pivot to bed w/ RW. Addressed UB there-ex with focus on R UE using 2 lb weighted bar.  Pt completed UB there-ex in sitting bicep curl 3 sets of 10. Pt returned to supine to take away gravity and increase ROM of R UE. Pt completed 3 stes of 10 chest press, triceps press, and R UE single arm punch. Pt continues to  be labile, tearful about missing his wife and stated "sometimes I wish I was never born, I am too much trouble." OT provided emotional support and encouragement. We then called wife to help pt calm down. Pt left semi-reclined in bed with bed alarm on, call bell in reach, and needs met.   Therapy Documentation Precautions:  Precautions Precautions: Fall Restrictions Weight Bearing Restrictions: No Pain: Pain Assessment Pain Scale: Faces Faces Pain Scale: No hurt   Therapy/Group: Individual Therapy  Mal Amabile 07/06/2023, 11:22 AM

## 2023-07-06 NOTE — Plan of Care (Signed)
  Problem: Consults Goal: RH STROKE PATIENT EDUCATION Description: See Patient Education module for education specifics  Outcome: Progressing   Problem: RH BOWEL ELIMINATION Goal: RH STG MANAGE BOWEL WITH ASSISTANCE Description: STG Manage Bowel with min Assistance. Outcome: Progressing   Problem: RH BLADDER ELIMINATION Goal: RH STG MANAGE BLADDER WITH ASSISTANCE Description: STG Manage Bladder With min Assistance Outcome: Progressing Goal: RH STG MANAGE BLADDER WITH MEDICATION WITH ASSISTANCE Description: STG Manage Bladder With Medication With min Assistance. Outcome: Progressing   Problem: RH SAFETY Goal: RH STG ADHERE TO SAFETY PRECAUTIONS W/ASSISTANCE/DEVICE Description: STG Adhere to Safety Precautions With cueing Assistance/Device. Outcome: Progressing Goal: RH STG DECREASED RISK OF FALL WITH ASSISTANCE Description: STG Decreased Risk of Fall With min Assistance. Outcome: Progressing   Problem: RH COGNITION-NURSING Goal: RH STG USES MEMORY AIDS/STRATEGIES W/ASSIST TO PROBLEM SOLVE Description: STG Uses Memory Aids/Strategies With cueing Assistance to Problem Solve. Outcome: Progressing   Problem: RH PAIN MANAGEMENT Goal: RH STG PAIN MANAGED AT OR BELOW PT'S PAIN GOAL Description: Pain will be managed 4 out of 10 on pain scale with PRN medications min assist Outcome: Progressing   Problem: RH KNOWLEDGE DEFICIT Goal: RH STG INCREASE KNOWLEDGE OF HYPERTENSION Description: Patient/caregiver will be able to manage medications and monitor blood pressure from nursing education, nursing handouts, and from other resources independently   Outcome: Progressing Goal: RH STG INCREASE KNOWLEGDE OF HYPERLIPIDEMIA Description: Patient/caregiver will be able to manage medications and improve HDL levels from nursing education, nursing handouts and other resources independently  Outcome: Progressing Goal: RH STG INCREASE KNOWLEDGE OF STROKE PROPHYLAXIS Description: Patient/caregiver  will be able to manage stroke medications (ASA, Brilinta) from nursing education and nursing handouts independently  Outcome: Progressing

## 2023-07-06 NOTE — Patient Care Conference (Signed)
Inpatient RehabilitationTeam Conference and Plan of Care Update Date: 07/06/2023   Time: 10:14 AM   Patient Name: Michael Kidd      Medical Record Number: [id number]  Date of Birth: 05-31-1930 Sex: Male         Room/Bed: 4W14C/4W14C-01 Payor Info: Payor: [employer] MEDICARE / Plan: [employer] MEDICARE HMO/PPO / Product Type: *No Product type* /    Admit Date/Time:  07/01/2023 12:06 PM  Primary Diagnosis:  Ischemic cerebrovascular accident (CVA) [facility])  Hospital Problems: Principal Problem:   Ischemic cerebrovascular accident (CVA) (HCC) Active Problems:   Vascular dementia with depressed mood (HCC)   Current severe episode of major depressive disorder without psychotic features [facility])    Expected Discharge Date: Expected Discharge Date: 07/21/23  Team Members Present: Physician leading conference: Dr. Faith Rogue Social Worker Present: Cecile Sheerer, LCSWA Nurse Present: Vedia Pereyra, RN PT Present: Amedeo Plenty, PT OT Present: Kearney Hard, OT SLP Present: Feliberto Gottron, SLP PPS Coordinator present : Fae Pippin, SLP     Current Status/Progress Goal Weekly Team Focus  Bowel/Bladder   Continent of B/B   Maintain Continence   Toilet patient qshift as needed    Swallow/Nutrition/ Hydration               ADL's   Min/mod A for BADL tasks, can be as little as min A for transfers, but occasionally mod A   Supervision   Self-care retraining, activity tolernace,balance, dc planning, barrier is family support    Mobility   Min/ModA for functional mobility with use of RW- Limited by carryover, impaired cognition, global deficits, etc.   Supv with LRAD- May need to be downgraded depending on progress  Barriers: Amount of family support as patient will need hands-on at discharge most likely. Continuing to work on functional/household mobility tasks in order to improve independence and overall safety for a safe discharge home.    Communication   Overall Min A for  auditory comprehension of basic information, use of word-finding strategies, and for use of speech intelligibility strategies.   Supervision   increase utilization of word-finding and speech intelligibility strategies in both formal and informal language tasks.    Safety/Cognition/ Behavioral Observations  Min A for use of external aids for orientation   Supervision   increase use of compenstory strategies for orientation    Pain   Patient has reported 0 on 0-10 pain score   maintain pain free   assess pain qshift and PRN    Skin   skin is intact other than minor bruising   maintain skin integrity  assess skin q shift and PRN      Discharge Planning:  D/c to home with wife, however, wife is not able to physically assist him with any of his care needs. Tentative SNF referral sent out in the event wife is unable to provide care.  SW will confirm there are no barriers to discharge.   Team Discussion: Ischemic cerebrovascular accident. Continent B/B. Encouraging fluids. Impulsive thoughts/behaviors. Redirectable. Balance very poor and requires heavy cueing. Needs repetition for carryover. Orthostatic. Barriers of family support.  Patient on target to meet rehab goals: yes, continues to progress towards goals with discharge date 07/21/23  *See Care Plan and progress notes for long and short-term goals.   Revisions to Treatment Plan:  IV fluids. Norvasc stopped. Monitor labs/VS  Teaching Needs: Medications, safety, self care, gait/transfer training, etc.   Current Barriers to Discharge: Lack of/limited family support  Possible Resolutions to Barriers:  Family education Order recommended DME if needed     Medical Summary Current Status: CVA, Orhtostatic hypotension,BPH, THN  Barriers to Discharge: Behavior/Mood;Hypotension;Medical stability  Barriers to Discharge Comments: CVA, Orhtostatic hypotension,BPH, THN Possible Resolutions to [employer] Focus: abd binder, monitor  labs, SSRI   Continued Need for Acute Rehabilitation Level of Care: The patient requires daily medical management by a physician with specialized training in physical medicine and rehabilitation for the following reasons: Direction of a multidisciplinary physical rehabilitation program to maximize functional independence : Yes Medical management of patient stability for increased activity during participation in an intensive rehabilitation regime.: Yes Analysis of laboratory values and/or radiology reports with any subsequent need for medication adjustment and/or medical intervention. : Yes   I attest that I was present, lead the team conference, and concur with the assessment and plan of the team.   Jearld Adjutant 07/06/2023, 2:19 PM

## 2023-07-06 NOTE — Progress Notes (Signed)
Patient ID: Michael Kidd, male   DOB: 10/23/1930, 89 y.o.   MRN: [id number]   1623- SW spoke with pt wife Talbert Forest to provide updates from team conference, and d/c date 8/14. SW discussed her decision to care for him at home vs SNF.  Reports she is not physically able to care for him as she now has cast. SW discussed if she is able to hire private aide, and/or has a church family to discuss if there are any additional supports. Intends to discuss with her pastor. She is unsure on the next steps. SW will continue to follow for discharge needs.   Cecile Sheerer, MSW, LCSWA Office: [phone] Cell: [phone] Fax: [phone]

## 2023-07-06 NOTE — Progress Notes (Signed)
PROGRESS NOTE   Subjective/Complaints: Working with therapy in the gym this morning.  Therapy noted orthostatic hypotension.  No additional concerns elicited.  Patient does not always able to relay symptoms related to his orthostatic hypotension.   ROS: Denies CP, SOB, N/V/D, abdominal pain  Objective:   No results found. Recent Labs    07/05/23 0711  WBC 6.3  HGB 12.9*  HCT 37.7*  PLT 214   Recent Labs    07/05/23 0711  NA 139  K 3.9  CL 110  CO2 21*  GLUCOSE 96  BUN 26*  CREATININE 1.25*  CALCIUM 8.4*     Intake/Output Summary (Last 24 hours) at 07/06/2023 1003 Last data filed at 07/05/2023 1759 Gross per 24 hour  Intake 306 ml  Output --  Net 306 ml        Physical Exam: Vital Signs Blood pressure 131/63, pulse (!) 52, temperature 98 F (36.7 C), temperature source Oral, resp. rate 18, height 5\' 11"  (1.803 m), weight 93.2 kg, SpO2 98%. Constitutional: No apparent distress. Appropriate appearance for age.  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 Functional deficits which require 3+ hours per day of interdisciplinary therapy in a comprehensive inpatient rehab setting. Physiatrist is providing close team supervision and 24 hour management of active  medical problems listed below. Physiatrist and rehab team continue to assess barriers to discharge/monitor patient progress toward functional and medical goals  Care Tool:  Bathing    Body parts bathed by patient: Right arm, Left arm, Chest, Abdomen, Front perineal area, Buttocks, Right upper leg, Right lower leg, Face, Left lower leg, Left upper leg         Bathing assist Assist Level: Moderate Assistance - Patient 50 - 74%     Upper Body Dressing/Undressing Upper body dressing   What is the patient wearing?: Pull over shirt    Upper body assist Assist Level: Minimal Assistance - Patient > 75%    Lower Body Dressing/Undressing Lower body dressing      What is the patient wearing?: Pants     Lower body assist Assist for lower body dressing: Maximal Assistance - Patient 25 - 49%     Toileting Toileting    Toileting assist Assist for toileting: Minimal Assistance - Patient > 75%     Transfers Chair/bed transfer  Transfers assist     Chair/bed transfer assist level: Moderate Assistance - Patient 50 - 74%     Locomotion Ambulation   Ambulation assist      Assist level: Minimal  Assistance - Patient > 75% Assistive device: Walker-rolling Max distance: 180'   Walk 10 feet activity   Assist     Assist level: Minimal Assistance - Patient > 75% Assistive device: Walker-rolling   Walk 50 feet activity   Assist    Assist level: Minimal Assistance - Patient > 75% Assistive device: Walker-rolling    Walk 150 feet activity   Assist    Assist level: Minimal Assistance - Patient > 75% Assistive device: Walker-rolling    Walk 10 feet on uneven surface  activity   Assist     Assist level: Minimal Assistance - Patient > 75% Assistive device: Walker-rolling   Wheelchair     Assist Is the patient using a wheelchair?: Yes Type of Wheelchair: Manual    Wheelchair assist level: Dependent - Patient 0% Max wheelchair distance: 150'     Wheelchair 50 feet with 2 turns activity    Assist        Assist Level: Dependent - Patient 0%   Wheelchair 150 feet activity     Assist      Assist Level: Dependent - Patient 0%   Blood pressure 131/63, pulse (!) 52, temperature 98 F (36.7 C), temperature source Oral, resp. rate 18, height 5\' 11"  (1.803 m), weight 93.2 kg, SpO2 98%.  Medical Problem List and Plan: 1. Functional deficits secondary to ischemic infarction left internal capsule as well as history of prior left brain infarcts             -patient may  shower             -ELOS/Goals: 10-14 days -supervision             Admit to CIR  -7-26: Report per wife she fell at home and broke her wrist, is currently hospitalized.  No others caregiver support available at home.    -Ipoc completed 2.  Antithrombotics: -DVT/anticoagulation:  Pharmaceutical: Lovenox             -antiplatelet therapy: Aspirin 81 mg daily and Brilinta 90 mg twice daily x 4 weeks then aspirin alone-since failed Plavix 3. Pain Management: Tylenol as needed 4. Mood/Behavior/Sleep: Namenda 10 mg twice daily.  Trazodone 50 mg as needed for sleep.             -antipsychotic agents: Provide emotional support  -Endorsing some very mild passive SI to PT; start on fluoxetine 10 mg daily, scheduled for neuropsych eval Pt with some lability but affect is otherwise normal endorses good appetite  5. Neuropsych/cognition: This patient is not capable of making decisions on his own behalf.  6. Skin/Wound Care: Routine skin checks 7. Fluids/Electrolytes/Nutrition: Routine and helpful follow-up chemistries  -Mild AKI on admission documentation, encourage p.o. intakes, recheck Monday  -7/27 continue encourage p.o. fluid intake  -728 recheck tomorrow BMP ordered  -7/30 creatinine/BUN up to 26/1.25, will start IVF normal saline 8.  Hypertension.  Norvasc 5 mg daily, lisinopril 20 mg daily.  Monitor with increased mobility  7/30 will stop Norvasc     07/06/2023    6:23 AM 07/05/2023    8:08 PM 07/05/2023    2:33 PM  Vitals with BMI  Systolic 131 129 409  Diastolic 63 54 47  Pulse 52 64 66     9.  Hyperlipidemia.  Lipitor 10.  BPH/Urinary urgency/frequency/incontinence- suggest timed voiding. Check PVR.  No documented retention  --7/28 remains continent, bladder scan yesterday 122 continue to monitor  -7/30 remains continent 11.  Aortic stenosis  status post TAVR March 2021 as well as history of CAD with stenting.  Follow-up outpatient with Dr. Gypsy Balsam. 12. Hx of mild dementia- on Namenda 13.  Orthostatic hypotension  -7/30 IVF normal saline 50 mL per hour and decrease Norvasc.  Abdominal binder and teds  LOS: 5 days A FACE TO FACE EVALUATION WAS PERFORMED  Fanny Dance 07/06/2023, 10:03 AM

## 2023-07-07 MED ORDER — SORBITOL 70 % SOLN
30.0000 mL | Freq: Every day | Status: DC | PRN
  Administered 2023-07-07 – 2023-07-12 (×3): 30 mL via ORAL
  Filled 2023-07-07 (×3): qty 30

## 2023-07-07 NOTE — Progress Notes (Signed)
Occupational Therapy Session Note  Patient Details  Name: Michael Kidd MRN: [id number] Date of Birth: 1930/07/19  Today's Date: 07/07/2023 OT Individual Time: 4098-1191 OT Individual Time Calculation (min): 77 min    Short Term Goals: Week 1:  OT Short Term Goal 1 (Week 1): Patient will complete LB dressing with mod A OT Short Term Goal 2 (Week 1): Patient will recall hemi dressing techniqes with min questioning cues OT Short Term Goal 3 (Week 1): Patient will perform sit<.stand with consistent mod A and LRAD  Skilled Therapeutic Interventions/Progress Updates:  Pt received resting in bed, requiring increased time to arouse and interact with therapist. OT session with focus on self-feeding and functional transfers. Pt agreeable to interventions with encouragement, demonstrating overall pleasant mood. Pt with generalized un-rated pain, stating "I hurt all over. . . I feel so bad I can't even eat. "OT offering intermediate rest breaks and positioning suggestions throughout session to address pain/fatigue and maximize participation/safety in session.   Pt comes to EOB with Mod A for trunk elevation, sitting EOB for ~15 minutes to eat his breakfast with close supervision for sitting balance. Pt's BP=117/74 at this point of session with TEDs/shoes dependently donned. Pt becomes tearful about wife, attempt to call with no answer. Pt performs stand-pivot from EOB>WC with Mod A + cuing for technique. Pt dependent for WC transport to day room for energy conservation.   In day room, pt performs x2 STS for BP assessment, details below and treatment team updated.  1st stance: BP=102/71 2nd stance: BP=101/58  Pt requires Mod A + UE support on high/low table to reach stance, pt benefitting from tactile cuing to reach upright posture and decrease forward flexion.   Pt returns to bed with stand-pivot, overall Mod A + UE support on bed rail.   Pt remained resting in bed with LPN present, all other  immediate needs met at end of session. Pt continues to be appropriate for skilled OT intervention to promote further functional independence.   Therapy Documentation Precautions:  Precautions Precautions: Fall Restrictions Weight Bearing Restrictions: No   Therapy/Group: Individual Therapy  Lou Cal, OTR/L, MSOT  07/07/2023, 6:17 AM

## 2023-07-07 NOTE — Progress Notes (Signed)
Speech Language Pathology Daily Session Note  Patient Details  Name: Michael Kidd MRN: [id number] Date of Birth: August 13, 1930  Today's Date: 07/07/2023 SLP Individual Time: 1400-1500 SLP Individual Time Calculation (min): 60 min  Short Term Goals: Week 1: SLP Short Term Goal 1 (Week 1): Pt will utilize compensatory word finding strategies to repair communication breakdowns with >75% accuracy given min cues SLP Short Term Goal 2 (Week 1): Pt will follow 2-3 step auditory commands with >75% accuracy given min cues. SLP Short Term Goal 3 (Week 1): Pt will utilize external aids to answer orientation questions with >80% accuracy  Skilled Therapeutic Interventions:   Pt greeted in his room as he was coming out of the bathroom w/ RNT. Direct hand off completed and SLP facilitated tx tasks targeting cognition and language. SLP assisted pt to the sink to wash his hands. He benefited from supervision cues for problem solving though adequate sequencing noted. SLP utilized errorless learning for orientation review prior to initiation of other tx tasks. During 4 step picture sequencing task, he benefited from modA cues for problem solving and organization. He INDly completed phrase completion task and required only spv cues for word finding during responsive naming task. In conversational tasks throughout tx session, he benefited from modA cues for word finding and phrase/sentence completion. He benefited from minA cues for attention and additional time during sorting task w/ field of 4 items. Pt required max visual/verbal cues to verbalize desired news station for SLP upon departure. He was left in his chair w/ his alarm set and call light within reach.    Pain  No pain reported.   Therapy/Group: Individual Therapy  [name] 07/07/2023, 7:46 AM

## 2023-07-07 NOTE — Progress Notes (Signed)
D. Angiulli, PA Notified of patient's blood pressure 101/58. Hold BP medication. POC in progress.    Tilden Dome, LPN

## 2023-07-07 NOTE — Progress Notes (Addendum)
PROGRESS NOTE   Subjective/Complaints: Pt in bed this AM. No acute events overnight. No new concerns this AM.    ROS: Denies CP, SOB, N/V/D, abdominal pain, HA, fever  Objective:   No results found. Recent Labs    07/05/23 0711  WBC 6.3  HGB 12.9*  HCT 37.7*  PLT 214   Recent Labs    07/05/23 0711  NA 139  K 3.9  CL 110  CO2 21*  GLUCOSE 96  BUN 26*  CREATININE 1.25*  CALCIUM 8.4*     Intake/Output Summary (Last 24 hours) at 07/07/2023 0845 Last data filed at 07/07/2023 0400 Gross per 24 hour  Intake 972.99 ml  Output 550 ml  Net 422.99 ml        Physical Exam: Vital Signs Blood pressure (!) 138/50, pulse (!) 55, temperature 97.8 F (36.6 C), resp. rate 18, height 5\' 11"  (1.803 m), weight 93.2 kg, SpO2 97%.   Constitutional: No apparent distress. Appropriate appearance for age.  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 Functional deficits which require 3+ hours per day of interdisciplinary therapy in a comprehensive inpatient rehab setting. Physiatrist is providing close team supervision and 24 hour management of active medical problems listed below. Physiatrist and rehab team continue to assess barriers to discharge/monitor patient progress toward functional and medical goals  Care Tool:  Bathing    Body parts bathed by  patient: Right arm, Left arm, Chest, Abdomen, Front perineal area, Buttocks, Right upper leg, Right lower leg, Face, Left lower leg, Left upper leg         Bathing assist Assist Level: Moderate Assistance - Patient 50 - 74%     Upper Body Dressing/Undressing Upper body dressing   What is the patient wearing?: Pull over shirt    Upper body assist Assist Level: Minimal Assistance - Patient > 75%    Lower Body Dressing/Undressing Lower body dressing      What is the patient wearing?: Pants     Lower body assist Assist for lower body dressing: Maximal Assistance - Patient 25 - 49%     Toileting Toileting    Toileting assist Assist for toileting: Minimal Assistance - Patient > 75%     Transfers Chair/bed transfer  Transfers assist     Chair/bed transfer assist level: Moderate Assistance - Patient 50 - 74%     Locomotion Ambulation   Ambulation assist      Assist level: Minimal Assistance - Patient > 75% Assistive device: Walker-rolling Max distance: 180'   Walk 10 feet activity   Assist     Assist level: Minimal Assistance - Patient > 75% Assistive  device: Walker-rolling   Walk 50 feet activity   Assist    Assist level: Minimal Assistance - Patient > 75% Assistive device: Walker-rolling    Walk 150 feet activity   Assist    Assist level: Minimal Assistance - Patient > 75% Assistive device: Walker-rolling    Walk 10 feet on uneven surface  activity   Assist     Assist level: Minimal Assistance - Patient > 75% Assistive device: Walker-rolling   Wheelchair     Assist Is the patient using a wheelchair?: Yes Type of Wheelchair: Manual    Wheelchair assist level: Dependent - Patient 0% Max wheelchair distance: 150'    Wheelchair 50 feet with 2 turns activity    Assist        Assist Level: Dependent - Patient 0%   Wheelchair 150 feet activity     Assist      Assist Level: Dependent - Patient 0%   Blood  pressure (!) 138/50, pulse (!) 55, temperature 97.8 F (36.6 C), resp. rate 18, height 5\' 11"  (1.803 m), weight 93.2 kg, SpO2 97%.  Medical Problem List and Plan: 1. Functional deficits secondary to ischemic infarction left internal capsule as well as history of prior left brain infarcts             -patient may  shower             -ELOS/Goals: 10-14 days -supervision             Admit to CIR  -7-26: Report per wife she fell at home and broke her wrist, is currently hospitalized.  No others caregiver support available at home.    -Estimated discharge 07/21/23 2.  Antithrombotics: -DVT/anticoagulation:  Pharmaceutical: Lovenox             -antiplatelet therapy: Aspirin 81 mg daily and Brilinta 90 mg twice daily x 4 weeks then aspirin alone-since failed Plavix 3. Pain Management: Tylenol as needed 4. Mood/Behavior/Sleep: Namenda 10 mg twice daily.  Trazodone 50 mg as needed for sleep.             -antipsychotic agents: Provide emotional support  -Endorsing some very mild passive SI to PT; start on fluoxetine 10 mg daily, scheduled for neuropsych eval Pt with some lability but affect is otherwise normal endorses good appetite  5. Neuropsych/cognition: This patient is not capable of making decisions on his own behalf.  6. Skin/Wound Care: Routine skin checks 7. Fluids/Electrolytes/Nutrition: Routine and helpful follow-up chemistries  -Mild AKI on admission documentation, encourage p.o. intakes, recheck Monday  -7/27 continue encourage p.o. fluid intake  -728 recheck tomorrow BMP ordered  -7/30 creatinine/BUN up to 26/1.25, will start IVF normal saline  -7/31 continue IVF for now, recheck labs tomorrow AM 8.  Hypertension.  Norvasc 5 mg daily, lisinopril 20 mg daily.  Monitor with increased mobility  7/30 will stop Norvasc  7/31 DC lisinopril 10mg     07/07/2023    5:24 AM 07/06/2023    9:14 PM 07/06/2023    7:45 PM  Vitals with BMI  Systolic 138 127 253  Diastolic 50 83 65  Pulse 55 69  78     9.  Hyperlipidemia.  Lipitor 10.  BPH/Urinary urgency/frequency/incontinence- suggest timed voiding. Check PVR.  No documented retention  --7/28 remains continent, bladder scan yesterday 122 continue to monitor  -7/31 remains continent, monitor as he is on IVF and will likely need to urinate for frequentyl 11.  Aortic stenosis status post TAVR March 2021  as well as history of CAD with stenting.  Follow-up outpatient with Dr. Gypsy Balsam. 12. Hx of mild dementia- on Namenda 13.  Orthostatic hypotension  -7/30 IVF normal saline 50 mL per hour and decrease Norvasc.  Abdominal binder and teds  -7/31 DC lisinopril, continue IVF 14. Constipation.   -Continue senokot and PEG, add sorbitol prn  LOS: 6 days A FACE TO FACE EVALUATION WAS PERFORMED  Fanny Dance 07/07/2023, 8:45 AM

## 2023-07-07 NOTE — Plan of Care (Signed)
  Problem: Consults Goal: RH STROKE PATIENT EDUCATION Description: See Patient Education module for education specifics  Outcome: Progressing   Problem: RH BOWEL ELIMINATION Goal: RH STG MANAGE BOWEL WITH ASSISTANCE Description: STG Manage Bowel with min Assistance. Outcome: Progressing   Problem: RH BLADDER ELIMINATION Goal: RH STG MANAGE BLADDER WITH ASSISTANCE Description: STG Manage Bladder With min Assistance Outcome: Progressing Goal: RH STG MANAGE BLADDER WITH MEDICATION WITH ASSISTANCE Description: STG Manage Bladder With Medication With min Assistance. Outcome: Progressing   Problem: RH SAFETY Goal: RH STG ADHERE TO SAFETY PRECAUTIONS W/ASSISTANCE/DEVICE Description: STG Adhere to Safety Precautions With cueing Assistance/Device. Outcome: Progressing Goal: RH STG DECREASED RISK OF FALL WITH ASSISTANCE Description: STG Decreased Risk of Fall With min Assistance. Outcome: Progressing   Problem: RH COGNITION-NURSING Goal: RH STG USES MEMORY AIDS/STRATEGIES W/ASSIST TO PROBLEM SOLVE Description: STG Uses Memory Aids/Strategies With cueing Assistance to Problem Solve. Outcome: Progressing   Problem: RH PAIN MANAGEMENT Goal: RH STG PAIN MANAGED AT OR BELOW PT'S PAIN GOAL Description: Pain will be managed 4 out of 10 on pain scale with PRN medications min assist Outcome: Progressing   Problem: RH KNOWLEDGE DEFICIT Goal: RH STG INCREASE KNOWLEDGE OF HYPERTENSION Description: Patient/caregiver will be able to manage medications and monitor blood pressure from nursing education, nursing handouts, and from other resources independently   Outcome: Progressing Goal: RH STG INCREASE KNOWLEGDE OF HYPERLIPIDEMIA Description: Patient/caregiver will be able to manage medications and improve HDL levels from nursing education, nursing handouts and other resources independently  Outcome: Progressing Goal: RH STG INCREASE KNOWLEDGE OF STROKE PROPHYLAXIS Description: Patient/caregiver  will be able to manage stroke medications (ASA, Brilinta) from nursing education and nursing handouts independently  Outcome: Progressing

## 2023-07-07 NOTE — Progress Notes (Signed)
Orthopedic Tech Progress Note Patient Details:  HOBBS PAROLA 10-02-1930 [id number]  RN called requesting a new abdominal binder. Dropped off this morning   Ortho Devices Type of Ortho Device: Abdominal binder Ortho Device/Splint Location: STOMACH Ortho Device/Splint Interventions: Ordered   Post Interventions Patient Tolerated: Well Instructions Provided: Care of device  Donald Pore 07/07/2023, 3:07 PM

## 2023-07-07 NOTE — Progress Notes (Signed)
Physical Therapy Session Note  Patient Details  Name: FAHED GAFFKE MRN: [id number] Date of Birth: 11-Jan-1930  Today's Date: 07/07/2023 PT Individual Time: 1st Treatment Session: 0102-7253; 2nd Treatment Session: 6644-0347 PT Individual Time Calculation (min): 46 min; 30 min  Short Term Goals: Week 1:  PT Short Term Goal 1 (Week 1): Patient will complete bed/chair transfer with LRAD and MinA PT Short Term Goal 2 (Week 1): Patient will ambulate >67' with LRAD and MinA PT Short Term Goal 3 (Week 1): Patient will ascend/descend x4 steps with BHR and MinA  Skilled Therapeutic Interventions/Progress Updates:  1st Treatment Session- Patient greeted supine in bed and agreeable to PT treatment session. Patient transitioned to sitting EOB with CGA and increased time with multimodal cues for improved sequencing in order to right his trunk. While sitting EOB, IV fluid bag was changed and vitals were assessed secondary to patient being orthostatic- BP sitting EOB: 142/54, HR 60  BP standing EOB: 119/68, HR 75  RN notified and aware of BP readings- Performed with thigh-high ted hose and abdominal binder.   Patient performed various sit/stands with RW and MinA for initiating standing. VC for proper hand placement with good carryover noted with increased repetition and prompting.   Patient gait trained x120' and ~130' back to his room with RW and MinA- VC for improved postural extension, stepping within the frame of the walker and increased Rt foot clearance/step length with good improvements noted with consistent cues, however unable to sustain as he fatigues or without cueing. Therapist facilitating Lt lateral weight shift in order to improve Rt swing phase of gait and overall stability.   Patient returned to his room and left sitting upright in his wheelchair with posey belt on, call bell within reach, tray table in front and all needs met.   2nd Treatment Session- Patient greeted sitting upright  in his wheelchair in his room and transitioned from ST care and agreeable to PT treatment session. Patient wheeled to/from his room and day room via wheelchair for time management and energy conservation.   Patient stood with RW while performing-  -Rt foot taps to 4" step with 2.5# weight donned to ankle, x10 total  -Lt foot taps to 4# step, x10 with focus on R TKE and improved midline posturing Activity performed in order to improve RNMR for improved R step length and foot clearance throughout gait trials and all functional mobility tasks. Therapist provided CGA/MinA with multimodal cues for improved posturing and RLE management.   Patient then performed seated Rt hip flexion to target with 2.5# weight donned to his ankle, 2 x 10 in order to increase overall strength for improved functional mobility.   Patient gait trained back to his room with RW and MinA- Therapist managing IV pole throughout gait trial. 2.5# weight remained on patient's ankle throughout gait trial for improved feedback/external cues regarding step length and foot clearance. Consistent VC for improved Rt foot clearance/step length and multiple breaks in order to improve gait mechanics.   Patient left supine in bed with bed alarm on, call bell within reach and all needs met.    Therapy Documentation Precautions:  Precautions Precautions: Fall Restrictions Weight Bearing Restrictions: No  Pain: 1st Treatment Session- Patient denies pain.  2nd Treatment Session- No/Denies pain.    Therapy/Group: Individual Therapy  Raynee Mccasland 07/07/2023, 8:57 AM

## 2023-07-08 DIAGNOSIS — K5900 Constipation, unspecified: Secondary | ICD-10-CM

## 2023-07-08 NOTE — Progress Notes (Signed)
Occupational Therapy Weekly Progress Note  Patient Details  Name: Michael Kidd MRN: [id number] Date of Birth: 07-07-1930  Beginning of progress report period: July 02, 2023 End of progress report period: July 08, 2023  Today's Date: 07/08/2023 OT Individual Time: 3151-7616 OT Individual Time Calculation (min): 70 min    Patient has met 3 of 3 short term goals.  Patient is making slow but steady progress towards OT goals. Patient is at an overall min to moderate assistance for transfers and BADL tasks pending fatigue level. He can still need as much as mod A to stand from lower surfaces. Pt has lateral LOB's at times requiring mod A to correct, but can also be as little as min A. Pt has also been limited by some orthostatic hypotension that we are implementing TEDS, abdominal binder, and increased fluids. Continue current POC.   Patient continues to demonstrate the following deficits: muscle weakness, decreased cardiorespiratoy endurance, decreased coordination and decreased motor planning, decreased attention, decreased awareness, decreased problem solving, decreased safety awareness, and decreased memory, and decreased sitting balance, decreased standing balance, decreased postural control, hemiplegia, and decreased balance strategies and therefore will continue to benefit from skilled OT intervention to enhance overall performance with BADL and Reduce care partner burden.  Patient progressing toward long term goals..  Continue plan of care.  OT Short Term Goals Week 1:  OT Short Term Goal 1 (Week 1): Patient will complete LB dressing with mod A OT Short Term Goal 1 - Progress (Week 1): Met OT Short Term Goal 2 (Week 1): Patient will recall hemi dressing techniqes with min questioning cues OT Short Term Goal 2 - Progress (Week 1): Met OT Short Term Goal 3 (Week 1): Patient will perform sit<.stand with consistent mod A and LRAD OT Short Term Goal 3 - Progress (Week 1): Met Week 2:  OT  Short Term Goal 1 (Week 2): Patient will complete toilet transfer with min A and LRAD OT Short Term Goal 2 (Week 2): Patient will complete 2/3 toileting tasks with min A OT Short Term Goal 3 (Week 2): Patient will maintain standing balance with min A for 4 minutes in preparation for BADL tasks  Skilled Therapeutic Interventions/Progress Updates:    Pt greeted seated in wc with abdominal binder and TEDs on. Pt already dressed with IV running. Pt declined BADL tasks, but reported need to go to the bathroom. Stand-pivot to toilet with heavy use of grab bars and mod A to stand, then min A to pivot. Pt needed max A for clothing management, but had successful BM and voided bladder. Pt attempted peri-care using hip hike while seated on commode, but then needed OT assist for thoroughness. Mod A to stand from lower toilet, then min A to pivot back to wc. Pt brought to the sink and pt completed hand washing and grooming tasks with set-up A. Pt brought to therapy gym in wc and ambulated 10 feet w/ RW and min A, but then Mod A when turning to sit on therapy mat. Worked on R hand [facility] with small peg board activity. Graded task by using tweezers to then remove Pegs. Pt needed moderate cues initially to understand the concept of copying pattern from a picture onto a peg board. Pt returned to room and pivoted back to bed with min A. Pt left semi-reclined in bed with bed alarm on, call bell in reach, and needs met.   Therapy Documentation Precautions:  Precautions Precautions: Fall Restrictions Weight Bearing Restrictions: No  Pain:   Pt reported generalized pain. Rest and repositioned for comfort.  Other Treatments:     Therapy/Group: Individual Therapy  Mal Amabile 07/08/2023, 10:06 AM

## 2023-07-08 NOTE — Progress Notes (Signed)
Physical Therapy Session Note  Patient Details  Name: Michael Kidd MRN: [id number] Date of Birth: August 28, 1930  Today's Date: 07/08/2023 PT Individual Time: 0807-0906 PT Individual Time Calculation (min): 59 min   Short Term Goals: Week 1:  PT Short Term Goal 1 (Week 1): Patient will complete bed/chair transfer with LRAD and MinA PT Short Term Goal 2 (Week 1): Patient will ambulate >6' with LRAD and MinA PT Short Term Goal 3 (Week 1): Patient will ascend/descend x4 steps with BHR and MinA  Skilled Therapeutic Interventions/Progress Updates:     Pt presents in room in bed, reporting feeling "bad all over" and "terrible" however unable to provide specific symptoms. Vitals assessed throughout session and TED hose/abdominal binder donned, pt vitals WNL. Session focused on participation with self care tasks, transfer training, education and reorientation and gait training.  Pt emotional throughout session, very sorrowful about feeling poorly and decreased confidence in his abilities to participate with PT, therapist requires increased time and therapeutic use of self to encourage pt participation. Therapist dons TED hose in supine total assist for time management, max assist for threading pants in supine. Pt completes bed mobility with min assist, max verbal cues for sequencing with pt attempting to reach for therapist for assistance. Pt requires close supervision/CGA for sitting balance EOB with pt demonstrating R lateral lean. Pt requests to complete periarea hygiene, agreeable to trial in standing. Pt completes sit to stand to RW with min/mod assist, verbal cues for hand placement. Pt remains standing and requests assist for posterior pericare, able to complete anterior pericare with mod verbal cues for encouragement with CGA for postural stability with unilateral UE support. Pt completes ambulatory transfer with min assist, demos decreased RLE foot clearance, verbal cues for RW mgmt and  sequencing.  Pt transported to dayroom dependently for time management and energy conservation. Pt requires increased time and encouragement to trial gait training. Pt ambulates 2x35' with RW min assist, poor foot clearance RLE with verbal cues to correct however pt unable to implement. Pt requires increased time for rest break between gait trials in which pt required frequent words of encouragement. MD present during session and notified of pt reports of feeling poorly however pt unable to specify.  Pt returns to room and remains seated in [facility] with all needs within reach, cal light in place and chair alarm donned and activated at end of session.   Vitals: In supine: 133/66, HR 67 In sitting: 124/67, HR 67 Following standing: 140/54, HR 64 (abdominal binder donned) Following ambulation: 120/80, HR 74  Therapy Documentation Precautions:  Precautions Precautions: Fall Restrictions Weight Bearing Restrictions: No    Therapy/Group: Individual Therapy   Edwin Cap PT, DPT 07/08/2023, 1:01 PM

## 2023-07-08 NOTE — Progress Notes (Signed)
Physical Therapy Session Note  Patient Details  Name: Michael Kidd MRN: [id number] Date of Birth: 18-Jun-1930  Today's Date: 07/08/2023 PT Individual Time: 1430-1520 PT Individual Time Calculation (min): 50 min   Short Term Goals: Week 1:  PT Short Term Goal 1 (Week 1): Patient will complete bed/chair transfer with LRAD and MinA PT Short Term Goal 2 (Week 1): Patient will ambulate >31' with LRAD and MinA PT Short Term Goal 3 (Week 1): Patient will ascend/descend x4 steps with BHR and MinA  Skilled Therapeutic Interventions/Progress Updates: Pt presents R sidelying in bed and agreeable to therapy.  BP measured at 136/54 w/ THT on.  Pt transferred sidelying to sit w/ min A.  Pt sat EOB for total A for shoes.  BP measured at 131/68 w/ c/o "don't feel right".  Abd binder applied.  Pt transferred sit to stand w/ min A and BP measured at 105/59 and min symptoms.  Pt amb to w/c x 5' w/ RW and min A, cueing for walker management.  Pt wheeled to main gym.  Pt amb x 120' w/ RW and min A, verbal cues for cadence and toe slap.  Pt states "don't feel good" but unable to clarify, BP measured at 136/71.  Pt returned to room and requested need for BR.  Pt amb into BR w/ min A, decreased control w/ stand to sit at toilet.  Pt states positive for bladder but not noted.  Pt returned to bed w/ min A and sit to supine w/ CGA.  Bed alarm on and all needs in reach.  Nursing notified of BP measurements.  Missed time of 10'.     Therapy Documentation Precautions:  Precautions Precautions: Fall Restrictions Weight Bearing Restrictions: No General: PT Amount of Missed Time (min): 10 Minutes PT Missed Treatment Reason: Patient unwilling to participate (not feeling well.) Vital Signs: Therapy Vitals Temp: 97.8 F (36.6 C) Pulse Rate: 73 Resp: 18 BP: (!) 150/75 Patient Position (if appropriate): Sitting Oxygen Therapy SpO2: 98 % O2 Device: Room Air Pain:0/10      Therapy/Group: Individual  Therapy  [name] 07/08/2023, 4:13 PM

## 2023-07-08 NOTE — Progress Notes (Signed)
PROGRESS NOTE   Subjective/Complaints: Mr. Gonnering reports that he does not feel good this morning, however he is unable to specify anything specific that is bothering him.   ROS: Patient denies fever, rash, sore throat, blurred vision, dizziness, nausea, vomiting, diarrhea, cough, shortness of breath or chest pain, joint or back/neck pain, headache, or mood change.   Objective:   No results found. No results for input(s): "WBC", "HGB", "HCT", "PLT" in the last 72 hours.  Recent Labs    07/08/23 0648  NA 138  K 4.3  CL 110  CO2 21*  GLUCOSE 92  BUN 18  CREATININE 1.07  CALCIUM 8.1*     Intake/Output Summary (Last 24 hours) at 07/08/2023 1053 Last data filed at 07/08/2023 0900 Gross per 24 hour  Intake 780 ml  Output 675 ml  Net 105 ml        Physical Exam: Vital Signs Blood pressure (!) 124/92, pulse (!) 55, temperature 98 F (36.7 C), resp. rate 16, height 5\' 11"  (1.803 m), weight 93.2 kg, SpO2 95%.   Constitutional: No apparent distress. Appropriate appearance for age.  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 Functional deficits which require 3+ hours per day of interdisciplinary therapy in a comprehensive inpatient rehab setting. Physiatrist is providing close team supervision and 24 hour management of active medical problems  listed below. Physiatrist and rehab team continue to assess barriers to discharge/monitor patient progress toward functional and medical goals  Care Tool:  Bathing    Body parts bathed by patient: Right arm, Left arm, Chest, Abdomen, Front perineal area, Buttocks, Right upper leg, Right lower leg, Face, Left lower leg, Left upper leg         Bathing assist Assist Level: Moderate Assistance - Patient 50 - 74%     Upper Body Dressing/Undressing Upper body dressing   What is the patient wearing?: Pull over shirt    Upper body assist Assist Level: Minimal Assistance - Patient > 75%    Lower Body Dressing/Undressing Lower body dressing      What is the patient wearing?: Pants     Lower body assist Assist for lower body dressing: Maximal Assistance - Patient 25 - 49%     Toileting Toileting    Toileting assist Assist for toileting: Minimal Assistance - Patient > 75%     Transfers Chair/bed transfer  Transfers assist     Chair/bed transfer assist level: Moderate Assistance - Patient 50 - 74%     Locomotion Ambulation   Ambulation assist      Assist level: Minimal Assistance - Patient > 75% Assistive device: Walker-rolling Max distance: 180'  Walk 10 feet activity   Assist     Assist level: Minimal Assistance - Patient > 75% Assistive device: Walker-rolling   Walk 50 feet activity   Assist    Assist level: Minimal Assistance - Patient > 75% Assistive device: Walker-rolling    Walk 150 feet activity   Assist    Assist level: Minimal Assistance - Patient > 75% Assistive device: Walker-rolling    Walk 10 feet on uneven surface  activity   Assist     Assist level: Minimal Assistance - Patient > 75% Assistive device: Walker-rolling   Wheelchair     Assist Is the patient using a wheelchair?: Yes Type of Wheelchair: Manual    Wheelchair assist level: Dependent - Patient 0% Max wheelchair distance: 150'    Wheelchair 50 feet  with 2 turns activity    Assist        Assist Level: Dependent - Patient 0%   Wheelchair 150 feet activity     Assist      Assist Level: Dependent - Patient 0%   Blood pressure (!) 124/92, pulse (!) 55, temperature 98 F (36.7 C), resp. rate 16, height 5\' 11"  (1.803 m), weight 93.2 kg, SpO2 95%.  Medical Problem List and Plan: 1. Functional deficits secondary to ischemic infarction left internal capsule as well as history of prior left brain infarcts             -patient may  shower             -ELOS/Goals: 10-14 days -supervision             Admit to CIR  -7-26: Report per wife she fell at home and broke her wrist, is currently hospitalized.  No others caregiver support available at home.    -Estimated discharge 07/21/23 2.  Antithrombotics: -DVT/anticoagulation:  Pharmaceutical: Lovenox             -antiplatelet therapy: Aspirin 81 mg daily and Brilinta 90 mg twice daily x 4 weeks then aspirin alone-since failed Plavix 3. Pain Management: Tylenol as needed 4. Mood/Behavior/Sleep: Namenda 10 mg twice daily.  Trazodone 50 mg as needed for sleep.             -antipsychotic agents: Provide emotional support  -Endorsing some very mild passive SI to PT; start on fluoxetine 10 mg daily, scheduled for neuropsych eval Pt with some lability but affect is otherwise normal endorses good appetite  5. Neuropsych/cognition: This patient is not capable of making decisions on his own behalf.  6. Skin/Wound Care: Routine skin checks 7. Fluids/Electrolytes/Nutrition: Routine and helpful follow-up chemistries  -Mild AKI on admission documentation, encourage p.o. intakes, recheck Monday  -7/27 continue encourage p.o. fluid intake  -728 recheck tomorrow BMP ordered  -7/30 creatinine/BUN up to 26/1.25, will start IVF normal saline  -8/1 discontinue IVF, creatinine and BUN improved 8.  Hypertension.  Norvasc 5 mg daily, lisinopril 20 mg daily.  Monitor with increased mobility  7/30 will  stop Norvasc  7/31 DC lisinopril 10mg   8/1 BP stable and appears orthostatic hypotension has improved, continue to monitor    07/08/2023    5:17 AM 07/07/2023    8:18 PM 07/07/2023    2:35 PM  Vitals with BMI  Systolic 124 135 629  Diastolic 92 65 66  Pulse 55 76 65     9.  Hyperlipidemia.  Lipitor 10.  BPH/Urinary urgency/frequency/incontinence- suggest timed voiding. Check PVR.  No documented retention  --7/28 remains continent, bladder scan yesterday  122 continue to monitor  -7/31 remains continent, monitor as he is on IVF and will likely need to urinate for frequentyl 11.  Aortic stenosis status post TAVR March 2021 as well as history of CAD with stenting.  Follow-up outpatient with Dr. Gypsy Balsam. 12. Hx of mild dementia- on Namenda 13.  Orthostatic hypotension  -7/30 IVF normal saline 50 mL per hour and decrease Norvasc.  Abdominal binder and teds  -7/31 DC lisinopril, continue IVF  -8/1 DC IVF 14. Constipation.   -Continue senokot and PEG, add sorbitol prn  -8/1 last BM today, improved continue to monitor  LOS: 7 days A FACE TO FACE EVALUATION WAS PERFORMED  Fanny Dance 07/08/2023, 10:53 AM

## 2023-07-08 NOTE — Plan of Care (Signed)
  Problem: Consults Goal: RH STROKE PATIENT EDUCATION Description: See Patient Education module for education specifics  Outcome: Progressing   Problem: RH BOWEL ELIMINATION Goal: RH STG MANAGE BOWEL WITH ASSISTANCE Description: STG Manage Bowel with min Assistance. Outcome: Progressing   Problem: RH BLADDER ELIMINATION Goal: RH STG MANAGE BLADDER WITH ASSISTANCE Description: STG Manage Bladder With min Assistance Outcome: Progressing Goal: RH STG MANAGE BLADDER WITH MEDICATION WITH ASSISTANCE Description: STG Manage Bladder With Medication With min Assistance. Outcome: Progressing   Problem: RH SAFETY Goal: RH STG ADHERE TO SAFETY PRECAUTIONS W/ASSISTANCE/DEVICE Description: STG Adhere to Safety Precautions With cueing Assistance/Device. Outcome: Progressing Goal: RH STG DECREASED RISK OF FALL WITH ASSISTANCE Description: STG Decreased Risk of Fall With min Assistance. Outcome: Progressing   Problem: RH COGNITION-NURSING Goal: RH STG USES MEMORY AIDS/STRATEGIES W/ASSIST TO PROBLEM SOLVE Description: STG Uses Memory Aids/Strategies With cueing Assistance to Problem Solve. Outcome: Progressing   Problem: RH PAIN MANAGEMENT Goal: RH STG PAIN MANAGED AT OR BELOW PT'S PAIN GOAL Description: Pain will be managed 4 out of 10 on pain scale with PRN medications min assist Outcome: Progressing   Problem: RH KNOWLEDGE DEFICIT Goal: RH STG INCREASE KNOWLEDGE OF HYPERTENSION Description: Patient/caregiver will be able to manage medications and monitor blood pressure from nursing education, nursing handouts, and from other resources independently   Outcome: Progressing Goal: RH STG INCREASE KNOWLEGDE OF HYPERLIPIDEMIA Description: Patient/caregiver will be able to manage medications and improve HDL levels from nursing education, nursing handouts and other resources independently  Outcome: Progressing Goal: RH STG INCREASE KNOWLEDGE OF STROKE PROPHYLAXIS Description: Patient/caregiver  will be able to manage stroke medications (ASA, Brilinta) from nursing education and nursing handouts independently  Outcome: Progressing

## 2023-07-09 MED ORDER — DICLOFENAC SODIUM 1 % EX GEL
2.0000 g | Freq: Four times a day (QID) | CUTANEOUS | Status: DC
  Administered 2023-07-09 – 2023-07-19 (×29): 2 g via TOPICAL
  Filled 2023-07-09: qty 100

## 2023-07-09 NOTE — Progress Notes (Signed)
PROGRESS NOTE   Subjective/Complaints: Lying in bed this AM. No new concerns or complaints. Report feels well this AM.    ROS: Patient denies fever, chills, blurred vision, dizziness, nausea, vomiting, diarrhea, cough, shortness of breath or chest pain, joint or back/neck pain, headache, HA or mood change.   Objective:   No results found. No results for input(s): "WBC", "HGB", "HCT", "PLT" in the last 72 hours.  Recent Labs    07/08/23 0648  NA 138  K 4.3  CL 110  CO2 21*  GLUCOSE 92  BUN 18  CREATININE 1.07  CALCIUM 8.1*     Intake/Output Summary (Last 24 hours) at 07/09/2023 0846 Last data filed at 07/09/2023 0814 Gross per 24 hour  Intake 840 ml  Output 1850 ml  Net -1010 ml        Physical Exam: Vital Signs Blood pressure (!) 150/62, pulse 63, temperature 97.6 F (36.4 C), temperature source Oral, resp. rate 16, height 5\' 11"  (1.803 m), weight 93.2 kg, SpO2 96%.   Constitutional: No apparent distress. Appropriate appearance for age.  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 Functional deficits which require 3+ hours per day of interdisciplinary therapy in a comprehensive inpatient rehab setting. Physiatrist is providing close team supervision and 24 hour management of active medical problems listed below. Physiatrist and rehab team continue  to assess barriers to discharge/monitor patient progress toward functional and medical goals  Care Tool:  Bathing    Body parts bathed by patient: Right arm, Left arm, Chest, Abdomen, Front perineal area, Buttocks, Right upper leg, Right lower leg, Face, Left lower leg, Left upper leg         Bathing assist Assist Level: Moderate Assistance - Patient 50 - 74%     Upper Body Dressing/Undressing Upper body dressing   What is the patient wearing?: Pull over shirt    Upper body assist Assist Level: Minimal Assistance - Patient > 75%    Lower Body Dressing/Undressing Lower body dressing      What is the patient wearing?: Pants     Lower body assist Assist for lower body dressing: Maximal Assistance - Patient 25 - 49%     Toileting Toileting    Toileting assist Assist for toileting: Minimal Assistance - Patient > 75%     Transfers Chair/bed transfer  Transfers assist     Chair/bed transfer assist level: Moderate Assistance - Patient 50 - 74%     Locomotion Ambulation   Ambulation assist      Assist level: Minimal Assistance - Patient > 75% Assistive device: Walker-rolling Max distance: 120   Walk 10 feet activity   Assist  Assist level: Minimal Assistance - Patient > 75% Assistive device: Walker-rolling   Walk 50 feet activity   Assist    Assist level: Minimal Assistance - Patient > 75% Assistive device: Walker-rolling    Walk 150 feet activity   Assist    Assist level: Minimal Assistance - Patient > 75% Assistive device: Walker-rolling    Walk 10 feet on uneven surface  activity   Assist     Assist level: Minimal Assistance - Patient > 75% Assistive device: Walker-rolling   Wheelchair     Assist Is the patient using a wheelchair?: Yes Type of Wheelchair: Manual    Wheelchair assist level: Dependent - Patient 0% Max wheelchair distance: 150'    Wheelchair 50 feet with 2 turns activity    Assist         Assist Level: Dependent - Patient 0%   Wheelchair 150 feet activity     Assist      Assist Level: Dependent - Patient 0%   Blood pressure (!) 150/62, pulse 63, temperature 97.6 F (36.4 C), temperature source Oral, resp. rate 16, height 5\' 11"  (1.803 m), weight 93.2 kg, SpO2 96%.  Medical Problem List and Plan: 1. Functional deficits secondary to ischemic infarction left internal capsule as well as history of prior left brain infarcts             -patient may  shower             -ELOS/Goals: 10-14 days -supervision             Admit to CIR  -7-26: Report per wife she fell at home and broke her wrist, is currently hospitalized.  No others caregiver support available at home.   -Estimated discharge 07/21/23 2.  Antithrombotics: -DVT/anticoagulation:  Pharmaceutical: Lovenox             -antiplatelet therapy: Aspirin 81 mg daily and Brilinta 90 mg twice daily x 4 weeks then aspirin alone-since failed Plavix 3. Pain Management: Tylenol as needed 4. Mood/Behavior/Sleep: Namenda 10 mg twice daily.  Trazodone 50 mg as needed for sleep.             -antipsychotic agents: Provide emotional support  -Endorsing some very mild passive SI to PT; start on fluoxetine 10 mg daily, scheduled for neuropsych eval Pt with some lability but affect is otherwise normal endorses good appetite  5. Neuropsych/cognition: This patient is not capable of making decisions on his own behalf.  6. Skin/Wound Care: Routine skin checks 7. Fluids/Electrolytes/Nutrition: Routine and helpful follow-up chemistries  -Mild AKI on admission documentation, encourage p.o. intakes, recheck Monday  -7/27 continue encourage p.o. fluid intake  -728 recheck tomorrow BMP ordered  -7/30 creatinine/BUN up to 26/1.25, will start IVF normal saline  -8/1 creatinine and BUN improved  -8/2 IVF discontinued, monitor PO intake 8.  Hypertension.  Norvasc 5 mg daily, lisinopril 20 mg daily.  Monitor with increased mobility  7/30  will stop Norvasc  7/31 DC lisinopril 10mg   8/1 BP stable and appears orthostatic hypotension has improved, continue to monitor  8/2 Fair control, continue current regimen    07/09/2023    3:57 AM 07/08/2023    7:34 PM 07/08/2023    2:02 PM  Vitals with BMI  Systolic 150 154 161  Diastolic 62 87 75  Pulse 63 62 73     9.  Hyperlipidemia.  Lipitor 10.  BPH/Urinary urgency/frequency/incontinence- suggest timed voiding. Check PVR.  No documented retention  --7/28 remains continent, bladder  scan yesterday 122 continue to monitor  -7/31 remains continent, monitor as he is on IVF and will likely need to urinate for frequentyl 11.  Aortic stenosis status post TAVR March 2021 as well as history of CAD with stenting.  Follow-up outpatient with Dr. Gypsy Balsam. 12. Hx of mild dementia- on Namenda 13.  Orthostatic hypotension  -7/30 IVF normal saline 50 mL per hour and decrease Norvasc.  Abdominal binder and teds  -7/31 DC lisinopril, continue IVF  -8/2 monitor with DC IVF 14. Constipation.   -Continue senokot and PEG, add sorbitol prn  -8/2 LBM yesterday, cont to monitor  LOS: 8 days A FACE TO FACE EVALUATION WAS PERFORMED  Fanny Dance 07/09/2023, 8:46 AM

## 2023-07-09 NOTE — Progress Notes (Signed)
Physical Therapy Weekly Progress Note  Patient Details  Name: Michael Kidd MRN: [id number] Date of Birth: 1930/09/22  Beginning of progress report period: July 02, 2023 End of progress report period: July 09, 2023  Today's Date: 07/09/2023 PT Individual Time: 4403-4742 PT Individual Time Calculation (min): 71 min  and Today's Date: 07/09/2023 PT Missed Time: 4 Minutes Missed Time Reason: Patient fatigue  Patient has met 3 of 3 short term goals. Patient is making progress toward his LTGs, however he is still limited by cognition with decreased carryover within and between treatment sessions. Patient currently requires MinA for all functional mobility with the use of a RW and requires Mod/Max cueing at times for improved sequencing, safety awareness and RW management. Patient continues to demonstrate RLE weakness leading to poor foot clearance during gait and transfers leading to instability.   Patient continues to demonstrate the following deficits muscle weakness, decreased cardiorespiratoy endurance, decreased motor planning, decreased initiation, decreased awareness, decreased problem solving, decreased safety awareness, decreased memory, and delayed processing, and decreased sitting balance, decreased standing balance, hemiplegia, and decreased balance strategies and therefore will continue to benefit from skilled PT intervention to increase functional independence with mobility.  Patient progressing toward long term goals..  Continue plan of care.  PT Short Term Goals Week 1:  PT Short Term Goal 1 (Week 1): Patient will complete bed/chair transfer with LRAD and MinA PT Short Term Goal 1 - Progress (Week 1): Met PT Short Term Goal 2 (Week 1): Patient will ambulate >47' with LRAD and MinA PT Short Term Goal 2 - Progress (Week 1): Met PT Short Term Goal 3 (Week 1): Patient will ascend/descend x4 steps with BHR and MinA PT Short Term Goal 3 - Progress (Week 1): Met Week 2:  PT Short Term  Goal 1 (Week 2): Patient will perform bed/chair transfer with LRAD and CGA consistently PT Short Term Goal 2 (Week 2): Patient will ambulate >50' with LRAD and CGA consistently PT Short Term Goal 3 (Week 2): Patient will ascend/descend x4 steps with BHR and CGA  Skilled Therapeutic Interventions/Progress Updates:  Patient greeted supine in bed and agreeable to PT treatment session. Patient transitioned from supine to sitting EOB with the use of the bed rail and supv. While sitting EOB, therapist donned socks, shoes and abdominal binder for time management. Patient stood from elevated bed with RW and MinA for initiating standing- Patient then performed stand pivot transfer to wheelchair with RW and CGA for safety. VC for improved sequencing and RW management. Patient wheeled to/from his room and day room for time management and energy conservation.   Patient gait trained x180' with RW and CGA/MinA for improved stability/safety- VC throughout for improved RLE step length and foot clearance during swing phase of gait. Patient unable to demonstrate improvements without consistent VC and requires frequent standing rest breaks in order to improve gait mechanics.   Patient stood unsupported while reaching for bean bags on his Lt side, handing them to his Rt hand and then tossing them at the corn hole board- Patient performed 2 x 14 with a seated rest break in between. Therapist providing CGA/MinA for improved stability and safety with cues for not reaching out for the table for UE support and increased swing with RUE.   Patient reporting significant fatigue and requesting to return to his room. Patient stood from wheelchair with RW and MinA for initiating standing secondary to fatigue. Patient then gait trained back to his room with RW and CGA/MinA  as he fatigued. VC throughout for increased Rt foot clearance and step length which patient was able to improve, however unable to sustain as he fatigued and with  turns.    Therapy Documentation Precautions:  Precautions Precautions: Fall Restrictions Weight Bearing Restrictions: No  Pain: Patient reports pain in Rt knee, however unable to give a specific number. RN notified and agreeable to Voltaren gel.    Therapy/Group: Individual Therapy  Jackee Glasner 07/09/2023, 7:37 AM

## 2023-07-09 NOTE — Progress Notes (Signed)
Occupational Therapy Session Note  Patient Details  Name: Michael Kidd MRN: [id number] Date of Birth: 1930-10-22  Today's Date: 07/09/2023 OT Individual Time: 9528-4132 OT Individual Time Calculation (min): 71 min    Short Term Goals: Week 2:  OT Short Term Goal 1 (Week 2): Patient will complete toilet transfer with min A and LRAD OT Short Term Goal 2 (Week 2): Patient will complete 2/3 toileting tasks with min A OT Short Term Goal 3 (Week 2): Patient will maintain standing balance with min A for 4 minutes in preparation for BADL tasks    Skilled Therapeutic Interventions/Progress Updates:    Pt received lying supine.  Pt had no c/o pain and was receptive to OT treatment session.  Pt requested to take a shower.  Pt able to get EOB with Min A.  Sit<>stand with MInA from EOB with RW. Functional mobility ~52ft to shower.  Pt stated he need to use the bathroom prior to showering.  Stand <>pivot to toilet completed with Min A and RW with min verbal cuing for technique and safety awarenes.  Pt able to side step in shower with min A and min verbal cuing for technique.  Pt able to complete UB bathing with (S) while seated in the shower.  Pt did require min A for washing posterior back.  Lb bathing completed with Min A while standing with support of grab bars for washing buttocks.  Functional mobility to wheelchair ~43ft with CGA and RW.  Pt able to complete UB dressing with Min A for getting shirt over pts head.  LB dressing completed with Min A for threading feet through pants.  Pt required CGA while standing at sink to pull up his brief and pants.  Pt requested that OTS help him with shaving his face.  Total A required for shaving face while seated in wheelchair.  Pt able to complete oral care while sitting at sink in his wheelchair with Min A and min verbal cues for sequencing.  Pt left sitting in wheelchair with call light within reach, chair alarm set and all needs met.  Therapy  Documentation Precautions:  Precautions Precautions: Fall Restrictions Weight Bearing Restrictions: No      Therapy/Group: Individual Therapy  Liam Graham 07/09/2023, 9:04 AM

## 2023-07-09 NOTE — Evaluation (Signed)
Recreational Therapy Assessment and Plan  Patient Details  Name: Michael Kidd MRN: [id number] Date of Birth: [birth year] Today's Date: 07/09/2023  Rehab Potential:  Fair ELOS:   d/c 8/14  Assessment  Hospital Problem: Principal Problem:   Ischemic cerebrovascular accident (CVA) [facility])     Past Medical History:      Past Medical History:  Diagnosis Date   Allergic rhinitis 03/29/2014   Aortic valve stenosis 05/23/2020   Arthralgia 07/15/2016   Arthritis     Benign prostatic hyperplasia 02/14/2014   Benign prostatic hyperplasia with urinary frequency 11/02/2018   Bilateral chronic knee pain     BPH (benign prostatic hyperplasia)     CAD (coronary artery disease)      s/p remote LAD PCI   Chronic ischemic heart disease 08/13/2010   Chronic pain of both knees 09/01/2017   Coronary artery disease with PCI and stenting many years ago 10/19/2019   Depression 02/14/2014    Formatting of this note might be different from the original. PHQ-9 completed 02/26/14 PHQ-2 completed 02/14/14   Dizziness 03/12/2014   Dupuytren's contracture 07/15/2016   Dyspnea on exertion 08/15/2020   Dysthymia     Essential hypertension 03/12/2014   Flexor tendon bowstring 01/06/2016   Gastroesophageal reflux disease 08/18/2016   Hepatic steatosis 03/02/2014   History of CVA (cerebrovascular accident) 10/12/2019   HLD (hyperlipidemia) 02/14/2014   HTN (hypertension)     Late effect of cerebrovascular accident (CVA) 10/19/2019   Melanoma in situ of right upper extremity including shoulder (HCC) 08/07/2016    Formatting of this note might be different from the original. August 2017.  Dr. Chancy Milroy, [facility].   Murmur 05/23/2020   Myocardial infarction [facility])     Neuropathic pain, leg, bilateral 11/23/2017   Nonsustained ventricular tachycardia (HCC) 11/12/2020   Pure hypercholesterolemia 09/14/2018   Quadriceps weakness 09/01/2017   Renal cyst     Risk for falls 10/01/2016   S/P TAVR (transcatheter aortic valve  replacement) 09/24/2020   Short of breath on exertion      per patient   Stroke [facility])      November   Symptomatic severe aortic stenosis with normal ejection fraction 08/15/2020        Past Surgical History:       Past Surgical History:  Procedure Laterality Date   CATARACT EXTRACTION W/ INTRAOCULAR LENS  IMPLANT, BILATERAL       cataract repair       HERNIA REPAIR        unsure of date   LOOP RECORDER INSERTION N/A 08/28/2019    Procedure: LOOP RECORDER INSERTION;  Surgeon: Marinus Maw, MD;  Location: [facility] INVASIVE CV LAB;  Service: Cardiovascular;  Laterality: N/A;   PERCUTANEOUS CORONARY STENT INTERVENTION (PCI-S)       RIGHT/LEFT HEART CATH AND CORONARY ANGIOGRAPHY N/A 08/22/2020    Procedure: RIGHT/LEFT HEART CATH AND CORONARY ANGIOGRAPHY;  Surgeon: Marykay Lex, MD;  Location[facility] INVASIVE CV LAB;  Service: Cardiovascular;  Laterality: N/A;   TEE WITHOUT CARDIOVERSION N/A 09/24/2020    Procedure: TRANSESOPHAGEAL ECHOCARDIOGRAM (TEE);  Surgeon: Tonny Bollman, MD;  Location: [facility] OR;  Service: Open Heart Surgery;  Laterality: N/A;   TRANSCATHETER AORTIC VALVE REPLACEMENT, TRANSFEMORAL N/A 09/24/2020    Procedure: TRANSCATHETER AORTIC VALVE REPLACEMENT, TRANSFEMORAL - USING EDWARDS SAPIEN 3 ULTRA  VALVE SIZE ;  Surgeon: Tonny Bollman, MD;  Location: [facility] OR;  Service: Open Heart Surgery;  Laterality: N/A;  Assessment & Plan Clinical Impression: Patient is a 89 y.o. year old male with recent admission to the hospital on 7/17 with slurred speech, facial droop and R sided deficits. MRI brain showed small area of acute ischemia of L corpus striatum. PMH: HTN, CVA, depression, BPH, aortic stenosis status post TAVR, CAD s/p stenting.  Patient transferred to CIR on 07/01/2023 .     Pt presents with decreased activity tolerance, decreased functional mobility, decreased balance, decreased initiation, decreased attention, decreased awareness, and decreased safety awareness,  feelings of stress and depressed mood.  Met with pt today to discuss TR services emphasizing social, emotional, spiritual health in addition to physical health and their effects on overall health and wellness.  Pt tearful throughout session.   Plan  Min 1 TR session >20 minutes during LOS  Recommendations for other services: None   Discharge Criteria: Patient will be discharged from TR if patient refuses treatment 3 consecutive times without medical reason.  If treatment goals not met, if there is a change in medical status, if patient makes no progress towards goals or if patient is discharged from hospital.  The above assessment, treatment plan, treatment alternatives and goals were discussed and mutually agreed upon: by patient  Michael Kidd 07/09/2023, 12:33 PM

## 2023-07-09 NOTE — Progress Notes (Signed)
Speech Language Pathology Weekly Progress and Session Note  Patient Details  Name: Michael Kidd MRN: [id number] Date of Birth: 1929-12-17  Beginning of progress report period: July 02, 2023 End of progress report period: July 09, 2023  Today's Date: 07/09/2023 SLP Individual Time: 1330-1410 SLP Individual Time Calculation (min): 40 min  Short Term Goals: Week 1: SLP Short Term Goal 1 (Week 1): Pt will utilize compensatory word finding strategies to repair communication breakdowns with >75% accuracy given min cues SLP Short Term Goal 1 - Progress (Week 1): Not met SLP Short Term Goal 2 (Week 1): Pt will follow 2-3 step auditory commands with >75% accuracy given min cues. SLP Short Term Goal 2 - Progress (Week 1): Met SLP Short Term Goal 3 (Week 1): Pt will utilize external aids to answer orientation questions with >80% accuracy SLP Short Term Goal 3 - Progress (Week 1): Met    New Short Term Goals: Week 2: SLP Short Term Goal 1 (Week 2): Pt will utilize compensatory word finding strategies to repair communication breakdowns with >75% accuracy given min cues during informal converation at the phrase level. SLP Short Term Goal 2 (Week 2): Patient will complete functional naming tasks with 90% accuracy and supervision level verbal cues. SLP Short Term Goal 3 (Week 2): Pt will follow 2-3 step auditory commands with >75% accuracy given supervision level verbal cues. SLP Short Term Goal 4 (Week 2): Pt will utilize external aids to answer orientation questions with >90% accuracy and supervision level verbal cues for use of external aids. SLP Short Term Goal 5 (Week 2): Patient will improve intelligbility to 90% at the phrase level with Min verbal cues for use of intelligibility strategies.  Weekly Progress Updates: Patient has made functional gains and has met 2 of 3 STGs this reporting period. Currently, patient requires overall Min-Mod A for functional communication regarding utilization of  strategies to maximize word-finding and speech intelligibility at the phrase and sentence level. Patient demonstrates improved orientation to place and situation but requires cues for use of external aids for orientation to time. Patient's overall function can be impacted by lability at times. Patient and family education is complete. Patient would benefit from continued skilled SLP intervention to maximize his cognitive-linguistic functioning and overall functional independence prior to discharge.     Intensity: Minumum of 1-2 x/day, 30 to 90 minutes Frequency: 1 to 3 out of 7 days Duration/Length of Stay: 07/21/23 Treatment/Interventions: Cueing hierarchy;Functional tasks;Patient/family education;Therapeutic Activities;Speech/Language facilitation;Internal/external aids;Environmental controls;Cognitive remediation/compensation   Daily Session  Skilled Therapeutic Interventions:   Skilled treatment session focused on cognitive and communication goals. Upon arrival, patient was awake in bed and agreeable to treatment session. Patient was independently oriented to place and situation but required Min verbal cues for orientation to date with SLP providing an external aid to maximize recall of orientation information. Patient participated in a basic conversation regarding biographical information and events from previous therapy sessions with supervision level verbal cues for word-finding and Min verbal cues to maximize intelligibility with an increased vocal intensity. Patient requested to call his wife with appropriate word-finding observed but Mod verbal cues for use of an increased vocal intensity. Patient became tearful during conversation with SLP providing encouragement and support. Patient left upright in bed with alarm on and all needs within reach. Continue with current plan of care.      Pain No/Denies pain   Therapy/Group: Individual Therapy  Sally-Anne Wamble 07/09/2023, 3:34  PM

## 2023-07-10 MED ORDER — POLYETHYLENE GLYCOL 3350 17 G PO PACK
17.0000 g | PACK | Freq: Two times a day (BID) | ORAL | Status: DC
  Administered 2023-07-10 – 2023-07-20 (×19): 17 g via ORAL
  Filled 2023-07-10 (×20): qty 1

## 2023-07-10 NOTE — Progress Notes (Signed)
Physical Therapy Session Note  Patient Details  Name: Michael Kidd MRN: [id number] Date of Birth: 01-09-1930  Today's Date: 07/10/2023 PT Individual Time: 1415-      Short Term Goals: Week 2:  PT Short Term Goal 1 (Week 2): Patient will perform bed/chair transfer with LRAD and CGA consistently PT Short Term Goal 2 (Week 2): Patient will ambulate >50' with LRAD and CGA consistently PT Short Term Goal 3 (Week 2): Patient will ascend/descend x4 steps with BHR and CGA  Skilled Therapeutic Interventions/Progress Updates:    Chart reviewed. Pt greeted and asked to participate in PT. Pt declined c/o need for medication to alleviate constipation. PT clarified that pt was asking for medication and assisted follow up with RN, who was already bringing medicine. PT then asked pt to participate after administration of medicine. Pt again declined politely stating he did not feel well. PT offered to attempt use of amb to promote GI motility and assist medication. Pt again politely declined. PT then reviewed need to make up any missed time with PT, and continued encouraging pt to try amb to reduce constipation symptoms. Pt again declined.  At end of session, pt was left reclined in bed with alarm engaged, nurse call bell and all needs in reach.     Therapy Documentation Precautions:  Precautions Precautions: Fall Restrictions Weight Bearing Restrictions: No General: PT Amount of Missed Time (min): 30 Minutes PT Missed Treatment Reason: Patient ill (Comment) (strong constipation)    Therapy/Group: Individual Therapy  Dionne Milo, PT, DPT 07/10/2023, 4:44 PM

## 2023-07-10 NOTE — Progress Notes (Signed)
PROGRESS NOTE   Subjective/Complaints: No new complaints this morning Eating lunch Declined therapy due to constipation today, miralax increased to BID   ROS: Patient denies fever, chills, blurred vision, dizziness, nausea, vomiting, diarrhea, cough, shortness of breath or chest pain, joint or back/neck pain, headache, HA or mood change.   Objective:   No results found. No results for input(s): "WBC", "HGB", "HCT", "PLT" in the last 72 hours.  Recent Labs    07/08/23 0648  NA 138  K 4.3  CL 110  CO2 21*  GLUCOSE 92  BUN 18  CREATININE 1.07  CALCIUM 8.1*     Intake/Output Summary (Last 24 hours) at 07/10/2023 1707 Last data filed at 07/10/2023 1558 Gross per 24 hour  Intake 720 ml  Output 650 ml  Net 70 ml        Physical Exam: Vital Signs Blood pressure (!) 144/83, pulse 64, temperature 97.9 F (36.6 C), resp. rate 17, height 5\' 11"  (1.803 m), weight 93.2 kg, SpO2 (!) 77%. 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 Functional deficits which require 3+ hours per day of interdisciplinary therapy in a comprehensive inpatient rehab setting. Physiatrist is providing close team supervision and 24 hour management of active medical problems listed below. Physiatrist and rehab team continue to assess barriers to discharge/monitor patient progress toward functional and medical goals  Care Tool:  Bathing    Body parts bathed by patient: Right arm,  Left arm, Chest, Abdomen, Front perineal area, Buttocks, Right upper leg, Right lower leg, Face, Left lower leg, Left upper leg         Bathing assist Assist Level: Moderate Assistance - Patient 50 - 74%     Upper Body Dressing/Undressing Upper body dressing   What is the patient wearing?: Pull over shirt    Upper body assist Assist Level: Minimal Assistance - Patient > 75%    Lower Body Dressing/Undressing Lower body dressing      What is the patient wearing?: Pants     Lower body assist Assist for lower body dressing: Maximal Assistance - Patient 25 - 49%     Toileting Toileting    Toileting assist Assist for toileting: Minimal Assistance - Patient > 75%     Transfers Chair/bed transfer  Transfers assist     Chair/bed transfer assist level: Moderate Assistance - Patient 50 - 74%     Locomotion Ambulation   Ambulation assist      Assist level: Minimal Assistance - Patient > 75% Assistive device: Walker-rolling Max distance: 120   Walk 10 feet activity   Assist     Assist level: Minimal Assistance - Patient > 75% Assistive device: Walker-rolling   Walk 50 feet activity   Assist  Assist level: Minimal Assistance - Patient > 75% Assistive device: Walker-rolling    Walk 150 feet activity   Assist    Assist level: Minimal Assistance - Patient > 75% Assistive device: Walker-rolling    Walk 10 feet on uneven surface  activity   Assist     Assist level: Minimal Assistance - Patient > 75% Assistive device: Walker-rolling   Wheelchair     Assist Is the patient using a wheelchair?: Yes Type of Wheelchair: Manual    Wheelchair assist level: Dependent - Patient 0% Max wheelchair distance: 150'    Wheelchair 50 feet with 2 turns activity    Assist        Assist Level: Dependent - Patient 0%   Wheelchair 150 feet activity     Assist      Assist Level: Dependent - Patient 0%   Blood pressure (!) 144/83,  pulse 64, temperature 97.9 F (36.6 C), resp. rate 17, height 5\' 11"  (1.803 m), weight 93.2 kg, SpO2 (!) 77%.  Medical Problem List and Plan: 1. Functional deficits secondary to ischemic infarction left internal capsule as well as history of prior left brain infarcts             -patient may  shower             -ELOS/Goals: 10-14 days -supervision             Continue CIR  -7-26: Report per wife she fell at home and broke her wrist, is currently hospitalized.  No others caregiver support available at home.   -Estimated discharge 07/21/23 2.  Antithrombotics: -DVT/anticoagulation:  Pharmaceutical: Lovenox             -antiplatelet therapy: Aspirin 81 mg daily and Brilinta 90 mg twice daily x 4 weeks then aspirin alone-since failed Plavix 3. Pain Management: Tylenol as needed 4. Mood/Behavior/Sleep: Namenda 10 mg twice daily.  Continue Trazodone 50 mg as needed for sleep.             -antipsychotic agents: Provide emotional support  -Endorsing some very mild passive SI to PT; continue fluoxetine 10 mg daily, scheduled for neuropsych eval Pt with some lability but affect is otherwise normal endorses good appetite  5. Neuropsych/cognition: This patient is not capable of making decisions on his own behalf.  6. Skin/Wound Care: Routine skin checks 7. Fluids/Electrolytes/Nutrition: Routine and helpful follow-up chemistries  -Mild AKI on admission documentation, encourage p.o. intakes, recheck Monday  -7/27 continue encourage p.o. fluid intake  -728 recheck tomorrow BMP ordered  -7/30 creatinine/BUN up to 26/1.25, will start IVF normal saline  -8/1 creatinine and BUN improved  -8/2 IVF discontinued, monitor PO intake 8.  Hypertension.  Norvasc 5 mg daily, lisinopril 20 mg daily.  Monitor with increased mobility  7/30 will stop Norvasc  7/31 DC lisinopril 10mg   8/1 BP stable and appears orthostatic hypotension has improved, continue to monitor  8/2 Fair control, continue current regimen     07/10/2023   12:51 PM 07/10/2023    4:46 AM 07/09/2023    8:00 PM  Vitals with BMI  Systolic 144 154 161  Diastolic 83 84 71  Pulse  64 77     9.  Hyperlipidemia.  Lipitor 10.  BPH/Urinary urgency/frequency/incontinence- suggest timed voiding. Check PVR.  No documented retention  --7/28 remains continent, bladder scan yesterday 122 continue to monitor  -7/31 remains continent, monitor as he is on IVF and will likely need to urinate for frequentyl 11.  Aortic  stenosis status post TAVR March 2021 as well as history of CAD with stenting.  Follow-up outpatient with Dr. Gypsy Balsam. 12. Hx of mild dementia- on Namenda 13.  Orthostatic hypotension  -7/30 IVF normal saline 50 mL per hour and decrease Norvasc.  Abdominal binder and teds  -7/31 DC lisinopril, continue IVF  -8/2 monitor with DC IVF 14. Constipation.   -Continue senokot and PEG, add sorbitol prn  -increase miralax to BID  LOS: 9 days A FACE TO FACE EVALUATION WAS PERFORMED  Clint Bolder P Britanie Harshman 07/10/2023, 5:07 PM

## 2023-07-10 NOTE — Progress Notes (Signed)
Physical Therapy Session Note  Patient Details  Name: Michael Kidd MRN: [id number] Date of Birth: 02-26-1930  Today's Date: 07/10/2023 PT Individual Time: 1105-1200 PT Individual Time Calculation (min): 55 min   Short Term Goals: Week 1:  PT Short Term Goal 1 (Week 1): Patient will complete bed/chair transfer with LRAD and MinA PT Short Term Goal 1 - Progress (Week 1): Met PT Short Term Goal 2 (Week 1): Patient will ambulate >74' with LRAD and MinA PT Short Term Goal 2 - Progress (Week 1): Met PT Short Term Goal 3 (Week 1): Patient will ascend/descend x4 steps with BHR and MinA PT Short Term Goal 3 - Progress (Week 1): Met Week 2:  PT Short Term Goal 1 (Week 2): Patient will perform bed/chair transfer with LRAD and CGA consistently PT Short Term Goal 2 (Week 2): Patient will ambulate >50' with LRAD and CGA consistently PT Short Term Goal 3 (Week 2): Patient will ascend/descend x4 steps with BHR and CGA  Skilled Therapeutic Interventions/Progress Updates: Patient supine in bed on entrance to room. Patient alert and agreeable to PT session.   Patient reported no pain at beginning of PT session. Pt presented with increased SOB when sitting in WC after being transported to main gym for time management. Pt reported that "sometimes it is hard to breath." Vitals checked and placed below. Symptoms subsided shortly after. Pt had no further episodes of SOB throughout remainder of the PT session.   Therapeutic Activity: Bed Mobility: Pt performed supine to R and L sidelying for PTA to assist in doffing/donning brief (not soiled, just ripped) with VC to use UE's to assist with pulling self over via rails. Pt then performed bridge to donn personal pants with supervision. Pt supine to sit with minA and VC for hand placement.  Transfers: Pt performed sit<>stand transfers throughout session with minA. Provided VC for anterior scoot and hand placement per use of B UE on RW, and to reach back when sitting  down.   NMR performed for improvements in motor control and coordination, balance, sequencing, judgement, and self confidence/ efficacy in performing all aspects of mobility at highest level of independence.   Therapeutic Exercise: - Pt ambulated 200'+ (in order to increase endurance and gait tolerance) using RW with CGA for safety and with WC. Pt with decreased B foot clearance, and forward flexed posture (cues to stand upright).  Patient in [facility] at end of session with brakes locked, belt alarm set, and all needs within reach.      Therapy Documentation Precautions:  Precautions Precautions: Fall Restrictions Weight Bearing Restrictions: No   Therapy/Group: Individual Therapy  Kathleen Tamm PTA 07/10/2023, 12:33 PM

## 2023-07-11 NOTE — Plan of Care (Signed)
  Problem: Consults Goal: RH STROKE PATIENT EDUCATION Description: See Patient Education module for education specifics  Outcome: Progressing   Problem: RH BOWEL ELIMINATION Goal: RH STG MANAGE BOWEL WITH ASSISTANCE Description: STG Manage Bowel with min Assistance. Outcome: Progressing   Problem: RH BLADDER ELIMINATION Goal: RH STG MANAGE BLADDER WITH ASSISTANCE Description: STG Manage Bladder With min Assistance Outcome: Progressing Goal: RH STG MANAGE BLADDER WITH MEDICATION WITH ASSISTANCE Description: STG Manage Bladder With Medication With min Assistance. Outcome: Progressing   Problem: RH SAFETY Goal: RH STG ADHERE TO SAFETY PRECAUTIONS W/ASSISTANCE/DEVICE Description: STG Adhere to Safety Precautions With cueing Assistance/Device. Outcome: Progressing Goal: RH STG DECREASED RISK OF FALL WITH ASSISTANCE Description: STG Decreased Risk of Fall With min Assistance. Outcome: Progressing   Problem: RH COGNITION-NURSING Goal: RH STG USES MEMORY AIDS/STRATEGIES W/ASSIST TO PROBLEM SOLVE Description: STG Uses Memory Aids/Strategies With cueing Assistance to Problem Solve. Outcome: Progressing   Problem: RH PAIN MANAGEMENT Goal: RH STG PAIN MANAGED AT OR BELOW PT'S PAIN GOAL Description: Pain will be managed 4 out of 10 on pain scale with PRN medications min assist Outcome: Progressing   Problem: RH KNOWLEDGE DEFICIT Goal: RH STG INCREASE KNOWLEDGE OF HYPERTENSION Description: Patient/caregiver will be able to manage medications and monitor blood pressure from nursing education, nursing handouts, and from other resources independently   Outcome: Progressing Goal: RH STG INCREASE KNOWLEGDE OF HYPERLIPIDEMIA Description: Patient/caregiver will be able to manage medications and improve HDL levels from nursing education, nursing handouts and other resources independently  Outcome: Progressing Goal: RH STG INCREASE KNOWLEDGE OF STROKE PROPHYLAXIS Description: Patient/caregiver  will be able to manage stroke medications (ASA, Brilinta) from nursing education and nursing handouts independently  Outcome: Progressing

## 2023-07-11 NOTE — Progress Notes (Signed)
PROGRESS NOTE   Subjective/Complaints: No new complaints this morning Sleepy Reviewed chart and had BM yesterday No issues overnight   ROS: Patient denies fever, chills, blurred vision, dizziness, nausea, vomiting, diarrhea, cough, shortness of breath or chest pain, joint or back/neck pain, headache, HA or mood change.   Objective:   No results found. No results for input(s): "WBC", "HGB", "HCT", "PLT" in the last 72 hours.  No results for input(s): "NA", "K", "CL", "CO2", "GLUCOSE", "BUN", "CREATININE", "CALCIUM" in the last 72 hours.    Intake/Output Summary (Last 24 hours) at 07/11/2023 1651 Last data filed at 07/11/2023 1431 Gross per 24 hour  Intake 817 ml  Output 1100 ml  Net -283 ml        Physical Exam: Vital Signs Blood pressure (!) 151/80, pulse 71, temperature 98.1 F (36.7 C), temperature source Oral, resp. rate 18, height 5\' 11"  (1.803 m), weight 93.2 kg, SpO2 94%. 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 Functional deficits which require 3+ hours per day of interdisciplinary therapy in a comprehensive inpatient rehab setting. Physiatrist is providing close team supervision and 24 hour management of active medical problems listed below. Physiatrist and rehab team continue to assess barriers to discharge/monitor patient progress toward functional and medical goals  Care Tool:  Bathing    Body parts bathed by patient: Right arm, Left  arm, Chest, Abdomen, Front perineal area, Buttocks, Right upper leg, Right lower leg, Face, Left lower leg, Left upper leg         Bathing assist Assist Level: Moderate Assistance - Patient 50 - 74%     Upper Body Dressing/Undressing Upper body dressing   What is the patient wearing?: Pull over shirt    Upper body assist Assist Level: Minimal Assistance - Patient > 75%    Lower Body Dressing/Undressing Lower body dressing      What is the patient wearing?: Pants     Lower body assist Assist for lower body dressing: Maximal Assistance - Patient 25 - 49%     Toileting Toileting    Toileting assist Assist for toileting: Minimal Assistance - Patient > 75%     Transfers Chair/bed transfer  Transfers assist     Chair/bed transfer assist level: Moderate Assistance - Patient 50 - 74%     Locomotion Ambulation   Ambulation assist      Assist level: Minimal Assistance - Patient > 75% Assistive device: Walker-rolling Max distance: 120   Walk 10 feet activity   Assist     Assist level: Minimal Assistance - Patient > 75% Assistive device: Walker-rolling   Walk 50 feet activity   Assist    Assist level: Minimal Assistance - Patient > 75% Assistive device: Walker-rolling  Walk 150 feet activity   Assist    Assist level: Minimal Assistance - Patient > 75% Assistive device: Walker-rolling    Walk 10 feet on uneven surface  activity   Assist     Assist level: Minimal Assistance - Patient > 75% Assistive device: Walker-rolling   Wheelchair     Assist Is the patient using a wheelchair?: Yes Type of Wheelchair: Manual    Wheelchair assist level: Dependent - Patient 0% Max wheelchair distance: 150'    Wheelchair 50 feet with 2 turns activity    Assist        Assist Level: Dependent - Patient 0%   Wheelchair 150 feet activity     Assist      Assist Level: Dependent - Patient 0%   Blood pressure (!) 151/80, pulse 71,  temperature 98.1 F (36.7 C), temperature source Oral, resp. rate 18, height 5\' 11"  (1.803 m), weight 93.2 kg, SpO2 94%.  Medical Problem List and Plan: 1. Functional deficits secondary to ischemic infarction left internal capsule as well as history of prior left brain infarcts             -patient may  shower             -ELOS/Goals: 10-14 days -supervision             Continue CIR  -7-26: Report per wife she fell at home and broke her wrist, is currently hospitalized.  No others caregiver support available at home.   -Estimated discharge 07/21/23 2.  Antithrombotics: -DVT/anticoagulation:  Pharmaceutical: Lovenox             -antiplatelet therapy: Aspirin 81 mg daily and Brilinta 90 mg twice daily x 4 weeks then aspirin alone-since failed Plavix 3. Pain Management: Tylenol as needed 4. Mood/Behavior/Sleep: Namenda 10 mg twice daily.  Continue Trazodone 50 mg as needed for sleep.             -antipsychotic agents: Provide emotional support  -Endorsing some very mild passive SI to PT; continue fluoxetine 10 mg daily, scheduled for neuropsych eval Pt with some lability but affect is otherwise normal endorses good appetite  5. Neuropsych/cognition: This patient is not capable of making decisions on his own behalf.  6. Skin/Wound Care: Routine skin checks 7. Fluids/Electrolytes/Nutrition: Routine and helpful follow-up chemistries  -Mild AKI on admission documentation, encourage p.o. intakes, recheck Monday  -7/27 continue encourage p.o. fluid intake  -728 recheck tomorrow BMP ordered  -7/30 creatinine/BUN up to 26/1.25, will start IVF normal saline  -8/1 creatinine and BUN improved  -8/2 IVF discontinued, monitor PO intake 8.  Hypertension.  Norvasc 5 mg daily, lisinopril 20 mg daily.  Monitor with increased mobility  7/30 will stop Norvasc  7/31 DC lisinopril 10mg   8/1 BP stable and appears orthostatic hypotension has improved, continue to monitor  8/2 Fair control, continue current  regimen    07/11/2023   12:53 PM 07/11/2023    4:05 AM 07/10/2023    8:07 PM  Vitals with BMI  Systolic 151 119 621  Diastolic 80 57 52  Pulse 71 51 65     9.  Hyperlipidemia.  Continue Lipitor  10.  BPH/Urinary urgency/frequency/incontinence- suggest timed voiding. Check PVR.  No documented retention  --7/28 remains continent, bladder scan yesterday 122 continue to monitor  -7/31 remains continent, monitor as he is on IVF and will likely need to urinate for frequentyl  11.  Aortic stenosis status post TAVR March 2021 as well as  history of CAD with stenting.  Follow-up outpatient with Dr. Gypsy Balsam.  12. Hx of mild dementia- continue Namenda  13.  Orthostatic hypotension  -7/30 IVF normal saline 50 mL per hour and decrease Norvasc.  Abdominal binder and teds  -7/31 DC lisinopril, continue IVF  -8/2 monitor with DC IVF  8/4: BP reviewed and slightly elevated, will not add medication given history of orthostatic hypotension 14. Constipation.   -Continue senokot and PEG, add sorbitol prn  continue miralax to BID  LOS: 10 days A FACE TO FACE EVALUATION WAS PERFORMED  Drema Pry Dezmon Conover 07/11/2023, 4:51 PM

## 2023-07-12 NOTE — Progress Notes (Addendum)
    Occupational Therapy Session Note  Patient Details  Name: Michael Kidd MRN: [id number] Date of Birth: May 28, 1930  Today's Date: 07/12/2023 OT Individual Time: 1010-1040 OT Individual Time Calculation (min): 30 min  and Today's Date: 07/12/2023 OT Missed Time: 45 Minutes Missed Time Reason: Patient fatigue;Patient ill (comment) (pt unable to describe but does not feel well)  Short Term Goals: Week 2:  OT Short Term Goal 1 (Week 2): Patient will complete toilet transfer with min A and LRAD OT Short Term Goal 2 (Week 2): Patient will complete 2/3 toileting tasks with min A OT Short Term Goal 3 (Week 2): Patient will maintain standing balance with min A for 4 minutes in preparation for BADL tasks  Skilled Therapeutic Interventions/Progress Updates:    OT attempted to see pt at scheduled time. Pt greeted sidelying in bed asleep. Pt opened his eyes and stated he was too tired to get up and just wanted to sleep. This is very unlike pt. He is usually motivated and participatory. Will share pt status with nursing. Pt left sidelying in bed.   OT returned later in the morning and pt already sitting EOB. Per nursing, pt washed his face and feet already. PT declined washing further or need to go to the bathroom. Pt stated " I just want to lay back down." OT able to get pt to agree to get dressed seated EOB. Pt able to thread B LE's into pants with increased time, min A, and min cues for technique. Sit<>stand at EOB with min A and cues for anterior weight shift w/ RW. Pt needed min A to get pants over hips. Pt returned to sitting and stated he did not feel good. Pt agreeable to practice 5 more sit<>stands w/ RW. Facilitation for anterior weight shift with min to mod A to initiate full stand. Pt declined further activity despite encouragement. Pt returned to supine with supervision and left semi-reclined in bed with bed alarm on, call bell in reach, and needs met.   Therapy Documentation Precautions:   Precautions Precautions: Fall Restrictions Weight Bearing Restrictions: No General: General OT Amount of Missed Time: 45 Minutes Pain:  Pt reports not feeling well but no pain reported. Rest and repositioned  Other Treatments:     Therapy/Group: Individual Therapy  Mal Amabile 07/12/2023, 10:57 AM

## 2023-07-12 NOTE — Progress Notes (Signed)
Speech Language Pathology Daily Session Note  Patient Details  Name: Michael Kidd MRN: [id number] Date of Birth: [birth year]  Today's Date: 07/12/2023 SLP Individual Time: 0730-0825 SLP Individual Time Calculation (min): 55 min  Short Term Goals: Week 2: SLP Short Term Goal 1 (Week 2): Pt will utilize compensatory word finding strategies to repair communication breakdowns with >75% accuracy given min cues during informal converation at the phrase level. SLP Short Term Goal 2 (Week 2): Patient will complete functional naming tasks with 90% accuracy and supervision level verbal cues. SLP Short Term Goal 3 (Week 2): Pt will follow 2-3 step auditory commands with >75% accuracy given supervision level verbal cues. SLP Short Term Goal 4 (Week 2): Pt will utilize external aids to answer orientation questions with >90% accuracy and supervision level verbal cues for use of external aids. SLP Short Term Goal 5 (Week 2): Patient will improve intelligbility to 90% at the phrase level with Min verbal cues for use of intelligibility strategies.  Skilled Therapeutic Interventions: Skilled treatment session focused on cognitive communication goals. Upon arrival, patient was sitting EOB and attempting to consume his breakfast meal. Patient reported he did not like anything on his tray but agreeable to consuming yogurt and a fruit cup that SLP retrieved from floor stock. Patient also independently requested cold water and a fresh cup of coffee. Patient consumed ~50% of meal with intermittent encouragement needed. Patient perseverative on not feeling well but unable to elaborate. At one point, patient did verbalize that he was feeling dizzy. SLP obtained vitals and all were [facility]. Patient requested to lay down. Patient participated in a structured verbal task with 90% accuracy for word-finding with naming of functional items and overall Mod A multimodal cues needed to describe the items at the phrase level. Patient was  overall 80% intelligible with Mod verbal cues needed for use of an increased vocal intensity. Patient requested to use the urinal but was unable to void. SLP encouraged patient so stand or ambulate to the commode with patient declining despite encouragement. Patient left supine in bed with alarm on and all needs within reach. Continue with current plan of care.      Pain No/Denies Pain but perseverates on "not feeling well" but unable to elaborate. Nursing aware  Therapy/Group: Individual Therapy  Azelie Noguera 07/12/2023, 9:26 AM

## 2023-07-12 NOTE — Progress Notes (Signed)
PROGRESS NOTE   Subjective/Complaints: No new complaints or concerns this morning.  Patient asked me to call his wife for him to speak with her.   ROS: Patient denies fever, malaise, abdominal pain,  blurred vision, dizziness, nausea, vomiting, diarrhea, cough, shortness of breath or chest pain, joint or back/neck pain, headache, HA or mood change.   Objective:   No results found. No results for input(s): "WBC", "HGB", "HCT", "PLT" in the last 72 hours.  No results for input(s): "NA", "K", "CL", "CO2", "GLUCOSE", "BUN", "CREATININE", "CALCIUM" in the last 72 hours.    Intake/Output Summary (Last 24 hours) at 07/12/2023 1408 Last data filed at 07/12/2023 1300 Gross per 24 hour  Intake 714 ml  Output 1150 ml  Net -436 ml        Physical Exam: Vital Signs Blood pressure 131/74, pulse 77, temperature (!) 97.5 F (36.4 C), resp. rate 18, height 5\' 11"  (1.803 m), weight 93.2 kg, SpO2 97%. 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 Functional deficits which require 3+ hours per day of interdisciplinary therapy in a comprehensive inpatient rehab setting. Physiatrist is providing close team supervision and 24 hour management of active medical problems listed below. Physiatrist and rehab team continue to assess barriers to discharge/monitor patient progress toward functional and medical goals  Care Tool:  Bathing     Body parts bathed by patient: Right arm, Left arm, Chest, Abdomen, Front perineal area, Buttocks, Right upper leg, Right lower leg, Face, Left lower leg, Left upper leg         Bathing assist Assist Level: Moderate Assistance - Patient 50 - 74%     Upper Body Dressing/Undressing Upper body dressing   What is the patient wearing?: Pull over shirt    Upper body assist Assist Level: Minimal Assistance - Patient > 75%    Lower Body Dressing/Undressing Lower body dressing      What is the patient wearing?: Pants     Lower body assist Assist for lower body dressing: Maximal Assistance - Patient 25 - 49%     Toileting Toileting    Toileting assist Assist for toileting: Minimal Assistance - Patient > 75%     Transfers Chair/bed transfer  Transfers assist     Chair/bed transfer assist level: Moderate Assistance - Patient 50 - 74%     Locomotion Ambulation   Ambulation assist      Assist level: Minimal Assistance - Patient > 75% Assistive device: Walker-rolling Max distance: 120   Walk 10 feet activity   Assist     Assist level: Minimal Assistance - Patient > 75% Assistive device: Walker-rolling   Walk 50 feet activity   Assist    Assist  level: Minimal Assistance - Patient > 75% Assistive device: Walker-rolling    Walk 150 feet activity   Assist    Assist level: Minimal Assistance - Patient > 75% Assistive device: Walker-rolling    Walk 10 feet on uneven surface  activity   Assist     Assist level: Minimal Assistance - Patient > 75% Assistive device: Walker-rolling   Wheelchair     Assist Is the patient using a wheelchair?: Yes Type of Wheelchair: Manual    Wheelchair assist level: Dependent - Patient 0% Max wheelchair distance: 150'    Wheelchair 50 feet with 2 turns activity    Assist        Assist Level: Dependent - Patient 0%   Wheelchair 150 feet activity     Assist      Assist Level: Dependent -  Patient 0%   Blood pressure 131/74, pulse 77, temperature (!) 97.5 F (36.4 C), resp. rate 18, height 5\' 11"  (1.803 m), weight 93.2 kg, SpO2 97%.  Medical Problem List and Plan: 1. Functional deficits secondary to ischemic infarction left internal capsule as well as history of prior left brain infarcts             -patient may  shower             -ELOS/Goals: 10-14 days -supervision             Continue CIR  -7-26: Report per wife she fell at home and broke her wrist, is currently hospitalized.  No others caregiver support available at home.   -Estimated discharge 07/21/23 2.  Antithrombotics: -DVT/anticoagulation:  Pharmaceutical: Lovenox             -antiplatelet therapy: Aspirin 81 mg daily and Brilinta 90 mg twice daily x 4 weeks then aspirin alone-since failed Plavix 3. Pain Management: Tylenol as needed 4. Mood/Behavior/Sleep: Namenda 10 mg twice daily.  Continue Trazodone 50 mg as needed for sleep.             -antipsychotic agents: Provide emotional support  -Endorsing some very mild passive SI to PT; continue fluoxetine 10 mg daily, scheduled for neuropsych eval Pt with some lability but affect is otherwise normal endorses good appetite  5. Neuropsych/cognition: This patient is not capable of making decisions on his own behalf.  6. Skin/Wound Care: Routine skin checks 7. Fluids/Electrolytes/Nutrition: Routine and helpful follow-up chemistries  -Mild AKI on admission documentation, encourage p.o. intakes, recheck Monday  -7/27 continue encourage p.o. fluid intake  -728 recheck tomorrow BMP ordered  -7/30 creatinine/BUN up to 26/1.25, will start IVF normal saline  -8/1 creatinine and BUN improved  -8/2 IVF discontinued, monitor PO intake  -8/5 BUN/Cr stable at 18/1.07  8.  Hypertension.  Norvasc 5 mg daily, lisinopril 20 mg daily.  Monitor with increased mobility  7/30 will stop Norvasc  7/31 DC lisinopril 10mg   8/1 BP stable and appears orthostatic hypotension has improved,  continue to monitor  8/5 controlled    07/12/2023    8:06 AM 07/12/2023    5:42 AM 07/11/2023    7:23 PM  Vitals with BMI  Systolic 131 120 295  Diastolic 74 69 64  Pulse 77 64 74     9.  Hyperlipidemia.  Continue Lipitor  10.  BPH/Urinary urgency/frequency/incontinence- suggest timed voiding. Check PVR.  No documented retention  --7/28 remains continent, bladder scan yesterday 122 continue to monitor  -7/31 remains continent, monitor as he is on IVF and will likely need to urinate for  frequentyl  11.  Aortic stenosis status post TAVR March 2021 as well as history of CAD with stenting.  Follow-up outpatient with Dr. Gypsy Balsam.  12. Hx of mild dementia- continue Namenda  13.  Orthostatic hypotension  -7/30 IVF normal saline 50 mL per hour and decrease Norvasc.  Abdominal binder and teds  -7/31 DC lisinopril, continue IVF  -8/2 monitor with DC IVF  8/4: BP reviewed and slightly elevated, will not add medication given history of orthostatic hypotension  14. Constipation.   -Continue senokot and PEG, add sorbitol prn  continue miralax to BID  LOS: 11 days A FACE TO FACE EVALUATION WAS PERFORMED  Fanny Dance 07/12/2023, 2:08 PM

## 2023-07-12 NOTE — Progress Notes (Signed)
Physical Therapy Session Note  Patient Details  Name: Michael Kidd MRN: [id number] Date of Birth: 25-Jan-1930  Today's Date: 07/12/2023 PT Individual Time: 1050-1115 PT Individual Time Calculation (min): 25 min   Short Term Goals: Week 2:  PT Short Term Goal 1 (Week 2): Patient will perform bed/chair transfer with LRAD and CGA consistently PT Short Term Goal 2 (Week 2): Patient will ambulate >50' with LRAD and CGA consistently PT Short Term Goal 3 (Week 2): Patient will ascend/descend x4 steps with BHR and CGA  Skilled Therapeutic Interventions/Progress Updates:    Chart reviewed and pt agreeable to therapy. Pt received reclined in bed with no c/o pain. Session focused on amb quality and endurance to promote safe home access. Pt initiated session with transfer to EOB using SBA + bed rails + strong VC to promote independence. Pt then completed blocked practice of 100-137ft amb using CGA + RW. Pt noted to require strong VC for safe RW management when turning. Pt also noted to have decreased step length of RLE with increased fatigue that required min VC to reduce risk of LOB.  At end of session, pt was left reclined in bed with alarm engaged, nurse call bell and all needs in reach.     Therapy Documentation Precautions:  Precautions Precautions: Fall Restrictions Weight Bearing Restrictions: No General:       Therapy/Group: Individual Therapy  Dionne Milo, PT, DPT 07/12/2023, 11:28 AM

## 2023-07-12 NOTE — Progress Notes (Signed)
Physical Therapy Session Note  Patient Details  Name: Michael Kidd MRN: [id number] Date of Birth: [birth year]  Today's Date: 07/12/2023 PT Individual Time: 1302-1418 PT Individual Time Calculation (min): 76 min   Short Term Goals: Week 2:  PT Short Term Goal 1 (Week 2): Patient will perform bed/chair transfer with LRAD and CGA consistently PT Short Term Goal 2 (Week 2): Patient will ambulate >50' with LRAD and CGA consistently PT Short Term Goal 3 (Week 2): Patient will ascend/descend x4 steps with BHR and CGA  Skilled Therapeutic Interventions/Progress Updates:    Pt presents in room in bed, reports pleasantly that he is "hurting all over" but agreeable to PT. Session focused on therapeutic exercise for BLE strengthening and muscular endurance, gait training for negotiating obstacles with RW and improving gait mechanics, and NMR for dynamic standing balance.  Pt completes supine to sit EOB with supervision and increased time. Pt completes sit<>stand with min assist increased time, requires assist to prevent posterior LOB with transitioning LUE from mattress to RW.  Pt ambulates with RW, CGA ~90' from room to transition to sitting on nustep. Pt tolerates continuous training on nustep for 10 min total BUE/BLE, verbal cues for maintaining RUE grip on handle for duration of exercise. Pt maintains workload 4 and SPM 29-40 throughout activity. Pt with positive response to activity on Nustep.  Pt then completes gait training for obstacle negotiation and management of device with pt navigating around cones in figure 8 pattern x1 trial. On 2nd attempt pt complaining of dizziness and feeling as though he was going to fall, ambulated backwards to WC min assist to sit. During seated rest break pt begins demonstrating labored breathing and shortness of breath and repeatedly states "I'm giving out." Pt able to return to normal, controlled breathing pattern with redirection. Pt completes seated exercises to  increase blood pressure and promote improved reciprocal movements seated marches x20 requiring AAROM for RLE ROM, MAX verbal/tactile cues for reciprocal LAQs x20 due to poor motor planning. Pt then begins to demonstrate labored breathing and SOB with increased difficulty noted to redirect, therapist retrieves WC from room to transport pt back dependently.  Pt returns to room dependently in [facility], completes stand pivot transfer min assist, bed mobility supervision. Pt vitals reassessed to be WNL however pt then becomes very emotional and requires increased time and therapeutic use of self to redirect to pt current progress and CLOF. Therapist assists pt with calling wife to assist with improving mood. Pt remains supine in bed with all needs within reach, cal light in place and bed alarm activated at end of session.   Vitals  107/57 sitting 97/57 after standing, symptomatic 97/64 after prolonged sitting, TED hose and abdominal binder donned, pt reports slight decrease in dizziness. 95/77, with prolonged sitting and LE therex 112/66, supine, continues to report symptoms  Therapy Documentation Precautions:  Precautions Precautions: Fall Restrictions Weight Bearing Restrictions: No    Therapy/Group: Individual Therapy  Edwin Cap 07/12/2023, 5:05 PM

## 2023-07-12 NOTE — Progress Notes (Signed)
Went to patient's room; he said he was ok. " He wants to go home" per NT v/s was done and WNL. Patient asks for rag to wipe his face. Patient is sitting on side of the bed.

## 2023-07-13 LAB — SARS CORONAVIRUS 2 BY RT PCR: SARS Coronavirus 2 by RT PCR: NEGATIVE

## 2023-07-13 MED ORDER — POLYETHYLENE GLYCOL 3350 17 G PO PACK: 17.0000 g | PACK | Freq: Two times a day (BID) | ORAL | Status: DC

## 2023-07-13 MED ORDER — QUETIAPINE FUMARATE 25 MG PO TABS
12.5000 mg | ORAL_TABLET | Freq: Two times a day (BID) | ORAL | Status: DC | PRN
  Administered 2023-07-13 – 2023-07-18 (×4): 12.5 mg via ORAL
  Filled 2023-07-13 (×4): qty 1

## 2023-07-13 MED ORDER — FLUOXETINE HCL 10 MG PO CAPS: 10.0000 mg | ORAL_CAPSULE | Freq: Every day | ORAL | Status: DC

## 2023-07-13 MED ORDER — SENNOSIDES-DOCUSATE SODIUM 8.6-50 MG PO TABS: 1.0000 | ORAL_TABLET | Freq: Two times a day (BID) | ORAL | Status: DC

## 2023-07-13 NOTE — Progress Notes (Signed)
Occupational Therapy Session Note  Patient Details  Name: DOMETRIUS DUFOUR MRN: [id number] Date of Birth: Mar 11, 1930  {CHL IP REHAB OT TIME CALCULATIONS:[id number]}   Short Term Goals: Week 2:  OT Short Term Goal 1 (Week 2): Patient will complete toilet transfer with min A and LRAD OT Short Term Goal 2 (Week 2): Patient will complete 2/3 toileting tasks with min A OT Short Term Goal 3 (Week 2): Patient will maintain standing balance with min A for 4 minutes in preparation for BADL tasks  Skilled Therapeutic Interventions/Progress Updates:    Patient agreeable to participate in OT session. Reports *** pain level.   Patient participated in skilled OT session focusing on ***. Therapist facilitated/assessed/developed/educated/integrated/elicited *** in order to improve/facilitate/promote    Therapy Documentation Precautions:  Precautions Precautions: Fall Restrictions Weight Bearing Restrictions: No   Therapy/Group: Individual Therapy  Limmie Patricia, OTR/L,CBIS  Supplemental OT - [facility] and [facility] Secure Chat Preferred   07/13/2023, 5:14 PM

## 2023-07-13 NOTE — Progress Notes (Signed)
At 1630 pt began calling to the nurse's station stating that he wanted to go home and to please let him go home. Pt reoriented that they were in the hospital, but pt uncooperative with reorientation. Nurse entered room in order to speak face to face with patient and reassured the patient that they were safe and in the hospital. Pt remained agitated until nurse dialed pt's wife. Pt calm and on the phone with his wife at this moment. Care ongoing.    Rito Ehrlich, LPN

## 2023-07-13 NOTE — Progress Notes (Signed)
PROGRESS NOTE   Subjective/Complaints: No events overnight. BP and HR downtrending today? WNL overall. POs improved but remain limited for fluid.  Last BM 8/5 , large liquid, no incontinence   ROS: Patient denies fever, malaise, abdominal pain,  blurred vision, dizziness, nausea, vomiting, diarrhea, cough, shortness of breath or chest pain, joint or back/neck pain, headache, HA or mood change.   Objective:   No results found. No results for input(s): "WBC", "HGB", "HCT", "PLT" in the last 72 hours.  No results for input(s): "NA", "K", "CL", "CO2", "GLUCOSE", "BUN", "CREATININE", "CALCIUM" in the last 72 hours.    Intake/Output Summary (Last 24 hours) at 07/13/2023 0912 Last data filed at 07/13/2023 7829 Gross per 24 hour  Intake 714 ml  Output 500 ml  Net 214 ml        Physical Exam: Vital Signs Blood pressure 106/68, pulse (!) 56, temperature 97.9 F (36.6 C), resp. rate 18, height 5\' 11"  (1.803 m), weight 93.2 kg, SpO2 95%. 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 Functional deficits which require 3+ hours per day of interdisciplinary therapy in a comprehensive inpatient rehab setting. Physiatrist is providing close team supervision and 24 hour management of active medical problems listed below. Physiatrist and rehab team continue to assess barriers to discharge/monitor patient progress toward functional and medical goals  Care Tool:  Bathing     Body parts bathed by patient: Right arm, Left arm, Chest, Abdomen, Front perineal area, Buttocks, Right upper leg, Right lower leg, Face, Left lower leg, Left upper leg         Bathing assist Assist Level: Moderate Assistance - Patient 50 - 74%     Upper Body Dressing/Undressing Upper body dressing   What is the patient wearing?: Pull over shirt    Upper body assist Assist Level: Minimal Assistance - Patient > 75%    Lower Body Dressing/Undressing Lower body dressing      What is the patient wearing?: Pants     Lower body assist Assist for lower body dressing: Maximal Assistance - Patient 25 - 49%     Toileting Toileting    Toileting assist Assist for toileting: Minimal Assistance - Patient > 75%     Transfers Chair/bed transfer  Transfers assist     Chair/bed transfer assist level: Moderate Assistance - Patient 50 - 74%     Locomotion Ambulation   Ambulation assist      Assist level: Minimal Assistance - Patient > 75% Assistive device: Walker-rolling Max distance: 120   Walk 10 feet activity   Assist     Assist level: Minimal Assistance - Patient > 75% Assistive device: Walker-rolling   Walk 50 feet activity   Assist    Assist level: Minimal  Assistance - Patient > 75% Assistive device: Walker-rolling    Walk 150 feet activity   Assist    Assist level: Minimal Assistance - Patient > 75% Assistive device: Walker-rolling    Walk 10 feet on uneven surface  activity   Assist     Assist level: Minimal Assistance - Patient > 75% Assistive device: Walker-rolling   Wheelchair     Assist Is the patient using a wheelchair?: Yes Type of Wheelchair: Manual    Wheelchair assist level: Dependent - Patient 0% Max wheelchair distance: 150'    Wheelchair 50 feet with 2 turns activity    Assist        Assist Level: Dependent - Patient 0%   Wheelchair 150 feet activity     Assist      Assist Level: Dependent -  Patient 0%   Blood pressure 106/68, pulse (!) 56, temperature 97.9 F (36.6 C), resp. rate 18, height 5\' 11"  (1.803 m), weight 93.2 kg, SpO2 95%.  Medical Problem List and Plan: 1. Functional deficits secondary to ischemic infarction left internal capsule as well as history of prior left brain infarcts             -patient may  shower             -ELOS/Goals: 10-14 days -supervision - Estimated discharge 07/21/23             Continue CIR  -7-26: Report per wife she fell at home and broke her wrist, is currently hospitalized.  No others caregiver support available at home.  - unable to do hands on assist but can do SPV   - 8/6: Min A with OT except when fatigued; will needs hands on care at home. Looking into hired caregivers at home vs ALF. SLP working on orientation and cognition, does well with basic expressions but defaults to "I dont know" with higher cognitive tasks.   2.  Antithrombotics: -DVT/anticoagulation:  Pharmaceutical: Lovenox             -antiplatelet therapy: Aspirin 81 mg daily and Brilinta 90 mg twice daily x 4 weeks then aspirin alone-since failed Plavix 3. Pain Management: Tylenol as needed 4. Mood/Behavior/Sleep: Namenda 10 mg twice daily.  Continue Trazodone 50 mg as needed for sleep.             -antipsychotic agents: Provide emotional support  -Endorsing some very mild passive SI to PT; continue fluoxetine 10 mg daily, scheduled for neuropsych eval - Pt with some lability but affect is otherwise normal endorses good appetite  5. Neuropsych/cognition: This patient is not capable of making decisions on his own behalf.  6. Skin/Wound Care: Routine skin checks  - Xeroform to R forearm  7. Fluids/Electrolytes/Nutrition: Routine and helpful follow-up chemistries  -Mild AKI on admission documentation, encourage p.o. intakes, recheck Monday  -7/27 continue encourage p.o. fluid intake  -728 recheck tomorrow BMP ordered  -7/30 creatinine/BUN up to 26/1.25, will start IVF  normal saline  -8/1 creatinine and BUN improved  -8/2 IVF discontinued, monitor PO intake  -8/5 BUN/Cr stable at 18/1.07  8.  Hypertension.  Norvasc 5 mg daily, lisinopril 20 mg daily.  Monitor with increased mobility  7/30 will stop Norvasc  7/31 DC lisinopril 10mg   8/1 BP stable and appears orthostatic hypotension has improved, continue to monitor  8/5 controlled    07/13/2023    5:30 AM 07/12/2023    9:45 PM 07/12/2023    3:10 PM  Vitals with BMI  Systolic 106 121 161  Diastolic 68 63 75  Pulse 56 77 87     9.  Hyperlipidemia.  Continue Lipitor  10.  BPH/Urinary urgency/frequency/incontinence- suggest timed voiding. Check PVR.  No documented retention  --7/28 remains continent, bladder scan yesterday 122 continue to monitor  -7/31 remains continent, monitor as he is on IVF and will likely need to urinate for frequentyl  11.  Aortic stenosis status post TAVR March 2021 as well as history of CAD with stenting.  Follow-up outpatient with Dr. Gypsy Balsam.  12. Hx of mild dementia- continue Namenda  13.  Orthostatic hypotension  -7/30 IVF normal saline 50 mL per hour and decrease Norvasc.  Abdominal binder and teds  -7/31 DC lisinopril, continue IVF  -8/2 monitor with DC IVF  8/4: BP reviewed and slightly elevated, will not add medication given history of orthostatic hypotension  - 8/6: BP dowentrendiong today; encourage POs , daily orthostats, abdominal binder, TEDs  14. Constipation.   -Continue senokot and PEG, add sorbitol prn  continue miralax to BID  - 8/5: Large liquid Bms overnight, monitor  LOS: 12 days A FACE TO FACE EVALUATION WAS PERFORMED  Michael Kidd 07/13/2023, 9:12 AM

## 2023-07-13 NOTE — Progress Notes (Signed)
Patient ID: Michael Kidd, male   DOB: November 22, 1930, 89 y.o.   MRN: [id number]  1538- SW spoke with pt wife in length about updates from team conference, and d/c date 8/14 and support she will have. Continues to report that her family works and not able to assist. [name] works a lot and travels often. Reiterates her dtr is disabled and unable to assist. She would like SW to pursue SNF. SW discussed SNF placement process and requires insurance approval. Prefers a location close to [employer]. SW will explore options.  SW waiting on updates from Tracy/Admissions with [facility] and Lauren/Admissions with [facility] on bed availability.   Cecile Sheerer, MSW, LCSWA Office: [phone] Cell: [phone] Fax: [phone]

## 2023-07-13 NOTE — Patient Care Conference (Signed)
Inpatient RehabilitationTeam Conference and Plan of Care Update Date: 07/13/2023   Time: 10:42 AM    Patient Name: Michael Kidd      Medical Record Number: [id number]  Date of Birth: 06-27-1930 Sex: Male         Room/Bed: 4W14C/4W14C-01 Payor Info: Payor: [employer] MEDICARE / Plan: [employer] MEDICARE HMO/PPO / Product Type: *No Product type* /    Admit Date/Time:  07/01/2023 12:06 PM  Primary Diagnosis:  Ischemic cerebrovascular accident (CVA) [facility])  Hospital Problems: Principal Problem:   Ischemic cerebrovascular accident (CVA) (HCC) Active Problems:   Vascular dementia with depressed mood (HCC)   Current severe episode of major depressive disorder without psychotic features [facility])    Expected Discharge Date: Expected Discharge Date: 07/21/23  Team Members Present: Physician leading conference: Dr. Elijah Birk Social Worker Present: Cecile Sheerer, LCSWA Nurse Present: Vedia Pereyra, RN PT Present: Karolee Stamps, PT OT Present: Kearney Hard, OT SLP Present: Feliberto Gottron, SLP PPS Coordinator present : Fae Pippin, SLP     Current Status/Progress Goal Weekly Team Focus  Bowel/Bladder   Continent of B/B   Maintain continence   toilet patient qshift and PRN    Swallow/Nutrition/ Hydration               ADL's   Min A overall for sit<>stands and transfers, Min A UB ADLs, Mod A LB ADLs   Supervision - may need to downgrade this week   barrier is family support, as it will likely be mostly his wife, working on self-care retraining, activity tolerance, dc planning    Mobility   minA functional mobility with RW, limited by poor carryover, impaired cognitive, global deficits, emotional lability   Supv with LRAD- May need to be downgraded depending on progress  Barriers: family support? cognitive deficits limit carryover, poor tolerance to OOB. Continue to work on household mobility and transfer training, tolerance to OOB, will need caregiver training as wife is able     Communication   Min A for auditory comprehension and Min-Mod A for use of word-finding strategies and speech intelligibility strategies to improve functional communication   Supervision   increase use of communication strategies in both structured and informal activities.    Safety/Cognition/ Behavioral Observations  Min A for use of external aids for orientation   Supervision   increase use of compensatory strategies    Pain   Patient has reported 0 pain on 0-10 pain scale   maintain pain free   assess pain qshift and PRN    Skin   patient has skin tear on right arm and foam on forehead due to scratching   maintain infection free wounds  assess skin qshift and PRN for s/s of infection      Discharge Planning:  D/c location is undecided. Pt wife has a broken wrist,  and physically unable to assist. She reports there is no family to assist. SW will continue to confirm there are no barriers to discharge.   Team Discussion: CVA. Continent B/B. Encouraging fluids. Mild AKI stable. Orthostatic hypotension. Abdominal binder.  Xeroform and foam to right forearm.  Mood improving. Wife will need to hire help since unable to assist patient. Consistently Min assist for therapy task. He was only one driving PTA.  Patient on target to meet rehab goals: no, may need to downgrade goals to min assist with discharge date of 07/21/23  *See Care Plan and progress notes for long and short-term goals.   Revisions to Treatment Plan:  IV fluids.  Monitor labs/VS Teaching Needs: Medications, safety, self care, skin care, gait/transfer training, etc.   Current Barriers to Discharge: Wound care, Lack of/limited family support, and Behavior  Possible Resolutions to Barriers: Family education Independent with wound care Mood continues to improve Order recommended DME     Medical Summary Current Status: medically complicated by L internal capsule stroke, depression, orthostatic hypotension,  constipation, poor POs, and elevated Cr  Barriers to Discharge: Medical stability;Hypotension;Inadequate Nutritional Intake;Self-care education  Barriers to Discharge Comments: orthostatic hypotension, poor PO intakes Possible Resolutions to [employer] Focus: encourage fluids, monitor orthostats, liberalize BP parameters to encourage perfuision   Continued Need for Acute Rehabilitation Level of Care: The patient requires daily medical management by a physician with specialized training in physical medicine and rehabilitation for the following reasons: Direction of a multidisciplinary physical rehabilitation program to maximize functional independence : Yes Medical management of patient stability for increased activity during participation in an intensive rehabilitation regime.: Yes Analysis of laboratory values and/or radiology reports with any subsequent need for medication adjustment and/or medical intervention. : Yes   I attest that I was present, lead the team conference, and concur with the assessment and plan of the team.   Jearld Adjutant 07/13/2023, 4:07 PM

## 2023-07-13 NOTE — Progress Notes (Signed)
Physical Therapy Session Note  Patient Details  Name: Michael Kidd MRN: [id number] Date of Birth: 1930-06-07  Today's Date: 07/13/2023 PT Individual Time: 5035-4656 PT Individual Time Calculation (min): 73 min   Short Term Goals: Week 2:  PT Short Term Goal 1 (Week 2): Patient will perform bed/chair transfer with LRAD and CGA consistently PT Short Term Goal 2 (Week 2): Patient will ambulate >50' with LRAD and CGA consistently PT Short Term Goal 3 (Week 2): Patient will ascend/descend x4 steps with BHR and CGA  Skilled Therapeutic Interventions/Progress Updates:    Pt presents in room seated EOB, agreeable to PT. Pt says he doesn't "feel good" but he feels "okay" no reports of pain. Session focused on participation self care tasks, tolerance to OOB, therapeutic exerdcise to improve muscular endurance and BLE strength, transfer training, and gait training.  Pt requests to wash face and don shirt, therapist retrieves scrub top with pt declining to don due to wanting therapist's shirt but agreeable to donning hospital gown with min assist. Therapist dons abdominal binder, TED hose, socks with total assist for time management. Pt completes stand pivot transfer with min assist and RW.  Pt transported to main gym, vitals assessed, pt ambulates from gym to nustep 25' with RW CGA, slow gait speed. Pt completes continuous training BUE/BLE , BLE only 2 min with verbal cues to increase speed to 40 SPM, workload 4. Pt reporting L pec pain with use of LUE that he states subsides without use of BUEs.  Pt ambulates back to [facility], verbal cues for increasing gait speed, proximity to RW, and step length with min assist for facilitation.  Pt takes therapeutic rest break with encouragement of fluids, pt has visitor who sits with pt ~5 minutes with pt demonstrating emotional however improved mood following visit from family.  Pt completes 2x5 sit<>stands from WC to RW, verbal cues to slow descent with pt  demonstrating uncontrolled. Pt completes with CGA-min assist.  Pt ambulates 175' with RW CGA mod verbal cues for increasing step length, more significantly for RLE, and proximity to RW. Pt demonstrates improved gait speed initially but slows with fatigue.  Pt transported back to room via WC and transfers to recliner with RW min assist. Pt remains seated in recliner with all needs within reach, call light in place and chair alarm donned and activated at end of session.   Vitals: In sitting prior to activity: BP 96/67 In sitting following nustep: BP 109/68, HR 66 In sitting following sit<>stands and encouraging fluids: BP 119/72, HR 65  Therapy Documentation Precautions:  Precautions Precautions: Fall Restrictions Weight Bearing Restrictions: No   Therapy/Group: Individual Therapy  Edwin Cap PT, DPT 07/13/2023, 12:44 PM

## 2023-07-13 NOTE — Progress Notes (Signed)
Occupational Therapy Session Note  Patient Details  Name: Michael Kidd MRN: [id number] Date of Birth: [birth year]  Today's Date: 07/13/2023 OT Individual Time: 1102-1200 OT Individual Time Calculation (min): 58 min    Short Term Goals: Week 2:  OT Short Term Goal 1 (Week 2): Patient will complete toilet transfer with min A and LRAD OT Short Term Goal 2 (Week 2): Patient will complete 2/3 toileting tasks with min A OT Short Term Goal 3 (Week 2): Patient will maintain standing balance with min A for 4 minutes in preparation for BADL tasks     Skilled Therapeutic Interventions/Progress Updates:    Pt received sitting in recliner. Pt had no c/o pain but stated he wasn't feeling well.  Pt receptive to OT treatment session but declined taking a shower.  Pt was agreeable to go to the sink and perform self care.  Sit>stand from recliner completed with Min A.  Functional mobility to sink~65ft with Min A and use of RW.  Self care completed at sink level.  UB bathing completed with (S) and min verbal cuing for sequencing.  Pt preservative on washing face and needed to be cued to wash the rest of his UB.  Pt able to shave face with Min A.  Pt did nick himself on the L side of his face and below his chin.  OTS resumed shaving to prevent further injury.  UB dressing completed with Min A for threading R arm through shirt and pulling over his head.  Pt declined LB bathing or dressing but wished to go to the bathroom.  Functional mobility to bathroom ~74ft with Min A and use of RW.  Pt able to stand pivot in bathroom with Min A and pt did have loss of balance but was lowered to the toilet.  Pt able to void and pericare completed with Min A and min verbal cuing for sequencing.  Pt required Mod A with pulling up his pants.  Functional mobility back to recliner ~11ft with Min A and RW.  DO entered room during rounds and check in with pt.  Pt became tearful and requested to call his wife.  OTS assisted with phone call.  Pt  in recliner with chair alarm set, call light within reach and all needs met.  Pt continued his conversation with his wife.    Therapy Documentation Precautions:  Precautions Precautions: Fall Restrictions Weight Bearing Restrictions: No    Therapy/Group: Individual Therapy  Liam Graham 07/13/2023, 12:48 PM

## 2023-07-13 NOTE — Progress Notes (Addendum)
Speech Language Pathology Daily Session Note  Patient Details  Name: Michael Kidd MRN: [id number] Date of Birth: [birth year]  Today's Date: 07/13/2023 SLP Individual Time: 1300-1400 SLP Individual Time Calculation (min): 60 min  Short Term Goals: Week 2: SLP Short Term Goal 1 (Week 2): Pt will utilize compensatory word finding strategies to repair communication breakdowns with >75% accuracy given min cues during informal converation at the phrase level. SLP Short Term Goal 2 (Week 2): Patient will complete functional naming tasks with 90% accuracy and supervision level verbal cues. SLP Short Term Goal 3 (Week 2): Pt will follow 2-3 step auditory commands with >75% accuracy given supervision level verbal cues. SLP Short Term Goal 4 (Week 2): Pt will utilize external aids to answer orientation questions with >90% accuracy and supervision level verbal cues for use of external aids. SLP Short Term Goal 5 (Week 2): Patient will improve intelligbility to 90% at the phrase level with Min verbal cues for use of intelligibility strategies.  Skilled Therapeutic Interventions:   Pt greeted in his room. He was awake/alert in his recliner upon SLP arrival. He endorsed a sore throat but reported no pain otherwise. He denied any negative impact on speech or eating, though reduced vocal intensity and slightly increased hoarseness noted today. Pt also appeared more fatigued today and reported "feeling off" to other staff and SLP. BP slightly low (107/62), however, suspect unrelated to other pt complaints. MD notified. During phrase formulation task comparing/contrasting household items, he benefited from modA cues for sentence formulation and only spv cues for functional word finding. Also introduced card task targeting following directions and attention. He benefited from minA cues to follow 2 step directions throughout word finding, card task, and functional task re set up of recliner/TV. He was able to use his TV  guide to ID desired channel w/ modA visual/verbal cues and reduced visual field. SLP provided him w/ external memory aid (sign that he can read from bed) to assist w/ recall of desired channel. He was able to utilize calendar and signs in room to recall orientation information w/ spv verbal cues. He was left in his recliner w/ the chair alarm set and call light within reach. Recommend cont ST.   Pain  No pain except for slight sore throat reported. Denied any need for pain intervention.    Therapy/Group: Individual Therapy  [name] 07/13/2023, 1:20 PM

## 2023-07-14 DIAGNOSIS — I951 Orthostatic hypotension: Secondary | ICD-10-CM | POA: Insufficient documentation

## 2023-07-14 DIAGNOSIS — R45851 Suicidal ideations: Secondary | ICD-10-CM

## 2023-07-14 NOTE — Progress Notes (Signed)
1st Covid test negative. Next Covid test due 07/15/23 at 3pm.

## 2023-07-14 NOTE — Progress Notes (Signed)
PROGRESS NOTE   Subjective/Complaints: Episode of confusion and aggitation overnight. Got 1x PRN seroquel, with benefit.  BP overall improved, vitals stable.  I/Os remain poor.  Patient with no acute complaints today other than "get me out of here if you can".  Is agreeable to continuing therapies.  PT later notes patient endorsing passive depression and comments about being better off dead.  No active SI, no plan.   ROS: Patient denies fever, malaise, abdominal pain,  blurred vision, dizziness, nausea, vomiting, diarrhea, cough, shortness of breath or chest pain, joint or back/neck pain, headache, HA or mood change. + Sore throat  Objective:   No results found. No results for input(s): "WBC", "HGB", "HCT", "PLT" in the last 72 hours.  No results for input(s): "NA", "K", "CL", "CO2", "GLUCOSE", "BUN", "CREATININE", "CALCIUM" in the last 72 hours.    Intake/Output Summary (Last 24 hours) at 07/14/2023 0843 Last data filed at 07/14/2023 0807 Gross per 24 hour  Intake 356 ml  Output 400 ml  Net -44 ml        Physical Exam: Vital Signs Blood pressure 115/65, pulse 66, temperature 98.2 F (36.8 C), temperature source Oral, resp. rate 15, height 5\' 11"  (1.803 m), weight 86.9 kg, SpO2 96%. Gen: no distress, normal appearing, sitting up in wheelchair at sink. HEENT: oral mucosa pink and moist, NCAT Cardio: Reg rate and rhythm, no murmurs rubs or gallops Chest: CTAB, normal effort, normal rate of breathing Abd: soft, non-distended, positive bowel sounds Ext: no edema Psych: pleasant, normal affect; mildly flat Skin: Multiple punctate facial lesions bleeding, likely secondary to shaving.  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  Can get year with options.  speech fluent, cranial nerves II  through XII grossly intact, Follows commands    Assessment/Plan: 1. Functional deficits which require 3+ hours per day of interdisciplinary therapy in a comprehensive inpatient rehab setting. Physiatrist is providing close team supervision and 24 hour management of active medical problems listed below. Physiatrist and rehab team continue to assess barriers to discharge/monitor patient progress toward functional and medical goals  Care Tool:  Bathing    Body parts bathed by patient: Right arm, Left arm, Chest, Abdomen, Front perineal area, Buttocks, Right upper leg, Right lower leg, Face, Left lower leg, Left upper leg         Bathing assist Assist Level: Moderate Assistance - Patient 50 - 74%     Upper Body Dressing/Undressing Upper body dressing   What is the patient wearing?: Pull over shirt    Upper body assist Assist Level: Minimal Assistance - Patient > 75%    Lower Body Dressing/Undressing Lower body dressing      What is the patient wearing?: Pants     Lower body assist Assist for lower body dressing: Maximal Assistance - Patient 25 - 49%     Toileting Toileting    Toileting assist Assist for toileting: Minimal Assistance - Patient > 75%     Transfers Chair/bed transfer  Transfers assist     Chair/bed  transfer assist level: Moderate Assistance - Patient 50 - 74%     Locomotion Ambulation   Ambulation assist      Assist level: Minimal Assistance - Patient > 75% Assistive device: Walker-rolling Max distance: 120   Walk 10 feet activity   Assist     Assist level: Minimal Assistance - Patient > 75% Assistive device: Walker-rolling   Walk 50 feet activity   Assist    Assist level: Minimal Assistance - Patient > 75% Assistive device: Walker-rolling    Walk 150 feet activity   Assist    Assist level: Minimal Assistance - Patient > 75% Assistive device: Walker-rolling    Walk 10 feet on uneven surface  activity   Assist      Assist level: Minimal Assistance - Patient > 75% Assistive device: Walker-rolling   Wheelchair     Assist Is the patient using a wheelchair?: Yes Type of Wheelchair: Manual    Wheelchair assist level: Dependent - Patient 0% Max wheelchair distance: 150'    Wheelchair 50 feet with 2 turns activity    Assist        Assist Level: Dependent - Patient 0%   Wheelchair 150 feet activity     Assist      Assist Level: Dependent - Patient 0%   Blood pressure 115/65, pulse 66, temperature 98.2 F (36.8 C), temperature source Oral, resp. rate 15, height 5\' 11"  (1.803 m), weight 86.9 kg, SpO2 96%.  Medical Problem List and Plan: 1. Functional deficits secondary to ischemic infarction left internal capsule as well as history of prior left brain infarcts             -patient may  shower             -ELOS/Goals: 10-14 days -supervision - Estimated discharge 07/21/23             Continue CIR  -7-26: Report per wife she fell at home and broke her wrist, is currently hospitalized.  No others caregiver support available at home.  - unable to do hands on assist but can do SPV   - 8/6: Min A with OT except when fatigued; will needs hands on care at home. Looking into hired caregivers at home vs ALF. SLP working on orientation and cognition, does well with basic expressions but defaults to "I dont know" with higher cognitive tasks.   2.  Antithrombotics: -DVT/anticoagulation:  Pharmaceutical: Lovenox             -antiplatelet therapy: Aspirin 81 mg daily and Brilinta 90 mg twice daily x 4 weeks then aspirin alone-since failed Plavix 3. Pain Management: Tylenol as needed 4. Mood/Behavior/Sleep: Namenda 10 mg twice daily.  Continue Trazodone 50 mg as needed for sleep.             -antipsychotic agents: Provide emotional support  -Endorsing some very mild passive SI to PT; continue fluoxetine 10 mg daily, scheduled for neuropsych eval - Pt with some lability but affect is  otherwise normal endorses good appetite  5. Neuropsych/cognition: This patient is not capable of making decisions on his own behalf.  6. Skin/Wound Care: Routine skin checks  - Xeroform to R forearm  7. Fluids/Electrolytes/Nutrition: Routine and helpful follow-up chemistries  -Mild AKI on admission documentation, encourage p.o. intakes, recheck Monday  -7/27 continue encourage p.o. fluid intake  -728 recheck tomorrow BMP ordered  -7/30 creatinine/BUN up to 26/1.25, will start IVF normal saline  -8/1 creatinine and BUN improved  -8/2 IVF  discontinued, monitor PO intake  -8/5 BUN/Cr stable at 18/1.07  8.  Hypertension.  Norvasc 5 mg daily, lisinopril 20 mg daily.  Monitor with increased mobility  7/30 will stop Norvasc  7/31 DC lisinopril 10mg   8/1 BP stable and appears orthostatic hypotension has improved, continue to monitor  8/5 controlled    07/14/2023    3:57 AM 07/13/2023    8:08 PM 07/13/2023   12:25 PM  Vitals with BMI  Weight 191 lbs 9 oz    BMI 26.73    Systolic 115 120 371  Diastolic 65 62 78  Pulse 66 67 64     9.  Hyperlipidemia.  Continue Lipitor  10.  BPH/Urinary urgency/frequency/incontinence- suggest timed voiding. Check PVR.  No documented retention  --7/28 remains continent, bladder scan yesterday 122 continue to monitor  -7/31 remains continent, monitor as he is on IVF and will likely need to urinate for frequentyl  11.  Aortic stenosis status post TAVR March 2021 as well as history of CAD with stenting.  Follow-up outpatient with Dr. Gypsy Balsam.  12. Hx of mild dementia- continue Namenda  13.  Orthostatic hypotension  -7/30 IVF normal saline 50 mL per hour and decrease Norvasc.  Abdominal binder and teds  -7/31 DC lisinopril, continue IVF  -8/2 monitor with DC IVF  8/4: BP reviewed and slightly elevated, will not add medication given history of orthostatic hypotension  - 8/6: BP dowentrendiong today; encourage POs , daily orthostats, abdominal  binder, TEDs -some improvement today, monitor  14. Constipation.   -Continue senokot and PEG, add sorbitol prn  continue miralax to BID  - 8/5: Large liquid Bms overnight, monitor  LOS: 13 days A FACE TO FACE EVALUATION WAS PERFORMED  Angelina Sheriff 07/14/2023, 8:43 AM

## 2023-07-14 NOTE — Progress Notes (Signed)
Physical Therapy Session Note  Patient Details  Name: Michael Kidd MRN: [id number] Date of Birth: October 11, 1930  Today's Date: 07/14/2023 PT Individual Time: 1117-1200 PT Individual Time Calculation (min): 43 min   Short Term Goals: Week 2:  PT Short Term Goal 1 (Week 2): Patient will perform bed/chair transfer with LRAD and CGA consistently PT Short Term Goal 2 (Week 2): Patient will ambulate >50' with LRAD and CGA consistently PT Short Term Goal 3 (Week 2): Patient will ascend/descend x4 steps with BHR and CGA  Skilled Therapeutic Interventions/Progress Updates:    Pt presents in room in recliner, agreeable to PT. Pt states "I don't feel bad" when asked about pain. Therapist attempt to improve mood and participation with music however pt reports through session that he would rather not listen. Session focused on therapeutic exercise to promote BLE strengthening and muscle fiber recruitment needed for functional transfers as well as gait training for dynamic postural stability and upright tolerance.  Pt completes therex to promote BLE strengthening and muscle fiber recruitment needed for transfers and upright mobility including: - sit<>stands x5 max verbal cues for hand placement (min assist) - standing marches x20 min assist with RW, posterior lean - mini squats x14  Pt reports variable symptoms with exercises such as "I feel like I'm having a stroke," "I feel like I'm dying," and "I can't breathe." However pt presents at functional baseline, no visual signs of distress, and vitals assessed frequently throughout session to be WNL.  Pt completes forward/backward ambulation 3x6' with RW min assist with pt demonstrating improved sequencing and step length with each trial. Pt requires mod verbal cues for increasing step length and height during exercise.  Pt perseverates on whether or not he has insurance and when he's going home several times during session, requires cueing for  redirection.  Pt ambulates 30' with RW CGA, demonstrating decreased step height with RLE, increased shuffling of gait noted.  Pt returns to sitting in recliner and remains with all needs within reach, cal light in place and chair alarm donned and activated at end of session.    Vitals: In sitting: BP 120/75, HR 67 Following exercises due to pt reporting symptoms: BP 124/73, HR 68, SpO2 98%  Therapy Documentation Precautions:  Precautions Precautions: Fall Restrictions Weight Bearing Restrictions: No    Therapy/Group: Individual Therapy  Edwin Cap PT, DPT 07/14/2023, 12:08 PM

## 2023-07-14 NOTE — Progress Notes (Signed)
Speech Language Pathology Daily Session Note  Patient Details  Name: Michael Kidd MRN: [id number] Date of Birth: 09/20/1930  Today's Date: 07/14/2023 SLP Individual Time: 1430-1530 SLP Individual Time Calculation (min): 60 min  Short Term Goals: Week 2: SLP Short Term Goal 1 (Week 2): Pt will utilize compensatory word finding strategies to repair communication breakdowns with >75% accuracy given min cues during informal converation at the phrase level. SLP Short Term Goal 2 (Week 2): Patient will complete functional naming tasks with 90% accuracy and supervision level verbal cues. SLP Short Term Goal 3 (Week 2): Pt will follow 2-3 step auditory commands with >75% accuracy given supervision level verbal cues. SLP Short Term Goal 4 (Week 2): Pt will utilize external aids to answer orientation questions with >90% accuracy and supervision level verbal cues for use of external aids. SLP Short Term Goal 5 (Week 2): Patient will improve intelligbility to 90% at the phrase level with Min verbal cues for use of intelligibility strategies.  Skilled Therapeutic Interventions:   Pt greeted in his recliner. He was awake/alert upon SLP arrival. Tx tasks targeted speech-language and orientation. He was able to utilize his calendar and other external aids in his room w/ minA verbal cues to verbalize current orientation information. He was assisted to his bed upon request and benefited from minA physical assistance w/ the RW and supervision visual/verbal cues for sequencing and safety awareness. Once back in bed, he required frequent verbal encouragement and Y/N questions to ID wants/needs d/t persistent crying and continued reports c/w "I feel so bad," "I want to die," and "I'm going to die." Unable to ID true thoughts/ideas d/t conflicting responses, though SLP was able to provide verbal encouragement and passive redirection/reorientation. Throughout conversation re current thoughts/feelings, he benefited from  [facility] visual verbal cues for word finding/sentence formulation. He also required modA cues to attempt to correct communication breakdowns in conversation re family/familiar topics. Pt was left in his bed w/ the alarm set and call light within reach. Nurse and MD notified of depressive thoughts/statements.   Pain No pain reported   Therapy/Group: Individual Therapy  [name] 07/14/2023, 4:20 PM

## 2023-07-14 NOTE — Progress Notes (Addendum)
Patient ID: AYUUB SCOMA, male   DOB: [birth year], 89 y.o.   MRN: [id number]  SW waiting on updates from Tracy/Admissions with [facility].  *referral declined  SW left message for Lauren/Admissions with [facility] on bed. *bed offer extended and will submit insurance auth.   SW left message for pt wife informing on above.  *SW spoke with pt wife to discuss above. She is aware there will be follow-up.   Cecile Sheerer, MSW, LCSWA Office: [phone] Cell: [phone] Fax: [phone]

## 2023-07-15 LAB — SARS CORONAVIRUS 2 BY RT PCR: SARS Coronavirus 2 by RT PCR: NEGATIVE

## 2023-07-15 MED ORDER — MELATONIN 3 MG PO TABS
3.0000 mg | ORAL_TABLET | Freq: Every day | ORAL | Status: DC
  Administered 2023-07-15: 3 mg via ORAL
  Filled 2023-07-15: qty 1

## 2023-07-15 MED ORDER — FLUOXETINE HCL 10 MG PO CAPS: 10.0000 mg | ORAL_CAPSULE | Freq: Every day | ORAL | Status: DC

## 2023-07-15 MED ORDER — FLUOXETINE HCL 20 MG PO CAPS
20.0000 mg | ORAL_CAPSULE | Freq: Every day | ORAL | Status: DC
  Administered 2023-07-16 – 2023-07-20 (×5): 20 mg via ORAL
  Filled 2023-07-15 (×5): qty 1

## 2023-07-15 MED ORDER — FLUOXETINE HCL 20 MG PO CAPS: 20.0000 mg | ORAL_CAPSULE | Freq: Every day | ORAL | Status: DC

## 2023-07-15 MED ORDER — TRAZODONE HCL 50 MG PO TABS
25.0000 mg | ORAL_TABLET | Freq: Every evening | ORAL | Status: DC | PRN
  Administered 2023-07-15: 50 mg via ORAL
  Filled 2023-07-15: qty 1

## 2023-07-15 MED ORDER — TRAZODONE HCL 50 MG PO TABS: 50.0000 mg | ORAL_TABLET | Freq: Every day | ORAL | Status: DC

## 2023-07-15 NOTE — Progress Notes (Addendum)
Speech Language Pathology Daily Session Note  Patient Details  Name: ROBBIE DOU MRN: [id number] Date of Birth: [birth year]  Today's Date: 07/15/2023 SLP Individual Time: 1400-1500 SLP Individual Time Calculation (min): 60 min  Short Term Goals: Week 2: SLP Short Term Goal 1 (Week 2): Pt will utilize compensatory word finding strategies to repair communication breakdowns with >75% accuracy given min cues during informal converation at the phrase level. SLP Short Term Goal 2 (Week 2): Patient will complete functional naming tasks with 90% accuracy and supervision level verbal cues. SLP Short Term Goal 3 (Week 2): Pt will follow 2-3 step auditory commands with >75% accuracy given supervision level verbal cues. SLP Short Term Goal 4 (Week 2): Pt will utilize external aids to answer orientation questions with >90% accuracy and supervision level verbal cues for use of external aids. SLP Short Term Goal 5 (Week 2): Patient will improve intelligbility to 90% at the phrase level with Min verbal cues for use of intelligibility strategies.  Skilled Therapeutic Interventions:   Pt greeted in his room, awake and alert in his recliner. Tx tasks targeted speech-language skills and cognition. He benefited from modA visual/verbal cues to utilize his calendar and other external memory aids (signs) in his room to ID orientation information. He appeared in better spirits compared to previous tx session, though did require frequent encouragement to attempt responses vs saying "I don't know" as well as verbal encouragement at the end of the tx session 2* labile episode. He benefited from [employer], phonemic, and sentence completion cues to complete functional responsive naming task re household items. Attempted to initiate gestures as additional strategy to assist during communication breakdown, though pt required max-DEP visual/verbal/tactile cues to provide gestures for common items despite SLP demo. He benefited  from [facility] verbal cues to utilize over articulation and increased volume to maintain 85% speech intelligibility at phrase level. He was left in his recliner w/ the chair alarm set and call light within reach. Recommend cont ST per POC.   Pain Pain Assessment Pain Scale: Faces Faces Pain Scale: No hurt  Therapy/Group: Individual Therapy  [name] 07/15/2023, 3:42 PM

## 2023-07-15 NOTE — Progress Notes (Signed)
PROGRESS NOTE   Subjective/Complaints: No events overnight, no acute complaints. Vital stable, eating 50 to 80% of meals, continent of bowel and bladder, last bowel movement 8-5 Creatinine and BUN mildly elevated 1.17, on chart review p.o.'s remain poor for fluid intakes.  Reported severe depression yesterday, passive SI, wishing to die.  Patient denies these today. Is using trazodone consistently for sleep.  Patient complains of needing to cut his nails today, perseverates on this.  ROS: Difficult due to poor memory and carryover. + Sore throat-resolved  Objective:   No results found. No results for input(s): "WBC", "HGB", "HCT", "PLT" in the last 72 hours.  Recent Labs    07/15/23 0735  NA 142  K 3.9  CL 104  CO2 26  GLUCOSE 106*  BUN 22  CREATININE 1.17  CALCIUM 8.9      Intake/Output Summary (Last 24 hours) at 07/15/2023 1110 Last data filed at 07/15/2023 0900 Gross per 24 hour  Intake 360 ml  Output 550 ml  Net -190 ml        Physical Exam: Vital Signs Blood pressure (!) 126/52, pulse (!) 59, temperature 97.6 F (36.4 C), resp. rate 17, height 5\' 11"  (1.803 m), weight 86.8 kg, SpO2 93%. Gen: no distress, normal appearing, sitting up in bed. HEENT: oral mucosa pink and moist, NCAT Cardio: Reg rate and rhythm, no murmurs rubs or gallops Chest: CTAB, normal effort, normal rate of breathing Abd: soft, non-distended, positive bowel sounds Ext: no edema Psych: pleasant, normal affect; mildly flat Skin: Multiple punctate facial lesions bleeding, nails appear short and well clipped.  Apparent erythema or drainage.  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  Can get year with options.  speech fluent, cranial nerves II through XII grossly intact, Follows  commands    Assessment/Plan: 1. Functional deficits which require 3+ hours per day of interdisciplinary therapy in a comprehensive inpatient rehab setting. Physiatrist is providing close team supervision and 24 hour management of active medical problems listed below. Physiatrist and rehab team continue to assess barriers to discharge/monitor patient progress toward functional and medical goals  Care Tool:  Bathing    Body parts bathed by patient: Right arm, Left arm, Chest, Abdomen, Front perineal area, Buttocks, Right upper leg, Right lower leg, Face, Left lower leg, Left upper leg         Bathing assist Assist Level: Moderate Assistance - Patient 50 - 74%     Upper Body Dressing/Undressing Upper body dressing   What is the patient wearing?: Pull over shirt    Upper body assist Assist Level: Minimal Assistance - Patient > 75%    Lower Body Dressing/Undressing Lower body dressing      What is the patient wearing?: Pants     Lower body assist Assist for lower body dressing: Maximal Assistance - Patient 25 - 49%     Toileting Toileting    Toileting assist Assist for toileting: Minimal Assistance - Patient > 75%     Transfers Chair/bed transfer  Transfers assist  Chair/bed transfer assist level: Moderate Assistance - Patient 50 - 74%     Locomotion Ambulation   Ambulation assist      Assist level: Minimal Assistance - Patient > 75% Assistive device: Walker-rolling Max distance: 120   Walk 10 feet activity   Assist     Assist level: Minimal Assistance - Patient > 75% Assistive device: Walker-rolling   Walk 50 feet activity   Assist    Assist level: Minimal Assistance - Patient > 75% Assistive device: Walker-rolling    Walk 150 feet activity   Assist    Assist level: Minimal Assistance - Patient > 75% Assistive device: Walker-rolling    Walk 10 feet on uneven surface  activity   Assist     Assist level: Minimal Assistance  - Patient > 75% Assistive device: Walker-rolling   Wheelchair     Assist Is the patient using a wheelchair?: Yes Type of Wheelchair: Manual    Wheelchair assist level: Dependent - Patient 0% Max wheelchair distance: 150'    Wheelchair 50 feet with 2 turns activity    Assist        Assist Level: Dependent - Patient 0%   Wheelchair 150 feet activity     Assist      Assist Level: Dependent - Patient 0%   Blood pressure (!) 126/52, pulse (!) 59, temperature 97.6 F (36.4 C), resp. rate 17, height 5\' 11"  (1.803 m), weight 86.8 kg, SpO2 93%.  Medical Problem List and Plan: 1. Functional deficits secondary to ischemic infarction left internal capsule as well as history of prior left brain infarcts             -patient may  shower             -ELOS/Goals: 10-14 days -supervision - Estimated discharge 07/21/23             Continue CIR  -7-26: Report per wife she fell at home and broke her wrist, is currently hospitalized.  No others caregiver support available at home.  - unable to do hands on assist but can do SPV   - 8/6: Min A with OT except when fatigued; will needs hands on care at home. Looking into hired caregivers at home vs ALF. SLP working on orientation and cognition, does well with basic expressions but defaults to "I dont know" with higher cognitive tasks.   2.  Antithrombotics: -DVT/anticoagulation:  Pharmaceutical: Lovenox             -antiplatelet therapy: Aspirin 81 mg daily and Brilinta 90 mg twice daily x 4 weeks then aspirin alone-since failed Plavix 3. Pain Management: Tylenol as needed 4. Mood/Behavior/Sleep: Namenda 10 mg twice daily.  Continue Trazodone 50 mg as needed for sleep.             -antipsychotic agents: Provide emotional support  -Endorsing some very mild passive SI to PT; continue fluoxetine 10 mg daily, scheduled for neuropsych eval - Pt with some lability but affect is otherwise normal endorses good appetite   - 8/8: Increase  Prozac to 20 mg daily for treatment dose for depression.  Is using trazodone consistently nightly, we will schedule nightly melatonin and increase dose to 25 to 50 mg to prevent serotonergic side effects.  5. Neuropsych/cognition: This patient is not capable of making decisions on his own behalf.  6. Skin/Wound Care: Routine skin checks  - Xeroform to R forearm  7. Fluids/Electrolytes/Nutrition: Routine and helpful follow-up chemistries  -Mild AKI on admission  documentation, encourage p.o. intakes, recheck Monday  -7/27 continue encourage p.o. fluid intake  -728 recheck tomorrow BMP ordered  -7/30 creatinine/BUN up to 26/1.25, will start IVF normal saline  -8/1 creatinine and BUN improved  -8/2 IVF discontinued, monitor PO intake  -8/5 BUN/Cr stable at 18/1.07  8.  Hypertension.  Norvasc 5 mg daily, lisinopril 20 mg daily.  Monitor with increased mobility  7/30 will stop Norvasc  7/31 DC lisinopril 10mg   8/1 BP stable and appears orthostatic hypotension has improved, continue to monitor  8/5 controlled    07/15/2023    3:58 AM 07/14/2023    8:19 PM 07/14/2023    2:17 PM  Vitals with BMI  Weight 191 lbs 6 oz    BMI 26.7    Systolic 126 121 409  Diastolic 52 64 77  Pulse 59 64 75     9.  Hyperlipidemia.  Continue Lipitor  10.  BPH/Urinary urgency/frequency/incontinence- suggest timed voiding. Check PVR.  No documented retention  --7/28 remains continent, bladder scan yesterday 122 continue to monitor  -7/31 remains continent, monitor as he is on IVF and will likely need to urinate for frequentyl  Of IVF, stable, monitor.  11.  Aortic stenosis status post TAVR March 2021 as well as history of CAD with stenting.  Follow-up outpatient with Dr. Gypsy Balsam.  12. Hx of mild dementia- continue Namenda  13.  Orthostatic hypotension  -7/30 IVF normal saline 50 mL per hour and decrease Norvasc.  Abdominal binder and teds  -7/31 DC lisinopril, continue IVF  -8/2 monitor with DC  IVF  8/4: BP reviewed and slightly elevated, will not add medication given history of orthostatic hypotension  - 8/6: BP dowentrendiong today; encourage POs , daily orthostats, abdominal binder, TEDs -some improvement today, monitor  14. Constipation.   -Continue senokot and PEG, add sorbitol prn  continue miralax to BID  - 8/5: Large liquid Bms overnight, monitor  LOS: 14 days A FACE TO FACE EVALUATION WAS PERFORMED  Angelina Sheriff 07/15/2023, 11:10 AM

## 2023-07-15 NOTE — Progress Notes (Signed)
Speech Language Pathology Daily Session Note  Patient Details  Name: Michael Kidd MRN: [id number] Date of Birth: [birth year]  Today's Date: 07/15/2023 SLP Individual Time: 2536-6440 SLP Individual Time Calculation (min): 29 min  Short Term Goals: Week 2: SLP Short Term Goal 1 (Week 2): Pt will utilize compensatory word finding strategies to repair communication breakdowns with >75% accuracy given min cues during informal converation at the phrase level. SLP Short Term Goal 2 (Week 2): Patient will complete functional naming tasks with 90% accuracy and supervision level verbal cues. SLP Short Term Goal 3 (Week 2): Pt will follow 2-3 step auditory commands with >75% accuracy given supervision level verbal cues. SLP Short Term Goal 4 (Week 2): Pt will utilize external aids to answer orientation questions with >90% accuracy and supervision level verbal cues for use of external aids. SLP Short Term Goal 5 (Week 2): Patient will improve intelligbility to 90% at the phrase level with Min verbal cues for use of intelligibility strategies.  Skilled Therapeutic Interventions: SLP conducted skilled therapy session targeting speech intelligibility and cognitive retraining. SLP provided mod to maxA to orient to date and day of the week, providing reorientation frequently throughout the session. Patient also required frequent reorientation to date of expected discharge. Patient's voice quality continues to be diminished; SLP provided SPEAK LOUD! Room sign to facilitate improved vocal quality. Patient required reorientation to reason for CIR stay, thus SLP reviewed H&P with patient including events leading up to hospitalization. Patient requested assistance to the bathroom. Per safety plan, patient requires modA, thus RN was notified. Upon conclusion of SLP session, patient was left in transfer with RN. SLP will continue to follow patient per POC.   Pain Pain Assessment Pain Scale: Faces Faces Pain Scale: No  hurt  Therapy/Group: Individual Therapy  Jeannie Done, M.A., CCC-SLP  Yetta Barre 07/15/2023, 12:35 PM

## 2023-07-15 NOTE — Progress Notes (Signed)
Physical Therapy Session Note  Patient Details  Name: Michael Kidd MRN: [id number] Date of Birth: [birth year]  Today's Date: 07/15/2023 PT Individual Time: 1131-1200 PT Individual Time Calculation (min): 29 min   Short Term Goals: Week 2:  PT Short Term Goal 1 (Week 2): Patient will perform bed/chair transfer with LRAD and CGA consistently PT Short Term Goal 2 (Week 2): Patient will ambulate >50' with LRAD and CGA consistently PT Short Term Goal 3 (Week 2): Patient will ascend/descend x4 steps with BHR and CGA  Skilled Therapeutic Interventions/Progress Updates:    Pt presents in room in bed, pt continues to demonstrate poor motivation to participate with therapies but agreeable with encouragement. Pt does not report pain but does report "feeling terrible." Session focused on encouraging participation with self care tasks including facilitation of independence with hygiene, getting dressed, transfers, bed mobility and short distance gait within room. Pt completes sit<>stand transfers with min assist to RW.  Therapist dons TED hose max assist with pt assist by holding up BLEs and rolling in bed with max verbal cues for improved ability to maneuver pants. Pt requesting to complete periarea hygiene and begins removing pants and brief, pt able to remove pants/brief over hips but unable to complete doffing. Pt is able to complete periarea hygiene in sidelying with supervision only.  Pt requires max verbal cues and encouragement for pt to compelte supine to sit with pt stating he needed help, however pt able to manage with supervision with increased time and verbal cues for sequencing. Pt able to thread pants in sitting, therapist assist with donning new brief, total assist for donning shoes, pants and brief donning completed in standing with pt able to use unilateral UE support on RW to pull pants over hips with CGA/min assist for postural stability.  Pt sits for donning abdominal binder (max assist)  and to complete donning shirt, pt requires max verbal cues for sequencing donning shirt with pt demonstrating poor motivation and learned helplessness. Pt able to don shirt with mod assist and max encouragement.  Pt then requesting to wash face, pt encouraged to ambulate to sink to complete hygiene in standing, pt resistant at first but agreeable with encouragement. Pt ambulates with CGA/min assist with short step length, decreased step height, requires cues for increasing proximity to sink to reach handles, requires step by step cues for wetting wash rag, turning on sink, and turning off sink. Pt maintains standing ~3 minutes. Pt then ambulates to recliner and remains seated with all needs within reach, call light in place, and chair alarm donned and activated at end of session.  Therapy Documentation Precautions:  Precautions Precautions: Fall Restrictions Weight Bearing Restrictions: No   Therapy/Group: Individual Therapy  Edwin Cap PT, DPT 07/15/2023, 12:37 PM

## 2023-07-16 DIAGNOSIS — G479 Sleep disorder, unspecified: Secondary | ICD-10-CM

## 2023-07-16 MED ORDER — TRAZODONE HCL 50 MG PO TABS
50.0000 mg | ORAL_TABLET | Freq: Every day | ORAL | Status: DC
  Administered 2023-07-16 – 2023-07-19 (×4): 50 mg via ORAL
  Filled 2023-07-16 (×4): qty 1

## 2023-07-16 NOTE — Progress Notes (Signed)
Occupational Therapy Weekly Progress Note  Patient Details  Name: Michael Kidd MRN: [id number] Date of Birth: 01-30-1930  Beginning of progress report period: July 08, 2023 End of progress report period: July 16, 2023  Today's Date: 07/16/2023 OT Individual Time: 7829-5621 OT Individual Time Calculation (min): 58 min    Patient has met 0 of 3 short term goals.  Pt is progressing toward his STGs, but was not able to meet them fully.   He has had limited participation this past week due to feelings of depression/despair.  Today he was able to participate more and engaged in therapy.   Patient continues to demonstrate the following deficits: muscle weakness, decreased cardiorespiratoy endurance, decreased awareness, decreased problem solving, decreased memory, and delayed processing, and decreased standing balance, decreased postural control, and decreased balance strategies and therefore will continue to benefit from skilled OT intervention to enhance overall performance with BADL.  Patient not progressing toward long term goals.  See goal revision..  Plan of care revisions: LTGs revised to CGA/ min A overall versus supervision.  OT Short Term Goals Week 1:  OT Short Term Goal 1 (Week 1): Patient will complete LB dressing with mod A OT Short Term Goal 1 - Progress (Week 1): Met OT Short Term Goal 2 (Week 1): Patient will recall hemi dressing techniqes with min questioning cues OT Short Term Goal 2 - Progress (Week 1): Met OT Short Term Goal 3 (Week 1): Patient will perform sit<.stand with consistent mod A and LRAD OT Short Term Goal 3 - Progress (Week 1): Met Week 2:  OT Short Term Goal 1 (Week 2): Patient will complete toilet transfer with min A and LRAD OT Short Term Goal 1 - Progress (Week 2): Progressing toward goal OT Short Term Goal 2 (Week 2): Patient will complete 2/3 toileting tasks with min A OT Short Term Goal 2 - Progress (Week 2): Progressing toward goal OT Short Term Goal  3 (Week 2): Patient will maintain standing balance with min A for 4 minutes in preparation for BADL tasks OT Short Term Goal 3 - Progress (Week 2): Progressing toward goal Week 3:  OT Short Term Goal 1 (Week 3): STGs = LTGs  Skilled Therapeutic Interventions/Progress Updates:    Pt received in bed smiling at this therapist.  With encouragement, pt agreeable to a shower. Covered his IV site on R arm. Pt needs multimodal cues to use his body appropriately (ie pushing up with his arms to sit up in bed vs reaching for the therapist).  With cuing, he was able to sit up with min A and transfer to w/c with min A.  He needed more of a mod A to transfer in and out of shower and with subsequent sit to stands with dressing as he hesitated putting weight on his R leg - he would yell out "ouch".  Pt pointed to his R knee.   Once in the shower he was able to bathe himself, including bottom in standing.  Due to size of shower, needed A to reach to his feet.  He was able to don pants over feet with min A and then over hips.  He is very distractible as he is doing self care as he is focused on his R knee pain and worrying about me having to help him. "Oh you should not have to do this!"  Reassured pt I was helping him to become more independent.   Pt was fatigued at end of session, requested  to get back to bed.    Pt resting in bed with all needs met. Alarm set and call light in reach.    Therapy Documentation Precautions:  Precautions Precautions: Fall Restrictions Weight Bearing Restrictions: No   Pain:  pt c/o pain in R knee with sit to stands   ADL: ADL Eating: Set up Grooming: Minimal assistance Upper Body Bathing: Supervision/safety Where Assessed-Upper Body Bathing: Shower Lower Body Bathing: Minimal assistance Where Assessed-Lower Body Bathing: Shower Upper Body Dressing: Supervision/safety Where Assessed-Upper Body Dressing: Wheelchair Lower Body Dressing: Moderate assistance Where  Assessed-Lower Body Dressing: Wheelchair Toileting: Maximal assistance, Moderate assistance Toilet Transfer: Moderate assistance, Maximal assistance Tub/Shower Transfer: Moderate assistance Tub/Shower Transfer Method: Stand pivot Tub/Shower Equipment: Transfer tub bench, Grab bars  Therapy/Group: Individual Therapy  Patsye Sullivant 07/16/2023, 5:33 PM

## 2023-07-16 NOTE — Progress Notes (Signed)
Patient ID: EMANNUEL DICICCO, male   DOB: Jul 06, 1930, 89 y.o.   MRN: [id number]  NCPASRR#[phone] Bertis Ruddy, MSW, LCSWA Office: [phone] Cell: [phone] Fax: [phone]

## 2023-07-16 NOTE — Progress Notes (Signed)
Speech Language Pathology Weekly Progress and Session Note  Patient Details  Name: Michael Kidd MRN: [id number] Date of Birth: [birth year]  Beginning of progress report period: July 09, 2023 End of progress report period: July 16, 2023  Today's Date: 07/16/2023 SLP Individual Time: 0900-1000 SLP Individual Time Calculation (min): 60 min  Short Term Goals: Week 2: SLP Short Term Goal 1 (Week 2): Pt will utilize compensatory word finding strategies to repair communication breakdowns with >75% accuracy given min cues during informal converation at the phrase level. SLP Short Term Goal 1 - Progress (Week 2): Not met SLP Short Term Goal 2 (Week 2): Patient will complete functional naming tasks with 90% accuracy and supervision level verbal cues. SLP Short Term Goal 2 - Progress (Week 2): Not met SLP Short Term Goal 3 (Week 2): Pt will follow 2-3 step auditory commands with >75% accuracy given supervision level verbal cues. SLP Short Term Goal 3 - Progress (Week 2): Not met SLP Short Term Goal 4 (Week 2): Pt will utilize external aids to answer orientation questions with >90% accuracy and supervision level verbal cues for use of external aids. SLP Short Term Goal 4 - Progress (Week 2): Not met SLP Short Term Goal 5 (Week 2): Patient will improve intelligbility to 90% at the phrase level with Min verbal cues for use of intelligibility strategies. SLP Short Term Goal 5 - Progress (Week 2): Not met    New Short Term Goals: Week 3: SLP Short Term Goal 1 (Week 3): Patient will improve intelligbility to 85% at the phrase level with modA verbal cues for use of intelligibility strategies SLP Short Term Goal 2 (Week 3): Pt will complete functional word finding tasks w/ maxA visual/verbal cues SLP Short Term Goal 3 (Week 3): Pt will express basic wants/needs with modA visual/verbal cues SLP Short Term Goal 4 (Week 3): Pt will utilize external memory aids w/ modA visual/verbal cues to verbalize  orientation information  Weekly Progress Updates:  No progress noted this week 2* pt anxiety/depression, self limiting behaviors, and inability to carryover information 2* baseline cognitive deficits. Short term and long term goals downgraded 2* reduced success this week. He continues to fixate on dying and wanting to die; which has resulted in limited to no motivation, self limitations physically and cognitively, and constant cueing to attempt responses during functional and structured cognitive tasks. He benefits from maxA for word finding and speech intelligibility during very simple/functional tasks at this time, max-totalA for tasks of slightly increased complexity. Additionally, he continues to vary from minA-maxA to utilize external aids and verbalize orientation information. He would benefit from continued ST services to target cognitive-linguistic deficits remaining and facilitate improved communication of wants/needs, reduce caregiver burden, and maximize pt independence.    Intensity: Minumum of 1-2 x/day, 30 to 90 minutes Frequency: 1 to 3 out of 7 days Duration/Length of Stay: 8/14 Treatment/Interventions: Cueing hierarchy;Functional tasks;Patient/family education;Therapeutic Activities;Speech/Language facilitation;Internal/external aids;Environmental controls;Cognitive remediation/compensation   Daily Session  Skilled Therapeutic Interventions: Pt and RNT greeted as pt was in the restroom upon SLP arrival. Direct handoff completed and SLP assisted pt w/ completion of toileting. He benefited from modA visual/verbal/tactile cues for sequencing and problem solving during remainder of toileting and transfer to bed via RW. ModA physical assistance required during ambulation as well 2* reduced pt effort and distance to bed. Once back in bed, SLP assisted pt w/ partial bed bath. He benefited from [facility] verbal/tactile cues for problem solving and sequencing for adequate positioning and  set up for  task, but then completed remainder of upper body bathing w/ supervision cues. Limited participation noted after that 2* continued reports of fatigue and apparent lack of motivation. SLP spoke w/ pt's grandson in law who reported that he was also concerned re the pt's mood recently and that he would try to bring photos /familiar items when he visits this weekend. Pt left in bed with alarm set and call light within reach. Recommend cont ST per POC.     Pain  No pain reported.   Therapy/Group: Individual Therapy  [name] 07/16/2023, 7:58 AM

## 2023-07-16 NOTE — Plan of Care (Signed)
Problem: RH Balance Goal: LTG Patient will maintain dynamic standing with ADLs (OT) Description: LTG:  Patient will maintain dynamic standing balance with assist during activities of daily living (OT)  Flowsheets (Taken 07/16/2023 1721) LTG: Pt will maintain dynamic standing balance during ADLs with: (LTG downgraded due to limited activity tolerance and fluctuating cognitive status.) Contact Guard/Touching assist Note: LTG downgraded due to limited activity tolerance and fluctuating cognitive status.    Problem: Sit to Stand Goal: LTG:  Patient will perform sit to stand in prep for activites of daily living with assistance level (OT) Description: LTG:  Patient will perform sit to stand in prep for activites of daily living with assistance level (OT) Flowsheets (Taken 07/16/2023 1721) LTG: PT will perform sit to stand in prep for activites of daily living with assistance level: (LTG downgraded due to limited activity tolerance and fluctuating cognitive status.) Contact Guard/Touching assist Note: LTG downgraded due to limited activity tolerance and fluctuating cognitive status.    Problem: RH Eating Goal: LTG Patient will perform eating w/assist, cues/equip (OT) Description: LTG: Patient will perform eating with assist, with/without cues using equipment (OT) Flowsheets (Taken 07/16/2023 1721) LTG: Pt will perform eating with assistance level of: (LTG downgraded due to limited activity tolerance and fluctuating cognitive status.) Supervision/Verbal cueing Note: LTG downgraded due to limited activity tolerance and fluctuating cognitive status.    Problem: RH Grooming Goal: LTG Patient will perform grooming w/assist,cues/equip (OT) Description: LTG: Patient will perform grooming with assist, with/without cues using equipment (OT) Flowsheets (Taken 07/16/2023 1721) LTG: Pt will perform grooming with assistance level of: (LTG downgraded due to limited activity tolerance and fluctuating cognitive  status.) Supervision/Verbal cueing Note: LTG downgraded due to limited activity tolerance and fluctuating cognitive status.    Problem: RH Bathing Goal: LTG Patient will bathe all body parts with assist levels (OT) Description: LTG: Patient will bathe all body parts with assist levels (OT) Flowsheets (Taken 07/16/2023 1721) LTG: Pt will perform bathing with assistance level/cueing: (LTG downgraded due to limited activity tolerance and fluctuating cognitive status.) Minimal Assistance - Patient > 75% Note: LTG downgraded due to limited activity tolerance and fluctuating cognitive status.    Problem: RH Dressing Goal: LTG Patient will perform lower body dressing w/assist (OT) Description: LTG: Patient will perform lower body dressing with assist, with/without cues in positioning using equipment (OT) Flowsheets (Taken 07/16/2023 1721) LTG: Pt will perform lower body dressing with assistance level of: (LTG downgraded due to limited activity tolerance and fluctuating cognitive status.) Minimal Assistance - Patient > 75% Note: LTG downgraded due to limited activity tolerance and fluctuating cognitive status.    Problem: RH Toileting Goal: LTG Patient will perform toileting task (3/3 steps) with assistance level (OT) Description: LTG: Patient will perform toileting task (3/3 steps) with assistance level (OT)  Flowsheets (Taken 07/16/2023 1721) LTG: Pt will perform toileting task (3/3 steps) with assistance level: (LTG downgraded due to limited activity tolerance and fluctuating cognitive status.) Minimal Assistance - Patient > 75% Note: LTG downgraded due to limited activity tolerance and fluctuating cognitive status.    Problem: RH Functional Use of Upper Extremity Goal: LTG Patient will use RT/LT upper extremity as a (OT) Description: LTG: Patient will use right/left upper extremity as a stabilizer/gross assist/diminished/nondominant/dominant level with assist, with/without cues during functional  activity (OT) Flowsheets Taken 07/16/2023 1721 by Earle Gell, OT LTG: Pt will use upper extremity in functional activity with assistance level of: Supervision/Verbal cueing Taken 07/02/2023 1308 by Doe, Merlene Laughter, OT LTG: Use of  upper extremity in functional activities: RUE as nondominant level   Problem: RH Toilet Transfers Goal: LTG Patient will perform toilet transfers w/assist (OT) Description: LTG: Patient will perform toilet transfers with assist, with/without cues using equipment (OT) Flowsheets (Taken 07/16/2023 1721) LTG: Pt will perform toilet transfers with assistance level of: (LTG downgraded due to limited activity tolerance and fluctuating cognitive status.) Contact Guard/Touching assist Note: LTG downgraded due to limited activity tolerance and fluctuating cognitive status.    Problem: RH Tub/Shower Transfers Goal: LTG Patient will perform tub/shower transfers w/assist (OT) Description: LTG: Patient will perform tub/shower transfers with assist, with/without cues using equipment (OT) Flowsheets (Taken 07/16/2023 1721) LTG: Pt will perform tub/shower stall transfers with assistance level of: (LTG downgraded due to limited activity tolerance and fluctuating cognitive status.) Contact Guard/Touching assist Note: LTG downgraded due to limited activity tolerance and fluctuating cognitive status.    Problem: RH Awareness Goal: LTG: Patient will demonstrate awareness during functional activites type of (OT) Description: LTG: Patient will demonstrate awareness during functional activites type of (OT) Flowsheets (Taken 07/16/2023 1721) Patient will demonstrate awareness during functional activites type of: Intellectual LTG: Patient will demonstrate awareness during functional activites type of (OT): (LTG downgraded due to limited activity tolerance and fluctuating cognitive status.) Minimal Assistance - Patient > 75% Note: LTG downgraded due to limited activity tolerance and  fluctuating cognitive status.

## 2023-07-16 NOTE — Plan of Care (Signed)
Problem: RH Balance Goal: LTG Patient will maintain dynamic standing balance (PT) Description: LTG:  Patient will maintain dynamic standing balance with assistance during mobility activities (PT) 07/16/2023 1621 by Edwin Cap, PT Note: Downgraded due to poor motivation, cognitive deficits lending to decreased carryover between sessions 07/16/2023 1309 by Edwin Cap, PT Flowsheets (Taken 07/16/2023 1256) LTG: Pt will maintain dynamic standing balance during mobility activities with:: (downgraded due to slow progress) Contact Guard/Touching assist Note: Downgraded due to slow progress with decline in motivation for participation, cognitive deficits and poor carryover   Problem: Sit to Stand Goal: LTG:  Patient will perform sit to stand with assistance level (PT) Description: LTG:  Patient will perform sit to stand with assistance level (PT) 07/16/2023 1621 by Edwin Cap, PT Note: Downgraded due to poor motivation, cognitive deficits lending to decreased carryover between sessions 07/16/2023 1309 by Edwin Cap, PT Flowsheets (Taken 07/16/2023 1256) LTG: PT will perform sit to stand in preparation for functional mobility with assistance level: (downgraded due to slow progress) Contact Guard/Touching assist Note: Downgraded due to slow progress with decline in motivation for participation, cognitive deficits and poor carryover   Problem: RH Bed Mobility Goal: LTG Patient will perform bed mobility with assist (PT) Description: LTG: Patient will perform bed mobility with assistance, with/without cues (PT). 07/16/2023 1621 by Edwin Cap, PT Flowsheets (Taken 07/16/2023 1621) LTG: Pt will perform bed mobility with assistance level of: Supervision/Verbal cueing Note: Downgraded due to poor motivation, cognitive deficits lending to decreased carryover between sessions, decreased initiation to complete independently 07/16/2023 1309 by Edwin Cap, PT Flowsheets (Taken 07/16/2023  1256) LTG: Pt will perform bed mobility with assistance level of: (downgraded due to slow progress, poor sequencing) -- Note: Downgraded due to slow progress with decline in motivation for participation, cognitive deficits, poor sequencing and poor carryover   Problem: RH Bed to Chair Transfers Goal: LTG Patient will perform bed/chair transfers w/assist (PT) Description: LTG: Patient will perform bed to chair transfers with assistance (PT). 07/16/2023 1621 by Edwin Cap, PT Flowsheets (Taken 07/16/2023 1621) LTG: Pt will perform Bed to Chair Transfers with assistance level: (downgraded due to slow progress) Contact Guard/Touching assist Note: Downgraded due to poor motivation, cognitive deficits lending to decreased carryover between sessions 07/16/2023 1309 by Edwin Cap, PT Flowsheets (Taken 07/16/2023 1256) LTG: Pt will perform Bed to Chair Transfers with assistance level: Minimal Assistance - Patient > 75%   Problem: RH Car Transfers Goal: LTG Patient will perform car transfers with assist (PT) Description: LTG: Patient will perform car transfers with assistance (PT). Flowsheets (Taken 07/16/2023 1621) LTG: Pt will perform car transfers with assist:: Contact Guard/Touching assist Note: Downgraded due to poor motivation, cognitive deficits lending to decreased carryover between sessions   Problem: RH Furniture Transfers Goal: LTG Patient will perform furniture transfers w/assist (OT/PT) Description: LTG: Patient will perform furniture transfers  with assistance (OT/PT). Flowsheets (Taken 07/16/2023 1621) LTG: Pt will perform furniture transfers with assist:: (downgraded due to slow progress) Minimal Assistance - Patient > 75% Note: Downgraded due to poor motivation, cognitive deficits lending to decreased carryover between sessions   Problem: RH Ambulation Goal: LTG Patient will ambulate in home environment (PT) Description: LTG: Patient will ambulate in home environment, # of feet  with assistance (PT). Flowsheets Taken 07/16/2023 1621 by Edwin Cap, PT LTG: Pt will ambulate in home environ  assist needed:: (downgraded due to slow progress) Contact Guard/Touching assist Taken 07/02/2023 1553 by Rafoth, Sherron Ales, PT LTG: Ambulation distance in home environment: 34' Note: Downgraded  due to poor motivation, cognitive deficits lending to decreased carryover between sessions Goal: LTG Patient will ambulate in community environment (PT) Description: LTG: Patient will ambulate in community environment, # of feet with assistance (PT). Flowsheets Taken 07/16/2023 1621 by Edwin Cap, PT LTG: Pt will ambulate in community environ  assist needed:: (downgraded due to slow progress) Contact Guard/Touching assist Taken 07/02/2023 1553 by Rafoth, Sherron Ales, PT LTG: Ambulation distance in community environment: 150' Note: Downgraded due to poor motivation, cognitive deficits lending to decreased carryover between sessions   Problem: RH Stairs Goal: LTG Patient will ambulate up and down stairs w/assist (PT) Description: LTG: Patient will ambulate up and down # of stairs with assistance (PT) Flowsheets Taken 07/16/2023 1621 by Edwin Cap, PT LTG: Pt will ambulate up/down stairs assist needed:: (downgraded due to slow progress) Minimal Assistance - Patient > 75% Taken 07/02/2023 1553 by Rafoth, Sherron Ales, PT LTG: Pt will  ambulate up and down number of stairs: 4 Note: Downgraded due to poor motivation, cognitive deficits lending to decreased carryover between sessions

## 2023-07-16 NOTE — Progress Notes (Signed)
Physical Therapy Weekly Progress Note  Patient Details  Name: Michael Kidd MRN: [id number] Date of Birth: 01-19-1930  Beginning of progress report period: July 09, 2023 End of progress report period: July 16, 2023  Today's Date: 07/16/2023 PT Individual Time: 730-809 PT Individual Time Calculation: 39 min     Patient has met 0 of 3 short term goals. Pt has been limited by brief isolation status, continued poor motivation to participate,  emotional lability, and cognitive deficits lending to poor carryover between sessions. Pt currently requires min assist for all mobility with max verbal cues for sequencing. Pt ambulates 30-50' with min assist.  Patient continues to demonstrate the following deficits muscle weakness, decreased cardiorespiratoy endurance, impaired timing and sequencing, decreased coordination, and decreased motor planning, decreased attention to right and decreased motor planning, decreased initiation, decreased problem solving, decreased safety awareness, decreased memory, and delayed processing, and decreased standing balance, decreased postural control, and decreased balance strategies and therefore will continue to benefit from skilled PT intervention to increase functional independence with mobility.  Patient not progressing toward long term goals.  See goal revision..  Plan of care revisions: goals downgraded to CGA for mobility.  PT Short Term Goals Week 2:  PT Short Term Goal 1 (Week 2): Patient will perform bed/chair transfer with LRAD and CGA consistently PT Short Term Goal 1 - Progress (Week 2): Progressing toward goal PT Short Term Goal 2 (Week 2): Patient will ambulate >50' with LRAD and CGA consistently PT Short Term Goal 2 - Progress (Week 2): Progressing toward goal PT Short Term Goal 3 (Week 2): Patient will ascend/descend x4 steps with BHR and CGA PT Short Term Goal 3 - Progress (Week 2): Progressing toward goal Week 3:  PT Short Term Goal 1 (Week 3): STG  = LTG due to ELOS  Skilled Therapeutic Interventions/Progress Updates:  Ambulation/gait training;Cognitive remediation/compensation;Discharge planning;DME/adaptive equipment instruction;Functional mobility training;Pain management;Splinting/orthotics;Psychosocial support;Therapeutic Activities;UE/LE Strength taining/ROM;Visual/perceptual remediation/compensation;Balance/vestibular training;Community reintegration;Disease management/prevention;Functional electrical stimulation;Neuromuscular re-education;Patient/family education;Stair training;Therapeutic Exercise;UE/LE Coordination activities;Wheelchair propulsion/positioning   Therapy Documentation Pt presents in room seated EOB, pt denies pain but states he "feels terrible," unable to further explain symptoms. Pt agreeable to PT with encouragement but does state frequently throughout session that he "wants to die" and he is "giving up." Session focused on improving pt participation with self care tasks, therapist therapeutic use of self for motivation participation, and gait training.  Therapist dons shoes in sitting with total assist for time management, pt requires min assist for donning shirt with mod verbal cues for sequencing. Therapist dons abdominal binder mod assist. Pt completes sit<>stand to RW min assist with elevated EOB to RW. Pt requests to put in dentures, requires max encouragement to ambulate to sink to put them in as pt requesting therapist bring to pt. Pt ambulates with min assist, decreased RLE foot clearance and requires max verbal cues for increasing proximity to RW and sink, step by step verbal cues for reaching for dentures to initiate task however pt able to manage putting in dentures without cueing.  Pt then ambulates back to recliner for seated rest break. Pt encouraged to ambulate 31' with RW, therapist self pulling WC in case need for rest break. Pt requires min assist for postural stability due to RLE decreased clearance and  decreased proximity to RW despite max verbal cues and max encouragement to continue ambulating. Pt returns to sitting and transported back to room with significant time devoted to encouraging pt as pt becomes tearful and emotional throughout session.  Pt request to use urinal however once pt standing with urinal in place pt unable to use restroom. Pt completes stand pivot with RW to recliner and remains seated with all needs within reach, call light in place, and chair alarm donned and activated at end of session.  Precautions:  Precautions Precautions: Fall Restrictions Weight Bearing Restrictions: No   Therapy/Group: Individual Therapy  Edwin Cap PT,DPT 07/16/2023, 4:20 PM

## 2023-07-16 NOTE — Plan of Care (Signed)
  Problem: Consults Goal: RH STROKE PATIENT EDUCATION Description: See Patient Education module for education specifics  Outcome: Progressing   Problem: RH BOWEL ELIMINATION Goal: RH STG MANAGE BOWEL WITH ASSISTANCE Description: STG Manage Bowel with min Assistance. Outcome: Progressing   Problem: RH BLADDER ELIMINATION Goal: RH STG MANAGE BLADDER WITH ASSISTANCE Description: STG Manage Bladder With min Assistance Outcome: Progressing Goal: RH STG MANAGE BLADDER WITH MEDICATION WITH ASSISTANCE Description: STG Manage Bladder With Medication With min Assistance. Outcome: Progressing   Problem: RH SAFETY Goal: RH STG ADHERE TO SAFETY PRECAUTIONS W/ASSISTANCE/DEVICE Description: STG Adhere to Safety Precautions With cueing Assistance/Device. Outcome: Progressing Goal: RH STG DECREASED RISK OF FALL WITH ASSISTANCE Description: STG Decreased Risk of Fall With min Assistance. Outcome: Progressing   Problem: RH COGNITION-NURSING Goal: RH STG USES MEMORY AIDS/STRATEGIES W/ASSIST TO PROBLEM SOLVE Description: STG Uses Memory Aids/Strategies With cueing Assistance to Problem Solve. Outcome: Progressing   Problem: RH PAIN MANAGEMENT Goal: RH STG PAIN MANAGED AT OR BELOW PT'S PAIN GOAL Description: Pain will be managed 4 out of 10 on pain scale with PRN medications min assist Outcome: Progressing   Problem: RH KNOWLEDGE DEFICIT Goal: RH STG INCREASE KNOWLEDGE OF HYPERTENSION Description: Patient/caregiver will be able to manage medications and monitor blood pressure from nursing education, nursing handouts, and from other resources independently   Outcome: Progressing Goal: RH STG INCREASE KNOWLEGDE OF HYPERLIPIDEMIA Description: Patient/caregiver will be able to manage medications and improve HDL levels from nursing education, nursing handouts and other resources independently  Outcome: Progressing Goal: RH STG INCREASE KNOWLEDGE OF STROKE PROPHYLAXIS Description: Patient/caregiver  will be able to manage stroke medications (ASA, Brilinta) from nursing education and nursing handouts independently  Outcome: Progressing

## 2023-07-16 NOTE — Progress Notes (Signed)
PROGRESS NOTE   Subjective/Complaints: No events overnight, no acute complaints. Therapies continued to endorse invasive thoughts, patient reporting passive SI.  As patient multiple questions about mood, energy, depression, passive and active suicidal ideations; he continues to thoroughly deny any of these on physician exam.  States he is doing well, no concerns today.  Does state he is getting some poor sleep, no history of insomnia, unsure what is causing this.  ROS: Difficult due to poor memory and carryover. + Sore throat-resolved + Depression-patient denying, endorsing passive SI to other staff  Objective:   No results found. No results for input(s): "WBC", "HGB", "HCT", "PLT" in the last 72 hours.  Recent Labs    07/15/23 0735  NA 142  K 3.9  CL 104  CO2 26  GLUCOSE 106*  BUN 22  CREATININE 1.17  CALCIUM 8.9      Intake/Output Summary (Last 24 hours) at 07/16/2023 1408 Last data filed at 07/16/2023 1348 Gross per 24 hour  Intake 354 ml  Output --  Net 354 ml        Physical Exam: Vital Signs Blood pressure (!) 155/69, pulse (!) 56, temperature 98 F (36.7 C), temperature source Oral, resp. rate 17, height 5\' 11"  (1.803 m), weight 86.8 kg, SpO2 95%. Gen: no distress, normal appearing, laying in bed. HEENT: oral mucosa pink and moist, NCAT Cardio: Reg rate and rhythm, no murmurs rubs or gallops Chest: CTAB, normal effort, normal rate of breathing Abd: soft, non-distended, positive bowel sounds Ext: no edema Psych: pleasant, normal affect; mildly flat Skin: Multiple punctate facial lesions bleeding, nails appear short and well clipped.  Apparent erythema or drainage.  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  Mild dysarthria speech fluent, cranial  nerves II through XII grossly intact, Follows commands    Assessment/Plan: 1. Functional deficits which require 3+ hours per day of interdisciplinary therapy in a comprehensive inpatient rehab setting. Physiatrist is providing close team supervision and 24 hour management of active medical problems listed below. Physiatrist and rehab team continue to assess barriers to discharge/monitor patient progress toward functional and medical goals  Care Tool:  Bathing    Body parts bathed by patient: Right arm, Left arm, Chest, Abdomen, Front perineal area, Buttocks, Right upper leg, Right lower leg, Face, Left lower leg, Left upper leg         Bathing assist Assist Level: Moderate Assistance - Patient 50 - 74%     Upper Body Dressing/Undressing Upper body dressing   What is the patient wearing?: Pull over shirt    Upper body assist Assist Level: Minimal Assistance - Patient > 75%    Lower Body Dressing/Undressing Lower body dressing      What is the patient wearing?: Pants     Lower body assist Assist for lower body dressing: Maximal Assistance - Patient 25 - 49%     Toileting Toileting    Toileting assist Assist for toileting: Minimal Assistance - Patient > 75%     Transfers Chair/bed transfer  Transfers assist     Chair/bed transfer assist level: Moderate Assistance - Patient 50 - 74%     Locomotion Ambulation   Ambulation assist      Assist level: Minimal Assistance - Patient > 75% Assistive device: Walker-rolling Max distance: 120   Walk 10 feet activity   Assist     Assist level: Minimal Assistance - Patient > 75% Assistive device: Walker-rolling   Walk 50 feet activity   Assist    Assist level: Minimal Assistance - Patient > 75% Assistive device: Walker-rolling    Walk 150 feet activity   Assist    Assist level: Minimal Assistance - Patient > 75% Assistive device: Walker-rolling    Walk 10 feet on uneven surface   activity   Assist     Assist level: Minimal Assistance - Patient > 75% Assistive device: Walker-rolling   Wheelchair     Assist Is the patient using a wheelchair?: Yes Type of Wheelchair: Manual    Wheelchair assist level: Dependent - Patient 0% Max wheelchair distance: 150'    Wheelchair 50 feet with 2 turns activity    Assist        Assist Level: Dependent - Patient 0%   Wheelchair 150 feet activity     Assist      Assist Level: Dependent - Patient 0%   Blood pressure (!) 155/69, pulse (!) 56, temperature 98 F (36.7 C), temperature source Oral, resp. rate 17, height 5\' 11"  (1.803 m), weight 86.8 kg, SpO2 95%.  Medical Problem List and Plan: 1. Functional deficits secondary to ischemic infarction left internal capsule as well as history of prior left brain infarcts             -patient may  shower             -ELOS/Goals: 10-14 days -supervision - Estimated discharge 07/21/23             Continue CIR  -7-26: Report per wife she fell at home and broke her wrist, is currently hospitalized.  No others caregiver support available at home.  - unable to do hands on assist but can do SPV   - 8/6: Min A with OT except when fatigued; will needs hands on care at home. Looking into hired caregivers at home vs ALF. SLP working on orientation and cognition, does well with basic expressions but defaults to "I dont know" with higher cognitive tasks.   2.  Antithrombotics: -DVT/anticoagulation:  Pharmaceutical: Lovenox             -antiplatelet therapy: Aspirin 81 mg daily and Brilinta 90 mg twice daily x 4 weeks then aspirin alone-since failed Plavix 3. Pain Management: Tylenol as needed 4. Mood/Behavior/Sleep: Namenda 10 mg twice daily.  Continue Trazodone 50 mg as needed for sleep.             -antipsychotic agents: Provide emotional support  -Endorsing some very mild passive SI to PT; continue fluoxetine 10 mg daily, scheduled for neuropsych eval - Pt with some  lability but affect is otherwise normal endorses good appetite   - 8/8: Increase Prozac to 20 mg daily for treatment dose for depression  8-9: Endorsing poor sleep, DC melatonin as this was not effective, scheduled trazodone 50 mg nightly.  No further medication adjustments due to recent increase in Prozac, monitor for serotonergic symptoms.  Did not feel psychiatry consult is appropriate due to lack of active SI.  Family to bring in photos this weekend.  5.  Neuropsych/cognition: This patient is not capable of making decisions on his own behalf.  6. Skin/Wound Care: Routine skin checks  - Xeroform to R forearm  7. Fluids/Electrolytes/Nutrition: Routine and helpful follow-up chemistries  -Mild AKI on admission documentation, encourage p.o. intakes, recheck Monday  -7/27 continue encourage p.o. fluid intake  -728 recheck tomorrow BMP ordered  -7/30 creatinine/BUN up to 26/1.25, will start IVF normal saline  -8/1 creatinine and BUN improved  -8/2 IVF discontinued, monitor PO intake  -8/5 BUN/Cr stable at 18/1.07  8.  Hypertension.  Norvasc 5 mg daily, lisinopril 20 mg daily.  Monitor with increased mobility  7/30 will stop Norvasc  7/31 DC lisinopril 10mg   8/1 BP stable and appears orthostatic hypotension has improved, continue to monitor  8/5-9 controlled    07/16/2023   12:46 PM 07/16/2023    5:14 AM 07/15/2023    8:31 PM  Vitals with BMI  Systolic 155 127 914  Diastolic 69 69 45  Pulse 56 56 72     9.  Hyperlipidemia.  Continue Lipitor  10.  BPH/Urinary urgency/frequency/incontinence- suggest timed voiding. Check PVR.  No documented retention  --7/28 remains continent, bladder scan yesterday 122 continue to monitor  -7/31 remains continent, monitor as he is on IVF and will likely need to urinate for frequentyl  Of IVF, stable, monitor.  11.  Aortic stenosis status post TAVR March 2021 as well as history of CAD with stenting.  Follow-up outpatient with Dr. Gypsy Balsam.  12.  Hx of mild dementia- continue Namenda  13.  Orthostatic hypotension  -7/30 IVF normal saline 50 mL per hour and decrease Norvasc.  Abdominal binder and teds  -7/31 DC lisinopril, continue IVF  -8/2 monitor with DC IVF  8/4: BP reviewed and slightly elevated, will not add medication given history of orthostatic hypotension  - 8/6: BP dowentrendiong today; encourage POs , daily orthostats, abdominal binder, TEDs -some improvement today, monitor  -improving, monitor  14. Constipation.   -Continue senokot and PEG, add sorbitol prn  continue miralax to BID  - 8/8: Last bowel movement, medium  LOS: 15 days A FACE TO FACE EVALUATION WAS PERFORMED  Michael Kidd 07/16/2023, 2:08 PM

## 2023-07-16 NOTE — Plan of Care (Signed)
  Problem: RH Comprehension Communication Goal: LTG Patient will comprehend basic/complex auditory (SLP) Description: LTG: Patient will comprehend basic/complex auditory information with cues (SLP). 07/16/2023 1739 by [name], CCC-SLP Outcome: Not Met (add Reason) Note: D/c 8/9 07/16/2023 1721 by [name], CCC-SLP Outcome: Not Met (add Reason) Note: D/c goal 07/16/23   Problem: RH Cognition - SLP Goal: RH LTG Patient will demonstrate orientation with cues Description:  LTG:  Patient will demonstrate orientation to person/place/time/situation with cues (SLP)   Flowsheets (Taken 07/16/2023 1719) LTG Patient will demonstrate orientation to:  Person  Place  Time LTG: Patient will demonstrate orientation using cueing (SLP): Minimal Assistance - Patient > 75% Note: W/ use of external memory aids   Problem: RH Expression Communication Goal: LTG Patient will increase speech intelligibility (SLP) Description: LTG: Patient will increase speech intelligibility at word/phrase/conversation level with cues, % of the time (SLP) 07/16/2023 1741 by [name], CCC-SLP Flowsheets (Taken 07/16/2023 1719) LTG: Patient will increase speech intelligibility (SLP): Moderate Assistance - Patient 50 - 74% 07/16/2023 1739 by [name], CCC-SLP Note: At short sentence level 07/16/2023 1721 by [name], CCC-SLP Flowsheets (Taken 07/16/2023 1719) LTG: Patient will increase speech intelligibility (SLP): Moderate Assistance - Patient 50 - 74% Note: Sentence level  Goal: LTG Patient will increase word finding of common (SLP) Description: LTG:  Patient will increase word finding of common objects/daily info/abstract thoughts with cues using compensatory strategies (SLP). Flowsheets (Taken 07/16/2023 1739) LTG: Patient will increase word finding of common (SLP): Moderate Assistance - Patient 50 - 74% Patient will use compensatory strategies to increase word finding of: Common objects Note: Basic  wants/needs

## 2023-07-16 NOTE — Progress Notes (Signed)
Patient ID: Michael Kidd, male   DOB: 09-13-1930, 89 y.o.   MRN: [id number]  SW spoke with Lauren/Admissions with [facility] to inquire about updates on insurance auth. Reports it remains in pending status.   Cecile Sheerer, MSW, LCSWA Office: [phone] Cell: [phone] Fax: [phone]

## 2023-07-17 DIAGNOSIS — I69322 Dysarthria following cerebral infarction: Secondary | ICD-10-CM

## 2023-07-17 NOTE — Plan of Care (Signed)
  Problem: Consults Goal: RH STROKE PATIENT EDUCATION Description: See Patient Education module for education specifics  Outcome: Progressing   Problem: RH BOWEL ELIMINATION Goal: RH STG MANAGE BOWEL WITH ASSISTANCE Description: STG Manage Bowel with min Assistance. Outcome: Progressing   Problem: RH BLADDER ELIMINATION Goal: RH STG MANAGE BLADDER WITH ASSISTANCE Description: STG Manage Bladder With min Assistance Outcome: Progressing Goal: RH STG MANAGE BLADDER WITH MEDICATION WITH ASSISTANCE Description: STG Manage Bladder With Medication With min Assistance. Outcome: Progressing   Problem: RH SAFETY Goal: RH STG ADHERE TO SAFETY PRECAUTIONS W/ASSISTANCE/DEVICE Description: STG Adhere to Safety Precautions With cueing Assistance/Device. Outcome: Progressing Goal: RH STG DECREASED RISK OF FALL WITH ASSISTANCE Description: STG Decreased Risk of Fall With min Assistance. Outcome: Progressing   Problem: RH COGNITION-NURSING Goal: RH STG USES MEMORY AIDS/STRATEGIES W/ASSIST TO PROBLEM SOLVE Description: STG Uses Memory Aids/Strategies With cueing Assistance to Problem Solve. Outcome: Progressing   Problem: RH PAIN MANAGEMENT Goal: RH STG PAIN MANAGED AT OR BELOW PT'S PAIN GOAL Description: Pain will be managed 4 out of 10 on pain scale with PRN medications min assist Outcome: Progressing   Problem: RH KNOWLEDGE DEFICIT Goal: RH STG INCREASE KNOWLEDGE OF HYPERTENSION Description: Patient/caregiver will be able to manage medications and monitor blood pressure from nursing education, nursing handouts, and from other resources independently   Outcome: Progressing Goal: RH STG INCREASE KNOWLEGDE OF HYPERLIPIDEMIA Description: Patient/caregiver will be able to manage medications and improve HDL levels from nursing education, nursing handouts and other resources independently  Outcome: Progressing Goal: RH STG INCREASE KNOWLEDGE OF STROKE PROPHYLAXIS Description: Patient/caregiver  will be able to manage stroke medications (ASA, Brilinta) from nursing education and nursing handouts independently  Outcome: Progressing

## 2023-07-17 NOTE — Progress Notes (Signed)
PROGRESS NOTE   Subjective/Complaints:  Denies any issues-  Denies SOB, LBM yesterday Ball in R hand as woke up   ROS: limited due to not wanting to wake up this AM + Sore throat-resolved + Depression-patient denying, endorsing passive SI to other staff  Objective:   No results found. No results for input(s): "WBC", "HGB", "HCT", "PLT" in the last 72 hours.  Recent Labs    07/15/23 0735  NA 142  K 3.9  CL 104  CO2 26  GLUCOSE 106*  BUN 22  CREATININE 1.17  CALCIUM 8.9      Intake/Output Summary (Last 24 hours) at 07/17/2023 1615 Last data filed at 07/17/2023 1301 Gross per 24 hour  Intake 716 ml  Output 500 ml  Net 216 ml        Physical Exam: Vital Signs Blood pressure 118/65, pulse 74, temperature 97.7 F (36.5 C), temperature source Oral, resp. rate 18, height 5\' 11"  (1.803 m), weight 86.8 kg, SpO2 97%.    General: awake, alert, appropriate, just woke up briefly- supine; NAD HENT: conjugate gaze; oropharynx moist CV: regular rate and rhythm; no JVD Pulmonary: CTA B/L; no W/R/R- good air movement GI: soft, NT, ND, (+)BS- normoactive Psychiatric: flat, depressed affect- but also sleepy Neurological: alert, but sleepy  Psych: pleasant, normal affect; mildly flat Skin: Multiple punctate facial lesions bleeding, nails appear short and well clipped.  Apparent erythema or drainage.  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  Mild dysarthria speech fluent, cranial nerves II through XII grossly intact, Follows commands    Assessment/Plan: 1. Functional deficits which require 3+ hours per day of interdisciplinary therapy in a comprehensive inpatient rehab setting. Physiatrist is providing close team supervision and 24 hour management of active  medical problems listed below. Physiatrist and rehab team continue to assess barriers to discharge/monitor patient progress toward functional and medical goals  Care Tool:  Bathing    Body parts bathed by patient: Right arm, Left arm, Chest, Abdomen, Front perineal area, Buttocks, Right upper leg, Face, Left upper leg   Body parts bathed by helper: Right lower leg, Left lower leg     Bathing assist Assist Level: Minimal Assistance - Patient > 75%     Upper Body Dressing/Undressing Upper body dressing   What is the patient wearing?: Pull over shirt    Upper body assist Assist Level: Supervision/Verbal cueing    Lower Body Dressing/Undressing Lower body dressing      What is the patient wearing?: Underwear/pull up, Pants     Lower body assist Assist for lower body dressing: Minimal Assistance - Patient > 75%     Toileting Toileting    Toileting assist Assist for toileting: Minimal Assistance - Patient > 75%     Transfers Chair/bed transfer  Transfers assist     Chair/bed transfer assist level: Minimal Assistance - Patient > 75%     Locomotion Ambulation   Ambulation assist      Assist level: Minimal Assistance - Patient > 75% Assistive device:  Walker-rolling Max distance: 50'   Walk 10 feet activity   Assist     Assist level: Minimal Assistance - Patient > 75% Assistive device: Walker-rolling   Walk 50 feet activity   Assist    Assist level: Minimal Assistance - Patient > 75% Assistive device: Walker-rolling    Walk 150 feet activity   Assist    Assist level: Minimal Assistance - Patient > 75% Assistive device: Walker-rolling    Walk 10 feet on uneven surface  activity   Assist     Assist level: Minimal Assistance - Patient > 75% Assistive device: Walker-rolling   Wheelchair     Assist Is the patient using a wheelchair?: Yes Type of Wheelchair: Manual    Wheelchair assist level: Dependent - Patient 0% Max  wheelchair distance: 150'    Wheelchair 50 feet with 2 turns activity    Assist        Assist Level: Dependent - Patient 0%   Wheelchair 150 feet activity     Assist      Assist Level: Dependent - Patient 0%   Blood pressure 118/65, pulse 74, temperature 97.7 F (36.5 C), temperature source Oral, resp. rate 18, height 5\' 11"  (1.803 m), weight 86.8 kg, SpO2 97%.  Medical Problem List and Plan: 1. Functional deficits secondary to ischemic infarction left internal capsule as well as history of prior left brain infarcts             -patient may  shower             -ELOS/Goals: 10-14 days -supervision - Estimated discharge 07/21/23             Continue CIR  -7-26: Report per wife she fell at home and broke her wrist, is currently hospitalized.  No others caregiver support available at home.  - unable to do hands on assist but can do SPV   - 8/6: Min A with OT except when fatigued; will needs hands on care at home. Looking into hired caregivers at home vs ALF. SLP working on orientation and cognition, does well with basic expressions but defaults to "I dont know" with higher cognitive tasks.   Con't CIR PT, OT and SLP- so tired this AM- wouldn't engage.  2.  Antithrombotics: -DVT/anticoagulation:  Pharmaceutical: Lovenox             -antiplatelet therapy: Aspirin 81 mg daily and Brilinta 90 mg twice daily x 4 weeks then aspirin alone-since failed Plavix 3. Pain Management: Tylenol as needed 4. Mood/Behavior/Sleep: Namenda 10 mg twice daily.  Continue Trazodone 50 mg as needed for sleep.             -antipsychotic agents: Provide emotional support  -Endorsing some very mild passive SI to PT; continue fluoxetine 10 mg daily, scheduled for neuropsych eval - Pt with some lability but affect is otherwise normal endorses good appetite   - 8/8: Increase Prozac to 20 mg daily for treatment dose for depression  8-9: Endorsing poor sleep, DC melatonin as this was not effective, scheduled  trazodone 50 mg nightly.  No further medication adjustments due to recent increase in Prozac, monitor for serotonergic symptoms.  Did not feel psychiatry consult is appropriate due to lack of active SI.  Family to bring in photos this weekend.  5. Neuropsych/cognition: This patient is not capable of making decisions on his own behalf.  6. Skin/Wound Care: Routine skin checks  - Xeroform to R forearm  8/10- skin tears healed-  con't to monitor skin  7. Fluids/Electrolytes/Nutrition: Routine and helpful follow-up chemistries  -Mild AKI on admission documentation, encourage p.o. intakes, recheck Monday  -7/27 continue encourage p.o. fluid intake  -728 recheck tomorrow BMP ordered  -7/30 creatinine/BUN up to 26/1.25, will start IVF normal saline  -8/1 creatinine and BUN improved  -8/2 IVF discontinued, monitor PO intake  -8/5 BUN/Cr stable at 18/1.07  8.  Hypertension.  Norvasc 5 mg daily, lisinopril 20 mg daily.  Monitor with increased mobility  7/30 will stop Norvasc  7/31 DC lisinopril 10mg   8/1 BP stable and appears orthostatic hypotension has improved, continue to monitor  8/10- BP controlled- cont' regimen    07/17/2023    1:39 PM 07/17/2023    7:25 AM 07/17/2023    5:28 AM  Vitals with BMI  Systolic 118 103 161  Diastolic 65 72 138  Pulse 74 80 58     9.  Hyperlipidemia.  Continue Lipitor  10.  BPH/Urinary urgency/frequency/incontinence- suggest timed voiding. Check PVR.  No documented retention  --7/28 remains continent, bladder scan yesterday 122 continue to monitor  -7/31 remains continent, monitor as he is on IVF and will likely need to urinate for frequentyl  Of IVF, stable, monitor.  11.  Aortic stenosis status post TAVR March 2021 as well as history of CAD with stenting.  Follow-up outpatient with Dr. Gypsy Balsam.  12. Hx of mild dementia- continue Namenda  13.  Orthostatic hypotension  -7/30 IVF normal saline 50 mL per hour and decrease Norvasc.  Abdominal  binder and teds  -7/31 DC lisinopril, continue IVF  -8/2 monitor with DC IVF  8/4: BP reviewed and slightly elevated, will not add medication given history of orthostatic hypotension  - 8/6: BP dowentrendiong today; encourage POs , daily orthostats, abdominal binder, TEDs -some improvement today, monitor  -improving, monitor  14. Constipation.   -Continue senokot and PEG, add sorbitol prn  continue miralax to BID  - 8/8: Last bowel movement, medium  8/10- LBM yesterday  I spent a total of 36   minutes on total care today- >50% coordination of care- due to  Review of chart and discussion with nursing about skin and depression  LOS: 16 days A FACE TO FACE EVALUATION WAS PERFORMED  Yannis Gumbs 07/17/2023, 4:15 PM

## 2023-07-17 NOTE — Progress Notes (Signed)
Skin tears to right arm healed, notified assigned nurse and Dr. Berline Chough.    Tilden Dome, LPN

## 2023-07-18 LAB — CUP PACEART REMOTE DEVICE CHECK
Date Time Interrogation Session: 20240810230555
Implantable Pulse Generator Implant Date: 20200921

## 2023-07-18 NOTE — Progress Notes (Signed)
PROGRESS NOTE   Subjective/Complaints:  Just woke up- still has  Ball in R hand as woke up Pt reports not feeling good- not sure why in hospital- forgot had stroke-  Denies SOB, N/V, doesn't remember LBM, but not sure if constipated.      ROS: limited due to cognition/depression + Sore throat-resolved + Depression-patient denying, endorsing passive SI to other staff  Objective:   No results found. No results for input(s): "WBC", "HGB", "HCT", "PLT" in the last 72 hours.  No results for input(s): "NA", "K", "CL", "CO2", "GLUCOSE", "BUN", "CREATININE", "CALCIUM" in the last 72 hours.     Intake/Output Summary (Last 24 hours) at 07/18/2023 1505 Last data filed at 07/18/2023 1430 Gross per 24 hour  Intake 596 ml  Output 250 ml  Net 346 ml        Physical Exam: Vital Signs Blood pressure 135/61, pulse 73, temperature 97.9 F (36.6 C), temperature source Oral, resp. rate 16, height 5\' 11"  (1.803 m), weight 86.8 kg, SpO2 93%.     General: awake, alert, calm- sleepy- holding ball in R hand; NAD HENT:  oropharynx moist CV: regular rate and rhythm; no JVD Pulmonary: CTA B/L; no W/R/R- good air movement GI: soft, NT, ND, (+)BS Psychiatric: sleepy, but calm- just depressed affect Neurological: doesn't know why in hospital- upset when heard had stroke Psych: pleasant, normal affect; mildly flat Skin: Multiple punctate facial lesions bleeding, nails appear short and well clipped.  Apparent erythema or drainage.  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  Mild dysarthria speech fluent, cranial nerves II through XII grossly intact, Follows commands    Assessment/Plan: 1. Functional deficits which require 3+ hours per day of interdisciplinary therapy in a  comprehensive inpatient rehab setting. Physiatrist is providing close team supervision and 24 hour management of active medical problems listed below. Physiatrist and rehab team continue to assess barriers to discharge/monitor patient progress toward functional and medical goals  Care Tool:  Bathing    Body parts bathed by patient: Right arm, Left arm, Chest, Abdomen, Front perineal area, Buttocks, Right upper leg, Face, Left upper leg   Body parts bathed by helper: Right lower leg, Left lower leg     Bathing assist Assist Level: Minimal Assistance - Patient > 75%     Upper Body Dressing/Undressing Upper body dressing   What is the patient wearing?: Pull over shirt    Upper body assist Assist Level: Supervision/Verbal cueing    Lower Body Dressing/Undressing Lower body dressing      What is the patient wearing?: Underwear/pull up, Pants     Lower body assist Assist for lower body dressing: Minimal Assistance - Patient > 75%     Toileting Toileting    Toileting assist Assist for toileting: Minimal Assistance - Patient > 75%     Transfers Chair/bed transfer  Transfers assist     Chair/bed transfer assist level: Minimal Assistance - Patient > 75%     Locomotion Ambulation   Ambulation assist  Assist level: Minimal Assistance - Patient > 75% Assistive device: Walker-rolling Max distance: 50'   Walk 10 feet activity   Assist     Assist level: Minimal Assistance - Patient > 75% Assistive device: Walker-rolling   Walk 50 feet activity   Assist    Assist level: Minimal Assistance - Patient > 75% Assistive device: Walker-rolling    Walk 150 feet activity   Assist    Assist level: Minimal Assistance - Patient > 75% Assistive device: Walker-rolling    Walk 10 feet on uneven surface  activity   Assist     Assist level: Minimal Assistance - Patient > 75% Assistive device: Walker-rolling   Wheelchair     Assist Is the patient  using a wheelchair?: Yes Type of Wheelchair: Manual    Wheelchair assist level: Dependent - Patient 0% Max wheelchair distance: 150'    Wheelchair 50 feet with 2 turns activity    Assist        Assist Level: Dependent - Patient 0%   Wheelchair 150 feet activity     Assist      Assist Level: Dependent - Patient 0%   Blood pressure 135/61, pulse 73, temperature 97.9 F (36.6 C), temperature source Oral, resp. rate 16, height 5\' 11"  (1.803 m), weight 86.8 kg, SpO2 93%.  Medical Problem List and Plan: 1. Functional deficits secondary to ischemic infarction left internal capsule as well as history of prior left brain infarcts             -patient may  shower             -ELOS/Goals: 10-14 days -supervision - Estimated discharge 07/21/23             Continue CIR  -7-26: Report per wife she fell at home and broke her wrist, is currently hospitalized.  No others caregiver support available at home.  - unable to do hands on assist but can do SPV   - 8/6: Min A with OT except when fatigued; will needs hands on care at home. Looking into hired caregivers at home vs ALF. SLP working on orientation and cognition, does well with basic expressions but defaults to "I dont know" with higher cognitive tasks.   Con't CIR PT, OT and SLP- still doesn't want to engege today- forgot why here.   2.  Antithrombotics: -DVT/anticoagulation:  Pharmaceutical: Lovenox             -antiplatelet therapy: Aspirin 81 mg daily and Brilinta 90 mg twice daily x 4 weeks then aspirin alone-since failed Plavix 3. Pain Management: Tylenol as needed 4. Mood/Behavior/Sleep: Namenda 10 mg twice daily.  Continue Trazodone 50 mg as needed for sleep.             -antipsychotic agents: Provide emotional support  -Endorsing some very mild passive SI to PT; continue fluoxetine 10 mg daily, scheduled for neuropsych eval - Pt with some lability but affect is otherwise normal endorses good appetite   - 8/8: Increase  Prozac to 20 mg daily for treatment dose for depression  8-9: Endorsing poor sleep, DC melatonin as this was not effective, scheduled trazodone 50 mg nightly.  No further medication adjustments due to recent increase in Prozac, monitor for serotonergic symptoms.  Did not feel psychiatry consult is appropriate due to lack of active SI.  Family to bring in photos this weekend.  8/11- not sure how much is cognition and how much depression? 5. Neuropsych/cognition: This patient is not capable  of making decisions on his own behalf.  6. Skin/Wound Care: Routine skin checks  - Xeroform to R forearm  8/10- skin tears healed- con't to monitor skin  7. Fluids/Electrolytes/Nutrition: Routine and helpful follow-up chemistries  -Mild AKI on admission documentation, encourage p.o. intakes, recheck Monday  -7/27 continue encourage p.o. fluid intake  -728 recheck tomorrow BMP ordered  -7/30 creatinine/BUN up to 26/1.25, will start IVF normal saline  -8/1 creatinine and BUN improved  -8/2 IVF discontinued, monitor PO intake  -8/5 BUN/Cr stable at 18/1.07  8/11- labs Monday - ordered for AM 8.  Hypertension.  Norvasc 5 mg daily, lisinopril 20 mg daily.  Monitor with increased mobility  7/30 will stop Norvasc  7/31 DC lisinopril 10mg   8/1 BP stable and appears orthostatic hypotension has improved, continue to monitor  8/10-8/11 BP controlled- cont' regimen    07/17/2023    7:55 PM 07/17/2023    1:39 PM 07/17/2023    7:25 AM  Vitals with BMI  Systolic 135 118 161  Diastolic 61 65 72  Pulse 73 74 80     9.  Hyperlipidemia.  Continue Lipitor  10.  BPH/Urinary urgency/frequency/incontinence- suggest timed voiding. Check PVR.  No documented retention  --7/28 remains continent, bladder scan yesterday 122 continue to monitor  -7/31 remains continent, monitor as he is on IVF and will likely need to urinate for frequentyl  Off IVF, stable, monitor.  11.  Aortic stenosis status post TAVR March 2021 as well  as history of CAD with stenting.  Follow-up outpatient with Dr. Gypsy Balsam.  12. Hx of mild dementia- continue Namenda  13.  Orthostatic hypotension  -7/30 IVF normal saline 50 mL per hour and decrease Norvasc.  Abdominal binder and teds  -7/31 DC lisinopril, continue IVF  -8/2 monitor with DC IVF  8/4: BP reviewed and slightly elevated, will not add medication given history of orthostatic hypotension  - 8/6: BP dowentrendiong today; encourage POs , daily orthostats, abdominal binder, TEDs -some improvement today, monitor  -improving, monitor  14. Constipation.   -Continue senokot and PEG, add sorbitol prn  continue miralax to BID  - 8/8: Last bowel movement, medium  8/10- LBM yesterday  8/11- LBM yesterday   I spent a total of  35  minutes on total care today- >50% coordination of care- due to  D/w pt about stroke and therapy about depression vs cognition  LOS: 17 days A FACE TO FACE EVALUATION WAS PERFORMED  Delitha Elms 07/18/2023, 3:05 PM

## 2023-07-18 NOTE — Progress Notes (Signed)
Occupational Therapy Session Note  Patient Details  Name: MARVYN CROSSAN MRN: [id number] Date of Birth: 1929-12-31  {CHL IP REHAB OT TIME CALCULATIONS:[id number]}   Short Term Goals: Week 3:  OT Short Term Goal 1 (Week 3): STGs = LTGs  Skilled Therapeutic Interventions/Progress Updates:    Patient agreeable to participate in OT session. Reports *** pain level.   Patient participated in skilled OT session focusing on ***. Therapist facilitated/assessed/developed/educated/integrated/elicited *** in order to improve/facilitate/promote    Therapy Documentation Precautions:  Precautions Precautions: Fall Restrictions Weight Bearing Restrictions: No    Therapy/Group: Individual Therapy  Limmie Patricia, OTR/L,CBIS  Supplemental OT - [facility] and [facility] Secure Chat Preferred   07/18/2023, 7:37 PM

## 2023-07-19 ENCOUNTER — Ambulatory Visit: Payer: Medicare HMO

## 2023-07-19 DIAGNOSIS — I63412 Cerebral infarction due to embolism of left middle cerebral artery: Secondary | ICD-10-CM | POA: Diagnosis not present

## 2023-07-19 MED ORDER — DICLOFENAC SODIUM 1 % EX GEL: 4.0000 g | Freq: Four times a day (QID) | CUTANEOUS | Status: DC

## 2023-07-19 MED ORDER — FLUOXETINE HCL 20 MG PO CAPS: 20.0000 mg | ORAL_CAPSULE | Freq: Every day | ORAL | Status: DC

## 2023-07-19 MED ORDER — TRAZODONE HCL 50 MG PO TABS: 50.0000 mg | ORAL_TABLET | Freq: Every day | ORAL | Status: DC

## 2023-07-19 MED ORDER — DICLOFENAC SODIUM 1 % EX GEL
4.0000 g | Freq: Four times a day (QID) | CUTANEOUS | Status: DC
  Administered 2023-07-20: 4 g via TOPICAL
  Filled 2023-07-19: qty 100

## 2023-07-19 NOTE — Progress Notes (Addendum)
Patient ID: Michael Kidd, male   DOB: 06-Apr-1930, 89 y.o.   MRN: [id number]  SW received updates from Cheryl/[facility] reporting pt is active with services.  *updated to inform pt going to SNF.   Cecile Sheerer, MSW, LCSWA Office: [phone] Cell: [phone] Fax: [phone]

## 2023-07-19 NOTE — Progress Notes (Signed)
Physical Therapy Session Note  Patient Details  Name: Michael Kidd MRN: [id number] Date of Birth: 1930-01-19  Today's Date: 07/19/2023 PT Individual Time: 1020-1130 PT Individual Time Calculation (min): 70 min   Short Term Goals: Week 3:  PT Short Term Goal 1 (Week 3): STG = LTG due to ELOS  Skilled Therapeutic Interventions/Progress Updates:    Pt presents in room in recliner, agreeable to PT, pt denies pain. Session focused on therapeutic use of environment to facilitate engagement and participation with therapy, facilitating participation with self care tasks, therapeutic exercise to promote BLE strengthening, muscle fiber recruitment, and tolerance to upright, and gait training with RW. Vitals assessed and therapist dons abdominal binder at beginning of session to improve tolerance to upright.  Pt completes seated/standing therex as interval training (seated exercise 30sec work/30 sec rest, standing exercise work/2min rest) to promote improved tolerance to activity, tolerance to upright, dynamic standing balance, and BLE strengthening needed for functional transfers and ambulation including: - seated marching alternate BLEs x30 sec - seated LAQs x30 sec 1st with LLE, 2nd wiith RLE, 3rd alternating BLEs - seated heel raise 30 sec - seated toe raise - sit<>stand - standing marches (cues for RLE marching) - standing mini squats  Pt requests to return to room due to needing to have BM. Pt transported dependently back to room, pt completes ambualtory transfer with heavy min assist ~15' from [facility] to toilet. Pt demonstrates poor RLE foot clearance, mod verbal cues for positioning prior to sitting on commode. Pt continent of BM, charted, requires min assist to stand but completes hygiene with CGA for stability in standing with RUE support on RW. Pt requires mod assist for managing pants in standing with mod verbal cues. Pt ambulates to sink to perform hand hygiene with min  assist, requires max verbal cues for positioning self closer to sink as well as sequencing hand hygiene and reaching outside of BOS. Pt returns to sitting in [facility] for seated rest break prior to gait trial.  Pt ambulates with RW min/mod assist with same person WC follow 190', pt requires max verbal cues to increase proximity to RW, upright posture, and RLE advance. With fatigue pt demonstrates significant decrease in R step length, therapist provides mod assist for weightshifting to L with pt demonstrating improved RLE foot clearance.  Pt returns to room and remains seated in recliner with all needs within reach, cal light in place and chair alarm donned and activated at end of session.  Vitals: 104/65 sitting prior to activity  Therapy Documentation Precautions:  Precautions Precautions: Fall Restrictions Weight Bearing Restrictions: No   Therapy/Group: Individual Therapy  Edwin Cap PT, DPT 07/19/2023, 12:24 PM

## 2023-07-19 NOTE — Progress Notes (Signed)
PROGRESS NOTE   Subjective/Complaints: No acute complaints.  No events overnight. P.o. intakes 25 to 55% of meals Continent of bowel and bladder, last bowel movement 810, medium.  ROS: limited due to cognition/depression + Sore throat-resolved + Depression-patient denying, endorsing passive SI to other staff  Objective:   CUP PACEART REMOTE DEVICE CHECK  Result Date: 07/18/2023 ILR summary report received. Battery status OK. Normal device function. No new symptom, tachy, brady, or pause episodes. No new AF episodes. Monthly summary reports and ROV/PRN - CS, CVRS  Recent Labs    07/19/23 0628  WBC 5.7  HGB 12.6*  HCT 37.0*  PLT 186    Recent Labs    07/19/23 0628  NA 141  K 3.9  CL 103  CO2 27  GLUCOSE 94  BUN 17  CREATININE 1.13  CALCIUM 8.5*       Intake/Output Summary (Last 24 hours) at 07/19/2023 1009 Last data filed at 07/19/2023 8657 Gross per 24 hour  Intake 478 ml  Output 250 ml  Net 228 ml        Physical Exam: Vital Signs Blood pressure (!) 117/58, pulse (!) 52, temperature 97.9 F (36.6 C), temperature source Oral, resp. rate 15, height 5\' 11"  (1.803 m), weight 86.8 kg, SpO2 94%.     General: awake, alert, calm- sitting up in bedside chair HENT:  oropharynx moist CV: regular rate and rhythm; no JVD Pulmonary: CTA B/L; no W/R/R- good air movement GI: soft, NT, ND, (+)BS Psychiatric: sleepy, but calm- just depressed affect Neurological: doesn't know why in hospital- upset when heard had stroke Psych: pleasant, normal affect; mildly flat Skin: Multiple punctate facial lesions bleeding, nails appear short and well clipped.  Apparent erythema or drainage.  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 +TTP bilateral medial and lateral knee joint lines, no palpable effusion  Neurologic exam:  Moving all 4 extremities to gravity Alert and awake, oriented to person,  place, and partially to time with cues. - unchanged   Mild dysarthria and hypophonic voice  speech fluent, cranial nerves II through XII grossly intact, Follows commands    Assessment/Plan: 1. Functional deficits which require 3+ hours per day of interdisciplinary therapy in a comprehensive inpatient rehab setting. Physiatrist is providing close team supervision and 24 hour management of active medical problems listed below. Physiatrist and rehab team continue to assess barriers to discharge/monitor patient progress toward functional and medical goals  Care Tool:  Bathing    Body parts bathed by patient: Right arm, Left arm, Chest, Abdomen, Front perineal area, Buttocks, Right upper leg, Face, Left upper leg   Body parts bathed by helper: Right lower leg, Left lower leg     Bathing assist Assist Level: Minimal Assistance - Patient > 75%     Upper Body Dressing/Undressing Upper body dressing   What is the patient wearing?: Pull over shirt    Upper body assist Assist Level: Supervision/Verbal cueing    Lower Body Dressing/Undressing Lower body dressing      What is the patient wearing?: Underwear/pull up, Pants     Lower body assist Assist for lower body dressing: Minimal Assistance - Patient >  75%     Toileting Toileting    Toileting assist Assist for toileting: Minimal Assistance - Patient > 75%     Transfers Chair/bed transfer  Transfers assist     Chair/bed transfer assist level: Minimal Assistance - Patient > 75%     Locomotion Ambulation   Ambulation assist      Assist level: Minimal Assistance - Patient > 75% Assistive device: Walker-rolling Max distance: 50'   Walk 10 feet activity   Assist     Assist level: Minimal Assistance - Patient > 75% Assistive device: Walker-rolling   Walk 50 feet activity   Assist    Assist level: Minimal Assistance - Patient > 75% Assistive device: Walker-rolling    Walk 150 feet activity   Assist     Assist level: Minimal Assistance - Patient > 75% Assistive device: Walker-rolling    Walk 10 feet on uneven surface  activity   Assist     Assist level: Minimal Assistance - Patient > 75% Assistive device: Development worker, international aid     Assist Is the patient using a wheelchair?: Yes Type of Wheelchair: Manual    Wheelchair assist level: Dependent - Patient 0% Max wheelchair distance: 150'    Wheelchair 50 feet with 2 turns activity    Assist        Assist Level: Dependent - Patient 0%   Wheelchair 150 feet activity     Assist      Assist Level: Dependent - Patient 0%   Blood pressure (!) 117/58, pulse (!) 52, temperature 97.9 F (36.6 C), temperature source Oral, resp. rate 15, height 5\' 11"  (1.803 m), weight 86.8 kg, SpO2 94%.  Medical Problem List and Plan: 1. Functional deficits secondary to ischemic infarction left internal capsule as well as history of prior left brain infarcts             -patient may  shower             -ELOS/Goals: 10-14 days -supervision - Estimated discharge 07/21/23             Continue CIR  -7-26: Report per wife she fell at home and broke her wrist, is currently hospitalized.  No others caregiver support available at home.  - unable to do hands on assist but can do SPV   - 8/6: Min A with OT except when fatigued; will needs hands on care at home. Looking into hired caregivers at home vs ALF. SLP working on orientation and cognition, does well with basic expressions but defaults to "I dont know" with higher cognitive tasks.    Con't CIR PT, OT and SLP- still doesn't want to engege today- forgot why here.   2.  Antithrombotics: -DVT/anticoagulation:  Pharmaceutical: Lovenox             -antiplatelet therapy: Aspirin 81 mg daily and Brilinta 90 mg twice daily x 4 weeks then aspirin alone-since failed Plavix 3. Pain Management: Tylenol as needed 4. Mood/Behavior/Sleep: Namenda 10 mg twice daily.  Continue Trazodone 50 mg  as needed for sleep.             -antipsychotic agents: Provide emotional support  -Endorsing some very mild passive SI to PT; continue fluoxetine 10 mg daily, scheduled for neuropsych eval - Pt with some lability but affect is otherwise normal endorses good appetite   - 8/8: Increase Prozac to 20 mg daily for treatment dose for depression  8-9: Endorsing poor sleep, DC melatonin as this was  not effective, scheduled trazodone 50 mg nightly.  No further medication adjustments due to recent increase in Prozac, monitor for serotonergic symptoms.  Did not feel psychiatry consult is appropriate due to lack of active SI.  Family to bring in photos this weekend.  5. Neuropsych/cognition: This patient is not capable of making decisions on his own behalf.  6. Skin/Wound Care: Routine skin checks  - Xeroform to R forearm  8/10- skin tears healed- con't to monitor skin  7. Fluids/Electrolytes/Nutrition: Routine and helpful follow-up chemistries  -Mild AKI on admission documentation, encourage p.o. intakes, recheck Monday  -7/27 continue encourage p.o. fluid intake  -728 recheck tomorrow BMP ordered  -7/30 creatinine/BUN up to 26/1.25, will start IVF normal saline  -8/1 creatinine and BUN improved  -8/2 IVF discontinued, monitor PO intake  -8/5 BUN/Cr stable at 18/1.07  8/11- labs Monday - ordered for AM-stable  8.  Hypertension.  Norvasc 5 mg daily, lisinopril 20 mg daily.  Monitor with increased mobility  7/30 will stop Norvasc  7/31 DC lisinopril 10mg   8/1 BP stable and appears orthostatic hypotension has improved, continue to monitor  8/10-8/11 BP controlled- cont' regimen    07/19/2023    5:30 AM 07/18/2023    8:00 PM 07/18/2023    3:49 PM  Vitals with BMI  Systolic 117 158 161  Diastolic 58 68 68  Pulse 52 62 73     9.  Hyperlipidemia.  Continue Lipitor  10.  BPH/Urinary urgency/frequency/incontinence- suggest timed voiding. Check PVR.  No documented retention  --7/28 remains  continent, bladder scan yesterday 122 continue to monitor  -7/31 remains continent, monitor as he is on IVF and will likely need to urinate for frequentyl  Off IVF, stable, monitor.  11.  Aortic stenosis status post TAVR March 2021 as well as history of CAD with stenting.  Follow-up outpatient with Dr. Gypsy Balsam.  12. Hx of mild dementia- continue Namenda  13.  Orthostatic hypotension  -7/30 IVF normal saline 50 mL per hour and decrease Norvasc.  Abdominal binder and teds  -7/31 DC lisinopril, continue IVF  -8/2 monitor with DC IVF  8/4: BP reviewed and slightly elevated, will not add medication given history of orthostatic hypotension  - 8/6: BP dowentrendiong today; encourage POs , daily orthostats, abdominal binder, TEDs -some improvement today, monitor  -improving, monitor  14. Constipation.   -Continue senokot and PEG, add sorbitol prn  continue miralax to BID  - 8/8: Last bowel movement, medium  8/10- LBM yesterday  8/11- LBM yesterday   LOS: 18 days A FACE TO FACE EVALUATION WAS PERFORMED  Angelina Sheriff 07/19/2023, 10:09 AM

## 2023-07-19 NOTE — Progress Notes (Addendum)
Patient ID: Michael Kidd, male   DOB: May 29, 1930, 89 y.o.   MRN: [id number]  SW left message for Lauren/Admissions with [facility] to inquire about updates on insurance auth. Reports it remains in pending status.   *SW returned phone call to pt wife Michael Kidd to inform on above. She reports she is going to look into additional supports, and remains concerned if he has to return home. She is aware SW will follow-up once there is an update.  *Received updates reporting still waiting on auth from insurance.   1429- auth approved for SNF placement. Rm 210; Nurse Report [phone] ask for [facility].   [facility] ambulance pick up at 1030am tomorrow. SW updated pt wife on above, and encouraged her to be at the SNF to sign admission paperwork.   Cecile Sheerer, MSW, LCSWA Office: [phone] Cell: [phone] Fax: [phone]

## 2023-07-19 NOTE — Progress Notes (Signed)
Speech Language Pathology Daily Session Note  Patient Details  Name: Michael Kidd MRN: [id number] Date of Birth: 24-Mar-1930  Today's Date: 07/19/2023 SLP Individual Time: 0100-0200 SLP Individual Time Calculation (min): 60 min  Short Term Goals: Week 3: SLP Short Term Goal 1 (Week 3): Patient will improve intelligbility to 85% at the phrase level with modA verbal cues for use of intelligibility strategies SLP Short Term Goal 2 (Week 3): Pt will complete functional word finding tasks w/ maxA visual/verbal cues SLP Short Term Goal 3 (Week 3): Pt will express basic wants/needs with modA visual/verbal cues SLP Short Term Goal 4 (Week 3): Pt will utilize external memory aids w/ modA visual/verbal cues to verbalize orientation information  Skilled Therapeutic Interventions:  Pt was seen in PM to address cogntiive re- training, expressive language, and speech intelligibility. Pt was alert and seen at bedside. When asked how he felt, pt responded, "not good." He was unable to elaborate. Pt was emotionally labile during session and ocassionally requiring additional time for recovery. During structured tasks, pt warranting frequent encouragement as indicated by asking, "am I doing okay?" SLP initiated session by initiating conversation around cars. Pt observed with car magazine at bedside. 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 SLP further challenged pt through discussion on preferred topic. Pt verbalized his children and grandchildren's names. He further expressed where he lived. Pt indep recalled location and situation. Given cues to attend to external aid, he identified temporal concepts. SLP engaged pt in word finding through confrontational naming task. Given pictures presented verbally, pt verbalized names of objects with 92% acc (39/42). Pt expressed basic needs intermittently throughout session as indicated by asking clinician to "turn th heat up" "turn the TV on" and "pul my shirt down."  In  addiitonal minutes of session, SLP challenged pt to recall use of call button. Pt recalled information in 0/3 opportunities. Pt able to increase vocal intensity spontaneously throughout session however minimal success noted with verbal cues for elevation. As session concluded and pt asked SLP to turn TV on, he further asked about locating [employer]. With min A he identified external aid and with mod A used remote to navigate to that station. Pt was left at bedside with call button within reach and bed alarm active. SLP to continue POC.   Pain Pain Assessment Pain Scale: 0-10 Pain Score: 0-No pain  Therapy/Group: Individual Therapy  Renaee Munda 07/19/2023, 1:52 PM

## 2023-07-19 NOTE — Discharge Summary (Signed)
Physician Discharge Summary  Patient ID: Michael Kidd MRN: [id number] DOB/AGE: 1930-05-21 89 y.o.  Admit date: 07/01/2023 Discharge date: 07/20/2023  Discharge Diagnoses:  Principal Problem:   Ischemic cerebrovascular accident (CVA) [facility]) Active Problems:   Vascular dementia with depressed mood (HCC)   Neuropathic pain, leg, bilateral   BPH (benign prostatic hyperplasia)   Current severe episode of major depressive disorder without psychotic features (HCC)   Orthostatic hypotension DVT prophylaxis Aortic stenosis Hyperlipidemia CAD with stenting Tobacco use  Discharged Condition: Stable  Significant Diagnostic Studies: CUP PACEART REMOTE DEVICE CHECK  Result Date: 07/18/2023 ILR summary report received. Battery status OK. Normal device function. No new symptom, tachy, brady, or pause episodes. No new AF episodes. Monthly summary reports and ROV/PRN - CS, CVRS  ECHOCARDIOGRAM COMPLETE  Result Date: 06/24/2023    ECHOCARDIOGRAM REPORT   Patient Name:   Michael Kidd Date of Exam: 06/24/2023 Medical Rec #:  [id number]     Height:       71.0 in Accession #:    [id number]    Weight:       209.4 lb Date of Birth:  06-Oct-1930    BSA:          2.150 m Patient Age:    89 years      BP:           156/84 mmHg Patient Gender: M             HR:           71 bpm. Exam Location:  Inpatient Procedure: 2D Echo, Color Doppler and Cardiac Doppler Indications:    Stroke  History:        Patient has prior history of Echocardiogram examinations, most                 recent 01/04/2023. CAD, Aortic Valve Disease and s/p TAVR w/                 on 09/24/20; Risk Factors:Dyslipidemia and                 Hypertension.  Sonographer:    Milbert Coulter Referring Phys: [id number] Cecille Po MELVIN IMPRESSIONS  1. Left ventricular ejection fraction, by estimation, is 60 to 65%. The left ventricle has normal function. The left ventricle has no regional wall motion abnormalities. There is mild concentric left  ventricular hypertrophy. Left ventricular diastolic parameters are consistent with Grade I diastolic dysfunction (impaired relaxation).  2. Right ventricular systolic function is normal. The right ventricular size is normal.  3. Left atrial size was mild to moderately dilated.  4. The mitral valve is normal in structure. No evidence of mitral valve regurgitation. No evidence of mitral stenosis.  5. The aortic valve has been repaired/replaced. Aortic valve regurgitation is mild. No aortic stenosis is present. Echo findings are consistent with normal structure and function of the aortic valve prosthesis. Aortic valve mean gradient measures 9.0 mmHg. Aortic valve Vmax measures 2.10 m/s. FINDINGS  Left Ventricle: Left ventricular ejection fraction, by estimation, is 60 to 65%. The left ventricle has normal function. The left ventricle has no regional wall motion abnormalities. The left ventricular internal cavity size was normal in size. There is  mild concentric left ventricular hypertrophy. Left ventricular diastolic parameters are consistent with Grade I diastolic dysfunction (impaired relaxation). Right Ventricle: The right ventricular size is normal. No increase in right ventricular wall thickness. Right ventricular systolic function is normal. Left Atrium: Left atrial  size was mild to moderately dilated. Right Atrium: Right atrial size was normal in size. Pericardium: There is no evidence of pericardial effusion. Mitral Valve: The mitral valve is normal in structure. No evidence of mitral valve regurgitation. No evidence of mitral valve stenosis. Tricuspid Valve: The tricuspid valve is normal in structure. Tricuspid valve regurgitation is mild . No evidence of tricuspid stenosis. Aortic Valve: The aortic valve has been repaired/replaced. Aortic valve regurgitation is mild. Aortic regurgitation PHT measures 611 msec. No aortic stenosis is present. Aortic valve mean gradient measures 9.0 mmHg. Aortic valve peak  gradient measures 17.6 mmHg. There is a 26 mm Sapien prosthetic, stented (TAVR) valve present in the aortic position. Echo findings are consistent with normal structure and function of the aortic valve prosthesis. Pulmonic Valve: The pulmonic valve was normal in structure. Pulmonic valve regurgitation is trivial. No evidence of pulmonic stenosis. Aorta: The aortic root is normal in size and structure. Venous: The inferior vena cava was not well visualized. IAS/Shunts: No atrial level shunt detected by color flow Doppler.  LEFT VENTRICLE PLAX 2D LVIDd:         3.50 cm Diastology LVIDs:         2.80 cm LV e' medial:    5.55 cm/s LV PW:         1.40 cm LV E/e' medial:  8.0 LV IVS:        1.40 cm LV e' lateral:   7.18 cm/s                        LV E/e' lateral: 6.2  RIGHT VENTRICLE RV S prime:     11.60 cm/s TAPSE (M-mode): 1.7 cm LEFT ATRIUM             Index        RIGHT ATRIUM           Index LA Vol (A2C):   58.4 ml 27.16 ml/m  RA Area:     17.30 cm LA Vol (A4C):   52.2 ml 24.28 ml/m  RA Volume:   45.20 ml  21.02 ml/m LA Biplane Vol: 56.9 ml 26.46 ml/m  AORTIC VALVE AV Vmax:           210.00 cm/s AV Vmean:          139.000 cm/s AV VTI:            0.407 m AV Peak Grad:      17.6 mmHg AV Mean Grad:      9.0 mmHg LVOT Vmax:         93.80 cm/s LVOT Vmean:        61.300 cm/s LVOT VTI:          0.202 m LVOT/AV VTI ratio: 0.50 AI PHT:            611 msec  AORTA Ao Asc diam: 3.30 cm MITRAL VALVE               TRICUSPID VALVE MV Area (PHT): 3.53 cm    TR Peak grad:   22.8 mmHg MV Decel Time: 215 msec    TR Vmax:        239.00 cm/s MV E velocity: 44.50 cm/s MV A velocity: 99.60 cm/s  SHUNTS MV E/A ratio:  0.45        Systemic VTI: 0.20 m Arvilla Meres MD Electronically signed by Arvilla Meres MD Signature Date/Time: 06/24/2023/1:04:46 PM    Final  MR BRAIN WO CONTRAST  Result Date: 06/23/2023 CLINICAL DATA:  Acute neurologic deficit EXAM: MRI HEAD WITHOUT CONTRAST TECHNIQUE: Multiplanar, multiecho pulse  sequences of the brain and surrounding structures were obtained without intravenous contrast. COMPARISON:  01/04/2023 FINDINGS: Brain: Small area of abnormal diffusion restriction of the left corpus striatum. No acute or chronic hemorrhage. There is multifocal hyperintense T2-weighted signal within the white matter. Generalized volume loss. Old right parietal and occipital infarcts. Old small vessel infarcts of the basal ganglia, cerebellum and corona radiata. The midline structures are normal. Vascular: Major flow voids are preserved. Skull and upper cervical spine: Normal calvarium and skull base. Visualized upper cervical spine and soft tissues are normal. Sinuses/Orbits:Mild maxillary sinus mucosal thickening. Normal orbits. IMPRESSION: 1. Small area of acute ischemia of the left corpus striatum. No hemorrhage or mass effect. 2. Old right parietal and occipital infarcts. 3. Old small vessel infarcts of the basal ganglia, cerebellum and corona radiata. Electronically Signed   By: Michael Kidd M.D.   On: 06/23/2023 20:25   CT ANGIO HEAD NECK W WO CM (CODE STROKE)  Result Date: 06/23/2023 CLINICAL DATA:  Neuro deficit, acute, stroke suspected. Right-sided deficit. EXAM: CT ANGIOGRAPHY HEAD AND NECK WITH AND WITHOUT CONTRAST TECHNIQUE: Multidetector CT imaging of the head and neck was performed using the standard protocol during bolus administration of intravenous contrast. Multiplanar CT image reconstructions and MIPs were obtained to evaluate the vascular anatomy. Carotid stenosis measurements (when applicable) are obtained utilizing NASCET criteria, using the distal internal carotid diameter as the denominator. RADIATION DOSE REDUCTION: This exam was performed according to the departmental dose-optimization program which includes automated exposure control, adjustment of the mA and/or kV according to patient size and/or use of iterative reconstruction technique. CONTRAST:  75mL OMNIPAQUE IOHEXOL 350 MG/ML  SOLN COMPARISON:  Head CT 06/23/2023. CTA head 01/04/2023. MRI brain 08/26/2019. FINDINGS: CTA NECK FINDINGS Aortic arch: Four vessel arch configuration with separate origin of the left vertebral artery. Atherosclerotic calcifications of the aortic arch and arch vessel origins. Arch vessel origins are patent. Right carotid system: No evidence of dissection, stenosis (50% or greater), or occlusion. Calcified plaque of the right carotid bulb. Tortuous distal cervical ICA. Left carotid system: No evidence of dissection, stenosis (50% or greater), or occlusion. Mixed plaque of the left carotid bulb. Mildly tortuous distal cervical ICA. Vertebral arteries: Codominant. No evidence of dissection, stenosis (50% or greater), or occlusion. Skeleton: Mild cervical spondylosis without high-grade spinal canal stenosis. Other neck: Unremarkable. Upper chest: Unremarkable. Review of the MIP images confirms the above findings CTA HEAD FINDINGS Anterior circulation: Calcified plaque along the carotid siphons without hemodynamically significant stenosis. The proximal ACAs and MCAs are patent without stenosis or aneurysm. Distal branches are symmetric. Posterior circulation: Normal basilar artery. The SCAs, AICAs and PICAs are patent proximally. The PCAs are patent proximally without stenosis or aneurysm. Distal branches are symmetric. Venous sinuses: As permitted by contrast timing, patent. Anatomic variants: Persistent fetal origin of the left PCA with hypoplastic left P1 segment. Review of the MIP images confirms the above findings IMPRESSION: No large vessel occlusion, hemodynamically significant stenosis, or aneurysm in the head or neck. Aortic Atherosclerosis (ICD10-I70.0). Electronically Signed   By: Orvan Falconer M.D.   On: 06/23/2023 17:15   CT HEAD CODE STROKE WO CONTRAST  Result Date: 06/23/2023 CLINICAL DATA:  Code stroke. Neuro deficit, acute, stroke suspected. Slurred speech and right-sided weakness. EXAM: CT HEAD  WITHOUT CONTRAST TECHNIQUE: Contiguous axial images were obtained from the base of the  skull through the vertex without intravenous contrast. RADIATION DOSE REDUCTION: This exam was performed according to the departmental dose-optimization program which includes automated exposure control, adjustment of the mA and/or kV according to patient size and/or use of iterative reconstruction technique. COMPARISON:  Head CT 01/04/2023. FINDINGS: Brain: New asymmetric hypoattenuation in the posterior limb of the left internal capsule, suspicious for acute infarct. Old infarct in the left thalamus. Background of moderate chronic small-vessel disease. No hydrocephalus or extra-axial collection. No mass effect or midline shift. Vascular: No hyperdense vessel or unexpected calcification. Skull: No calvarial fracture or suspicious bone lesion. Skull base is unremarkable. Sinuses/Orbits: Mild mucosal disease in the maxillary and frontal sinuses. Orbits are unremarkable. Other: None. ASPECTS (Alberta Stroke Program Early CT Score) - Ganglionic level infarction (caudate, lentiform nuclei, internal capsule, insula, M1-M3 cortex): 6 - Supraganglionic infarction (M4-M6 cortex): 3 Total score (0-10 with 10 being normal): 9 IMPRESSION: 1. New asymmetric hypoattenuation in the posterior limb of the left internal capsule, suspicious for acute infarct. No hemorrhage or mass effect. 2. ASPECT score is 9. Code stroke imaging results were communicated on 06/23/2023 at 5:09 pm to provider Dr. Derry Lory via secure text paging. Electronically Signed   By: Orvan Falconer M.D.   On: 06/23/2023 17:09    Labs:  Basic Metabolic Panel: Recent Labs  Lab 07/15/23 0735 07/19/23 0628  NA 142 141  K 3.9 3.9  CL 104 103  CO2 26 27  GLUCOSE 106* 94  BUN 22 17  CREATININE 1.17 1.13  CALCIUM 8.9 8.5*    CBC: Recent Labs  Lab 07/19/23 0628  WBC 5.7  NEUTROABS 2.5  HGB 12.6*  HCT 37.0*  MCV 95.1  PLT 186    CBG: No results for  input(s): "GLUCAP" in the last 168 hours.  Family history.  Mother with diabetes father with CVA.  Denies any colon cancer esophageal cancer or rectal cancer  Brief HPI:   Michael Kidd is a 89 y.o. right-handed male with history of hypertension, BPH, aortic stenosis status post TAVR 2021, CAD with stenting hyperlipidemia tobacco use history of CVA January 2024 maintained on Plavix with workup completed at [facility].  Patient also with mild dementia on Namenda and depression.  Per chart review lives with spouse.  Wife assist with ADLs.  Presented 06/23/2019 24th right sided weakness and aphasia.  CT/MRI showed small acute area of ischemia of the left corpus stratum.  No hemorrhage or mass effect.  Old right parietal and occipital infarcts as well as old small vessel infarct of the basal ganglia cerebellum and corona radiata.  CTA showed no large vessel occlusion or hemodynamically significant stenosis.  Admission chemistry is unremarkable except potassium 3.4 glucose 136 hemoglobin A1c 5.7.  Echocardiogram with ejection fraction of 60 to 65% no wall motion abnormalities grade 1 diastolic dysfunction.  Neurology follow-up maintained on low-dose aspirin as well as Brilinta.  Lovenox added for DVT prophylaxis.  Therapy evaluations completed due to patient decreased functional mobility right sided weakness was admitted for comprehensive rehab program.   Hospital Course: TARENCE STUEDEMANN was admitted to rehab 07/01/2023 for inpatient therapies to consist of PT, ST and OT at least three hours five days a week. Past admission physiatrist, therapy team and rehab RN have worked together to provide customized collaborative inpatient rehab.  Pertaining to patient's ischemic infarction left internal capsule as well as history of prior left brain infarct remained stable.  Remained on aspirin and Brilinta x 4 weeks then aspirin alone  since failed Plavix in the past.  Subcutaneous Lovenox for DVT prophylaxis no bleeding  episodes.  Bouts of hypotension receiving IV normal saline 50 mL an hour 7/30 and Norvasc decreased.  Lisinopril discontinued IV fluids completed 7/31.  History of mild dementia remained on Namenda.  BPH with urinary frequency and voiding without difficulty.  History of aortic stenosis TAVR procedure 2021 as well as CAD with stenting.  Follow-up outpatient with Dr. Gypsy Balsam.   Blood pressures were monitored on TID basis and soft and monitored     Rehab course: During patient's stay in rehab weekly team conferences were held to monitor patient's progress, set goals and discuss barriers to discharge. At admission, patient required minimal assist 130 feet with rolling walker minimal assist step pivot transfers  Physical exam.  Blood pressure 132/64 pulse 72 temperature 98.3 respirations 18 oxygen saturation is 96% room air Constitutional.  No acute distress HEENT Head.  Normocephalic and atraumatic Eyes.  Pupils round and reactive to light no discharge without nystagmus Neck.  Supple nontender no JVD without thyromegaly Cardiac regular rate and rhythm without any extra sounds or murmur heard Abdomen.  Soft nontender positive bowel sounds without rebound Respiratory effort normal no respiratory distress without wheeze Musculoskeletal Comments.  Right upper extremity biceps 4+/5 triceps 4/5 wrist extension 4+/5 grip 4 -/5 and FA 3+/5 Left upper extremity 5 -/5 same muscles Right lower extremity 4+/5 hip flexors knee extension knee flexion dorsi plantarflexion Left lower extremity 5 -/5 in same muscles Neurologic.  Alert makes eye contact with examiner.  Speech was a bit dysarthric but intelligible.  Provides name but limited medical historian   He/She  has had improvement in activity tolerance, balance, postural control as well as ability to compensate for deficits. He/She has had improvement in functional use RUE/LUE  and RLE/LLE as well as improvement in awareness.  Completes  seated/standing interval training with supervision.  Ambulates to the sink to perform hand hygiene with minimal assist.  Ambulates with rolling walker min mod assist with same person 190 feet.  Sit to stand transition completed with moderate assist from elevated surface using rolling walker.  Completed contact-guard step pivot transfers from bed to wheelchair.  Full teaching completed plan Home versus skilled nursing facility       Disposition: SNF    Diet: Regular  Special Instructions: No driving smoking or alcohol  Medications at discharge 1.  Tylenol as needed 2.  Aspirin 81 mg p.o. daily 3.  Lipitor 80 mg p.o. daily 4.  Tums tablets 200 mg 3 times daily with meals 5.  Voltaren gel 4 g 4 times daily to affected area 6.  Prozac 20 mg p.o. daily 7.  Namenda 10 mg p.o. twice daily 8.  MiraLAX twice daily hold for loose stools 9.  Trazodone 50 mg p.o. nightly 10.  Ventolin inhaler 1 puff every 6 hours as needed shortness of breath 11.  Pro scar 5 mg every evening 12.  B12 1 tablet daily 13.  Senokot S 1 tablet twice daily   30-35 minutes were spent completing discharge summary and discharge planning  Discharge Instructions     Ambulatory referral to Neurology   Complete by: As directed    An appointment is requested in approximately: 4 weeks ischemic left internal capsule infarction   Ambulatory referral to Physical Medicine Rehab   Complete by: As directed         Contact information for follow-up providers     Angelina Sheriff,  DO Follow up.   Specialty: Physical Medicine and Rehabilitation Why: Office to call for appointment Contact information: [address] [place] [address] [phone]              Contact information for after-discharge care     Destination     [facility] .   Service: Skilled Nursing Contact information: [address] [place]  [phone]                     Signed: Mcarthur Rossetti Desirea Mizrahi 07/19/2023, 1:57 PM

## 2023-07-19 NOTE — Plan of Care (Signed)
  Problem: RH BOWEL ELIMINATION Goal: RH STG MANAGE BOWEL WITH ASSISTANCE Description: STG Manage Bowel with min Assistance. Outcome: Progressing   Problem: RH BLADDER ELIMINATION Goal: RH STG MANAGE BLADDER WITH ASSISTANCE Description: STG Manage Bladder With min Assistance Outcome: Progressing   Problem: RH SAFETY Goal: RH STG ADHERE TO SAFETY PRECAUTIONS W/ASSISTANCE/DEVICE Description: STG Adhere to Safety Precautions With cueing Assistance/Device. Outcome: Progressing   Problem: RH COGNITION-NURSING Goal: RH STG USES MEMORY AIDS/STRATEGIES W/ASSIST TO PROBLEM SOLVE Description: STG Uses Memory Aids/Strategies With cueing Assistance to Problem Solve. Outcome: Progressing   Problem: RH PAIN MANAGEMENT Goal: RH STG PAIN MANAGED AT OR BELOW PT'S PAIN GOAL Description: Pain will be managed 4 out of 10 on pain scale with PRN medications min assist Outcome: Progressing

## 2023-07-19 NOTE — Progress Notes (Signed)
Inpatient Rehabilitation Discharge Medication Review by a Pharmacist  A complete drug regimen review was completed for this patient to identify any potential clinically significant medication issues.  High Risk Drug Classes Is patient taking? Indication by Medication  Antipsychotic No   Anticoagulant No   Antibiotic No   Opioid No   Antiplatelet No   Hypoglycemics/insulin No   Vasoactive Medication No   Chemotherapy No   Other Yes Fluoxetine - mood Trazodone - sleep Albuterol prn SOB Atorvastatin - HLD Finasteride - BPH Memantine - Dementia      Type of Medication Issue Identified Description of Issue Recommendation(s)  Drug Interaction(s) (clinically significant)     Duplicate Therapy     Allergy     No Medication Administration End Date     Incorrect Dose     Additional Drug Therapy Needed     Significant med changes from prior encounter (inform family/care partners about these prior to discharge).    Other       Clinically significant medication issues were identified that warrant physician communication and completion of prescribed/recommended actions by midnight of the next day:  No  Pharmacist comments: None  Time spent performing this drug regimen review (minutes):  20 minutes  Thank you Okey Regal, PharmD

## 2023-07-20 NOTE — Progress Notes (Signed)
Speech Language Pathology Discharge Summary  Patient Details  Name: Michael Kidd MRN: [id number] Date of Birth: 01-02-1930  Date of Discharge from SLP service:July 20, 2023  Patient has met 4 of 4 long term goals.  Patient to discharge at overall Mod level.   Reasons goals not met: N/A   Clinical Impression/Discharge Summary: Patient has made functional gains and has met 4 of 4 LTGs this admission. Currently, patient requires overall supervision level verbal cues for auditory comprehension of basic information and Mod A multimodal cues for utilization of speech intelligibility strategies at the phrase level and for word-finding while naming functional items. Mod A verbal cues are also needed for use of external aids for orientation information. Overall, patient's progress was inconsistent and limited by emotional lability with variable motivation to participate in therapy. Patient's family is unable to provide the necessary level of assistance needed at discharge, therefore, patient will discharge to a SNF. Patient would benefit from f/u SLP services to maximize his cognitive-linguistic functioning and reduce caregiver burden.    Care Partner:  Caregiver Able to Provide Assistance: No  Type of Caregiver Assistance: Physical;Cognitive  Recommendation:  24 hour supervision/assistance;Skilled Nursing facility  Rationale for SLP Follow Up: Maximize functional communication;Maximize cognitive function and independence;Reduce caregiver burden   Equipment: N/A   Reasons for discharge: Discharged from hospital   Patient/Family Agrees with Progress Made and Goals Achieved: Yes    Aritzel Krusemark 07/20/2023, 2:41 PM

## 2023-07-20 NOTE — Progress Notes (Signed)
Inpatient Rehabilitation Care Coordinator Discharge Note   Patient Details  Name: Michael Kidd MRN: [id number] Date of Birth: [birth year]   Discharge location: D/c to SNF- [facility]  Length of Stay: 18 days  Discharge activity level: Min Asst  Home/community participation: Limited  Patient response WU:[id number] Literacy - How often do you need to have someone help you when you read instructions, pamphlets, or other written material from your doctor or pharmacy?: Patient unable to respond  Patient response YN:WGNFAO Isolation - How often do you feel lonely or isolated from those around you?: Patient unable to respond  Services provided included: MD, RD, OT, PT, SLP, RN, CM, Pharmacy, Neuropsych, SW, TR  Financial Services:  Field seismologist Utilized: [employer]  Choices offered to/list presented to: patient wife  Follow-up services arranged:              Patient response to transportation need: Is the patient able to respond to transportation needs?: No In the past 12 months, has lack of transportation kept you from medical appointments or from getting medications?: No In the past 12 months, has lack of transportation kept you from meetings, work, or from getting things needed for daily living?: No   Patient/Family verbalized understanding of follow-up arrangements:  Yes  Individual responsible for coordination of the follow-up plan: contact pt wife Talbert Forest [phone]  Confirmed correct DME delivered: Gretchen Short 07/20/2023    Comments (or additional information):  Summary of Stay    Date/Time Discharge Planning CSW  07/19/23 1056 D/c location is pending insurance auth . Pt wife has a broken wrist, and physically unable to assist. She reports there is no family to assist. She is looking into additional supports in the event SNF [facility] is not approved.  SW will continue to confirm there are no barriers to discharge. AAC  07/13/23  1558 D/c location is undecided. Pt wife has a broken wrist,  and physically unable to assist. She reports there is no family to assist. SW will continue to confirm there are no barriers to discharge. AAC  07/05/23 0920 D/c to home with wife, however, wife is not able to physically assist him with any of his care needs. Tentative SNF referral sent out in the event wife is unable to provide care.  SW will confirm there are no barriers to discharge. AAC       Camiah Humm A Lula Olszewski

## 2023-07-20 NOTE — Plan of Care (Signed)
  Problem: RH Balance Goal: LTG Patient will maintain dynamic standing with ADLs (OT) Description: LTG:  Patient will maintain dynamic standing balance with assist during activities of daily living (OT)  Outcome: Completed/Met    Problem: Sit to Stand Goal: LTG:  Patient will perform sit to stand in prep for activites of daily living with assistance level (OT) Description: LTG:  Patient will perform sit to stand in prep for activites of daily living with assistance level (OT) Outcome: Not Met (add Reason) Note: Min A for sit<>stands   Problem: RH Eating Goal: LTG Patient will perform eating w/assist, cues/equip (OT) Description: LTG: Patient will perform eating with assist, with/without cues using equipment (OT) Outcome: Completed/Met   Problem: RH Grooming Goal: LTG Patient will perform grooming w/assist,cues/equip (OT) Description: LTG: Patient will perform grooming with assist, with/without cues using equipment (OT) Outcome: Completed/Met   Problem: RH Bathing Goal: LTG Patient will bathe all body parts with assist levels (OT) Description: LTG: Patient will bathe all body parts with assist levels (OT) Outcome: Completed/Met   Problem: RH Dressing Goal: LTG Patient will perform upper body dressing (OT) Description: LTG Patient will perform upper body dressing with assist, with/without cues (OT). Outcome: Completed/Met Goal: LTG Patient will perform lower body dressing w/assist (OT) Description: LTG: Patient will perform lower body dressing with assist, with/without cues in positioning using equipment (OT) Outcome: Completed/Met   Problem: RH Toileting Goal: LTG Patient will perform toileting task (3/3 steps) with assistance level (OT) Description: LTG: Patient will perform toileting task (3/3 steps) with assistance level (OT)  Outcome: Completed/Met   Problem: RH Toileting Goal: LTG Patient will perform toileting task (3/3 steps) with assistance level (OT) Description:  LTG: Patient will perform toileting task (3/3 steps) with assistance level (OT)  Outcome: Completed/Met   Problem: RH Functional Use of Upper Extremity Goal: LTG Patient will use RT/LT upper extremity as a (OT) Description: LTG: Patient will use right/left upper extremity as a stabilizer/gross assist/diminished/nondominant/dominant level with assist, with/without cues during functional activity (OT) Outcome: Completed/Met   Problem: RH Toilet Transfers Goal: LTG Patient will perform toilet transfers w/assist (OT) Description: LTG: Patient will perform toilet transfers with assist, with/without cues using equipment (OT) Outcome: Not Met (add Reason) Note: Min A for toilet transfer   Problem: RH Tub/Shower Transfers Goal: LTG Patient will perform tub/shower transfers w/assist (OT) Description: LTG: Patient will perform tub/shower transfers with assist, with/without cues using equipment (OT) Outcome: Not Met (add Reason) Note: Min A for tub shower transfer   Problem: RH Awareness Goal: LTG: Patient will demonstrate awareness during functional activites type of (OT) Description: LTG: Patient will demonstrate awareness during functional activites type of (OT) Outcome: Completed/Met

## 2023-07-20 NOTE — Progress Notes (Signed)
Physical Therapy Discharge Summary  Patient Details  Name: Michael Kidd MRN: [id number] Date of Birth: 07-16-1930  Date of Discharge from PT service:July 20, 2023  Today's Date: 07/20/2023 PT Individual Time: 0816-0901 PT Individual Time Calculation (min): 45 min    Patient has met 3 of 10 long term goals due to improved activity tolerance, improved balance, improved postural control, and increased strength.  Patient to discharge at an ambulatory level Min Assist.   Patient's care partner  is unable  to provide the necessary physical assistance at discharge, therefore pt to be discharged to skilled nursing facility to continue progress and facilitate independence with functional mobility.  Reasons goals not met: Inconsistent mastery, limited by poor carryover secondary to cognitive deficits and emotional lability with variable motivation to participate with therapies  Recommendation:  Patient will benefit from ongoing skilled PT services in skilled nursing facility setting to continue to advance safe functional mobility, address ongoing impairments in dynamic standing balance, activity tolerance, tolerance to OOB, gait mechanics, stair negotiation, and minimize fall risk.  Equipment: No equipment provided  Reasons for discharge: discharge from hospital  Patient/family agrees with progress made and goals achieved: Yes  SESSION INTERVENTIONS: Pt presents in room handoff from OT seated in WC. Pt denies pain and agreeable to PT. Pt requests to use restroom at start of session, but unsuccessful with BM. Session focused on gait training, stair training, and transfer training.  Pt transported from room to main gym dependently for time management and energy conservation. Pt ambulates with RW 180' min assist with verbal cues for proximity to RW, improved step length and foot clearance this session. Pt requires seated rest break then navigates up/down 4 steps with BHRs verbal cues for  sequencing ascending leading with LLE, descending leading with RLE, min assist for task. Pt returns to sitting in WC and transported dependently to ortho gym. Pt completes ambulatory transfer with RW ~20' min assist to car, completes transfer with RW min assist. Pt then ambulates up/down ramp with RW min assist, decreased proximity to RW for descending ramp, cues to correct  Pt transported back to room and pt completes ambulatory transfer back to bed with min assist and RW. Pt completes sit to supine with supervision cues for positioning in supine. Pt remains supine with all needs within reach, call light in place, and bed alarm activated at end of session.  PT Discharge  Precautions/Restrictions Precautions Precautions: Fall Precaution Comments: aphasia Restrictions Weight Bearing Restrictions: No Pain Interference Pain Interference Pain Effect on Sleep: 1. Rarely or not at all Pain Interference with Therapy Activities: 1. Rarely or not at all Pain Interference with Day-to-Day Activities: 1. Rarely or not at all Cognition Overall Cognitive Status: History of cognitive impairments - at baseline Arousal/Alertness: Awake/alert Orientation Level: Oriented to person Sustained Attention: Impaired Memory: Impaired Awareness: Impaired Problem Solving: Impaired Safety/Judgment: Impaired Sensation Sensation Light Touch: Appears Intact Coordination Gross Motor Movements are Fluid and Coordinated: No Fine Motor Movements are Fluid and Coordinated: No Coordination and Movement Description: R hemiplegia, decreased coordination RLE noted functionally Motor  Motor Motor: Hemiplegia Motor - Discharge Observations: R hemiplegia, decreased coordination and strength RLE vs. LLE  Mobility Bed Mobility Bed Mobility: Rolling Right;Rolling Left;Supine to Sit;Sit to Supine Rolling Right: Supervision/verbal cueing Rolling Left: Supervision/Verbal cueing Supine to Sit: Supervision/Verbal cueing Sit  to Supine: Supervision/Verbal cueing Transfers Transfers: Sit to Stand;Stand Pivot Transfers;Stand to Sit Sit to Stand: Minimal Assistance - Patient > 75% Stand to Sit: Minimal Assistance -  Patient > 75% Stand Pivot Transfers: Minimal Assistance - Patient > 75% Stand Pivot Transfer Details: Verbal cues for sequencing;Verbal cues for precautions/safety;Verbal cues for technique;Verbal cues for safe use of DME/AE Transfer (Assistive device): Rolling walker Locomotion  Gait Ambulation: Yes Gait Assistance: Minimal Assistance - Patient > 75% Gait Distance (Feet): 180 Feet Assistive device: Rolling walker Gait Assistance Details: Verbal cues for technique;Verbal cues for sequencing;Verbal cues for precautions/safety;Verbal cues for safe use of DME/AE;Verbal cues for gait pattern Gait Gait: Yes Gait Pattern: Impaired (decreased proximity to RW, foot clearance RLE, forward flexed posture) Gait velocity: Decreased Stairs / Additional Locomotion Stairs: Yes Stairs Assistance: Minimal Assistance - Patient > 75% Stair Management Technique: Two rails Number of Stairs: 4 Height of Stairs: 6 Ramp: Minimal Assistance - Patient >75% Pick up small object from the floor (from standing position) activity did not occur: Safety/medical concerns Wheelchair Mobility Wheelchair Mobility: No  Trunk/Postural Assessment  Cervical Assessment Cervical Assessment: Exceptions to [facility] (forward head) Thoracic Assessment Thoracic Assessment: Exceptions to [facility] (kyphosis) Lumbar Assessment Lumbar Assessment: Exceptions to [facility] (posterior pelvic tilt) Postural Control Postural Control: Deficits on evaluation Righting Reactions: Delayed and inadequate Protective Responses: delayed Postural Limitations: decreased  Balance Balance Balance Assessed: Yes Static Sitting Balance Static Sitting - Balance Support: Feet supported Static Sitting - Level of Assistance: 7: Independent Dynamic Sitting Balance Dynamic  Sitting - Balance Support: Feet supported;During functional activity Dynamic Sitting - Level of Assistance: 5: Stand by assistance Static Standing Balance Static Standing - Balance Support: Bilateral upper extremity supported Static Standing - Level of Assistance: 5: Stand by assistance (CGA) Dynamic Standing Balance Dynamic Standing - Balance Support: During functional activity;Bilateral upper extremity supported Dynamic Standing - Level of Assistance: 4: Min assist Extremity Assessment  RLE Assessment RLE Assessment: Exceptions to [facility] General Strength Comments: grossly 4/5 RLE, pt demonstrating pain avoidant behaviors with resisted knee flexion LLE Assessment LLE Assessment: Within Functional Limits General Strength Comments: 5/5 grossly   Edwin Cap PT, DPT 07/20/2023, 12:24 PM

## 2023-07-20 NOTE — Progress Notes (Signed)
Occupational Therapy Discharge Summary  Patient Details  Name: Michael Kidd MRN: [id number] Date of Birth: 02/14/1930  Date of Discharge from OT service:July 20, 2023  Today's Date: 07/20/2023 OT Individual Time: 4098-1191 OT Individual Time Calculation (min): 30 min   OT treatment session focused on increased independence with BADL tasks.BADL tasks and functional mobility all completed at an overall min A level. See functional navigator below for further details regarding BADL performance. Pt left seated in wc at end of session with alarm belt on, call bell in reach, and needs met.   Patient has met 9 of 12 long term goals due to improved activity tolerance, improved balance, postural control, ability to compensate for deficits, functional use of  RIGHT upper and RIGHT lower extremity, improved attention, improved awareness, and improved coordination.  Patient to discharge at [facility] Assist level.  Patient's care partner unavailable to provide the necessary physical and cognitive assistance at discharge, therefore, pt dc to SNF for continued OT in next venue of care.    Reasons goals not met: Pt continues to require min A for functional transfers.   Recommendation:  Patient will benefit from ongoing skilled OT services in skilled nursing facility setting to continue to advance functional skills in the area of BADL, iADL, and Reduce care partner burden.  Equipment: No equipment provided  Reasons for discharge: treatment goals met and discharge from hospital  Patient/family agrees with progress made and goals achieved: Yes  OT Discharge Precautions/Restrictions  Precautions Precautions: Fall Precaution Comments: aphasia Restrictions Weight Bearing Restrictions: No Pain  Denies pain ADL ADL Eating: Set up Grooming: Setup Upper Body Bathing: Supervision/safety Where Assessed-Upper Body Bathing: Shower Lower Body Bathing: Minimal assistance Where Assessed-Lower Body  Bathing: Shower Upper Body Dressing: Supervision/safety Where Assessed-Upper Body Dressing: Wheelchair Lower Body Dressing: Minimal assistance Where Assessed-Lower Body Dressing: Wheelchair Toileting: Minimal assistance Toilet Transfer: Minimal assistance Tub/Shower Transfer: Minimal assistance Tub/Shower Transfer Method: Stand pivot Tub/Shower Equipment: Emergency planning/management officer, Grab bars Perception  Perception: Within Functional Limits Praxis Praxis: Impaired Praxis Impairment Details: Perseveration Cognition Cognition Overall Cognitive Status: History of cognitive impairments - at baseline Arousal/Alertness: Awake/alert Orientation Level: Person;Situation;Place Person: Oriented Place: Oriented Situation: Oriented Memory: Impaired Awareness: Impaired Problem Solving: Impaired Behaviors: Lability Safety/Judgment: Impaired Brief Interview for Mental Status (BIMS) Repetition of Three Words (First Attempt): 3 Temporal Orientation: Year: Missed by more than 5 years Temporal Orientation: Month: Missed by more than 1 month Temporal Orientation: Day: Incorrect Recall: "Sock": No, could not recall Recall: "Blue": No, could not recall Recall: "Bed": No, could not recall BIMS Summary Score: 3 Sensation Sensation Light Touch: Appears Intact Coordination Gross Motor Movements are Fluid and Coordinated: No Fine Motor Movements are Fluid and Coordinated: No Coordination and Movement Description: R hemiplegia, decreased smoothness and accracy with R hand and R LE Motor  Motor Motor: Hemiplegia Motor - Skilled Clinical Observations: R hemiplegia Mobility  Bed Mobility Bed Mobility: Rolling Right;Rolling Left;Supine to Sit;Sit to Supine Rolling Right: Supervision/verbal cueing Rolling Left: Supervision/Verbal cueing Supine to Sit: Supervision/Verbal cueing Sit to Supine: Supervision/Verbal cueing Transfers Sit to Stand: Minimal Assistance - Patient > 75% Stand to Sit: Minimal  Assistance - Patient > 75%  Balance Dynamic Sitting Balance Dynamic Sitting - Level of Assistance: 5: Stand by assistance Static Standing Balance Static Standing - Level of Assistance: 4: Min assist Dynamic Standing Balance Dynamic Standing - Level of Assistance: 4: Min assist Extremity/Trunk Assessment RUE Assessment RUE Assessment: Exceptions to [facility] Active Range of Motion (AROM) Comments:  shoulder FF limited to ~80 degrees, 4/5 strength grossly LUE Assessment LUE Assessment: Exceptions to [facility] General Strength Comments: Shoulder FF limited to ~ 90 degrees at baseline   Michael Kidd 07/20/2023, 8:04 AM

## 2023-07-20 NOTE — Plan of Care (Signed)
  Problem: RH Balance Goal: LTG Patient will maintain dynamic standing balance (PT) Description: LTG:  Patient will maintain dynamic standing balance with assistance during mobility activities (PT) Outcome: Not Met (add Reason) Note: Inconsistent mastery, limited by poor carryover secondary to cognitive deficits and emotional lability with variable motivation to participate with therapies   Problem: Sit to Stand Goal: LTG:  Patient will perform sit to stand with assistance level (PT) Description: LTG:  Patient will perform sit to stand with assistance level (PT) Outcome: Not Met (add Reason) Note: Inconsistent mastery, limited by poor carryover secondary to cognitive deficits and emotional lability with variable motivation to participate with therapies   Problem: RH Bed to Chair Transfers Goal: LTG Patient will perform bed/chair transfers w/assist (PT) Description: LTG: Patient will perform bed to chair transfers with assistance (PT). Outcome: Not Met (add Reason) Note: Inconsistent mastery, limited by poor carryover secondary to cognitive deficits and emotional lability with variable motivation to participate with therapies   Problem: RH Car Transfers Goal: LTG Patient will perform car transfers with assist (PT) Description: LTG: Patient will perform car transfers with assistance (PT). Outcome: Not Met (add Reason) Note: Inconsistent mastery, limited by poor carryover secondary to cognitive deficits and emotional lability with variable motivation to participate with therapies   Problem: RH Ambulation Goal: LTG Patient will ambulate in home environment (PT) Description: LTG: Patient will ambulate in home environment, # of feet with assistance (PT). Outcome: Not Met (add Reason) Note: Inconsistent mastery, limited by poor carryover secondary to cognitive deficits and emotional lability with variable motivation to participate with therapies Goal: LTG Patient will ambulate in community  environment (PT) Description: LTG: Patient will ambulate in community environment, # of feet with assistance (PT). Outcome: Not Met (add Reason) Note: Inconsistent mastery, limited by poor carryover secondary to cognitive deficits and emotional lability with variable motivation to participate with therapies   Problem: RH Wheelchair Mobility Goal: LTG Patient will propel w/c in community environment (PT) Description: LTG: Patient will propel wheelchair in community environment, # of feet with assist (PT) Outcome: Not Met (add Reason) Note: Poor sequencing and carryover secondary to cognitive deficits

## 2023-07-20 NOTE — Progress Notes (Signed)
PROGRESS NOTE   Subjective/Complaints: No acute complaints.  No events overnight. Stable for discharge.  Pleasant, grateful for care.  ROS: limited due to cognition/depression + Sore throat-resolved + Depression-patient denying, endorsing passive SI to other staff  Objective:   No results found. Recent Labs    07/19/23 0628  WBC 5.7  HGB 12.6*  HCT 37.0*  PLT 186    Recent Labs    07/19/23 0628  NA 141  K 3.9  CL 103  CO2 27  GLUCOSE 94  BUN 17  CREATININE 1.13  CALCIUM 8.5*       Intake/Output Summary (Last 24 hours) at 07/20/2023 8295 Last data filed at 07/20/2023 0751 Gross per 24 hour  Intake 826 ml  Output 700 ml  Net 126 ml        Physical Exam: Vital Signs Blood pressure 132/64, pulse 61, temperature 98 F (36.7 C), temperature source Oral, resp. rate 15, height 5\' 11"  (1.803 m), weight 86.8 kg, SpO2 95%.     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 +TTP bilateral medial and lateral knee joint lines, no palpable effusion  Neurologic exam:  Moving all 4 extremities to gravity and against resistance Alert and awake, oriented to person, place, and partially to time with cues. - unchanged Obvious cognitive deficits, slow processing   Mild dysarthria and hypophonic voice, mild facial droop  speech fluent,  , Follows commands    Assessment/Plan: 1. Functional deficits which require 3+ hours per day of interdisciplinary therapy in a comprehensive inpatient  rehab setting. Physiatrist is providing close team supervision and 24 hour management of active medical problems listed below. Physiatrist and rehab team continue to assess barriers to discharge/monitor patient progress toward functional and medical goals  Care Tool:  Bathing    Body parts bathed by patient: Right arm, Left arm, Chest, Abdomen, Buttocks, Front perineal area, Right upper leg, Left upper leg, Right lower leg, Left lower leg, Face   Body parts bathed by helper: Right lower leg, Left lower leg     Bathing assist Assist Level: Minimal Assistance - Patient > 75%     Upper Body Dressing/Undressing Upper body dressing   What is the patient wearing?: Pull over shirt    Upper body assist Assist Level: Supervision/Verbal cueing    Lower Body Dressing/Undressing Lower body dressing      What is the patient wearing?: Underwear/pull up, Pants     Lower body assist Assist for lower body dressing: Minimal Assistance - Patient > 75%     Toileting Toileting    Toileting assist Assist for toileting: Minimal Assistance - Patient > 75%     Transfers Chair/bed transfer  Transfers assist     Chair/bed transfer assist level: Minimal Assistance - Patient > 75%  Locomotion Ambulation   Ambulation assist      Assist level: Minimal Assistance - Patient > 75% Assistive device: Walker-rolling Max distance: 50'   Walk 10 feet activity   Assist     Assist level: Minimal Assistance - Patient > 75% Assistive device: Walker-rolling   Walk 50 feet activity   Assist    Assist level: Minimal Assistance - Patient > 75% Assistive device: Walker-rolling    Walk 150 feet activity   Assist    Assist level: Minimal Assistance - Patient > 75% Assistive device: Walker-rolling    Walk 10 feet on uneven surface  activity   Assist     Assist level: Minimal Assistance - Patient > 75% Assistive device: Walker-rolling   Wheelchair     Assist Is the  patient using a wheelchair?: Yes Type of Wheelchair: Manual    Wheelchair assist level: Dependent - Patient 0% Max wheelchair distance: 150'    Wheelchair 50 feet with 2 turns activity    Assist        Assist Level: Dependent - Patient 0%   Wheelchair 150 feet activity     Assist      Assist Level: Dependent - Patient 0%   Blood pressure 132/64, pulse 61, temperature 98 F (36.7 C), temperature source Oral, resp. rate 15, height 5\' 11"  (1.803 m), weight 86.8 kg, SpO2 95%.  Medical Problem List and Plan: 1. Functional deficits secondary to ischemic infarction left internal capsule as well as history of prior left brain infarcts             -patient may  shower             -ELOS/Goals: 10-14 days -supervision - Estimated discharge 07/21/23             Stable for discharge from CIR  -7-26: Report per wife she fell at home and broke her wrist, is currently hospitalized.  No others caregiver support available at home.  - unable to do hands on assist but can do SPV   - 8/6: Min A with OT except when fatigued; will needs hands on care at home. Looking into hired caregivers at home vs ALF. SLP working on orientation and cognition, does well with basic expressions but defaults to "I dont know" with higher cognitive tasks.    2.  Antithrombotics: -DVT/anticoagulation:  Pharmaceutical: Lovenox             -antiplatelet therapy: Aspirin 81 mg daily and Brilinta 90 mg twice daily x 4 weeks then aspirin alone-since failed Plavix 3. Pain Management: Tylenol as needed 4. Mood/Behavior/Sleep: Namenda 10 mg twice daily.  Continue Trazodone 50 mg as needed for sleep.             -antipsychotic agents: Provide emotional support  -Endorsing some very mild passive SI to PT; continue fluoxetine 10 mg daily, scheduled for neuropsych eval - Pt with some lability but affect is otherwise normal endorses good appetite   - 8/8: Increase Prozac to 20 mg daily for treatment dose for  depression  8-9: Endorsing poor sleep, DC melatonin as this was not effective, scheduled trazodone 50 mg nightly.  No further medication adjustments due to recent increase in Prozac, monitor for serotonergic symptoms.  Did not feel psychiatry consult is appropriate due to lack of active SI.  Family to bring in photos this weekend.  8-13: Mood improved, depression likely complicated by ongoing dementia/memory deficits, continue monitoring as outpatient on increased Prozac  5. Neuropsych/cognition:  This patient is not capable of making decisions on his own behalf.  6. Skin/Wound Care: Routine skin checks  - Xeroform to R forearm  8/10- skin tears healed- con't to monitor skin  7. Fluids/Electrolytes/Nutrition: Routine and helpful follow-up chemistries  -Mild AKI on admission documentation, encourage p.o. intakes, recheck Monday  -7/27 continue encourage p.o. fluid intake  -728 recheck tomorrow BMP ordered  -7/30 creatinine/BUN up to 26/1.25, will start IVF normal saline  -8/1 creatinine and BUN improved  -8/2 IVF discontinued, monitor PO intake  -8/5 BUN/Cr stable at 18/1.07  8/11- labs Monday - ordered for AM-stable  8.  Hypertension.  Norvasc 5 mg daily, lisinopril 20 mg daily.  Monitor with increased mobility  7/30 will stop Norvasc  7/31 DC lisinopril 10mg   8/1 BP stable and appears orthostatic hypotension has improved, continue to monitor  8/10-8/11 BP controlled- cont' regimen    07/20/2023    5:23 AM 07/19/2023    7:48 PM 07/19/2023   12:49 PM  Vitals with BMI  Systolic 132 129 161  Diastolic 64 61 63  Pulse 61 66 67     9.  Hyperlipidemia.  Continue Lipitor  10.  BPH/Urinary urgency/frequency/incontinence- suggest timed voiding. Check PVR.  No documented retention  --7/28 remains continent, bladder scan yesterday 122 continue to monitor  -7/31 remains continent, monitor as he is on IVF and will likely need to urinate for frequentyl  Off IVF, stable, monitor.  11.  Aortic  stenosis status post TAVR March 2021 as well as history of CAD with stenting.  Follow-up outpatient with Dr. Gypsy Balsam.  12. Hx of mild dementia- continue Namenda  13.  Orthostatic hypotension -resolved with IV fluid, encouraging p.o.'s, and TED hose  -7/30 IVF normal saline 50 mL per hour and decrease Norvasc.  Abdominal binder and teds  -7/31 DC lisinopril, continue IVF  -8/2 monitor with DC IVF  8/4: BP reviewed and slightly elevated, will not add medication given history of orthostatic hypotension  - 8/6: BP dowentrendiong today; encourage POs , daily orthostats, abdominal binder, TEDs -some improvement today, monitor    14. Constipation.  Resolved   LOS: 19 days A FACE TO FACE EVALUATION WAS PERFORMED  Angelina Sheriff 07/20/2023, 9:22 AM

## 2023-07-20 NOTE — Plan of Care (Signed)
  Problem: RH Cognition - SLP Goal: RH LTG Patient will demonstrate orientation with cues Description:  LTG:  Patient will demonstrate orientation to person/place/time/situation with cues (SLP)   Outcome: Completed/Met   Problem: RH Comprehension Communication Goal: LTG Patient will comprehend basic/complex auditory (SLP) Description: LTG: Patient will comprehend basic/complex auditory information with cues (SLP). Outcome: Completed/Met   Problem: RH Expression Communication Goal: LTG Patient will increase speech intelligibility (SLP) Description: LTG: Patient will increase speech intelligibility at word/phrase/conversation level with cues, % of the time (SLP) Outcome: Completed/Met Goal: LTG Patient will increase word finding of common (SLP) Description: LTG:  Patient will increase word finding of common objects/daily info/abstract thoughts with cues using compensatory strategies (SLP). Outcome: Completed/Met

## 2023-08-02 NOTE — Progress Notes (Signed)
Carelink Summary Report / Loop Recorder 

## 2023-08-06 ENCOUNTER — Ambulatory Visit: Payer: Medicare HMO | Attending: Cardiology | Admitting: Cardiology

## 2023-08-06 ENCOUNTER — Encounter: Payer: Self-pay | Admitting: Cardiology

## 2023-08-06 VITALS — BP 110/74 | HR 74 | Resp 16 | Ht 71.0 in | Wt 183.0 lb

## 2023-08-06 DIAGNOSIS — I2510 Atherosclerotic heart disease of native coronary artery without angina pectoris: Secondary | ICD-10-CM | POA: Diagnosis not present

## 2023-08-06 DIAGNOSIS — I63412 Cerebral infarction due to embolism of left middle cerebral artery: Secondary | ICD-10-CM

## 2023-08-06 DIAGNOSIS — I10 Essential (primary) hypertension: Secondary | ICD-10-CM

## 2023-08-06 DIAGNOSIS — Z952 Presence of prosthetic heart valve: Secondary | ICD-10-CM

## 2023-08-06 NOTE — Patient Instructions (Signed)
Medication Instructions:   No changes   *If you need a refill on your cardiac medications before your next appointment, please call your pharmacy*   Lab Work:   None today   If you have labs (blood work) drawn today and your tests are completely normal, you will receive your results only by: MyChart Message (if you have MyChart) OR A paper copy in the mail If you have any lab test that is abnormal or we need to change your treatment, we will call you to review the results.   Testing/Procedures:   None today   Follow-Up: At [facility], you and your health needs are our priority.  As part of our continuing mission to provide you with exceptional heart care, we have created designated Provider Care Teams.  These Care Teams include your primary Cardiologist (physician) and Advanced Practice Providers (APPs -  Physician Assistants and Nurse Practitioners) who all work together to provide you with the care you need, when you need it.  We recommend signing up for the patient portal called "MyChart".  Sign up information is provided on this After Visit Summary.  MyChart is used to connect with patients for Virtual Visits (Telemedicine).  Patients are able to view lab/test results, encounter notes, upcoming appointments, etc.  Non-urgent messages can be sent to your provider as well.   To learn more about what you can do with MyChart, go to [email or web address].    Your next appointment:   6 month(s)  Provider:   Gypsy Balsam, MD    Other Instructions  Follow up in 6 months

## 2023-08-06 NOTE — Progress Notes (Signed)
Cardiology Office Note:  .   Date:  08/06/2023  ID:  Michael Kidd, DOB 05-Jun-1930, MRN [id number] PCP: Barron Alvine, MD  [facility] Providers Cardiologist:  Tonny Bollman, MD  Click to update primary MD,subspecialty MD or APP then REFRESH:1}   History of Present Illness: .   Michael Kidd is a 89 y.o. male with a past medical history of CAD s/p PCI, aortic stenosis s/p TAVR 2021, hypertension, vascular dementia, NSVT, history of CVA, GERD, BPH, presence of ILR.  07/18/23 ILR device check normal 06/24/23 echo EF 60-65%, mild LVH, grade I DD, mild > mod dilatation of LA 09/24/20 TAVR 08/22/20 R/LHC mid to distal LAD 75% stenosed, lesion previously treated using a DES of unknown type  Admitted 07/01/2023 [facility] with a stroke, he was ultimately discharged to a SNF.   He presents today accompanied by staff from rehab facility, he appears to be doing stable, he has some dementia and is not able to answer questions fully.  Per staff member, he is participating in rehab, making some progress. He denies chest pain, palpitations, dyspnea, pnd, orthopnea, n, v, dizziness, syncope, edema, weight gain, or early satiety.   ROS: Review of Systems  Constitutional: Negative.   HENT: Negative.    Eyes: Negative.   Respiratory: Negative.    Cardiovascular: Negative.   Gastrointestinal: Negative.   Genitourinary: Negative.   Musculoskeletal: Negative.   Skin: Negative.   Neurological:  Positive for weakness.  Endo/Heme/Allergies: Negative.   Psychiatric/Behavioral:  Positive for memory loss.      Studies Reviewed: .        Cardiac Studies & Procedures   CARDIAC CATHETERIZATION  CARDIAC CATHETERIZATION 08/22/2020  Narrative  There is moderate aortic valve stenosis.  1st Diag lesion is 60% stenosed.  Mid LAD lesion is 50% stenosed.  Mid LAD to Dist LAD lesion is 75% stenosed.  Essentially normal right heart cath pressures with mildly elevated LVEDP.  Likely  inaccurate measurement of aortic valve gradient estimated at 18 mmHg.  SUMMARY  Severe aortic stenosis by echocardiogram -> by cath, mean gradient 18 mmHg.  Severe vessel disease with 75 to 80% ISR in the mid LAD crossing 2nd Diag  Otherwise no significant CAD.  Relatively normal Right Heart Cath Pressures and Cardiac Index/Output  RAP 6 mmHg, RVP 23/0 mmHg-RVEDP 2 mmHg,  PAP 33/13 mmHg-mean 17 mmHg; PCWP 20-25 mmHg  Ao sat 90%, PA sat 75%; Cardiac Output-Index Hiram Comber) 6.38-2.95   RECOMMENDATIONS  Case was discussed with Dr. Excell Seltzer and Cline Crock, PA from the TAVR team.  Plan will be to discharge home set up follow-up with Dr. Excell Seltzer in the valve clinic to determine the appropriate course of action simultaneous versus staged PCI LAD and TAVR (potentially versus CABG/AVR)   Bryan Lemma, MD  Findings Coronary Findings Diagnostic  Dominance: Left  Left Main Vessel was injected. Vessel is large. The vessel is mildly calcified.  Left Anterior Descending Vessel is small. Ost LAD to Mid LAD lesion is 15% stenosed. The lesion is concentric. The lesion is severely calcified. Mid LAD lesion is 50% stenosed. The lesion is eccentric and irregular. Mid LAD to Dist LAD lesion is 75% stenosed. The lesion is segmental. The lesion was previously treated using a stent (unknown type) over 2 years ago. Previously placed stent displays restenosis.  First Diagonal Branch Vessel is small in size. 1st Diag lesion is 60% stenosed. The lesion is focal.  First Septal Branch Vessel is small in size.  Second Diagonal Branch Vessel is small in size.  Left Anterior Descending Vessel is small. Ost LAD to Mid LAD lesion is 15% stenosed. The lesion is concentric. The lesion is severely calcified. Mid LAD lesion is 50% stenosed. The lesion is eccentric and irregular. Mid LAD to Dist LAD lesion is 75% stenosed. The lesion is segmental. The lesion was previously treated using a stent (unknown  type) over 2 years ago. Previously placed stent displays restenosis.  First Diagonal Branch Vessel is small in size. 1st Diag lesion is 60% stenosed. The lesion is focal.  First Septal Branch Vessel is small in size.  Second Diagonal Branch Vessel is small in size.  Ramus Intermedius Vessel is normal in caliber. Normal to large in size The vessel is tortuous.  Left Circumflex Vessel is large.  First Obtuse Marginal Branch Vessel is small in size. The vessel is tortuous.  Second Obtuse Marginal Branch The vessel is tortuous.  Left Posterior Descending Artery Vessel is large in size.  Right Coronary Artery Vessel is small. Vessel is angiographically normal.  Right Ventricular Branch Vessel is small in size.  Intervention  No interventions have been documented.     ECHOCARDIOGRAM  ECHOCARDIOGRAM COMPLETE 06/24/2023  Narrative ECHOCARDIOGRAM REPORT    Patient Name:   Michael Kidd Date of Exam: 06/24/2023 Medical Rec #:  [id number]     Height:       71.0 in Accession #:    [id number]    Weight:       209.4 lb Date of Birth:  06-13-1930    BSA:          2.150 m Patient Age:    89 years      BP:           156/84 mmHg Patient Gender: M             HR:           71 bpm. Exam Location:  Inpatient  Procedure: 2D Echo, Color Doppler and Cardiac Doppler  Indications:    Stroke  History:        Patient has prior history of Echocardiogram examinations, most recent 01/04/2023. CAD, Aortic Valve Disease and s/p TAVR w/ on 09/24/20; Risk Factors:Dyslipidemia and Hypertension.  Sonographer:    Milbert Coulter Referring Phys: [id number] Cecille Po MELVIN  IMPRESSIONS   1. Left ventricular ejection fraction, by estimation, is 60 to 65%. The left ventricle has normal function. The left ventricle has no regional wall motion abnormalities. There is mild concentric left ventricular hypertrophy. Left ventricular diastolic parameters are consistent with Grade I  diastolic dysfunction (impaired relaxation). 2. Right ventricular systolic function is normal. The right ventricular size is normal. 3. Left atrial size was mild to moderately dilated. 4. The mitral valve is normal in structure. No evidence of mitral valve regurgitation. No evidence of mitral stenosis. 5. The aortic valve has been repaired/replaced. Aortic valve regurgitation is mild. No aortic stenosis is present. Echo findings are consistent with normal structure and function of the aortic valve prosthesis. Aortic valve mean gradient measures 9.0 mmHg. Aortic valve Vmax measures 2.10 m/s.  FINDINGS Left Ventricle: Left ventricular ejection fraction, by estimation, is 60 to 65%. The left ventricle has normal function. The left ventricle has no regional wall motion abnormalities. The left ventricular internal cavity size was normal in size. There is mild concentric left ventricular hypertrophy. Left ventricular diastolic parameters are consistent with Grade I diastolic dysfunction (impaired relaxation).  Right Ventricle: The  right ventricular size is normal. No increase in right ventricular wall thickness. Right ventricular systolic function is normal.  Left Atrium: Left atrial size was mild to moderately dilated.  Right Atrium: Right atrial size was normal in size.  Pericardium: There is no evidence of pericardial effusion.  Mitral Valve: The mitral valve is normal in structure. No evidence of mitral valve regurgitation. No evidence of mitral valve stenosis.  Tricuspid Valve: The tricuspid valve is normal in structure. Tricuspid valve regurgitation is mild . No evidence of tricuspid stenosis.  Aortic Valve: The aortic valve has been repaired/replaced. Aortic valve regurgitation is mild. Aortic regurgitation PHT measures 611 msec. No aortic stenosis is present. Aortic valve mean gradient measures 9.0 mmHg. Aortic valve peak gradient measures 17.6 mmHg. There is a 26 mm Sapien prosthetic,  stented (TAVR) valve present in the aortic position. Echo findings are consistent with normal structure and function of the aortic valve prosthesis.  Pulmonic Valve: The pulmonic valve was normal in structure. Pulmonic valve regurgitation is trivial. No evidence of pulmonic stenosis.  Aorta: The aortic root is normal in size and structure.  Venous: The inferior vena cava was not well visualized.  IAS/Shunts: No atrial level shunt detected by color flow Doppler.   LEFT VENTRICLE PLAX 2D LVIDd:         3.50 cm Diastology LVIDs:         2.80 cm LV e' medial:    5.55 cm/s LV PW:         1.40 cm LV E/e' medial:  8.0 LV IVS:        1.40 cm LV e' lateral:   7.18 cm/s LV E/e' lateral: 6.2   RIGHT VENTRICLE RV S prime:     11.60 cm/s TAPSE (M-mode): 1.7 cm  LEFT ATRIUM             Index        RIGHT ATRIUM           Index LA Vol (A2C):   58.4 ml 27.16 ml/m  RA Area:     17.30 cm LA Vol (A4C):   52.2 ml 24.28 ml/m  RA Volume:   45.20 ml  21.02 ml/m LA Biplane Vol: 56.9 ml 26.46 ml/m AORTIC VALVE AV Vmax:           210.00 cm/s AV Vmean:          139.000 cm/s AV VTI:            0.407 m AV Peak Grad:      17.6 mmHg AV Mean Grad:      9.0 mmHg LVOT Vmax:         93.80 cm/s LVOT Vmean:        61.300 cm/s LVOT VTI:          0.202 m LVOT/AV VTI ratio: 0.50 AI PHT:            611 msec  AORTA Ao Asc diam: 3.30 cm  MITRAL VALVE               TRICUSPID VALVE MV Area (PHT): 3.53 cm    TR Peak grad:   22.8 mmHg MV Decel Time: 215 msec    TR Vmax:        239.00 cm/s MV E velocity: 44.50 cm/s MV A velocity: 99.60 cm/s  SHUNTS MV E/A ratio:  0.45        Systemic VTI: 0.20 m  Arvilla Meres MD Electronically signed by Reuel Boom  Bensimhon MD Signature Date/Time: 06/24/2023/1:04:46 PM    Final    MONITORS  LONG TERM MONITOR-LIVE TELEMETRY (3-14 DAYS) 10/02/2020  Narrative ZARED CAMELO, DOB May 17, 1930, MRN [id number]  HOLTER MONITOR REPORT:    Date of test:                  10/02/2020 Duration of test:           13 days Indication:                    Left bundle branch block Ordering physician:  Georgeanna Lea, MD Referring physician:  Georgeanna Lea, MD   Baseline rhythm: Sinus rhythm  Minimum heart rate: 42 BPM.  Average heart rate: 66 BPM.  Maximal heart rate 118 BPM.  Atrial arrhythmia: Occasional PAC, total burden was 1.7%, 14 episodes of supraventricular tachycardia with the fastest episode 6 beats at rate of 167, longest episode 9 beats at rate of 102.  Ventricular arrhythmia: Rare episodes of PVCs s with few ventricular couplets, 1 episode of nonsustained ventricular tachycardia but only 4 beats at rate of 140 bpm  Conduction abnormality: First-degree AV block  Symptoms: None   Conclusion: Short runs of nonsustained ventricular tachycardia. Occasional supraventricular ectopy with 14 runs of supraventricular tachycardia, asymptomatic  Interpreting  cardiologist: Gypsy Balsam, MD Date: 10/24/2020 4:55 PM   CT SCANS  CT CORONARY MORPH W/CTA COR W/SCORE 09/13/2020  Addendum 09/15/2020  5:56 PM ADDENDUM REPORT: 09/15/2020 17:53  CLINICAL DATA:  89 year old male with severe aortic stenosis being evaluated for a TAVR procedure.  EXAM: Cardiac TAVR CT  TECHNIQUE: The patient was scanned on a [employer]. A 120 kV retrospective scan was triggered in the descending thoracic aorta at 111 HU's. Gantry rotation speed was 250 msecs and collimation was .6 mm. No beta blockade or nitro were given. The 3D data set was reconstructed in 5% intervals of the R-R cycle. Systolic and diastolic phases were analyzed on a dedicated work station using MPR, MIP and VRT modes. The patient received 80 cc of contrast.  FINDINGS: Aortic Valve: Trileaflet aortic valve with severely calcified leaflets and severely restricted leaflet opening. Only trivial calcifications are extending into the LVOT. Aortic valve calcium score is 2667  consistent with severe aortic stenosis.  Aorta: Normal size with mild diffuse atherosclerotic plaque and calcifications and no dissection.  Sinotubular Junction: 29 x 28 mm  Ascending Thoracic Aorta: 32 x 32 mm  Aortic Arch: 28 x 26 mm  Descending Thoracic Aorta: 27 x 27 mm  Sinus of Valsalva Measurements:  Non-coronary: 34 mm  Right -coronary: 34 mm  Left -coronary: 33 mm  Coronary Artery Height above Annulus:  Left Main: 17 mm  Right Coronary: 18 mm  Virtual Basal Annulus Measurements: (Measured at 20%)  Maximum/Minimum Diameter: 27.8 x 23.9 mm  Mean Diameter: 25.4 mm  Perimeter: 81.5 mm  Area: 507 mm2  Optimum Fluoroscopic Angle for Delivery:  RAO 12 CRA 5  IMPRESSION: 1. Trileaflet aortic valve with severely calcified leaflets and severely restricted leaflet opening. Only trivial calcifications are extending into the LVOT. Aortic valve calcium score is 2667 consistent with severe aortic stenosis. Annular measurements suitable for delivery of a 26 mm Edwards-SAPIEN 3 Ultra valve.  2. Sufficient coronary to annulus distance.  3. Optimum Fluoroscopic Angle for Delivery: RAO 12 CRA 5  4. No thrombus in the left atrial appendage.   Electronically Signed By: Tobias Alexander On: 09/15/2020 17:53  Narrative EXAM:  OVER-READ INTERPRETATION  CT CHEST  The following report is an over-read performed by radiologist Dr. Trudie Reed of [facility] on 09/13/2020. This over-read does not include interpretation of cardiac or coronary anatomy or pathology. The coronary calcium score/coronary CTA interpretation by the cardiologist is attached.  COMPARISON:  None.  FINDINGS: Extracardiac findings will be described separately under dictation for contemporaneously obtained CTA chest, abdomen and pelvis.  IMPRESSION: Please see separate dictation for contemporaneously obtained CTA chest, abdomen and pelvis dated 09/13/2020 for full description  of relevant extracardiac findings.  Electronically Signed: By: Trudie Reed M.D. On: 09/13/2020 10:27          Risk Assessment/Calculations:             Physical Exam:   VS:  BP 110/74 (BP Location: Left Arm, Patient Position: Sitting)   Pulse 74   Resp 16   Ht 5\' 11"  (1.803 m)   Wt 183 lb (83 kg)   SpO2 98%   BMI 25.52 kg/m    Wt Readings from Last 3 Encounters:  08/06/23 183 lb (83 kg)  07/15/23 191 lb 5.8 oz (86.8 kg)  06/23/23 209 lb 7 oz (95 kg)    GEN: Well nourished, well developed in no acute distress NECK: No JVD; No carotid bruits CARDIAC: RRR, no murmurs, rubs, gallops RESPIRATORY:  Clear to auscultation without rales, wheezing or rhonchi  ABDOMEN: Soft, non-tender, non-distended EXTREMITIES:  No edema; No deformity   ASSESSMENT AND PLAN: .   Coronary artery disease-s/p PCI in the past--unclear date to midLAD. Stable with no anginal symptoms. No indication for ischemic evaluation.  Continue aspirin 81 mg daily, continue Lipitor 80 mg daily.   S/p AVR-most recent echo this year revealed normal structure and function of the aortic valve prosthesis, mean gradient 9.0 mmHg.   CVA - has ILR, recent review did not reveal atrial fibrillation. Continue ASA 81 mg daily.   Dyslipidemia - recent LDL was well controlled at 50 in July 2024, continue Lipitor 80 mg daily.           Dispo:  Follow up in 6 months.   Signed, Flossie Dibble, NP

## 2023-08-22 LAB — CUP PACEART REMOTE DEVICE CHECK
Date Time Interrogation Session: 20240912230618
Implantable Pulse Generator Implant Date: 20200921

## 2023-08-23 ENCOUNTER — Ambulatory Visit (INDEPENDENT_AMBULATORY_CARE_PROVIDER_SITE_OTHER): Payer: Medicare HMO

## 2023-08-23 DIAGNOSIS — I63412 Cerebral infarction due to embolism of left middle cerebral artery: Secondary | ICD-10-CM | POA: Diagnosis not present

## 2023-09-06 ENCOUNTER — Encounter: Payer: Self-pay | Admitting: Physical Medicine and Rehabilitation

## 2023-09-06 ENCOUNTER — Encounter: Payer: Medicare HMO | Attending: Physical Medicine and Rehabilitation | Admitting: Physical Medicine and Rehabilitation

## 2023-09-06 VITALS — BP 103/69 | HR 67 | Ht 69.0 in | Wt 181.0 lb

## 2023-09-06 DIAGNOSIS — I69398 Other sequelae of cerebral infarction: Secondary | ICD-10-CM | POA: Diagnosis present

## 2023-09-06 DIAGNOSIS — G8929 Other chronic pain: Secondary | ICD-10-CM | POA: Diagnosis present

## 2023-09-06 DIAGNOSIS — M720 Palmar fascial fibromatosis [Dupuytren]: Secondary | ICD-10-CM

## 2023-09-06 DIAGNOSIS — F0153 Vascular dementia, unspecified severity, with mood disturbance: Secondary | ICD-10-CM | POA: Diagnosis present

## 2023-09-06 DIAGNOSIS — R1031 Right lower quadrant pain: Secondary | ICD-10-CM | POA: Diagnosis present

## 2023-09-06 DIAGNOSIS — I639 Cerebral infarction, unspecified: Secondary | ICD-10-CM

## 2023-09-06 DIAGNOSIS — R5383 Other fatigue: Secondary | ICD-10-CM | POA: Diagnosis present

## 2023-09-06 NOTE — Progress Notes (Signed)
Subjective:    Patient ID: Michael Kidd, male    DOB: 17-Feb-1930, 89 y.o.   MRN: [id number]  HPI   Michael Kidd is a 89 y.o. 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), Chronic pain of both knees (09/01/2017), Coronary artery disease with PCI and stenting many years ago (10/19/2019), Depression (02/14/2014), Dizziness (03/12/2014), Dupuytren's contracture (07/15/2016), Dyspnea on exertion (08/15/2020), Dysthymia, Essential hypertension (03/12/2014), Flexor tendon bowstring (01/06/2016), Gastroesophageal reflux disease (08/18/2016), Hepatic steatosis (03/02/2014), History of CVA (cerebrovascular accident) (10/12/2019), HLD (hyperlipidemia) (02/14/2014), HTN (hypertension), Late effect of cerebrovascular accident (CVA) (10/19/2019), Melanoma in situ of right upper extremity including shoulder (HCC) (08/07/2016), Murmur (05/23/2020), Myocardial infarction [facility]), Neuropathic pain, leg, bilateral (11/23/2017), Nonsustained ventricular tachycardia (HCC) (11/12/2020), Pure hypercholesterolemia (09/14/2018), Quadriceps weakness (09/01/2017), Renal cyst, Risk for falls (10/01/2016), 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  Per discharge documentation:  Brief HPI:   Michael Kidd is a 89 y.o. right-handed male with history of hypertension, BPH, aortic stenosis status post  TAVR 2021, CAD with stenting hyperlipidemia tobacco use history of CVA January 2024 maintained on Plavix with workup completed at [facility].  Patient also with mild dementia on Namenda and depression.  Per chart review lives with spouse.  Wife assist with ADLs.  Presented 06/23/2019 24th right sided weakness and aphasia.  CT/MRI showed small acute area of ischemia of the left corpus stratum.  No hemorrhage or mass effect.  Old right parietal and occipital infarcts as well as old small vessel infarct of the basal ganglia cerebellum and corona radiata.  CTA showed no large vessel occlusion or hemodynamically significant stenosis.  Admission chemistry is unremarkable except potassium 3.4 glucose 136 hemoglobin A1c 5.7.  Echocardiogram with ejection fraction of 60 to 65% no wall motion abnormalities grade 1 diastolic dysfunction.  Neurology follow-up maintained on low-dose aspirin as well as Brilinta.  Lovenox added for DVT prophylaxis.  Therapy evaluations completed due to patient decreased functional mobility right sided weakness was admitted for comprehensive rehab program.     Hospital Course: Michael Kidd was admitted to rehab 87/25/2024 for inpatient therapies to consist of PT, ST and OT at least three hours five days a week. Past admission physiatrist, therapy team and rehab RN have worked together to provide customized collaborative inpatient rehab.  Pertaining to patient's ischemic infarction left internal capsule as well as history of prior left brain infarct remained stable.  Remained on aspirin and Brilinta x 4 weeks then aspirin alone since failed Plavix in the past.  Subcutaneous Lovenox for DVT prophylaxis no bleeding episodes.  Bouts of hypotension receiving IV normal saline 50 mL an hour 7/30 and Norvasc decreased.  Lisinopril discontinued IV fluids completed 7/31.  History of mild dementia remained on Namenda.  BPH with urinary frequency and voiding without difficulty.  History of aortic  stenosis TAVR procedure 2021 as well as CAD with stenting.  Follow-up outpatient with Dr. Gypsy Balsam.     Blood pressures  were monitored on TID basis and soft and monitored     Rehab course: During patient's stay in rehab weekly team conferences were held to monitor patient's progress, set goals and discuss barriers to discharge. At admission, patient required minimal assist 130 feet with rolling walker minimal assist step pivot transfers    Interval Hx:  - Therapies: Still in SLP; finished PT and OT.   - Follow ups: saw cardiology 8/30.   - Falls: One per the nurse; [age] was when he first got the the facility. He has not had a fall since.   - BJY:NWGNFA wheelchair dependent per nurse  - Medications: No major changes since discharge. On medication review, wife endorses Hx medication reaction to antidepressants with fatigue and dizziness in the past that resolved with stopping them. Unsure which he was on.   - Other concerns: Complaining of pain in his right lower abdomen; went to the ER last week for this and was attributed to IBS. He complains of ongoing, intermittent sever RLQ pain with extended sitting, releived by laying down. No SOB, chest pain.    Spoke with patient's wife over the phone, she feels he was doing well when he got out of the hospital but a few weeks after that slowed down significantly. She states he comes back to her home for visits every weekend and that he mostly sits in a chair, does not have the energy to get out of a chair or move most of the time. Some of this is motivational, but mostly she notices he is just much more lethargic. She is unsure why he was discharged from PT/OT. She does not feel he is at a functional level where they could consider bringing him home  When discussing long term planning, wife quickly becomes overwhelmed and says "I dont know" to all questions regarding patient dispo. She becomes tearful and states current plan is for him to continue  living at current SNF. Per nurse, they do not have a memory care unit, but do feel patient is appropriate for their facility despite his dementia.    Pain Inventory Average Pain 0 Pain Right Now 8 My pain is intermittent and sharp  LOCATION OF PAIN  right abd/flank  BOWEL Incontinent Yes   BLADDER Pads Bladder incontinence Yes   Mobility walk with assistance use a wheelchair needs help with transfers  Function I need assistance with the following:  dressing, bathing, toileting, meal prep, household duties, and shopping  Neuro/Psych bladder control problems bowel control problems trouble walking dizziness  Prior Studies Any changes since last visit?  yes  having pain in right abd/flank.c/o dizziness too.  Has been to the ED x2 since being discharged from inpt rehab  Physicians involved in your care Any changes since last visit?  no   Family History  Problem Relation Age of Onset   Diabetes Mother    Stroke Father    Social History   Socioeconomic History   Marital status: Married    Spouse name: Not on file   Number of children: Not on file   Years of education: Not on file   Highest education level: Not on file  Occupational History   Not on file  Tobacco Use   Smoking status: Former   Smokeless tobacco: Never  Vaping Use   Vaping status: Never Used  Substance and Sexual Activity   Alcohol use: Not Currently   Drug use: Never   Sexual activity: Not on file  Other Topics Concern  Not on file  Social History Narrative   Not on file   Social Determinants of Health   Financial Resource Strain: Not on file  Food Insecurity: No Food Insecurity (06/23/2023)   Hunger Vital Sign    Worried About Running Out of Food in the Last Year: Never true    Ran Out of Food in the Last Year: Never true  Transportation Needs: No Transportation Needs (06/23/2023)   PRAPARE - Administrator, Civil Service (Medical): No    Lack of Transportation  (Non-Medical): No  Physical Activity: Not on file  Stress: Not on file  Social Connections: Not on file   Past Surgical History:  Procedure Laterality Date   CATARACT EXTRACTION W/ INTRAOCULAR LENS  IMPLANT, BILATERAL     cataract repair     HERNIA REPAIR     unsure of date   LOOP RECORDER INSERTION N/A 08/28/2019   Procedure: LOOP RECORDER INSERTION;  Surgeon: Marinus Maw, MD;  Location: [facility] INVASIVE CV LAB;  Service: Cardiovascular;  Laterality: N/A;   PERCUTANEOUS CORONARY STENT INTERVENTION (PCI-S)     RIGHT/LEFT HEART CATH AND CORONARY ANGIOGRAPHY N/A 08/22/2020   Procedure: RIGHT/LEFT HEART CATH AND CORONARY ANGIOGRAPHY;  Surgeon: Marykay Lex, MD;  Location: [facility] INVASIVE CV LAB;  Service: Cardiovascular;  Laterality: N/A;   TEE WITHOUT CARDIOVERSION N/A 09/24/2020   Procedure: TRANSESOPHAGEAL ECHOCARDIOGRAM (TEE);  Surgeon: Tonny Bollman, MD;  Location: [facility] OR;  Service: Open Heart Surgery;  Laterality: N/A;   TRANSCATHETER AORTIC VALVE REPLACEMENT, TRANSFEMORAL N/A 09/24/2020   Procedure: TRANSCATHETER AORTIC VALVE REPLACEMENT, TRANSFEMORAL - USING EDWARDS SAPIEN 3 ULTRA  VALVE SIZE ;  Surgeon: Tonny Bollman, MD;  Location: [facility] OR;  Service: Open Heart Surgery;  Laterality: N/A;   Past Medical History:  Diagnosis Date   Allergic rhinitis 03/29/2014   Aortic valve stenosis 05/23/2020   Arthralgia 07/15/2016   Arthritis    Benign prostatic hyperplasia 02/14/2014   Benign prostatic hyperplasia with urinary frequency 11/02/2018   Bilateral chronic knee pain    BPH (benign prostatic hyperplasia)    CAD (coronary artery disease)    s/p remote LAD PCI   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 PHQ-9 completed 02/26/14 PHQ-2 completed 02/14/14   Dizziness 03/12/2014   Dupuytren's  contracture 07/15/2016   Dyspnea on exertion 08/15/2020   Dysthymia    Essential hypertension 03/12/2014   Flexor tendon bowstring 01/06/2016   Gastroesophageal reflux disease 08/18/2016   Hepatic steatosis 03/02/2014   History of CVA (cerebrovascular accident) 10/12/2019   HLD (hyperlipidemia) 02/14/2014   HTN (hypertension)    Late effect of cerebrovascular accident (CVA) 10/19/2019   Melanoma in situ of right upper extremity including shoulder (HCC) 08/07/2016   Formatting of this note might be different from the original. August 2017.  Dr. Chancy Milroy, [facility].   Murmur 05/23/2020   Myocardial infarction [facility])    Neuropathic pain, leg, bilateral 11/23/2017   Nonsustained ventricular tachycardia (HCC) 11/12/2020   Pure hypercholesterolemia 09/14/2018   Quadriceps weakness 09/01/2017   Renal cyst    Risk for falls 10/01/2016   S/P TAVR (transcatheter aortic valve replacement) 09/24/2020   Short of breath on exertion    per patient   Stroke [facility])    November   Symptomatic severe  aortic stenosis with normal ejection fraction 08/15/2020   BP 103/69   Pulse 67   Ht 5\' 9"  (1.753 m)   Wt 181 lb (82.1 kg)   SpO2 92%   BMI 26.73 kg/m   Opioid Risk Score:   Fall Risk Score:  `1  Depression screen [facility] 2/9     09/06/2023    2:40 PM  Depression screen PHQ 2/9  Decreased Interest 1  Down, Depressed, Hopeless 1  PHQ - 2 Score 2  Altered sleeping 0  Tired, decreased energy 2  Change in appetite 0  Feeling bad or failure about yourself  0  Trouble concentrating 0  Moving slowly or fidgety/restless 0  Suicidal thoughts 0  PHQ-9 Score 4    Review of Systems  Constitutional: Negative.   HENT: Negative.    Eyes: Negative.   Respiratory: Negative.    Cardiovascular: Negative.   Gastrointestinal:  Positive for abdominal pain.       Incontinent  Endocrine: Negative.   Genitourinary:        Incontinent  Musculoskeletal:  Positive for gait problem.  Skin: Negative.    Allergic/Immunologic: Negative.   Neurological:  Positive for dizziness and weakness.  Hematological: Negative.   Psychiatric/Behavioral: Negative.    All other systems reviewed and are negative.      Objective:   Physical Exam  PE: Constitution: Appropriate appearance for age. No apparent distress   Resp: No respiratory distress. No accessory muscle usage. on RA and CTAB Cardio: Well perfused appearance. No peripheral edema. Abdomen: Nondistended. Nontender to palpation in all 4 quadrants. Does have small palpable hernia in R lateral abdomen but no palpable bowel or fluctuance, and area is minimally tender.  Psych: Appropriate mood, flat affect, masked facies.  Neuro: Awake, alert, oriented only to self. Mild dysarthria. CN 2-12 grossly intact.   Neurologic Exam:   DTRs: Reflexes were 2+ in bilateral achilles, patella, biceps, BR and triceps. Babinsky: flexor responses b/l.   Hoffmans: negative b/l Sensory exam: revealed normal sensation in all dermatomal regions in bilateral upper extremities and bilateral lower extremities Motor exam: strength 5/5 throughout bilateral upper extremities and bilateral lower extremities Coordination: Fine motor coordination was normal.   Gait: not observed due to safety concerns  MSK: Multiple OA related deformities in fingers of both hands. PROM WNL bilaterally.        Assessment & Plan:   Michael Kidd is a 89 y.o. 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), Chronic pain of both knees (09/01/2017), Coronary artery disease with PCI and stenting many years ago (10/19/2019), Depression (02/14/2014),  Dizziness (03/12/2014), Dupuytren's contracture (07/15/2016), Dyspnea on exertion (08/15/2020), Dysthymia, Essential hypertension (03/12/2014), Flexor tendon bowstring (01/06/2016), Gastroesophageal reflux disease (08/18/2016), Hepatic steatosis (03/02/2014), History of CVA (cerebrovascular accident) (10/12/2019), HLD (hyperlipidemia) (02/14/2014), HTN (hypertension), Late effect of cerebrovascular accident (CVA) (10/19/2019), Melanoma in situ of right upper extremity including shoulder (HCC) (08/07/2016), Murmur (05/23/2020), Myocardial infarction [facility]), Neuropathic pain, leg, bilateral (11/23/2017), Nonsustained ventricular tachycardia (HCC) (11/12/2020), Pure hypercholesterolemia (09/14/2018), Quadriceps weakness (09/01/2017), Renal cyst, Risk for falls (10/01/2016), 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 .  Acute CVA (cerebrovascular accident) [facility]) Vascular dementia with depressed mood (HCC) Was discharged from PT/OT for unknown reasons, continues SLP. Resume PT when possible due to functional decline.  Currently at SNF, challenging dispo plan due to ongoing fatigue and dementia; discussed with family need to consider long-term placement if unable to reasonably return home at functional level. Wife understanding.   Follow up in 3 months  - will send today's note to Dr. Pearlean Brownie  Fatigue as sequela of cerebrovascular accident (CVA) -     CBC -     Basic metabolic panel -     TSH -     T4, free -     Magnesium -     Iron, TIBC and Ferritin Panel Due to endorsed Hx dizziness and fatuge in antidepressants, Dced trazadone and wean Prozac over 2 weeks  Labs  ordered for fatigue; results significant for AKI. Facility called and labs faxed over for review by their medical director, with recommendation for fluids either PO or IV.   Dupuytren's contracture Long-standing, not currently functionally limiting as he is mostly wheelchair dependent. Reasonable to monitor for now.   Abdominal pain, chronic, right lower quadrant Small palpable hernia, otherwise nonspecific pain not reproducible on exam Recent ER visit determined likely IBS; continue current treatment, food diary; no concerning s/s on today's exam

## 2023-09-06 NOTE — Patient Instructions (Signed)
Due to endorsed Hx dizziness and fatuge in antidepressants, Dced trazadone and wean Prozac over 2 weeks  Resume PT when possible  Labs ordered for fatigue  Follow up in 3 months - will send today's note to Dr. Pearlean Brownie for review at appt Thursday

## 2023-09-06 NOTE — Progress Notes (Deleted)
PE: General: awake, alert, calm, laying in bed HENT:  oropharynx moist CV: regular rate and rhythm; no JVD Pulmonary: CTA B/L; no W/R/R- good air movement GI: soft, NT, ND, (+)BS Psychiatric: sleepy, but calm- just depressed affect Neurological: doesn't know why in hospital- upset when heard had stroke Psych: pleasant, normal affect; mildly flat Skin: Ecchymosis bilateral upper extremities, otherwise stable   MSK: Multiple OA related deformities in fingers of both hands/. +TTP bilateral medial and lateral knee joint lines, no palpable effusion   Neurologic exam:  Moving all 4 extremities to gravity and against resistance Alert and awake, oriented to person, place, and partially to time with cues. - unchanged Obvious cognitive deficits, slow processing   Mild dysarthria and hypophonic voice, mild facial droop  speech fluent,  , Follows commands   ***   Michael Kidd is a 89 y.o. 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), Chronic pain of both knees (09/01/2017), Coronary artery disease with PCI and stenting many years ago (10/19/2019), Depression (02/14/2014), Dizziness (03/12/2014), Dupuytren's contracture (07/15/2016), Dyspnea on exertion (08/15/2020), Dysthymia, Essential hypertension (03/12/2014), Flexor tendon bowstring (01/06/2016), Gastroesophageal reflux disease (08/18/2016), Hepatic steatosis (03/02/2014), History of CVA (cerebrovascular accident) (10/12/2019), HLD (hyperlipidemia) (02/14/2014), HTN (hypertension), Late effect of cerebrovascular accident (CVA) (10/19/2019), Melanoma in situ of right upper extremity including shoulder (HCC) (08/07/2016),  Murmur (05/23/2020), Myocardial infarction [facility]), Neuropathic pain, leg, bilateral (11/23/2017), Nonsustained ventricular tachycardia (HCC) (11/12/2020), Pure hypercholesterolemia (09/14/2018), Quadriceps weakness (09/01/2017), Renal cyst, Risk for falls (10/01/2016), 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  There are no diagnoses linked to this encounter.

## 2023-09-06 NOTE — Progress Notes (Signed)
Carelink Summary Report / Loop Recorder 

## 2023-09-07 ENCOUNTER — Telehealth: Payer: Self-pay

## 2023-09-07 LAB — BASIC METABOLIC PANEL
BUN/Creatinine Ratio: 9 — ABNORMAL LOW (ref 10–24)
BUN: 16 mg/dL (ref 10–36)
CO2: 23 mmol/L (ref 20–29)
Calcium: 9.4 mg/dL (ref 8.6–10.2)
Chloride: 102 mmol/L (ref 96–106)
Creatinine, Ser: 1.74 mg/dL — ABNORMAL HIGH (ref 0.76–1.27)
Glucose: 125 mg/dL — ABNORMAL HIGH (ref 70–99)
Potassium: 4.7 mmol/L (ref 3.5–5.2)
Sodium: 141 mmol/L (ref 134–144)
eGFR: 37 mL/min/{1.73_m2} — ABNORMAL LOW (ref >59)

## 2023-09-07 LAB — CBC
Hematocrit: 44.7 % (ref 37.5–51.0)
Hemoglobin: 14.9 g/dL (ref 13.0–17.7)
MCH: 31.7 pg (ref 26.6–33.0)
MCHC: 33.3 g/dL (ref 31.5–35.7)
MCV: 95 fL (ref 79–97)
Platelets: 256 10*3/uL (ref 150–450)
RBC: 4.7 x10E6/uL (ref 4.14–5.80)
RDW: 13.3 % (ref 11.6–15.4)
WBC: 8.1 10*3/uL (ref 3.4–10.8)

## 2023-09-07 LAB — IRON,TIBC AND FERRITIN PANEL
Ferritin: 352 ng/mL (ref 30–400)
Iron Saturation: 33 % (ref 15–55)
Iron: 96 ug/dL (ref 38–169)
Total Iron Binding Capacity: 292 ug/dL (ref 250–450)
UIBC: 196 ug/dL (ref 111–343)

## 2023-09-07 LAB — TSH: TSH: 4.46 u[IU]/mL (ref 0.450–4.500)

## 2023-09-07 LAB — MAGNESIUM: Magnesium: 2.4 mg/dL — ABNORMAL HIGH (ref 1.6–2.3)

## 2023-09-07 LAB — T4, FREE: Free T4: 1.22 ng/dL (ref 0.82–1.77)

## 2023-09-07 NOTE — Telephone Encounter (Signed)
Michael Kidd, April L Can we please contact this patient's care facility and let them know his labs show he has a kidney injury and will likely need fluids? We will also need to fax them his full lab results from yesterday. This is somewhat urgent and needs done today. Their contact it:   Results called and fax to [facility]. Phone [phone], Fax [phone]. Attention : Margo Aye NP & Nurse Haliey at 2:42 pm.

## 2023-09-07 NOTE — Telephone Encounter (Signed)
Task completed

## 2023-09-09 ENCOUNTER — Encounter: Payer: Self-pay | Admitting: Neurology

## 2023-09-09 ENCOUNTER — Ambulatory Visit: Payer: Medicare HMO | Admitting: Neurology

## 2023-09-09 VITALS — BP 114/57 | HR 81 | Ht 66.0 in | Wt 180.0 lb

## 2023-09-09 DIAGNOSIS — I6381 Other cerebral infarction due to occlusion or stenosis of small artery: Secondary | ICD-10-CM

## 2023-09-09 DIAGNOSIS — F0150 Vascular dementia without behavioral disturbance: Secondary | ICD-10-CM

## 2023-09-09 DIAGNOSIS — G309 Alzheimer's disease, unspecified: Secondary | ICD-10-CM | POA: Diagnosis not present

## 2023-09-09 DIAGNOSIS — F0280 Dementia in other diseases classified elsewhere without behavioral disturbance: Secondary | ICD-10-CM

## 2023-09-09 NOTE — Telephone Encounter (Signed)
error 

## 2023-09-09 NOTE — Progress Notes (Signed)
[facility] [address] [place]. [place] [address] [phone]       OFFICE FOLLOW-UP NOTE  [name] Date of Birth:  1930/08/08 Medical Record Number:  [id number]   HPI: Initial visit 03/23/2023 ;Michael Kidd is a 89 year old pleasant Caucasian male seen today for initial office consultation visit for stroke.  He is accompanied by his grandson.  History is obtained from them and review of referral notes and I personally reviewed available pertinent imaging films in PACS.  He has a past medical history of hypertension, hyperlipidemia, gastroesophageal reflux disease, prior stroke in November 2020, coronary artery disease, arthritis and aortic stenosis.  He presented to [facility] in January 2024 with confusion and disorientation.  Admission labs were unremarkable he had only low-grade fever and blood pressure slightly elevated 177/86.  UA was normal and respiratory panel negative.  Chest x-ray was clear.  CT head showed no acute abnormality.  MRI scan showed an acute left thalamic lacunar infarct.  He was seen by teleneurology recommended dual antiplatelet therapy of aspirin and Plavix for 21 days and then.  He was seen by therapy recommended home physical and Occupational Therapy at discharge.  B12 levels and thyroid hormone levels.  Echocardiogram showed ejection fraction of 55 to 60%.  Carotid ultrasound showed no significant  CT angiogram of the brain and neck showed no significant large vessel stenosis or occlusion.  LDL cholesterol was 152 mg percent. Patient has finished home physical Occupational Therapy.  Lives at home with his wife.  He was previously walking using a stick and now is walking with a walker.  He is still confused and has not returned back to his baseline and is having memory and cognitive difficulties per the grandson.  The patient himself is unable to give any meaningful history does not remember a lot of things and was not fully cooperative or  able to complete the Mini-Mental status testing exam.  He apparently was driving prior to the stroke and had some mild memory difficulties but had never been evaluated for it.  He has had no recurrent stroke or TIA symptoms.  He is tolerating aspirin and Plavix well with only minor bruising and no bleeding.  His blood pressure is well-controlled.  He is tolerating Lipitor well without muscle aches and pains.  Update 09/09/2023 ; he returns for follow-up today after last visit with me nearly 6 months ago.  He is accompanied by his nursing home caregiver.  Patient was readmitted to the hospital with another stroke in July 2024.  He presented on 06/23/2023 after being found confused at home.  The wife found him to be difficult to wake him up that morning and he had been slow to respond to questions.  Patient was confused and could not distinguish between the TV and remote in the chair.  He fell back asleep and was difficult to wake him back up.  Subsequently was found to have garbled speech with facial drooping and EMS was called.  He had NIH stroke scale of 6 on admission.  CT head showed no acute abnormality.  CT angiogram of the head and neck showed no large vessel stenosis or occlusion.  MRI scan showed an acute stroke involving left corpus titan and old right parietal and occipital infarcts were noted.  2D echo showed ejection fraction of 60 to 65% with grade 1 diastolic dysfunction with moderate dilatation of the left atrium.  LDL cholesterol 50 mg percent and hemoglobin A1c was 5.8.  Patient was on Plavix prior to admission and was switched to aspirin and Brilinta for 4 weeks followed by aspirin.  He was transferred to rehab but has not done well.  He is currently in a nursing facility and has 24-hour care.  He is not able to get up and walk even with therapist.  He is pleasant and cooperative.  He has significant short-term memory and cognitive cognitive difficulties.  He does go home on weekends to spend time  with his wife and his family.  He is now on aspirin alone and tolerating well without side effects.  He is on Namenda for his dementia.  He has had no further recurrent stroke or TIA symptoms. 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 PHQ-9 completed 02/26/14 PHQ-2 completed 02/14/14   Dizziness 03/12/2014   Dupuytren's contracture 07/15/2016   Dyspnea on exertion 08/15/2020   Dysthymia    Essential hypertension 03/12/2014   Flexor tendon bowstring 01/06/2016   Gastroesophageal reflux disease 08/18/2016   Hepatic steatosis 03/02/2014   History of CVA (cerebrovascular accident) 10/12/2019   HLD (hyperlipidemia) 02/14/2014   HTN (hypertension)    Late effect of cerebrovascular accident (CVA) 10/19/2019   Melanoma in situ of right upper extremity including shoulder (HCC) 08/07/2016   Formatting of this note might be different from the original. August 2017.  Dr. Chancy Milroy, [facility].   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   Short of breath on exertion    per patient   Stroke [facility])    November   Symptomatic severe aortic stenosis with normal ejection fraction 08/15/2020    Social History:  Social History   Socioeconomic History   Marital status: Married    Spouse name: Not on file   Number of children: Not on file   Years of education: Not on file   Highest education level: Not on file  Occupational History   Not on file  Tobacco Use   Smoking status: Former   Smokeless tobacco: Never  Vaping Use   Vaping status: Never Used  Substance and Sexual Activity   Alcohol use: Not Currently   Drug use: Never   Sexual activity: Not on file  Other Topics Concern   Not on file  Social History Narrative   Not on file   Social Determinants of Health   Financial Resource Strain: Not on file  Food Insecurity: No Food Insecurity (06/23/2023)   Hunger  Vital Sign    Worried About Programme researcher, broadcasting/film/video in the Last Year: Never true    Ran Out of Food in the Last Year: Never true  Transportation Needs: No Transportation Needs (06/23/2023)   PRAPARE - Administrator, Civil Service (Medical): No    Lack of Transportation (Non-Medical): No  Physical Activity: Not on file  Stress: Not on file  Social Connections: Not on file  Intimate Partner Violence: Not At Risk (06/23/2023)   Humiliation, Afraid, Rape, and Kick questionnaire    Fear of Current or Ex-Partner: No    Emotionally Abused: No    Physically Abused: No    Sexually Abused: No    Medications:   Current Outpatient Medications on File Prior to Visit  Medication Sig Dispense Refill   acetaminophen (TYLENOL) 650 MG CR tablet Take 325 mg by mouth every 8 (eight) hours as needed for pain.     albuterol (VENTOLIN HFA) 108 (90 Base) MCG/ACT inhaler Inhale 1 puff into the lungs every 6 (six) hours as needed for shortness of breath or wheezing.     aspirin EC 81 MG tablet  Take 1 tablet (81 mg total) by mouth daily. Swallow whole.     atorvastatin (LIPITOR) 80 MG tablet Take 1 tablet by mouth every evening.     calcium carbonate (TUMS - DOSED IN MG ELEMENTAL CALCIUM) 500 MG chewable tablet Chew 1 tablet (200 mg of elemental calcium total) by mouth 3 (three) times daily with meals.     Cyanocobalamin (B-12 PO) Take 1 tablet by mouth every evening.     diclofenac Sodium (VOLTAREN) 1 % GEL Apply 4 g topically 4 (four) times daily.     dicyclomine (BENTYL) 20 MG tablet Take 20 mg by mouth every 6 (six) hours.     finasteride (PROSCAR) 5 MG tablet Take 5 mg by mouth every evening.     FLUoxetine (PROZAC) 20 MG capsule Take 1 capsule (20 mg total) by mouth daily.     memantine (NAMENDA) 10 MG tablet Take 1 tablet (10 mg total) by mouth 2 (two) times daily. 60 tablet 3   polyethylene glycol (MIRALAX / GLYCOLAX) 17 g packet Take 17 g by mouth 2 (two) times daily.     senna-docusate (SENOKOT-S) 8.6-50 MG tablet Take 1 tablet by mouth 2 (two) times daily.     traZODone (DESYREL) 50 MG tablet Take 1 tablet (50 mg total) by mouth at bedtime.     No current facility-administered medications on file prior to visit.    Allergies:  No Known Allergies  Physical Exam General: Frail malnourished looking elderly Caucasian male, seated, in no evident distress Head: head normocephalic and atraumatic.  Neck: supple with no carotid or supraclavicular bruits Cardiovascular: regular rate and rhythm, no murmurs Musculoskeletal: no deformity Skin:  no rash/petichiae Vascular:  Normal pulses all extremities Vitals:   09/09/23 1307  BP: (!) 114/57  Pulse: 81   Neurologic Exam Mental Status: Awake and fully alert. Oriented to person only. Recent and remote memory poor attention span, concentration and fund of knowledge diminished. Mood and affect appropriate.  Follows only simple one-step commands.  Poor insight into his condition.  MMSE not done but last visit was 10 Cranial  Nerves: Fundoscopic exam reveals sharp disc margins. Pupils equal, briskly reactive to light. Extraocular movements full without nystagmus. Visual fields full to confrontation. Hearing diminished bilaterally.  Facial sensation intact. Face, tongue, palate moves normally and symmetrically.  Motor: Normal bulk  and tone. Normal strength in all tested extremity muscles. Sensory.: intact to touch ,pinprick .position and vibratory sensation.  Coordination: Rapid alternating movements normal in all extremities. Finger-to-nose and heel-to-shin performed accurately bilaterally. Gait and Station: Deferred as patient is in a wheelchair and unable to walk at baseline even with assistance.  Reflexes: 1+ and symmetric. Toes downgoing.   NIHSS  2 Modified Rankin  4     03/23/2023   10:17 AM  MMSE - Mini Mental State Exam  Orientation to time 1  Orientation to Place 3  Registration 0  Attention/ Calculation 0  Recall 0  Language- name 2 objects 2  Language- repeat 0  Language- follow 3 step command 3  Language- read & follow direction 1  Write a sentence 0  Copy design 0  Total score 10    ASSESSMENT:  89 year old Caucasian male with recent left thalamic lacunar stroke in January 2024 and left internal capsule lacunar stroke in July 2024 from small vessel disease with significant underlying mixed vascular and Alzheimer's dementia.     PLAN:I had a long d/w patient and his caregiver from nursing facility about his recent lacunar strokes dementia, risk for recurrent stroke/TIAs, personally independently reviewed imaging studies and stroke evaluation results and answered questions.Continue aspirin 81 mg daily  for secondary stroke prevention and maintain strict control of hypertension with blood pressure goal below 130/90, diabetes with hemoglobin A1c goal below 6.5% and lipids with LDL cholesterol goal below 70 mg/dL. I also advised the patient to eat a healthy diet with plenty of whole grains, cereals,  fruits and vegetables, exercise regularly and maintain ideal body weight .continue ongoing physical and Occupational Therapy and 24-hour nursing care.  Followup in the future with me i only as needed and no scheduled appointment was made. Greater than 50% of time during this 35 minute visit was spent on counseling,explanation of diagnosis of lacunar strokes and dementia, planning of further management, discussion with patient and family and coordination of care Delia Heady, MD Note: This document was prepared with digital dictation and possible smart phrase technology. Any transcriptional errors that result from this process are unintentional

## 2023-09-09 NOTE — Patient Instructions (Signed)
I had a long d/w patient and his caregiver from nursing facility about his recent lacunar strokes dementia, risk for recurrent stroke/TIAs, personally independently reviewed imaging studies and stroke evaluation results and answered questions.Continue aspirin 81 mg daily  for secondary stroke prevention and maintain strict control of hypertension with blood pressure goal below 130/90, diabetes with hemoglobin A1c goal below 6.5% and lipids with LDL cholesterol goal below 70 mg/dL. I also advised the patient to eat a healthy diet with plenty of whole grains, cereals, fruits and vegetables, exercise regularly and maintain ideal body weight .continue ongoing physical and Occupational Therapy and 24-hour nursing care.  Followup in the future with me i only as needed and no scheduled appointment was made.

## 2023-09-27 ENCOUNTER — Ambulatory Visit: Payer: Medicare HMO

## 2023-09-27 DIAGNOSIS — I63412 Cerebral infarction due to embolism of left middle cerebral artery: Secondary | ICD-10-CM | POA: Diagnosis not present

## 2023-09-27 LAB — CUP PACEART REMOTE DEVICE CHECK
Date Time Interrogation Session: 20241020230310
Implantable Pulse Generator Implant Date: 20200921

## 2023-10-13 NOTE — Progress Notes (Signed)
Carelink Summary Report / Loop Recorder 

## 2023-11-01 ENCOUNTER — Ambulatory Visit (INDEPENDENT_AMBULATORY_CARE_PROVIDER_SITE_OTHER): Payer: Medicare HMO

## 2023-11-01 DIAGNOSIS — I63412 Cerebral infarction due to embolism of left middle cerebral artery: Secondary | ICD-10-CM | POA: Diagnosis not present

## 2023-11-01 LAB — CUP PACEART REMOTE DEVICE CHECK
Date Time Interrogation Session: 20241122230533
Implantable Pulse Generator Implant Date: 20200921

## 2023-11-26 NOTE — Progress Notes (Signed)
Carelink Summary Report / Loop Recorder 

## 2023-12-06 ENCOUNTER — Encounter: Payer: Medicare HMO | Admitting: Physical Medicine and Rehabilitation

## 2023-12-06 ENCOUNTER — Ambulatory Visit: Payer: Medicare HMO

## 2023-12-06 DIAGNOSIS — I63412 Cerebral infarction due to embolism of left middle cerebral artery: Secondary | ICD-10-CM | POA: Diagnosis not present

## 2023-12-07 LAB — CUP PACEART REMOTE DEVICE CHECK
Date Time Interrogation Session: 20241229230409
Implantable Pulse Generator Implant Date: 20200921

## 2024-01-10 ENCOUNTER — Ambulatory Visit (INDEPENDENT_AMBULATORY_CARE_PROVIDER_SITE_OTHER): Payer: Medicare HMO

## 2024-01-10 DIAGNOSIS — I63412 Cerebral infarction due to embolism of left middle cerebral artery: Secondary | ICD-10-CM | POA: Diagnosis not present

## 2024-01-10 LAB — CUP PACEART REMOTE DEVICE CHECK
Date Time Interrogation Session: 20250202230129
Implantable Pulse Generator Implant Date: 20200921

## 2024-01-24 ENCOUNTER — Other Ambulatory Visit (HOSPITAL_COMMUNITY): Payer: Self-pay | Admitting: Surgery

## 2024-01-24 DIAGNOSIS — K810 Acute cholecystitis: Secondary | ICD-10-CM

## 2024-02-07 ENCOUNTER — Ambulatory Visit (HOSPITAL_COMMUNITY)
Admission: RE | Admit: 2024-02-07 | Discharge: 2024-02-07 | Disposition: A | Discharge status: Home / Self Care | Payer: Medicare HMO | Source: Ambulatory Visit | Attending: Surgery | Admitting: Surgery

## 2024-02-07 ENCOUNTER — Telehealth: Payer: Self-pay

## 2024-02-07 DIAGNOSIS — K810 Acute cholecystitis: Secondary | ICD-10-CM | POA: Diagnosis present

## 2024-02-07 DIAGNOSIS — K8000 Calculus of gallbladder with acute cholecystitis without obstruction: Secondary | ICD-10-CM | POA: Diagnosis not present

## 2024-02-07 HISTORY — PX: IR CHOLANGIOGRAM EXISTING TUBE: IMG6040

## 2024-02-07 MED ORDER — IOHEXOL 300 MG/ML  SOLN
50.0000 mL | Freq: Once | INTRAMUSCULAR | Status: AC | PRN
  Administered 2024-02-07: 10 mL

## 2024-02-07 NOTE — Telephone Encounter (Signed)
 Alert received from [employer] for new AF in progress from 3/3 @ 02:23, ASA only per EPIC.  Attempted to contact patient/wife (same number on file). No answer, LMTCB.  Need to send referral to AF clinic to discuss OAC.

## 2024-02-08 NOTE — Telephone Encounter (Signed)
 Message sent to front desk to move appt up to March 14th per Wallis Bamberg, NP.

## 2024-02-08 NOTE — Telephone Encounter (Signed)
 Additional alerts received for additional AF noted per loop recorder.  Will continue to try to contact Pt for follow up.

## 2024-02-08 NOTE — Telephone Encounter (Signed)
 Spoke with Pt's wife.  Pt is currently residing in a nursing home in [place].  Per wife-contact nursing home to set up appointment with afib clinic.  Advised would contact nursing home and return her call.  Nursing home:[phone].  Spoke with nursing home representative.  Transportation for facility is currently out in the field.  She will have them call this nurse upon their return to set up office visit with Afib clinic.  Direct # to this nurse given.

## 2024-02-08 NOTE — Telephone Encounter (Signed)
 Call back received from transportation specialist Rayfield Citizen for [facility].  She was questioning need for Pt appointment because Pt is scheduled to see Dr. Bing Matter February 21, 2024 in [place].  Advised would check with Dr. Bing Matter to see if he is ok with waiting to start [facility] prior to his upcoming appointment, or if ok to wait or if he wants Pt seen sooner.  Will follow up with Rayfield Citizen at [phone].  This is her cell number.  She states she cannot answer if she is driving.

## 2024-02-14 ENCOUNTER — Ambulatory Visit: Payer: Medicare HMO

## 2024-02-14 DIAGNOSIS — I63412 Cerebral infarction due to embolism of left middle cerebral artery: Secondary | ICD-10-CM

## 2024-02-15 LAB — CUP PACEART REMOTE DEVICE CHECK
Date Time Interrogation Session: 20250309230159
Implantable Pulse Generator Implant Date: 20200921

## 2024-02-15 NOTE — Progress Notes (Signed)
 [facility] Summary Report / Loop Recorder

## 2024-02-18 ENCOUNTER — Ambulatory Visit: Attending: Cardiology | Admitting: Cardiology

## 2024-02-18 ENCOUNTER — Encounter: Payer: Self-pay | Admitting: Cardiology

## 2024-02-18 ENCOUNTER — Other Ambulatory Visit: Payer: Self-pay | Admitting: Surgery

## 2024-02-18 VITALS — BP 136/78 | HR 90 | Ht 60.0 in | Wt 168.5 lb

## 2024-02-18 DIAGNOSIS — Z955 Presence of coronary angioplasty implant and graft: Secondary | ICD-10-CM

## 2024-02-18 DIAGNOSIS — I480 Paroxysmal atrial fibrillation: Secondary | ICD-10-CM | POA: Insufficient documentation

## 2024-02-18 DIAGNOSIS — Z952 Presence of prosthetic heart valve: Secondary | ICD-10-CM | POA: Diagnosis not present

## 2024-02-18 DIAGNOSIS — C221 Intrahepatic bile duct carcinoma: Secondary | ICD-10-CM

## 2024-02-18 DIAGNOSIS — I6930 Unspecified sequelae of cerebral infarction: Secondary | ICD-10-CM

## 2024-02-18 DIAGNOSIS — I10 Essential (primary) hypertension: Secondary | ICD-10-CM | POA: Diagnosis not present

## 2024-02-18 DIAGNOSIS — I2510 Atherosclerotic heart disease of native coronary artery without angina pectoris: Secondary | ICD-10-CM | POA: Diagnosis not present

## 2024-02-18 NOTE — Progress Notes (Signed)
 Cardiology Office Note:    Date:  02/18/2024   ID:  Michael Kidd, DOB November 13, 1930, MRN [id number]  PCP:  Barron Alvine, MD  Cardiologist:  Gypsy Balsam, MD    Referring MD: Barron Alvine, MD   Chief Complaint  Patient presents with   [facility] follow up    History of Present Illness:    Michael Kidd is a 89 y.o. male past medical history significant for severe aortic stenosis status post TAVI done in 09/24/2020 with 26 mm SAPIEN 3 Edwards valve, left bundle branch block, implantable loop recorder, history of remote CVA recent CVA, history of nonsustained ventricular tachycardia essential hypertension, coronary artery disease.  He comes today after being in the hospital initial reason for hospitalization was cholecystitis required drainage after that he ended up being with pneumonia.  Also implantable loop recorder recorded episodes of atrial fibrillation lasting up to 6 minutes.  He comes here today to talk about this.  He is dementia rapidly progressed.  He barely talks.  He barely does anything.  He sleeps majority of time he comes with his wife as well as here with his son to our office.  I cannot get any history from him  Past Medical History:  Diagnosis Date   Allergic rhinitis 03/29/2014   Aortic valve stenosis 05/23/2020   Arthralgia 07/15/2016   Arthritis    Benign prostatic hyperplasia 02/14/2014   Benign prostatic hyperplasia with urinary frequency 11/02/2018   Bilateral chronic knee pain    BPH (benign prostatic hyperplasia)    CAD (coronary artery disease)    s/p remote LAD PCI   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 PHQ-9 completed 02/26/14 PHQ-2 completed 02/14/14   Dizziness 03/12/2014   Dupuytren's contracture 07/15/2016   Dyspnea on exertion 08/15/2020   Dysthymia     Essential hypertension 03/12/2014   Flexor tendon bowstring 01/06/2016   Gastroesophageal reflux disease 08/18/2016   Hepatic steatosis 03/02/2014   History of CVA (cerebrovascular accident) 10/12/2019   HLD (hyperlipidemia) 02/14/2014   HTN (hypertension)    Late effect of cerebrovascular accident (CVA) 10/19/2019   Melanoma in situ of right upper extremity including shoulder (HCC) 08/07/2016   Formatting of this note might be different from the original. August 2017.  Dr. Chancy Milroy, [facility].   Murmur 05/23/2020   Myocardial infarction [facility])    Neuropathic pain, leg, bilateral 11/23/2017   Nonsustained ventricular tachycardia (HCC) 11/12/2020   Pure hypercholesterolemia 09/14/2018   Quadriceps weakness 09/01/2017   Renal cyst    Risk for falls 10/01/2016   S/P TAVR (transcatheter aortic valve replacement) 09/24/2020   Short of breath on exertion    per patient   Stroke [facility])    November   Symptomatic severe aortic stenosis with normal ejection fraction 08/15/2020    Past Surgical History:  Procedure Laterality Date   CATARACT EXTRACTION W/ INTRAOCULAR LENS  IMPLANT, BILATERAL     cataract repair     HERNIA REPAIR     unsure of date   IR CHOLANGIOGRAM EXISTING TUBE  02/07/2024   LOOP RECORDER INSERTION N/A 08/28/2019   Procedure: LOOP RECORDER INSERTION;  Surgeon: Marinus Maw, MD;  Location: [facility] INVASIVE CV LAB;  Service: Cardiovascular;  Laterality: N/A;   PERCUTANEOUS CORONARY STENT INTERVENTION (PCI-S)  RIGHT/LEFT HEART CATH AND CORONARY ANGIOGRAPHY N/A 08/22/2020   Procedure: RIGHT/LEFT HEART CATH AND CORONARY ANGIOGRAPHY;  Surgeon: Marykay Lex, MD;  Location: [facility] INVASIVE CV LAB;  Service: Cardiovascular;  Laterality: N/A;   TEE WITHOUT CARDIOVERSION N/A 09/24/2020   Procedure: TRANSESOPHAGEAL ECHOCARDIOGRAM (TEE);  Surgeon: Tonny Bollman, MD;  Location: [facility] OR;  Service: Open Heart Surgery;  Laterality: N/A;   TRANSCATHETER AORTIC VALVE REPLACEMENT, TRANSFEMORAL  N/A 09/24/2020   Procedure: TRANSCATHETER AORTIC VALVE REPLACEMENT, TRANSFEMORAL - USING EDWARDS SAPIEN 3 ULTRA  VALVE SIZE ;  Surgeon: Tonny Bollman, MD;  Location: [facility] OR;  Service: Open Heart Surgery;  Laterality: N/A;    Current Medications: Current Meds  Medication Sig   ascorbic acid (VITAMIN C) 500 MG tablet Take 500 mg by mouth daily.   aspirin EC 81 MG tablet Take 1 tablet (81 mg total) by mouth daily. Swallow whole.   atorvastatin (LIPITOR) 40 MG tablet Take 1 tablet by mouth every evening.   benzonatate (TESSALON) 100 MG capsule Take 100 mg by mouth 3 (three) times daily as needed for cough.   calcium carbonate (TUMS - DOSED IN MG ELEMENTAL CALCIUM) 500 MG chewable tablet Chew 1 tablet (200 mg of elemental calcium total) by mouth 3 (three) times daily with meals.   dicyclomine (BENTYL) 20 MG tablet Take 20 mg by mouth every 6 (six) hours.   divalproex (DEPAKOTE) 125 MG DR tablet Take 125 mg by mouth 3 (three) times daily.   ferrous sulfate 325 (65 FE) MG EC tablet Take 325 mg by mouth 3 (three) times daily with meals.   finasteride (PROSCAR) 5 MG tablet Take 5 mg by mouth every evening.   guaifenesin (ROBITUSSIN) 100 MG/5ML syrup Take 10 mLs by mouth 4 (four) times daily as needed for cough.   ipratropium-albuterol (DUONEB) 0.5-2.5 (3) MG/3ML SOLN Take 3 mLs by nebulization every 6 (six) hours as needed (SOB/ wheezing).   LORazepam (ATIVAN) 0.5 MG tablet Take 0.5 mg by mouth every 6 (six) hours as needed for anxiety.   memantine (NAMENDA) 10 MG tablet Take 1 tablet (10 mg total) by mouth 2 (two) times daily.   Menthol-Methyl Salicylate (ANALGESIC BALM EX) Apply 10-15 g topically daily.   mirtazapine (REMERON) 15 MG tablet Take 15 mg by mouth daily at 6 (six) AM.   ondansetron (ZOFRAN) 4 MG tablet Take 4 mg by mouth every 6 (six) hours as needed for nausea or vomiting.   pantoprazole (PROTONIX) 40 MG tablet Take 40 mg by mouth daily.   polyethylene glycol (MIRALAX / GLYCOLAX)  17 g packet Take 17 g by mouth 2 (two) times daily.   Probiotic Product (PROBIOTIC DAILY PO) Take 1 tablet by mouth daily.   QUEtiapine (SEROQUEL) 25 MG tablet Take 25 mg by mouth 3 (three) times daily.   senna-docusate (SENOKOT-S) 8.6-50 MG tablet Take 1 tablet by mouth 2 (two) times daily.   traMADol (ULTRAM) 50 MG tablet Take 50 mg by mouth 2 (two) times daily.   traZODone (DESYREL) 50 MG tablet Take 1 tablet (50 mg total) by mouth at bedtime.   [DISCONTINUED] acetaminophen (TYLENOL) 650 MG CR tablet Take 325 mg by mouth every 8 (eight) hours as needed for pain.   [DISCONTINUED] albuterol (VENTOLIN HFA) 108 (90 Base) MCG/ACT inhaler Inhale 1 puff into the lungs every 6 (six) hours as needed for shortness of breath or wheezing.   [DISCONTINUED] Cyanocobalamin (B-12 PO) Take 1 tablet by mouth every evening.   [DISCONTINUED] diclofenac Sodium (VOLTAREN) 1 %  GEL Apply 4 g topically 4 (four) times daily.   [DISCONTINUED] FLUoxetine (PROZAC) 20 MG capsule Take 1 capsule (20 mg total) by mouth daily.     Allergies:   Patient has no known allergies.   Social History   Socioeconomic History   Marital status: Married    Spouse name: Not on file   Number of children: Not on file   Years of education: Not on file   Highest education level: Not on file  Occupational History   Not on file  Tobacco Use   Smoking status: Former   Smokeless tobacco: Never  Vaping Use   Vaping status: Never Used  Substance and Sexual Activity   Alcohol use: Not Currently   Drug use: Never   Sexual activity: Not on file  Other Topics Concern   Not on file  Social History Narrative   Not on file   Social Drivers of Health   Financial Resource Strain: Not on file  Food Insecurity: No Food Insecurity (06/23/2023)   Hunger Vital Sign    Worried About Running Out of Food in the Last Year: Never true    Ran Out of Food in the Last Year: Never true  Transportation Needs: No Transportation Needs (06/23/2023)    PRAPARE - Administrator, Civil Service (Medical): No    Lack of Transportation (Non-Medical): No  Physical Activity: Not on file  Stress: Not on file  Social Connections: Not on file     Family History: The patient's family history includes Diabetes in his mother; Stroke in his father. ROS:   Please see the history of present illness.    All 14 point review of systems negative except as described per history of present illness  EKGs/Labs/Other Studies Reviewed:    EKG Interpretation Date/Time:  Friday February 18 2024 10:27:12 EDT Ventricular Rate:  90 PR Interval:  174 QRS Duration:  98 QT Interval:  368 QTC Calculation: 450 R Axis:   -47  Text Interpretation: Normal sinus rhythm Left axis deviation Left ventricular hypertrophy with repolarization abnormality Abnormal ECG When compared with ECG of 25-Sep-2020 03:58, Questionable change in QRS duration Confirmed by Gypsy Balsam [phone]) on 02/18/2024 10:28:21 AM    Recent Labs: 07/02/2023: ALT 40 09/06/2023: BUN 16; Creatinine, Ser 1.74; Hemoglobin 14.9; Magnesium 2.4; Platelets 256; Potassium 4.7; Sodium 141; TSH 4.460  Recent Lipid Panel    Component Value Date/Time   CHOL 100 06/24/2023 0126   TRIG 94 06/24/2023 0126   HDL 31 (L) 06/24/2023 0126   CHOLHDL 3.2 06/24/2023 0126   VLDL 19 06/24/2023 0126   LDLCALC 50 06/24/2023 0126    Physical Exam:    VS:  BP 136/78 (BP Location: Right Arm, Patient Position: Sitting)   Pulse 90   Ht 5' (1.524 m)   Wt 168 lb 8 oz (76.4 kg)   HC 6" (15.2 cm)   SpO2 95%   BMI 32.91 kg/m     Wt Readings from Last 3 Encounters:  02/18/24 168 lb 8 oz (76.4 kg)  09/09/23 180 lb (81.6 kg)  09/06/23 181 lb (82.1 kg)     GEN:  Well nourished, well developed in no acute distress HEENT: Normal NECK: No JVD; No carotid bruits LYMPHATICS: No lymphadenopathy CARDIAC: RRR, no murmurs, no rubs, no gallops RESPIRATORY:  Clear to auscultation without rales, wheezing or rhonchi   ABDOMEN: Soft, non-tender, non-distended MUSCULOSKELETAL:  No edema; No deformity  SKIN: Warm and dry LOWER EXTREMITIES: no swelling  NEUROLOGIC:  Alert and oriented x 3 PSYCHIATRIC:  Normal affect   ASSESSMENT:    1. Essential hypertension   2. Coronary artery disease involving native coronary artery of native heart without angina pectoris   3. S/P TAVR (transcatheter aortic valve replacement)   4. Presence of coronary angioplasty implant and graft   5. Late effect of cerebrovascular accident (CVA)   6. Paroxysmal atrial fibrillation (HCC)    PLAN:    In order of problems listed above:  Paroxysmal atrial fibrillation send new discovery.  Recent history of CVA.  However because of comorbidity as well as drain after cholecystectomy I am reluctant to immediately initiate anticoagulation I will check his CBC today make sure he is not anemic see him back in a month to see if his condition improved any.  I simply think the risk of anticoagulation this 89 years old gentleman with multiple problems high.  Even though at the same time risk of recurrent CVA is elevated as well. Coronary artery disease difficult to judge unable to get any history from him. Status post TAVI.  Stable. History of CVA.  Noted.   Medication Adjustments/Labs and Tests Ordered: Current medicines are reviewed at length with the patient today.  Concerns regarding medicines are outlined above.  Orders Placed This Encounter  Procedures   EKG 12-Lead   Medication changes: No orders of the defined types were placed in this encounter.   Signed, Georgeanna Lea, MD, [facility] 02/18/2024 10:50 AM    [facility]

## 2024-02-18 NOTE — Patient Instructions (Signed)
 Medication Instructions:  Your physician recommends that you continue on your current medications as directed. Please refer to the Current Medication list given to you today.  *If you need a refill on your cardiac medications before your next appointment, please call your pharmacy*   Lab Work: CBC, Stool for blood- today If you have labs (blood work) drawn today and your tests are completely normal, you will receive your results only by: MyChart Message (if you have MyChart) OR A paper copy in the mail If you have any lab test that is abnormal or we need to change your treatment, we will call you to review the results.   Testing/Procedures: None Ordered   Follow-Up: At [facility], you and your health needs are our priority.  As part of our continuing mission to provide you with exceptional heart care, we have created designated Provider Care Teams.  These Care Teams include your primary Cardiologist (physician) and Advanced Practice Providers (APPs -  Physician Assistants and Nurse Practitioners) who all work together to provide you with the care you need, when you need it.  We recommend signing up for the patient portal called "MyChart".  Sign up information is provided on this After Visit Summary.  MyChart is used to connect with patients for Virtual Visits (Telemedicine).  Patients are able to view lab/test results, encounter notes, upcoming appointments, etc.  Non-urgent messages can be sent to your provider as well.   To learn more about what you can do with MyChart, go to [email or web address].    Your next appointment:   1 month(s)  The format for your next appointment:   In Person  Provider:   Gypsy Balsam, MD    Other Instructions NA

## 2024-02-18 NOTE — Addendum Note (Signed)
 Addended by: Baldo Ash D on: 02/18/2024 10:58 AM   Modules accepted: Orders

## 2024-02-19 LAB — CBC
Hematocrit: 40.4 % (ref 37.5–51.0)
Hemoglobin: 13.2 g/dL (ref 13.0–17.7)
MCH: 30 pg (ref 26.6–33.0)
MCHC: 32.7 g/dL (ref 31.5–35.7)
MCV: 92 fL (ref 79–97)
Platelets: 205 10*3/uL (ref 150–450)
RBC: 4.4 x10E6/uL (ref 4.14–5.80)
RDW: 15.3 % (ref 11.6–15.4)
WBC: 12.7 10*3/uL — ABNORMAL HIGH (ref 3.4–10.8)

## 2024-02-21 ENCOUNTER — Ambulatory Visit: Payer: Medicare HMO | Admitting: Cardiology

## 2024-02-24 ENCOUNTER — Telehealth: Payer: Self-pay

## 2024-02-24 NOTE — Telephone Encounter (Signed)
 [phone] Result informed to Hailey RN at [facility] where the patient resides now. Faxed results with Dr.K -message to [phone].

## 2024-02-24 NOTE — Telephone Encounter (Signed)
-----   Message from Gypsy Balsam sent at 02/21/2024  4:47 PM EDT ----- CBC elevated lower but still within normal limits

## 2024-02-27 LAB — FECAL OCCULT BLOOD, IMMUNOCHEMICAL: Fecal Occult Bld: NEGATIVE

## 2024-03-06 ENCOUNTER — Other Ambulatory Visit

## 2024-03-06 ENCOUNTER — Ambulatory Visit
Admission: RE | Admit: 2024-03-06 | Discharge: 2024-03-06 | Disposition: A | Discharge status: Home / Self Care | Source: Ambulatory Visit | Attending: Surgery | Admitting: Surgery

## 2024-03-06 ENCOUNTER — Telehealth (HOSPITAL_COMMUNITY): Payer: Self-pay | Admitting: Interventional Radiology

## 2024-03-06 ENCOUNTER — Other Ambulatory Visit: Payer: Self-pay | Admitting: Surgery

## 2024-03-06 DIAGNOSIS — C221 Intrahepatic bile duct carcinoma: Secondary | ICD-10-CM

## 2024-03-06 DIAGNOSIS — K819 Cholecystitis, unspecified: Secondary | ICD-10-CM

## 2024-03-06 HISTORY — PX: IR RADIOLOGIST EVAL & MGMT: IMG5224

## 2024-03-06 MED ORDER — IOPAMIDOL (ISOVUE-M 300) INJECTION 61%
15.0000 mL | Freq: Once | INTRAMUSCULAR | Status: DC | PRN
Start: 2024-03-06 — End: 2024-03-07

## 2024-03-06 MED ORDER — IOPAMIDOL (ISOVUE-300) INJECTION 61%
30.0000 mL | Freq: Once | INTRAVENOUS | Status: AC | PRN
  Administered 2024-03-06: 10 mL

## 2024-03-06 MED ORDER — LIDOCAINE HCL 1 % IJ SOLN
10.0000 mL | Freq: Once | INTRAMUSCULAR | Status: AC
  Administered 2024-03-06: 10 mL via INTRADERMAL

## 2024-03-06 NOTE — Progress Notes (Signed)
 Chief Complaint: Perc chole  Referring Physician(s): Moorhead,Andrew S.  History of Present Illness: Michael Kidd is a 89 y.o. male presenting as outpatient from [facility] for possible perc chole drain exchange.   Michael Kidd apparently had a perc chole performed at [facility] in [facility] 01/10/24 with Ct guidance.  I do not have medical records from the admission.   Michael Kidd is oriented to self, not to place or to time.  He does not know who the president is currently, and does not know the year.  I spoke with his wife, Michael Kidd via phone.  She is poor historian with his admission.  She provided phone consent for the perc chole exchange.     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 PHQ-9 completed 02/26/14 PHQ-2 completed 02/14/14   Dizziness 03/12/2014   Dupuytren's contracture 07/15/2016   Dyspnea on exertion 08/15/2020   Dysthymia    Essential hypertension 03/12/2014   Flexor tendon bowstring 01/06/2016   Gastroesophageal reflux disease 08/18/2016   Hepatic steatosis 03/02/2014   History of CVA (cerebrovascular accident) 10/12/2019   HLD (hyperlipidemia) 02/14/2014   HTN (hypertension)    Late effect of cerebrovascular accident (CVA) 10/19/2019   Melanoma in situ of right upper extremity including shoulder (HCC) 08/07/2016   Formatting of this note might be different from the original. August 2017.  Dr. Chancy Milroy,  [facility].   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   Short of breath on exertion    per patient   Stroke [facility])    November   Symptomatic severe aortic stenosis with normal ejection fraction 08/15/2020    Past Surgical History:  Procedure Laterality Date   CATARACT EXTRACTION W/ INTRAOCULAR LENS  IMPLANT, BILATERAL     cataract repair     HERNIA REPAIR     unsure of date   IR CHOLANGIOGRAM EXISTING TUBE  02/07/2024   LOOP RECORDER INSERTION N/A 08/28/2019   Procedure: LOOP RECORDER INSERTION;  Surgeon: Marinus Maw, MD;  Location: [facility] INVASIVE CV LAB;  Service: Cardiovascular;  Laterality: N/A;   PERCUTANEOUS CORONARY STENT INTERVENTION (PCI-S)     RIGHT/LEFT HEART CATH AND CORONARY ANGIOGRAPHY N/A 08/22/2020   Procedure: RIGHT/LEFT HEART CATH AND CORONARY ANGIOGRAPHY;  Surgeon: Marykay Lex, MD;  Location: [facility] INVASIVE CV LAB;  Service: Cardiovascular;  Laterality: N/A;   TEE WITHOUT CARDIOVERSION N/A 09/24/2020   Procedure: TRANSESOPHAGEAL ECHOCARDIOGRAM (TEE);  Surgeon: Tonny Bollman, MD;  Location: [facility] OR;  Service: Open Heart Surgery;  Laterality: N/A;   TRANSCATHETER AORTIC VALVE REPLACEMENT, TRANSFEMORAL N/A 09/24/2020   Procedure: TRANSCATHETER AORTIC VALVE  REPLACEMENT, TRANSFEMORAL - USING EDWARDS SAPIEN 3 ULTRA  VALVE SIZE ;  Surgeon: Tonny Bollman, MD;  Location: [facility] OR;  Service: Open Heart Surgery;  Laterality: N/A;    Allergies: Patient has no known allergies.  Medications: Prior to Admission medications   Medication Sig Start Date End Date Taking? Authorizing Provider  ascorbic acid (VITAMIN C) 500 MG tablet Take 500 mg by mouth daily.    [provider]   aspirin EC 81 MG tablet Take 1 tablet (81 mg total) by mouth daily. Swallow whole. 07/02/23   Dorcas Carrow, MD  atorvastatin (LIPITOR) 40 MG tablet Take 1 tablet by mouth every evening. 06/19/23   [provider]  benzonatate (TESSALON) 100 MG capsule Take 100 mg by mouth 3 (three) times daily as needed for cough.    [provider]  calcium carbonate (TUMS - DOSED IN MG ELEMENTAL CALCIUM) 500 MG chewable tablet Chew 1 tablet (200 mg of elemental calcium total) by mouth 3 (three) times daily with meals. 07/01/23   Dorcas Carrow, MD  dicyclomine (BENTYL) 20 MG tablet Take 20 mg by mouth every 6 (six) hours.    [provider]  divalproex (DEPAKOTE) 125 MG DR tablet Take 125 mg by mouth 3 (three) times daily.    [provider]  ferrous sulfate 325 (65 FE) MG EC tablet Take 325 mg by mouth 3 (three) times daily with meals.    [provider]  finasteride (PROSCAR) 5 MG tablet Take 5 mg by mouth every evening.    [provider]  guaifenesin (ROBITUSSIN) 100 MG/5ML syrup Take 10 mLs by mouth 4 (four) times daily as needed for cough.    [provider]  ipratropium-albuterol (DUONEB) 0.5-2.5 (3) MG/3ML SOLN Take 3 mLs by nebulization every 6 (six) hours as needed (SOB/ wheezing).    [provider]  LORazepam (ATIVAN) 0.5 MG tablet Take 0.5 mg by mouth every 6 (six) hours as needed for anxiety.    [provider]  memantine (NAMENDA) 10 MG tablet Take 1 tablet (10 mg total) by mouth 2 (two) times daily. 03/23/23   Micki Riley, MD  Menthol-Methyl Salicylate (ANALGESIC BALM EX) Apply 10-15 g topically daily.    [provider]  mirtazapine (REMERON) 15 MG tablet Take 15 mg by mouth daily at 6 (six) AM.    [provider]  ondansetron (ZOFRAN) 4 MG tablet Take 4 mg by mouth every 6 (six) hours as needed for nausea or vomiting.    [provider]  pantoprazole (PROTONIX) 40 MG tablet Take 40 mg by  mouth daily.    [provider]  polyethylene glycol (MIRALAX / GLYCOLAX) 17 g packet Take 17 g by mouth 2 (two) times daily. 07/13/23   Love, Evlyn Kanner, PA-C  Probiotic Product (PROBIOTIC DAILY PO) Take 1 tablet by mouth daily.    [provider]  QUEtiapine (SEROQUEL) 25 MG tablet Take 25 mg by mouth 3 (three) times daily.    [provider]  senna-docusate (SENOKOT-S) 8.6-50 MG tablet Take 1 tablet by mouth 2 (two) times daily. 07/13/23   Love, Evlyn Kanner, PA-C  traMADol (ULTRAM) 50 MG tablet Take 50 mg by mouth 2 (two) times daily.    [provider]  traZODone (DESYREL) 50 MG tablet Take 1 tablet (50 mg total) by mouth at bedtime. 07/19/23   Angiulli, Mcarthur Rossetti, PA-C     Family History  Problem Relation Age of Onset   Diabetes Mother  Stroke Father     Social History   Socioeconomic History   Marital status: Married    Spouse name: Not on file   Number of children: Not on file   Years of education: Not on file   Highest education level: Not on file  Occupational History   Not on file  Tobacco Use   Smoking status: Former   Smokeless tobacco: Never  Vaping Use   Vaping status: Never Used  Substance and Sexual Activity   Alcohol use: Not Currently   Drug use: Never   Sexual activity: Not on file  Other Topics Concern   Not on file  Social History Narrative   Not on file   Social Drivers of Health   Financial Resource Strain: Not on file  Food Insecurity: No Food Insecurity (06/23/2023)   Hunger Vital Sign    Worried About Running Out of Food in the Last Year: Never true    Ran Out of Food in the Last Year: Never true  Transportation Needs: No Transportation Needs (06/23/2023)   PRAPARE - Administrator, Civil Service (Medical): No    Lack of Transportation (Non-Medical): No  Physical Activity: Not on file  Stress: Not on file  Social Connections: Not on file      Review of Systems: A 12 point ROS discussed and pertinent  positives are indicated in the HPI above.  All other systems are negative.  Review of Systems  Vital Signs: There were no vitals taken for this visit.     Physical Exam Targeted exam: Skin: normal Abd: drain in the RUQ.  Silk suture.  No redness or drainage.  Dark bile in the gravity bag.   Mallampati Score:     Imaging: CUP PACEART REMOTE DEVICE CHECK Result Date: 02/15/2024 ILR summary report received. Battery status OK. Normal device function. No new symptom, tachy, brady, or pause episodes. No new AF episodes. Previously reported 6 AF events, most on 3/3 with longest x 3 hrs.  3 beats WCT.  Currently not on OAC per EMR.  No recurrence since.  Monthly summary reports and ROV/PRN [place], CV[facility]  IR CHOLANGIOGRAM EXISTING TUBE Result Date: 02/07/2024 INDICATION: Acute calculus cholecystitis status post CT percutaneous cholecystostomy 01/10/2024 EXAM: Cholecystostomy injection for limited cholangiogram MEDICATIONS: None. ANESTHESIA/SEDATION: None. FLUOROSCOPY: Radiation Exposure Index (as provided by the fluoroscopic device): 22 mGy Kerma COMPLICATIONS: None immediate. PROCEDURE: Contrast was injected through the existing cholecystostomy. Fluoroscopic imaging performed. Cholecystostomy is within the collapsed gallbladder. Two large filling defects noted compatible with cholelithiasis. This correlates with the CT. Cystic duct not visualized compatible with cystic duct obstruction. Stable catheter position. Catheter remains widely patent. IMPRESSION: Stable cholecystostomy catheter position within the now collapsed gallbladder. Obstructing cholelithiasis.  Cystic duct not visualized. Recommend cholecystostomy exchange in 4 weeks. Electronically Signed   By: Judie Petit.  Shick M.D.   On: 02/07/2024 15:14    Labs:  CBC: Recent Labs    07/05/23 0711 07/19/23 0628 09/06/23 1553 02/18/24 1106  WBC 6.3 5.7 8.1 12.7*  HGB 12.9* 12.6* 14.9 13.2  HCT 37.7* 37.0* 44.7 40.4  PLT 214 186 256 205     COAGS: Recent Labs    06/23/23 1648  INR 1.2  APTT 30    BMP: Recent Labs    07/05/23 0711 07/08/23 0648 07/15/23 0735 07/19/23 0628 09/06/23 1553  NA 139 138 142 141 141  K 3.9 4.3 3.9 3.9 4.7  CL 110 110 104 103 102  CO2 21* 21*  26 27 23   GLUCOSE 96 92 106* 94 125*  BUN 26* 18 22 17 16   CALCIUM 8.4* 8.1* 8.9 8.5* 9.4  CREATININE 1.25* 1.07 1.17 1.13 1.74*  GFRNONAA 55* >60 59* >60  --     LIVER FUNCTION TESTS: Recent Labs    06/23/23 1648 07/02/23 0539  BILITOT 0.6 1.5*  AST 30 37  ALT 26 40  ALKPHOS 89 69  PROT 6.0* 5.7*  ALBUMIN 3.5 3.3*    TUMOR MARKERS: No results for input(s): "AFPTM", "CEA", "CA199", "CHROMGRNA" in the last 8760 hours.  Assessment and Plan:  Michael Kidd is 89 yo male presenting for routine exchange of perc chole.   His wife has provided consent for the exchange.  Risks and benefits discussed with the patient's wife including bleeding, infection, damage to adjacent structures, bowel perforation/fistula connection, and sepsis.  All of the patient's questions were answered, patient is agreeable to proceed. Consent signed and in chart.  Will proceed with perc chole exchange.   Plan for the future.  - The patient needs to have this drain changed at the hospital given his limited mobility, his dementia and need for phone consent from his wife/family.   Advise future exchange at hospital.  Local hospital where the drain was placed is the best location, at [facility] at the radiology dept.  Because the drain needs changing only every 2-3 months or so, there is plenty of lead time to make sure there is capable staff available for the exchange.   [facility] Radiology: [phone]  CC copy of this report to [facility]   Electronically Signed: Gilmer Mor 03/06/2024, 9:19 AM   I spent a total of  15 Minutes   in face to face in clinical consultation, greater than 50% of which was  counseling/coordinating care for perc chole drain exchange

## 2024-03-17 NOTE — Addendum Note (Signed)
 Addended by: Geralyn Flash D on: 03/17/2024 02:15 PM   Modules accepted: Orders

## 2024-03-17 NOTE — Progress Notes (Signed)
 [facility] Summary Report / Loop Recorder

## 2024-03-20 ENCOUNTER — Ambulatory Visit (INDEPENDENT_AMBULATORY_CARE_PROVIDER_SITE_OTHER): Payer: Medicare HMO

## 2024-03-20 DIAGNOSIS — I493 Ventricular premature depolarization: Secondary | ICD-10-CM | POA: Diagnosis not present

## 2024-03-20 DIAGNOSIS — I63412 Cerebral infarction due to embolism of left middle cerebral artery: Secondary | ICD-10-CM

## 2024-03-21 LAB — CUP PACEART REMOTE DEVICE CHECK
Date Time Interrogation Session: 20250413230224
Implantable Pulse Generator Implant Date: 20200921

## 2024-03-31 ENCOUNTER — Ambulatory Visit: Admitting: Cardiology

## 2024-05-04 NOTE — Addendum Note (Signed)
 Addended by: Edra Govern D on: 05/04/2024 04:33 PM   Modules accepted: Orders, Level of Service

## 2024-05-04 NOTE — Progress Notes (Signed)
 [facility] Summary Report / Loop Recorder

## 2024-05-07 DEATH — deceased
